# Patient Record
Sex: Female | Born: 1965 | Race: Black or African American | Hispanic: No | Smoking: Never smoker
Health system: Southern US, Community
[De-identification: ages and names within clinical notes are randomized; demographics above are authoritative.]

## PROBLEM LIST (undated history)

## (undated) DIAGNOSIS — E119 Type 2 diabetes mellitus without complications: Secondary | ICD-10-CM

## (undated) DIAGNOSIS — M549 Dorsalgia, unspecified: Secondary | ICD-10-CM

## (undated) DIAGNOSIS — R001 Bradycardia, unspecified: Secondary | ICD-10-CM

## (undated) DIAGNOSIS — I10 Essential (primary) hypertension: Secondary | ICD-10-CM

## (undated) DIAGNOSIS — G8929 Other chronic pain: Secondary | ICD-10-CM

## (undated) DIAGNOSIS — K219 Gastro-esophageal reflux disease without esophagitis: Secondary | ICD-10-CM

## (undated) DIAGNOSIS — E876 Hypokalemia: Secondary | ICD-10-CM

## (undated) HISTORY — PX: HERNIA REPAIR: SHX51

## (undated) HISTORY — DX: Gastro-esophageal reflux disease without esophagitis: K21.9

## (undated) HISTORY — PX: OTHER SURGICAL HISTORY: SHX169

## (undated) HISTORY — PX: APPENDECTOMY: SHX54

## (undated) HISTORY — PX: ESOPHAGOGASTRODUODENOSCOPY: SHX1529

## (undated) HISTORY — PX: BACK SURGERY: SHX140

---

## 2016-03-05 DIAGNOSIS — Z981 Arthrodesis status: Secondary | ICD-10-CM | POA: Insufficient documentation

## 2016-03-05 DIAGNOSIS — G8929 Other chronic pain: Secondary | ICD-10-CM | POA: Insufficient documentation

## 2016-03-05 DIAGNOSIS — E6609 Other obesity due to excess calories: Secondary | ICD-10-CM | POA: Insufficient documentation

## 2016-09-19 DIAGNOSIS — F333 Major depressive disorder, recurrent, severe with psychotic symptoms: Secondary | ICD-10-CM | POA: Insufficient documentation

## 2016-09-19 DIAGNOSIS — N83201 Unspecified ovarian cyst, right side: Secondary | ICD-10-CM | POA: Insufficient documentation

## 2016-09-19 DIAGNOSIS — F319 Bipolar disorder, unspecified: Secondary | ICD-10-CM | POA: Insufficient documentation

## 2016-09-19 DIAGNOSIS — F250 Schizoaffective disorder, bipolar type: Secondary | ICD-10-CM | POA: Insufficient documentation

## 2016-12-10 DIAGNOSIS — Z9889 Other specified postprocedural states: Secondary | ICD-10-CM | POA: Insufficient documentation

## 2016-12-10 DIAGNOSIS — R102 Pelvic and perineal pain: Secondary | ICD-10-CM | POA: Insufficient documentation

## 2016-12-10 DIAGNOSIS — N809 Endometriosis, unspecified: Secondary | ICD-10-CM | POA: Insufficient documentation

## 2016-12-10 DIAGNOSIS — N941 Unspecified dyspareunia: Secondary | ICD-10-CM | POA: Insufficient documentation

## 2017-01-14 DIAGNOSIS — R809 Proteinuria, unspecified: Secondary | ICD-10-CM | POA: Insufficient documentation

## 2017-04-24 DIAGNOSIS — Z780 Asymptomatic menopausal state: Secondary | ICD-10-CM | POA: Insufficient documentation

## 2017-07-11 DIAGNOSIS — E559 Vitamin D deficiency, unspecified: Secondary | ICD-10-CM | POA: Insufficient documentation

## 2017-12-09 DIAGNOSIS — E119 Type 2 diabetes mellitus without complications: Secondary | ICD-10-CM | POA: Diagnosis not present

## 2017-12-24 ENCOUNTER — Emergency Department (HOSPITAL_COMMUNITY): Payer: Medicare Other

## 2017-12-24 ENCOUNTER — Emergency Department (HOSPITAL_COMMUNITY)
Admission: EM | Admit: 2017-12-24 | Discharge: 2017-12-24 | Disposition: A | Discharge status: Home / Self Care | Payer: Medicare Other | Attending: Emergency Medicine | Admitting: Emergency Medicine

## 2017-12-24 ENCOUNTER — Encounter (HOSPITAL_COMMUNITY): Payer: Self-pay | Admitting: Emergency Medicine

## 2017-12-24 DIAGNOSIS — G8929 Other chronic pain: Secondary | ICD-10-CM

## 2017-12-24 DIAGNOSIS — I10 Essential (primary) hypertension: Secondary | ICD-10-CM | POA: Diagnosis not present

## 2017-12-24 DIAGNOSIS — E119 Type 2 diabetes mellitus without complications: Secondary | ICD-10-CM | POA: Insufficient documentation

## 2017-12-24 DIAGNOSIS — M549 Dorsalgia, unspecified: Secondary | ICD-10-CM | POA: Diagnosis present

## 2017-12-24 DIAGNOSIS — M545 Low back pain: Secondary | ICD-10-CM | POA: Insufficient documentation

## 2017-12-24 HISTORY — DX: Essential (primary) hypertension: I10

## 2017-12-24 HISTORY — DX: Dorsalgia, unspecified: M54.9

## 2017-12-24 HISTORY — DX: Other chronic pain: G89.29

## 2017-12-24 HISTORY — DX: Type 2 diabetes mellitus without complications: E11.9

## 2017-12-24 LAB — COMPREHENSIVE METABOLIC PANEL
ALT: 18 U/L (ref 14–54)
AST: 21 U/L (ref 15–41)
Albumin: 4.4 g/dL (ref 3.5–5.0)
Alkaline Phosphatase: 77 U/L (ref 38–126)
Anion gap: 9 (ref 5–15)
BUN: 5 mg/dL — ABNORMAL LOW (ref 6–20)
CO2: 28 mmol/L (ref 22–32)
Calcium: 9.6 mg/dL (ref 8.9–10.3)
Chloride: 101 mmol/L (ref 101–111)
Creatinine, Ser: 0.75 mg/dL (ref 0.44–1.00)
GFR calc Af Amer: >60 mL/min (ref >60)
GFR calc non Af Amer: >60 mL/min (ref >60)
Glucose, Bld: 87 mg/dL (ref 65–99)
Potassium: 3.4 mmol/L — ABNORMAL LOW (ref 3.5–5.1)
Sodium: 138 mmol/L (ref 135–145)
Total Bilirubin: 0.6 mg/dL (ref 0.3–1.2)
Total Protein: 8.1 g/dL (ref 6.5–8.1)

## 2017-12-24 LAB — URINALYSIS, ROUTINE W REFLEX MICROSCOPIC
Bacteria, UA: NONE SEEN
Bilirubin Urine: NEGATIVE
Glucose, UA: NEGATIVE mg/dL
Ketones, ur: NEGATIVE mg/dL
Leukocytes, UA: NEGATIVE
Nitrite: NEGATIVE
Protein, ur: NEGATIVE mg/dL
Specific Gravity, Urine: 1.005 (ref 1.005–1.030)
pH: 7 (ref 5.0–8.0)

## 2017-12-24 LAB — CBC WITH DIFFERENTIAL/PLATELET
Abs Immature Granulocytes: 0 10*3/uL (ref 0.0–0.1)
Basophils Absolute: 0.1 10*3/uL (ref 0.0–0.1)
Basophils Relative: 1 %
Eosinophils Absolute: 0.3 10*3/uL (ref 0.0–0.7)
Eosinophils Relative: 5 %
HCT: 43.3 % (ref 36.0–46.0)
Hemoglobin: 13.9 g/dL (ref 12.0–15.0)
Immature Granulocytes: 0 %
Lymphocytes Relative: 54 %
Lymphs Abs: 3.1 10*3/uL (ref 0.7–4.0)
MCH: 27.5 pg (ref 26.0–34.0)
MCHC: 32.1 g/dL (ref 30.0–36.0)
MCV: 85.7 fL (ref 78.0–100.0)
Monocytes Absolute: 0.3 10*3/uL (ref 0.1–1.0)
Monocytes Relative: 6 %
Neutro Abs: 1.9 10*3/uL (ref 1.7–7.7)
Neutrophils Relative %: 34 %
Platelets: 357 10*3/uL (ref 150–400)
RBC: 5.05 MIL/uL (ref 3.87–5.11)
RDW: 13.6 % (ref 11.5–15.5)
WBC: 5.7 10*3/uL (ref 4.0–10.5)

## 2017-12-24 LAB — I-STAT BETA HCG BLOOD, ED (MC, WL, AP ONLY): I-stat hCG, quantitative: <5 m[IU]/mL (ref <5)

## 2017-12-24 MED ORDER — HYDROMORPHONE HCL 2 MG/ML IJ SOLN
1.0000 mg | Freq: Once | INTRAMUSCULAR | Status: AC
  Administered 2017-12-24: 1 mg via INTRAVENOUS
  Filled 2017-12-24: qty 1

## 2017-12-24 MED ORDER — ACETAMINOPHEN 325 MG PO TABS
650.0000 mg | ORAL_TABLET | Freq: Once | ORAL | Status: AC
  Administered 2017-12-24: 650 mg via ORAL
  Filled 2017-12-24: qty 2

## 2017-12-24 MED ORDER — HYDROMORPHONE HCL 2 MG/ML IJ SOLN
2.0000 mg | Freq: Once | INTRAMUSCULAR | Status: AC
  Administered 2017-12-24: 2 mg via INTRAVENOUS
  Filled 2017-12-24: qty 1
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
## 2017-12-24 NOTE — ED Notes (Signed)
ED Provider at bedside. 

## 2017-12-24 NOTE — ED Triage Notes (Signed)
Patient from home with [facility] with complaint of chronic back pain. Patient has an implant that provides her with fentanyl that was refilled yesterday. Patient states pain started two days ago and has gotten progressively worse.   170/80 100% on room air HR 60 CBG 90

## 2017-12-24 NOTE — ED Notes (Signed)
Placed purewick on pt. 

## 2017-12-24 NOTE — Discharge Instructions (Addendum)
Please follow-up with Dr. Leandra Kern as planned on Friday.  Resume taking your medications at home.  Please return to the emergency department if you develop any new or worsening symptoms including complete numbness of your groin, loss of bowel or bladder control.

## 2018-01-03 ENCOUNTER — Inpatient Hospital Stay (HOSPITAL_COMMUNITY)
Admission: EM | Admit: 2018-01-03 | Discharge: 2018-01-06 | DRG: 093 | Disposition: A | Discharge status: Home / Self Care | Payer: Medicare Other | Attending: Family Medicine | Admitting: Family Medicine

## 2018-01-03 ENCOUNTER — Encounter (HOSPITAL_COMMUNITY): Payer: Self-pay

## 2018-01-03 DIAGNOSIS — Z79899 Other long term (current) drug therapy: Secondary | ICD-10-CM

## 2018-01-03 DIAGNOSIS — W11XXXS Fall on and from ladder, sequela: Secondary | ICD-10-CM | POA: Diagnosis present

## 2018-01-03 DIAGNOSIS — Z7982 Long term (current) use of aspirin: Secondary | ICD-10-CM

## 2018-01-03 DIAGNOSIS — M549 Dorsalgia, unspecified: Secondary | ICD-10-CM | POA: Diagnosis present

## 2018-01-03 DIAGNOSIS — R51 Headache: Secondary | ICD-10-CM | POA: Diagnosis present

## 2018-01-03 DIAGNOSIS — J45909 Unspecified asthma, uncomplicated: Secondary | ICD-10-CM | POA: Diagnosis present

## 2018-01-03 DIAGNOSIS — T85615A Breakdown (mechanical) of other nervous system device, implant or graft, initial encounter: Principal | ICD-10-CM | POA: Diagnosis present

## 2018-01-03 DIAGNOSIS — R202 Paresthesia of skin: Secondary | ICD-10-CM | POA: Diagnosis present

## 2018-01-03 DIAGNOSIS — R262 Difficulty in walking, not elsewhere classified: Secondary | ICD-10-CM | POA: Diagnosis present

## 2018-01-03 DIAGNOSIS — M545 Low back pain: Secondary | ICD-10-CM | POA: Diagnosis not present

## 2018-01-03 DIAGNOSIS — Z793 Long term (current) use of hormonal contraceptives: Secondary | ICD-10-CM

## 2018-01-03 DIAGNOSIS — Y838 Other surgical procedures as the cause of abnormal reaction of the patient, or of later complication, without mention of misadventure at the time of the procedure: Secondary | ICD-10-CM | POA: Diagnosis present

## 2018-01-03 DIAGNOSIS — Z79891 Long term (current) use of opiate analgesic: Secondary | ICD-10-CM

## 2018-01-03 DIAGNOSIS — G8929 Other chronic pain: Secondary | ICD-10-CM | POA: Diagnosis present

## 2018-01-03 DIAGNOSIS — Z7984 Long term (current) use of oral hypoglycemic drugs: Secondary | ICD-10-CM

## 2018-01-03 DIAGNOSIS — E119 Type 2 diabetes mellitus without complications: Secondary | ICD-10-CM

## 2018-01-03 DIAGNOSIS — I10 Essential (primary) hypertension: Secondary | ICD-10-CM | POA: Diagnosis present

## 2018-01-03 DIAGNOSIS — R40241 Glasgow coma scale score 13-15, unspecified time: Secondary | ICD-10-CM | POA: Diagnosis present

## 2018-01-03 DIAGNOSIS — R110 Nausea: Secondary | ICD-10-CM | POA: Diagnosis present

## 2018-01-03 LAB — CBC WITH DIFFERENTIAL/PLATELET
Abs Immature Granulocytes: 0 10*3/uL (ref 0.0–0.1)
Basophils Absolute: 0 10*3/uL (ref 0.0–0.1)
Basophils Relative: 1 %
Eosinophils Absolute: 0.1 10*3/uL (ref 0.0–0.7)
Eosinophils Relative: 2 %
HCT: 43.9 % (ref 36.0–46.0)
Hemoglobin: 14.3 g/dL (ref 12.0–15.0)
Immature Granulocytes: 0 %
Lymphocytes Relative: 44 %
Lymphs Abs: 3.2 10*3/uL (ref 0.7–4.0)
MCH: 27.6 pg (ref 26.0–34.0)
MCHC: 32.6 g/dL (ref 30.0–36.0)
MCV: 84.7 fL (ref 78.0–100.0)
Monocytes Absolute: 0.4 10*3/uL (ref 0.1–1.0)
Monocytes Relative: 5 %
Neutro Abs: 3.7 10*3/uL (ref 1.7–7.7)
Neutrophils Relative %: 50 %
Platelets: 307 10*3/uL (ref 150–400)
RBC: 5.18 MIL/uL — ABNORMAL HIGH (ref 3.87–5.11)
RDW: 13.6 % (ref 11.5–15.5)
WBC: 7.4 10*3/uL (ref 4.0–10.5)

## 2018-01-03 LAB — BASIC METABOLIC PANEL
Anion gap: 13 (ref 5–15)
BUN: 11 mg/dL (ref 6–20)
CO2: 24 mmol/L (ref 22–32)
Calcium: 9.7 mg/dL (ref 8.9–10.3)
Chloride: 104 mmol/L (ref 101–111)
Creatinine, Ser: 0.69 mg/dL (ref 0.44–1.00)
GFR calc Af Amer: >60 mL/min (ref >60)
GFR calc non Af Amer: >60 mL/min (ref >60)
Glucose, Bld: 82 mg/dL (ref 65–99)
Potassium: 4 mmol/L (ref 3.5–5.1)
Sodium: 141 mmol/L (ref 135–145)

## 2018-01-03 LAB — URINALYSIS, ROUTINE W REFLEX MICROSCOPIC
Bilirubin Urine: NEGATIVE
Glucose, UA: NEGATIVE mg/dL
Hgb urine dipstick: NEGATIVE
Ketones, ur: NEGATIVE mg/dL
Nitrite: NEGATIVE
Protein, ur: NEGATIVE mg/dL
Specific Gravity, Urine: 1.006 (ref 1.005–1.030)
pH: 7 (ref 5.0–8.0)

## 2018-01-03 MED ORDER — METOCLOPRAMIDE HCL 5 MG/ML IJ SOLN
10.0000 mg | Freq: Once | INTRAMUSCULAR | Status: AC
  Administered 2018-01-03: 10 mg via INTRAVENOUS
  Filled 2018-01-03: qty 2

## 2018-01-03 MED ORDER — HYDROMORPHONE HCL 2 MG/ML IJ SOLN
2.0000 mg | Freq: Once | INTRAMUSCULAR | Status: AC
  Administered 2018-01-03: 2 mg via INTRAVENOUS
  Filled 2018-01-03: qty 1

## 2018-01-03 MED ORDER — SODIUM CHLORIDE 0.9 % IV BOLUS
1000.0000 mL | Freq: Once | INTRAVENOUS | Status: AC
  Administered 2018-01-03: 1000 mL via INTRAVENOUS

## 2018-01-03 MED ORDER — HYDROMORPHONE HCL 2 MG/ML IJ SOLN
1.0000 mg | Freq: Once | INTRAMUSCULAR | Status: AC
Start: 2018-01-03 — End: 2018-01-03
  Administered 2018-01-03: 1 mg via INTRAVENOUS
  Filled 2018-01-03: qty 1

## 2018-01-03 MED ORDER — ONDANSETRON HCL 4 MG/2ML IJ SOLN
4.0000 mg | Freq: Once | INTRAMUSCULAR | Status: AC
  Administered 2018-01-03: 4 mg via INTRAVENOUS
  Filled 2018-01-03: qty 2

## 2018-01-03 MED ORDER — HYDROMORPHONE HCL 2 MG/ML IJ SOLN
1.0000 mg | Freq: Once | INTRAMUSCULAR | Status: AC
  Administered 2018-01-03: 1 mg via INTRAVENOUS
  Filled 2018-01-03: qty 1

## 2018-01-03 NOTE — ED Notes (Signed)
Pt c/o a headache  She reports that all the pain med she has been given has not helped her headache

## 2018-01-03 NOTE — ED Notes (Signed)
Pt called out and is requesting medication for nausea.
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
## 2018-01-03 NOTE — ED Notes (Signed)
Admitting doctor has been in to see the pt

## 2018-01-03 NOTE — ED Notes (Signed)
Pain no better more pain med given

## 2018-01-03 NOTE — ED Notes (Signed)
Pt resting daughter at the bedside reports that she is no better

## 2018-01-03 NOTE — ED Triage Notes (Signed)
To room via EMS.  Pt has chronic back pain, has implanted Fentanyl pump.  Onset 2 weeks pain is worsening. Pt taking Dilaudid for breakthrough pain, last dose 2:30am.

## 2018-01-03 NOTE — ED Provider Notes (Signed)
[facility] EMERGENCY DEPARTMENT Provider Note   CSN: [id number] Arrival date & time: 01/03/18  1344     History   Chief Complaint Chief Complaint  Patient presents with  . Back Pain    HPI Linda Mercado is a 52 y.o. female.  She is a history of chronic pain and is been dealing with this since 2008 when she fell off a ladder and broke her spine.  She is had multiple back surgeries since then and currently has an intrathecal fentanyl pump.  She follows with Dr. Wilburt Finlay from pain management here.  She was here about 10 days ago for intractable pain not controlled by her regular medications and ended up getting multiple doses of IV Dilaudid for breakthrough pain.  The patient here today is moaning in pain and her family member is present in the history that she has had worsening pain that goes from her head to her feet through her whole back and down her legs.  The patient herself speaks with a very quiet voice and will answer very brief questions.  She denies any fever and is only concerned about the worsening of her 10 out of 10 pain.  There is been no new numbness or weakness.  The history is provided by the patient and a relative. The history is limited by the condition of the patient.  Back Pain   This is a chronic problem. The problem occurs constantly. The problem has been gradually worsening. The pain is associated with no known injury. The pain is present in the thoracic spine, lumbar spine, sacro-iliac joint and gluteal region. The quality of the pain is described as stabbing. The pain radiates to the left thigh, left knee, left foot, right thigh, right knee and right foot. The pain is at a severity of 10/10. The pain is severe. The pain is the same all the time. Associated symptoms include headaches, abdominal pain, paresthesias and weakness. Pertinent negatives include no chest pain, no fever, no bowel incontinence, no perianal numbness, no bladder incontinence and no  dysuria. She has tried analgesics for the symptoms. The treatment provided no relief.    Past Medical History:  Diagnosis Date  . Chronic back pain   . Diabetes mellitus without complication ([place])   . Hypertension     There are no active problems to display for this patient.   History reviewed. No pertinent surgical history.   OB History   None      Home Medications    Prior to Admission medications   Medication Sig Start Date End Date Taking? Authorizing Provider  aspirin EC 81 MG tablet Take 81 mg by mouth daily.   Yes [provider]  atenolol (TENORMIN) 100 MG tablet Take 100 mg by mouth daily.   Yes [provider]  clonazePAM (KLONOPIN) 1 MG tablet Take 1 mg by mouth 3 (three) times daily.   Yes [provider]  etodolac (LODINE) 400 MG tablet Take 400 mg by mouth 2 (two) times daily.   Yes [provider]  hydrochlorothiazide (MICROZIDE) 12.5 MG capsule Take 12.5 mg by mouth daily.   Yes [provider]  hydrOXYzine (ATARAX/VISTARIL) 25 MG tablet Take 25 mg by mouth every 6 (six) hours as needed for itching.   Yes [provider]  losartan (COZAAR) 25 MG tablet Take 25 mg by mouth daily.   Yes [provider]  metFORMIN (GLUCOPHAGE) 500 MG tablet Take 500 mg by mouth 2 (two)  times daily with a meal.   Yes [provider]  Norgestimate-Ethinyl Estradiol Triphasic (TRI-ESTARYLLA) 0.18/0.215/0.25 MG-35 MCG tablet Take 1 tablet by mouth daily.   Yes [provider]  ondansetron (ZOFRAN) 4 MG tablet Take 4 mg by mouth every 6 (six) hours as needed for nausea or vomiting.   Yes [provider]  PRESCRIPTION MEDICATION Fentanyl pump- cont   Yes [provider]  promethazine (PHENERGAN) 25 MG tablet Take 25 mg by mouth every 6 (six) hours as needed for nausea or vomiting.   Yes [provider]  QUEtiapine (SEROQUEL XR) 400 MG 24 hr tablet Take 400 mg by mouth 2 (two) times  daily.   Yes [provider]  tiZANidine (ZANAFLEX) 4 MG tablet Take 4 mg by mouth every 8 (eight) hours as needed for muscle spasms.   Yes [provider]  venlafaxine XR (EFFEXOR-XR) 150 MG 24 hr capsule Take 150 mg by mouth daily with breakfast.   Yes [provider]  oxyCODONE (OXY IR/ROXICODONE) 5 MG immediate release tablet Take 5 mg by mouth 4 (four) times daily. 12/30/17   [provider]    Family History History reviewed. No pertinent family history.  Social History Social History   Tobacco Use  . Smoking status: Never Smoker  . Smokeless tobacco: Never Used  Substance Use Topics  . Alcohol use: Not Currently    Frequency: Never  . Drug use: Never     Allergies   Patient has no known allergies.   Review of Systems Review of Systems  Constitutional: Negative for fever.  HENT: Negative for sore throat.   Eyes: Negative for visual disturbance.  Respiratory: Negative for shortness of breath.   Cardiovascular: Negative for chest pain.  Gastrointestinal: Positive for abdominal pain. Negative for bowel incontinence.  Genitourinary: Negative for bladder incontinence and dysuria.  Musculoskeletal: Positive for arthralgias, back pain, myalgias and neck pain.  Skin: Negative for rash.  Neurological: Positive for weakness, headaches and paresthesias.     Physical Exam Updated Vital Signs BP (!) 165/89   Pulse (!) 58   Temp (!) 97.5 F (36.4 C) (Oral)   Resp 16   LMP 12/01/2017   SpO2 100%   Physical Exam  Constitutional: She appears well-developed and well-nourished.  HENT:  Head: Normocephalic and atraumatic.  Right Ear: External ear normal.  Left Ear: External ear normal.  Nose: Nose normal.  Mouth/Throat: Oropharynx is clear and moist.  Eyes: Conjunctivae are normal.  Neck: Neck supple.  Cardiovascular: Normal rate, regular rhythm, normal heart sounds and intact distal pulses.  Pulmonary/Chest: Effort normal and breath  sounds normal. She has no wheezes. She has no rales.  Abdominal: Soft. There is tenderness (rlq over pump). There is no guarding.  Musculoskeletal: She exhibits tenderness. She exhibits no edema or deformity.  Neurological: She is alert. GCS eye subscore is 4. GCS verbal subscore is 5. GCS motor subscore is 6.  Skin: Skin is warm and dry. Capillary refill takes less than 2 seconds.  Psychiatric: She has a normal mood and affect.     ED Treatments / Results  Labs (all labs ordered are listed, but only abnormal results are displayed) Labs Reviewed  CBC WITH DIFFERENTIAL/PLATELET - Abnormal; Notable for the following components:      Result Value   RBC 5.18 (*)    All other components within normal limits  URINALYSIS, ROUTINE W REFLEX MICROSCOPIC - Abnormal; Notable for the following components:   Leukocytes, UA MODERATE (*)  Bacteria, UA RARE (*)    All other components within normal limits  BASIC METABOLIC PANEL - Abnormal; Notable for the following components:   Potassium 3.4 (*)    Glucose, Bld 124 (*)    Calcium 8.8 (*)    All other components within normal limits  RAPID URINE DRUG SCREEN, HOSP PERFORMED - Abnormal; Notable for the following components:   Opiates POSITIVE (*)    Barbiturates   (*)    Value: Result not available. Reagent lot number recalled by manufacturer.   All other components within normal limits  GLUCOSE, CAPILLARY - Abnormal; Notable for the following components:   Glucose-Capillary 156 (*)    All other components within normal limits  GLUCOSE, CAPILLARY - Abnormal; Notable for the following components:   Glucose-Capillary 146 (*)    All other components within normal limits  BASIC METABOLIC PANEL  CBC  GLUCOSE, CAPILLARY  GLUCOSE, CAPILLARY  GLUCOSE, CAPILLARY  GLUCOSE, CAPILLARY  HIV ANTIBODY (ROUTINE TESTING)    EKG None  Radiology No results found.  Procedures Procedures (including critical care time)  Medications Ordered in  ED Medications  HYDROmorphone (DILAUDID) injection 2 mg (has no administration in time range)     Initial Impression / Assessment and Plan / ED Course  I have reviewed the triage vital signs and the nursing notes.  Pertinent labs & imaging results that were available during my care of the patient were reviewed by me and considered in my medical decision making (see chart for details).  Clinical Course as of Jan 05 1025  Sat Jan 03, 6169  1536 52 year old female with acute on chronic pain.  Per family member she is supposed to go see a Psychologist, sport and exercise at [facility] on Tuesday regarding changing the pump because its in the wrong position.  On review of prior notes she was here 10 days ago and the pump was interrogated and seemed to be functioning appropriately.  Family members asking [place] to talk to Dr. Angie Fava who instructed them to bring her here if she was having any problems.   [MB]  0539 Her current pain management is a fentanyl pump and oxycodone 5 mg every 4 as needed pain.   [MB]  1553 Attempted to reach Dr. Angie Fava -  pain management but his office was closed for the weekend and there is no on-call service.   [MB]  7673 After first dose of pain medicine not any real significant relief.  Family members concern is her time to take her home she is right back here with pain.  We will try a few  doses of pain medicine to see whether or not she is agreeable for that.   [MB]  4193 Reevaluated patient states she is feeling a little less nauseous and a little better pain control.  We will order a repeat dose of each now.   [MB]  2006 Back pain somewhat improved but complaining of headache.   [MB]    Clinical Course User Index [MB] Hayden Rasmussen, MD     Final Clinical Impressions(s) / ED Diagnoses   Final diagnoses:  Chronic low back pain, unspecified back pain laterality, with sciatica presence unspecified    ED Discharge Orders    None       Hayden Rasmussen, MD 01/05/18 1027

## 2018-01-04 ENCOUNTER — Other Ambulatory Visit: Payer: Self-pay

## 2018-01-04 ENCOUNTER — Encounter (HOSPITAL_COMMUNITY): Payer: Self-pay | Admitting: *Deleted

## 2018-01-04 DIAGNOSIS — R262 Difficulty in walking, not elsewhere classified: Secondary | ICD-10-CM | POA: Diagnosis present

## 2018-01-04 DIAGNOSIS — G894 Chronic pain syndrome: Secondary | ICD-10-CM

## 2018-01-04 DIAGNOSIS — I10 Essential (primary) hypertension: Secondary | ICD-10-CM | POA: Diagnosis not present

## 2018-01-04 DIAGNOSIS — Z79891 Long term (current) use of opiate analgesic: Secondary | ICD-10-CM | POA: Diagnosis not present

## 2018-01-04 DIAGNOSIS — Z7984 Long term (current) use of oral hypoglycemic drugs: Secondary | ICD-10-CM | POA: Diagnosis not present

## 2018-01-04 DIAGNOSIS — R202 Paresthesia of skin: Secondary | ICD-10-CM | POA: Diagnosis present

## 2018-01-04 DIAGNOSIS — M549 Dorsalgia, unspecified: Secondary | ICD-10-CM | POA: Diagnosis not present

## 2018-01-04 DIAGNOSIS — R110 Nausea: Secondary | ICD-10-CM | POA: Diagnosis present

## 2018-01-04 DIAGNOSIS — Z793 Long term (current) use of hormonal contraceptives: Secondary | ICD-10-CM | POA: Diagnosis not present

## 2018-01-04 DIAGNOSIS — M545 Low back pain: Secondary | ICD-10-CM | POA: Diagnosis present

## 2018-01-04 DIAGNOSIS — R51 Headache: Secondary | ICD-10-CM | POA: Diagnosis present

## 2018-01-04 DIAGNOSIS — E119 Type 2 diabetes mellitus without complications: Secondary | ICD-10-CM | POA: Diagnosis not present

## 2018-01-04 DIAGNOSIS — R40241 Glasgow coma scale score 13-15, unspecified time: Secondary | ICD-10-CM | POA: Diagnosis present

## 2018-01-04 DIAGNOSIS — T85615A Breakdown (mechanical) of other nervous system device, implant or graft, initial encounter: Secondary | ICD-10-CM | POA: Diagnosis present

## 2018-01-04 DIAGNOSIS — Z7982 Long term (current) use of aspirin: Secondary | ICD-10-CM | POA: Diagnosis not present

## 2018-01-04 DIAGNOSIS — W11XXXS Fall on and from ladder, sequela: Secondary | ICD-10-CM | POA: Diagnosis present

## 2018-01-04 DIAGNOSIS — Z79899 Other long term (current) drug therapy: Secondary | ICD-10-CM | POA: Diagnosis not present

## 2018-01-04 DIAGNOSIS — Y838 Other surgical procedures as the cause of abnormal reaction of the patient, or of later complication, without mention of misadventure at the time of the procedure: Secondary | ICD-10-CM | POA: Diagnosis present

## 2018-01-04 DIAGNOSIS — G8929 Other chronic pain: Secondary | ICD-10-CM | POA: Diagnosis present

## 2018-01-04 DIAGNOSIS — Z9889 Other specified postprocedural states: Secondary | ICD-10-CM | POA: Diagnosis not present

## 2018-01-04 DIAGNOSIS — J45909 Unspecified asthma, uncomplicated: Secondary | ICD-10-CM | POA: Diagnosis present

## 2018-01-04 LAB — GLUCOSE, CAPILLARY
Glucose-Capillary: 77 mg/dL (ref 65–99)
Glucose-Capillary: 91 mg/dL (ref 65–99)
Glucose-Capillary: 87 mg/dL (ref 65–99)
Glucose-Capillary: 156 mg/dL — ABNORMAL HIGH (ref 65–99)
Glucose-Capillary: 146 mg/dL — ABNORMAL HIGH (ref 65–99)

## 2018-01-04 LAB — CBC
HCT: 39.1 % (ref 36.0–46.0)
Hemoglobin: 12.3 g/dL (ref 12.0–15.0)
MCH: 27.3 pg (ref 26.0–34.0)
MCHC: 31.5 g/dL (ref 30.0–36.0)
MCV: 86.7 fL (ref 78.0–100.0)
Platelets: 256 10*3/uL (ref 150–400)
RBC: 4.51 MIL/uL (ref 3.87–5.11)
RDW: 13.7 % (ref 11.5–15.5)
WBC: 8.2 10*3/uL (ref 4.0–10.5)

## 2018-01-04 LAB — BASIC METABOLIC PANEL
Anion gap: 10 (ref 5–15)
BUN: 11 mg/dL (ref 6–20)
CO2: 25 mmol/L (ref 22–32)
Calcium: 8.8 mg/dL — ABNORMAL LOW (ref 8.9–10.3)
Chloride: 103 mmol/L (ref 101–111)
Creatinine, Ser: 0.75 mg/dL (ref 0.44–1.00)
GFR calc Af Amer: >60 mL/min (ref >60)
GFR calc non Af Amer: >60 mL/min (ref >60)
Glucose, Bld: 124 mg/dL — ABNORMAL HIGH (ref 65–99)
Potassium: 3.4 mmol/L — ABNORMAL LOW (ref 3.5–5.1)
Sodium: 138 mmol/L (ref 135–145)

## 2018-01-04 MED ORDER — HYDROMORPHONE HCL 2 MG/ML IJ SOLN
1.0000 mg | INTRAMUSCULAR | Status: DC | PRN
  Administered 2018-01-04: 1 mg via INTRAVENOUS
  Filled 2018-01-04: qty 1

## 2018-01-04 MED ORDER — ONDANSETRON HCL 4 MG/2ML IJ SOLN
4.0000 mg | Freq: Four times a day (QID) | INTRAMUSCULAR | Status: DC | PRN
  Administered 2018-01-04 – 2018-01-06 (×9): 4 mg via INTRAVENOUS
  Filled 2018-01-04 (×9): qty 2

## 2018-01-04 MED ORDER — LOSARTAN POTASSIUM 25 MG PO TABS
25.0000 mg | ORAL_TABLET | Freq: Every day | ORAL | Status: DC
  Administered 2018-01-04 – 2018-01-06 (×3): 25 mg via ORAL
  Filled 2018-01-04 (×3): qty 1

## 2018-01-04 MED ORDER — ATENOLOL 50 MG PO TABS
100.0000 mg | ORAL_TABLET | Freq: Every day | ORAL | Status: DC
  Administered 2018-01-04 – 2018-01-06 (×3): 100 mg via ORAL
  Filled 2018-01-04 (×3): qty 2

## 2018-01-04 MED ORDER — INSULIN ASPART 100 UNIT/ML ~~LOC~~ SOLN
0.0000 [IU] | Freq: Three times a day (TID) | SUBCUTANEOUS | Status: DC
  Administered 2018-01-04: 3 [IU] via SUBCUTANEOUS

## 2018-01-04 MED ORDER — KETOROLAC TROMETHAMINE 15 MG/ML IJ SOLN
15.0000 mg | Freq: Once | INTRAMUSCULAR | Status: AC
  Administered 2018-01-04: 15 mg via INTRAVENOUS
  Filled 2018-01-04: qty 1

## 2018-01-04 MED ORDER — OXYCODONE HCL 5 MG PO TABS
10.0000 mg | ORAL_TABLET | ORAL | Status: DC | PRN
  Administered 2018-01-04: 10 mg via ORAL
  Filled 2018-01-04 (×2): qty 2

## 2018-01-04 MED ORDER — HYDROXYZINE HCL 25 MG PO TABS
25.0000 mg | ORAL_TABLET | Freq: Four times a day (QID) | ORAL | Status: DC | PRN
Start: 2018-01-04 — End: 2018-01-06
  Administered 2018-01-04 – 2018-01-05 (×4): 25 mg via ORAL
  Filled 2018-01-04 (×4): qty 1

## 2018-01-04 MED ORDER — QUETIAPINE FUMARATE ER 400 MG PO TB24
400.0000 mg | ORAL_TABLET | Freq: Two times a day (BID) | ORAL | Status: DC
  Filled 2018-01-04 (×6): qty 1

## 2018-01-04 MED ORDER — ACETAMINOPHEN 325 MG PO TABS
650.0000 mg | ORAL_TABLET | Freq: Four times a day (QID) | ORAL | Status: DC | PRN
  Administered 2018-01-04: 650 mg via ORAL
  Filled 2018-01-04: qty 2

## 2018-01-04 MED ORDER — METFORMIN HCL 500 MG PO TABS
500.0000 mg | ORAL_TABLET | Freq: Two times a day (BID) | ORAL | Status: DC
  Filled 2018-01-04: qty 1

## 2018-01-04 MED ORDER — ASPIRIN EC 81 MG PO TBEC
81.0000 mg | DELAYED_RELEASE_TABLET | Freq: Every day | ORAL | Status: DC
  Administered 2018-01-06: 81 mg via ORAL
  Filled 2018-01-04 (×3): qty 1

## 2018-01-04 MED ORDER — HYDROMORPHONE HCL 1 MG/ML IJ SOLN
1.0000 mg | INTRAMUSCULAR | Status: DC | PRN
  Administered 2018-01-04 – 2018-01-05 (×4): 1 mg via INTRAVENOUS
  Filled 2018-01-04 (×4): qty 1

## 2018-01-04 MED ORDER — ENOXAPARIN SODIUM 40 MG/0.4ML ~~LOC~~ SOLN
40.0000 mg | SUBCUTANEOUS | Status: DC
  Filled 2018-01-04: qty 0.4

## 2018-01-04 MED ORDER — HYDROMORPHONE HCL 1 MG/ML IJ SOLN
1.0000 mg | INTRAMUSCULAR | Status: DC | PRN
  Administered 2018-01-04: 1 mg via INTRAVENOUS
  Filled 2018-01-04: qty 1

## 2018-01-04 MED ORDER — VENLAFAXINE HCL ER 150 MG PO CP24
150.0000 mg | ORAL_CAPSULE | Freq: Every day | ORAL | Status: DC
  Filled 2018-01-04 (×2): qty 1
  Filled 2018-01-04: qty 2
  Filled 2018-01-04: qty 1
  Filled 2018-01-04: qty 2

## 2018-01-04 MED ORDER — MAGNESIUM SULFATE 2 GM/50ML IV SOLN
2.0000 g | Freq: Once | INTRAVENOUS | Status: AC
  Administered 2018-01-04: 2 g via INTRAVENOUS
  Filled 2018-01-04: qty 50

## 2018-01-04 MED ORDER — INSULIN ASPART 100 UNIT/ML ~~LOC~~ SOLN: 0.0000 [IU] | Freq: Every day | SUBCUTANEOUS | Status: DC

## 2018-01-04 MED ORDER — HYDROCHLOROTHIAZIDE 12.5 MG PO CAPS
12.5000 mg | ORAL_CAPSULE | Freq: Every day | ORAL | Status: DC
  Administered 2018-01-04 – 2018-01-06 (×3): 12.5 mg via ORAL
  Filled 2018-01-04 (×4): qty 1

## 2018-01-04 MED ORDER — TIZANIDINE HCL 4 MG PO TABS
4.0000 mg | ORAL_TABLET | Freq: Three times a day (TID) | ORAL | Status: DC | PRN
  Administered 2018-01-04: 4 mg via ORAL
  Filled 2018-01-04: qty 1

## 2018-01-04 MED ORDER — METOCLOPRAMIDE HCL 5 MG/ML IJ SOLN
5.0000 mg | Freq: Once | INTRAMUSCULAR | Status: AC
  Administered 2018-01-04: 5 mg via INTRAVENOUS
  Filled 2018-01-04: qty 2

## 2018-01-04 MED ORDER — CLONAZEPAM 1 MG PO TABS
1.0000 mg | ORAL_TABLET | Freq: Three times a day (TID) | ORAL | Status: DC
  Administered 2018-01-04 – 2018-01-06 (×7): 1 mg via ORAL
  Filled 2018-01-04 (×7): qty 1

## 2018-01-04 MED ORDER — NORGESTIM-ETH ESTRAD TRIPHASIC 0.18/0.215/0.25 MG-35 MCG PO TABS: 1.0000 | ORAL_TABLET | Freq: Every day | ORAL | Status: DC

## 2018-01-04 NOTE — Progress Notes (Signed)
PROGRESS NOTE  Linda Mercado VQM:[id number] DOB: 12-Jul-1966 DOA: 01/03/2018 PCP: Alfredo Martinez, PA Pain Management: Dr. Angie Fava   Brief Narrative: 51 year old woman PMH chronic back pain, status post multiple surgeries, managed by pain pump, recently moved from [place].  Has establish care with anesthesiologist Dr. Angie Fava.  Was seen in the emergency department 6/5 for worsening pain, Dr. Angie Fava he weighed her at that time and recommended discharge home with outpatient follow-up.  Reportedly there is some abnormality with the implantation of her pump possibly loose screws and reportedly the patient has an appointment with a surgeon in [facility] for pump exchange for removal of screws.  In the interim however her pain became more severe in the hospital service was asked to admit the patient for pain control.  Assessment/Plan Intractable back pain with nausea, HA.  No evidence of complicating features. --Continue pain pump, continue oral and IV analgesics for pain as needed.   --Migraine cocktail --will contact Dr Angie Fava in AM  Essential hypertension --Stable.  Continue atenolol, hydrochlorothiazide, losartan  Diabetes mellitus type 2 --Hold metformin while hospitalized  Chronic pain --Continue oxycodone, tizanidine  DVT prophylaxis: SCDs Code Status: Full Family Communication: daughter at bedside Disposition Plan: home    Murray Hodgkins, MD  [facility] Direct contact: [phone] --[place] [place]  --[email or web address]; password [email or web address]  7PM-7AM contact night coverage as above 01/04/2018, 10:13 AM  LOS: 0 days   Consultants:    Procedures:    Antimicrobials:    Interval history/Subjective: Pt reports ongoing RLQ pain at pump site. Nausea for a few weeks. HA for a few weeks.  No problems with bowel or bladder.  No sensory disturbance.  Objective: Vitals:  Vitals:   01/04/18 0145 01/04/18 0520  BP: (!) 170/84 139/73  Pulse: 68 80  Resp:  18  Temp: 98.1  F (36.7 C) 98 F (36.7 C)  SpO2: 98% 99%    Exam:  Constitutional:  . Appears calm and comfortable. Keeps eyes closed during interview and lets daughter answer most questions Eyes:  . pupils and irises appear normal . Normal lids   ENMT:  . grossly normal hearing  . Lips appear normal Respiratory:  . CTA bilaterally, no w/r/r.  . Respiratory effort normal Cardiovascular:  . RRR, no m/r/g . No LE extremity edema   Abdomen:  . Soft, mild RLQ pain, nondistended Musculoskeletal:  . RUE, LUE, RLE, LLE   o strength and tone normal, no atrophy, no abnormal movements o No tenderness, masses o Lifts both legs off bed; effort appears submaximal Psychiatric:  . Mental status o Mood difficult to assess, affect odd  I have personally reviewed the following:   Labs:  Blood sugars stable.  Potassium 3.4, remainder BMP unremarkable  CBC unremarkable  Scheduled Meds: . aspirin EC  81 mg Oral Daily  . atenolol  100 mg Oral Daily  . clonazePAM  1 mg Oral TID  . enoxaparin (LOVENOX) injection  40 mg Subcutaneous Q24H  . hydrochlorothiazide  12.5 mg Oral Daily  . insulin aspart  0-15 Units Subcutaneous TID WC  . insulin aspart  0-5 Units Subcutaneous QHS  . losartan  25 mg Oral Daily  . metFORMIN  500 mg Oral BID WC  . Norgestimate-Ethinyl Estradiol Triphasic  1 tablet Oral Daily  . QUEtiapine  400 mg Oral BID  . venlafaxine XR  150 mg Oral Q breakfast   Continuous Infusions:  Principal Problem:   Intractable back pain Active Problems:   HTN (hypertension)  Type 2 diabetes mellitus without complication (H[facility])   LOS: 0 days

## 2018-01-04 NOTE — Plan of Care (Signed)
  Problem: Safety: Goal: Ability to remain free from injury will improve Outcome: Progressing   

## 2018-01-05 LAB — RAPID URINE DRUG SCREEN, HOSP PERFORMED
Amphetamines: NOT DETECTED
Benzodiazepines: NOT DETECTED
Cocaine: NOT DETECTED
Opiates: POSITIVE — AB
Tetrahydrocannabinol: NOT DETECTED

## 2018-01-05 LAB — GLUCOSE, CAPILLARY
Glucose-Capillary: 89 mg/dL (ref 65–99)
Glucose-Capillary: 120 mg/dL — ABNORMAL HIGH (ref 65–99)
Glucose-Capillary: 107 mg/dL — ABNORMAL HIGH (ref 65–99)
Glucose-Capillary: 113 mg/dL — ABNORMAL HIGH (ref 65–99)

## 2018-01-05 LAB — HIV ANTIBODY (ROUTINE TESTING W REFLEX): HIV Screen 4th Generation wRfx: NONREACTIVE

## 2018-01-05 MED ORDER — OXYCODONE HCL 5 MG PO TABS
10.0000 mg | ORAL_TABLET | ORAL | Status: DC | PRN
Start: 2018-01-05 — End: 2018-01-06
  Administered 2018-01-05 – 2018-01-06 (×2): 10 mg via ORAL
  Filled 2018-01-05 (×2): qty 2

## 2018-01-05 MED ORDER — HYDROMORPHONE HCL 1 MG/ML IJ SOLN
1.0000 mg | INTRAMUSCULAR | Status: DC | PRN
  Administered 2018-01-05 – 2018-01-06 (×4): 1 mg via INTRAVENOUS
  Filled 2018-01-05 (×5): qty 1

## 2018-01-05 NOTE — Progress Notes (Signed)
  PROGRESS NOTE  Linda Mercado [id number]:[id number] DOB: November 01, 1965 DOA: 01/03/2018 PCP: Alfredo Martinez, PA Pain Management: Dr. Angie Fava   Brief Narrative: 52 year old woman PMH chronic back pain, status post multiple surgeries, managed by pain pump, recently moved from [place].  Has establish care with anesthesiologist Dr. Angie Fava.  Was seen in the emergency department 6/5 for worsening pain, Dr. Angie Fava he weighed her at that time and recommended discharge home with outpatient follow-up.  Reportedly there is some abnormality with the implantation of her pump possibly loose screws and reportedly the patient has an appointment with a surgeon in [facility] for pump exchange for removal of screws.  In the interim however her pain became more severe in the hospital service was asked to admit the patient for pain control.  Assessment/Plan Intractable back pain with nausea, HA.  No evidence of complicating features. --pain better controlled with oral, IV analgesic --no concerning neuro features --discussed with Dr. Angie Fava, he recommends no further evaluation, can d/c when able and can f/u with him 6/18, he will interrogate pump again. Pump has been working fine as outpt on previous checks. Will continue pain pump, continue oral and IV analgesics for pain as needed.   --home in AM  Essential hypertension --remains stable. Continue atenolol, hydrochlorothiazide, losartan  Diabetes mellitus type 2. Hold metformin while hospitalized --CBG stable. Continue SSI.  Chronic pain --will continue oxycodone, tizanidine  DVT prophylaxis: SCDs Code Status: Full Family Communication: daughter at bedside 6/17 Disposition Plan: home    Murray Hodgkins, MD  [facility] Direct contact: [phone] --[address]ia [place]  --[email or web address]; password [email or web address]  7PM-7AM contact night coverage as above 01/05/2018, 10:20 AM  LOS: 1 day   Consultants:    Procedures:    Antimicrobials:    Interval  history/Subjective: HA better today. Pain controlled.   Objective: Vitals:  Vitals:   01/04/18 2041 01/05/18 0440  BP: 137/64 124/62  Pulse: (!) 58 (!) 51  Resp:  12  Temp: 97.8 F (36.6 C) 98.9 F (37.2 C)  SpO2: 100% 100%    Exam:  Constitutional:   . Appears calm and comfortable . Remains still as daughter spoon feeds her and later still as daughter rubs skin cream on her face Respiratory:  . CTA bilaterally, no w/r/r.  . Respiratory effort normal.  Cardiovascular:  . RRR, no m/r/g . No LE extremity edema   Abdomen:  . soft Musculoskeletal:  . RLE, LLE   o strength and tone normal, no atrophy, no abnormal movements o Does not appear to give full effort Psychiatric:  . Mental status Affect odd  I have personally reviewed the following:   Labs:  CBG stable  Scheduled Meds: . aspirin EC  81 mg Oral Daily  . atenolol  100 mg Oral Daily  . clonazePAM  1 mg Oral TID  . hydrochlorothiazide  12.5 mg Oral Daily  . insulin aspart  0-15 Units Subcutaneous TID WC  . insulin aspart  0-5 Units Subcutaneous QHS  . losartan  25 mg Oral Daily  . Norgestimate-Ethinyl Estradiol Triphasic  1 tablet Oral Daily  . QUEtiapine  400 mg Oral BID  . venlafaxine XR  150 mg Oral Q breakfast   Continuous Infusions:  Principal Problem:   Intractable back pain Active Problems:   HTN (hypertension)   Type 2 diabetes mellitus without complication ([place])   LOS: 1 day

## 2018-01-05 NOTE — Progress Notes (Signed)
PT Cancellation Note  Patient Details Name: Linda Mercado MRN: [id number] DOB: 1965-08-05   Cancelled Treatment:    Reason Eval/Treat Not Completed: Other (comment).  Pt has attempted to use BSC with nursing and cannot tolerate the activity.  Will try again tomorrow and may dc order if pt is just not able to do this, reorder if so when pt is able.   Ramond Dial 01/05/2018, 9:46 AM   Mee Hives, PT MS Acute Rehab Dept. Number: [place] and [place]

## 2018-01-05 NOTE — Progress Notes (Signed)
Nurse shared this patient is in terrible pain and preparing to be moved to [facility] for surgery due to loose screws/hardware in her body.   Provided calm presence and empathic listening.  Patient is in so much pain-looking for relief from the pain even it does mean surgery.  Prayed with patient and daughter for peace of mind and heart and relief from the pain and guidance for all the medical staff as they seek to help her with these issues.  Conard Novak, Chaplain   01/05/18 1500  Clinical Encounter Type  Visited With Patient and family together  Visit Type Initial;Spiritual support;Other (Comment) (patient in terrible pain)  Referral From Nurse  Consult/Referral To Chaplain  Spiritual Encounters  Spiritual Needs Prayer;Emotional  Stress Factors  Patient Stress Factors Other (Comment) (preparing for transfer to [facility] for surgery-hope to help)

## 2018-01-06 LAB — GLUCOSE, CAPILLARY: Glucose-Capillary: 96 mg/dL (ref 65–99)

## 2018-01-06 NOTE — Discharge Summary (Signed)
Physician Discharge Summary  Linda Mercado:[id number] DOB: 09-Sep-1965 DOA: 01/03/2018  PCP: Alfredo Martinez, PA  Admit date: 01/03/2018 Discharge date: 01/06/2018  Recommendations for Outpatient Follow-up:  1. Follow-up pain pump site 2. Follow-up chronic low back pain 3. Consider psychiatric evaluation for possible depression  Follow-up Information    Shanon Ace, MD Follow up.   Specialty:  Anesthesiology Why:  Call office, they are happy to see you today 6/18 or 6/19 to follow-up on pump. Contact information: [address] [place] [address] [phone]            Discharge Diagnoses:  1. Intractable back pain with nausea, HA 2. Essential hypertension 3. Diabetes mellitus type 2 4. Chronic pain  Discharge Condition: improved Disposition: home   Diet recommendation: heart healthy diabetic diet  Filed Weights   01/04/18 0539  Weight: 61.7 kg (136 lb)    History of present illness:  52 year old woman PMH chronic back pain, status post multiple surgeries, managed by pain pump, recently moved from [place].  Has established care with anesthesiologist Dr. Angie Fava.  Was seen in the emergency department 6/5 for worsening pain, Dr. Angie Fava he weighed her at that time and recommended discharge home with outpatient follow-up.  Reportedly there is some abnormality with the implantation of her pump possibly loose screws and reportedly the patient has an appointment with a surgeon in [facility] for pump exchange for removal of screws.  In the interim however her pain became more severe in the hospital service was asked to admit the patient for pain control.  Hospital Course:  Patient was observed, treated for acute on chronic pain with gradual clinical improvement.  No bowel or bladder dysfunction, no significant weakness or musculoskeletal issues noted.  No new neurologic symptoms noted.  No evidence of complicating features or acute issues.  Case  was discussed with her pain physician, see documentation below.  Headache improved with treatment, pain stabilized, plan for discharge with outpatient follow-up already arranged with her surgeon and can also follow-up with her pain management specialist in the next 24 hours.  Discussed with daughter at bedside 6/18.  Intractable back pain with nausea, HA.  There was no evidence of complicating features. --Pain controlled --discussed with Dr. Angie Fava 6/17, he recommended no further evaluation, can d/c when able and can f/u with him 6/18 or 6/19, he will interrogate pump again. Pump has been working fine as outpt on previous checks. Will continue pain pump, continue oral and IV analgesics for pain as needed.   --I discussed these recommendations with the patient and daughter before noon yesterday and advised him to call the office to arrange an appointment.  They have not yet called the office to arrange an appointment.  Essential hypertension --Stable. Continue atenolol, hydrochlorothiazide, losartan  Diabetes mellitus type 2. Hold metformin while hospitalized --Stable during hospitalization.  Resume metformin on discharge.  Chronic pain --Continue oxycodone, tizanidine  Consultants:  None  Procedures:  None  Today's assessment: S: HA improved. No change in pain at pump site. No bowel or bladder problems. Chronic feet tingling. O: Vitals:  Vitals:   01/05/18 2055 01/06/18 0619  BP: 132/65 106/63  Pulse: 61 (!) 51  Resp: 18 16  Temp: 98.1 F (36.7 C) 97.6 F (36.4 C)  SpO2: 100% 100%    Constitutional:  . Appears calm and comfortable Eyes:  . pupils and irises appear normal . Normal lids  ENMT:  . grossly normal hearing  Respiratory:  .  CTA bilaterally, no w/r/r.  . Respiratory effort normal Cardiovascular:  . RRR, no m/r/g . No LE extremity edema   Musculoskeletal:  RUE, LUE apper normal RLE, LLE tone and strength grossly normal, lifts both legs off the bed to  command, appears to give submaximal effort Skin:  . No rashes, lesions, ulcers Neurologic:  . Sensation lower extremities grossly intact Psychiatric:  . Mental status o Mood appears depressed.  Affect flat.  CBG stable  Discharge Instructions  Discharge Instructions    Diet - low sodium heart healthy   Complete by:  As directed    Diet Carb Modified   Complete by:  As directed    Discharge instructions   Complete by:  As directed    Call your physician or seek immediate medical attention for increased pain, numbness, weakness, bowel or bladder problems or worsening of condition.   Increase activity slowly   Complete by:  As directed      Allergies as of 01/06/2018   No Known Allergies     Medication List    TAKE these medications   aspirin EC 81 MG tablet Take 81 mg by mouth daily.   atenolol 100 MG tablet Commonly known as:  TENORMIN Take 100 mg by mouth daily.   clonazePAM 1 MG tablet Commonly known as:  KLONOPIN Take 1 mg by mouth 3 (three) times daily.   etodolac 400 MG tablet Commonly known as:  LODINE Take 400 mg by mouth 2 (two) times daily.   hydrochlorothiazide 12.5 MG capsule Commonly known as:  MICROZIDE Take 12.5 mg by mouth daily.   hydrOXYzine 25 MG tablet Commonly known as:  ATARAX/VISTARIL Take 25 mg by mouth every 6 (six) hours as needed for itching.   losartan 25 MG tablet Commonly known as:  COZAAR Take 25 mg by mouth daily.   metFORMIN 500 MG tablet Commonly known as:  GLUCOPHAGE Take 500 mg by mouth 2 (two) times daily with a meal.   ondansetron 4 MG tablet Commonly known as:  ZOFRAN Take 4 mg by mouth every 6 (six) hours as needed for nausea or vomiting.   oxyCODONE 5 MG immediate release tablet Commonly known as:  Oxy IR/ROXICODONE Take 5 mg by mouth 4 (four) times daily.   PRESCRIPTION MEDICATION Fentanyl pump- cont   promethazine 25 MG tablet Commonly known as:  PHENERGAN Take 25 mg by mouth every 6 (six) hours as  needed for nausea or vomiting.   QUEtiapine 400 MG 24 hr tablet Commonly known as:  SEROQUEL XR Take 400 mg by mouth 2 (two) times daily.   tiZANidine 4 MG tablet Commonly known as:  ZANAFLEX Take 4 mg by mouth every 8 (eight) hours as needed for muscle spasms.   TRI-ESTARYLLA 0.18/0.215/0.25 MG-35 MCG tablet Generic drug:  Norgestimate-Ethinyl Estradiol Triphasic Take 1 tablet by mouth daily.   venlafaxine XR 150 MG 24 hr capsule Commonly known as:  EFFEXOR-XR Take 150 mg by mouth daily with breakfast.      No Known Allergies  The results of significant diagnostics from this hospitalization (including imaging, microbiology, ancillary and laboratory) are listed below for reference.    Significant Diagnostic Studies:  Labs: Basic Metabolic Panel: Recent Labs  Lab 01/03/18 1554 01/04/18 0651  NA 141 138  K 4.0 3.4*  CL 104 103  CO2 24 25  GLUCOSE 82 124*  BUN 11 11  CREATININE 0.69 0.75  CALCIUM 9.7 8.8*   CBC: Recent Labs  Lab 01/03/18 1554 01/04/18  0651  WBC 7.4 8.2  NEUTROABS 3.7  --   HGB 14.3 12.3  HCT 43.9 39.1  MCV 84.7 86.7  PLT 307 256    CBG: Recent Labs  Lab 01/05/18 0825 01/05/18 1201 01/05/18 1714 01/05/18 2131 01/06/18 0755  GLUCAP 89 120* 107* 113* 96    Principal Problem:   Intractable back pain Active Problems:   HTN (hypertension)   Type 2 diabetes mellitus without complication ([place])   Time coordinating discharge: 25 minutes  Signed:  Murray Hodgkins, MD [facility] 01/06/2018, 8:40 AM

## 2018-03-18 ENCOUNTER — Inpatient Hospital Stay (HOSPITAL_COMMUNITY)
Admission: EM | Admit: 2018-03-18 | Discharge: 2018-03-21 | DRG: 552 | Disposition: A | Discharge status: Home / Self Care | Payer: Medicare Other | Attending: Family Medicine | Admitting: Family Medicine

## 2018-03-18 ENCOUNTER — Encounter (HOSPITAL_COMMUNITY): Payer: Self-pay

## 2018-03-18 ENCOUNTER — Emergency Department (HOSPITAL_COMMUNITY): Payer: Medicare Other

## 2018-03-18 DIAGNOSIS — R296 Repeated falls: Secondary | ICD-10-CM | POA: Diagnosis present

## 2018-03-18 DIAGNOSIS — W06XXXA Fall from bed, initial encounter: Secondary | ICD-10-CM | POA: Diagnosis present

## 2018-03-18 DIAGNOSIS — M549 Dorsalgia, unspecified: Principal | ICD-10-CM | POA: Diagnosis present

## 2018-03-18 DIAGNOSIS — M4056 Lordosis, unspecified, lumbar region: Secondary | ICD-10-CM | POA: Diagnosis present

## 2018-03-18 DIAGNOSIS — I10 Essential (primary) hypertension: Secondary | ICD-10-CM | POA: Diagnosis not present

## 2018-03-18 DIAGNOSIS — Z9689 Presence of other specified functional implants: Secondary | ICD-10-CM | POA: Diagnosis not present

## 2018-03-18 DIAGNOSIS — Z91040 Latex allergy status: Secondary | ICD-10-CM

## 2018-03-18 DIAGNOSIS — Z91048 Other nonmedicinal substance allergy status: Secondary | ICD-10-CM

## 2018-03-18 DIAGNOSIS — F419 Anxiety disorder, unspecified: Secondary | ICD-10-CM | POA: Diagnosis present

## 2018-03-18 DIAGNOSIS — W228XXA Striking against or struck by other objects, initial encounter: Secondary | ICD-10-CM | POA: Diagnosis present

## 2018-03-18 DIAGNOSIS — E119 Type 2 diabetes mellitus without complications: Secondary | ICD-10-CM | POA: Diagnosis not present

## 2018-03-18 DIAGNOSIS — R402140 Coma scale, eyes open, spontaneous, unspecified time: Secondary | ICD-10-CM | POA: Diagnosis not present

## 2018-03-18 DIAGNOSIS — Z9119 Patient's noncompliance with other medical treatment and regimen: Secondary | ICD-10-CM

## 2018-03-18 DIAGNOSIS — R102 Pelvic and perineal pain: Secondary | ICD-10-CM | POA: Diagnosis present

## 2018-03-18 DIAGNOSIS — Z888 Allergy status to other drugs, medicaments and biological substances status: Secondary | ICD-10-CM

## 2018-03-18 DIAGNOSIS — R402250 Coma scale, best verbal response, oriented, unspecified time: Secondary | ICD-10-CM | POA: Diagnosis not present

## 2018-03-18 DIAGNOSIS — Y92003 Bedroom of unspecified non-institutional (private) residence as the place of occurrence of the external cause: Secondary | ICD-10-CM

## 2018-03-18 DIAGNOSIS — Z5329 Procedure and treatment not carried out because of patient's decision for other reasons: Secondary | ICD-10-CM | POA: Diagnosis not present

## 2018-03-18 DIAGNOSIS — E876 Hypokalemia: Secondary | ICD-10-CM | POA: Diagnosis present

## 2018-03-18 DIAGNOSIS — S0990XA Unspecified injury of head, initial encounter: Secondary | ICD-10-CM | POA: Diagnosis not present

## 2018-03-18 DIAGNOSIS — M542 Cervicalgia: Secondary | ICD-10-CM | POA: Diagnosis present

## 2018-03-18 DIAGNOSIS — G894 Chronic pain syndrome: Secondary | ICD-10-CM | POA: Diagnosis not present

## 2018-03-18 DIAGNOSIS — Z79891 Long term (current) use of opiate analgesic: Secondary | ICD-10-CM

## 2018-03-18 DIAGNOSIS — Z7984 Long term (current) use of oral hypoglycemic drugs: Secondary | ICD-10-CM

## 2018-03-18 DIAGNOSIS — Z79899 Other long term (current) drug therapy: Secondary | ICD-10-CM

## 2018-03-18 DIAGNOSIS — E785 Hyperlipidemia, unspecified: Secondary | ICD-10-CM | POA: Diagnosis present

## 2018-03-18 DIAGNOSIS — R310 Gross hematuria: Secondary | ICD-10-CM | POA: Diagnosis present

## 2018-03-18 DIAGNOSIS — Z882 Allergy status to sulfonamides status: Secondary | ICD-10-CM

## 2018-03-18 DIAGNOSIS — R52 Pain, unspecified: Secondary | ICD-10-CM | POA: Diagnosis present

## 2018-03-18 DIAGNOSIS — Z7951 Long term (current) use of inhaled steroids: Secondary | ICD-10-CM

## 2018-03-18 DIAGNOSIS — F319 Bipolar disorder, unspecified: Secondary | ICD-10-CM | POA: Diagnosis present

## 2018-03-18 DIAGNOSIS — R402360 Coma scale, best motor response, obeys commands, unspecified time: Secondary | ICD-10-CM | POA: Diagnosis not present

## 2018-03-18 DIAGNOSIS — R51 Headache: Secondary | ICD-10-CM | POA: Diagnosis present

## 2018-03-18 DIAGNOSIS — R001 Bradycardia, unspecified: Secondary | ICD-10-CM | POA: Diagnosis present

## 2018-03-18 DIAGNOSIS — Z978 Presence of other specified devices: Secondary | ICD-10-CM

## 2018-03-18 HISTORY — DX: Bradycardia, unspecified: R00.1

## 2018-03-18 HISTORY — DX: Hypokalemia: E87.6

## 2018-03-18 LAB — PHOSPHORUS: Phosphorus: 3.6 mg/dL (ref 2.5–4.6)

## 2018-03-18 LAB — BASIC METABOLIC PANEL
Anion gap: 8 (ref 5–15)
BUN: 10 mg/dL (ref 6–20)
CO2: 25 mmol/L (ref 22–32)
Calcium: 8.5 mg/dL — ABNORMAL LOW (ref 8.9–10.3)
Chloride: 108 mmol/L (ref 98–111)
Creatinine, Ser: 0.76 mg/dL (ref 0.44–1.00)
GFR calc Af Amer: >60 mL/min (ref >60)
GFR calc non Af Amer: >60 mL/min (ref >60)
Glucose, Bld: 80 mg/dL (ref 70–99)
Potassium: 3.4 mmol/L — ABNORMAL LOW (ref 3.5–5.1)
Sodium: 141 mmol/L (ref 135–145)

## 2018-03-18 LAB — CBC
HCT: 40.4 % (ref 36.0–46.0)
Hemoglobin: 12.7 g/dL (ref 12.0–15.0)
MCH: 28 pg (ref 26.0–34.0)
MCHC: 31.4 g/dL (ref 30.0–36.0)
MCV: 89 fL (ref 78.0–100.0)
Platelets: 260 10*3/uL (ref 150–400)
RBC: 4.54 MIL/uL (ref 3.87–5.11)
RDW: 14.6 % (ref 11.5–15.5)
WBC: 6.7 10*3/uL (ref 4.0–10.5)

## 2018-03-18 LAB — MAGNESIUM: Magnesium: 1.8 mg/dL (ref 1.7–2.4)

## 2018-03-18 MED ORDER — METHOCARBAMOL 1000 MG/10ML IJ SOLN: 1000.0000 mg | Freq: Once | INTRAMUSCULAR | Status: DC

## 2018-03-18 MED ORDER — POTASSIUM CHLORIDE CRYS ER 20 MEQ PO TBCR
40.0000 meq | EXTENDED_RELEASE_TABLET | Freq: Once | ORAL | Status: AC
  Administered 2018-03-19: 40 meq via ORAL
  Filled 2018-03-18: qty 2

## 2018-03-18 MED ORDER — METHOCARBAMOL 500 MG PO TABS
1000.0000 mg | ORAL_TABLET | Freq: Once | ORAL | Status: AC
  Administered 2018-03-18: 1000 mg via ORAL
  Filled 2018-03-18 (×2): qty 2

## 2018-03-18 MED ORDER — HYDROMORPHONE HCL 1 MG/ML IJ SOLN
2.0000 mg | Freq: Once | INTRAMUSCULAR | Status: DC
  Filled 2018-03-18: qty 2

## 2018-03-18 MED ORDER — ACETAMINOPHEN 325 MG PO TABS
650.0000 mg | ORAL_TABLET | Freq: Once | ORAL | Status: AC
  Administered 2018-03-18: 650 mg via ORAL
  Filled 2018-03-18 (×2): qty 2

## 2018-03-18 MED ORDER — KETOROLAC TROMETHAMINE 30 MG/ML IJ SOLN
30.0000 mg | Freq: Once | INTRAMUSCULAR | Status: AC
  Administered 2018-03-18: 15 mg via INTRAVENOUS
  Filled 2018-03-18: qty 1

## 2018-03-18 MED ORDER — OXYCODONE-ACETAMINOPHEN 5-325 MG PO TABS
1.0000 | ORAL_TABLET | Freq: Once | ORAL | Status: AC
  Administered 2018-03-18: 1 via ORAL
  Filled 2018-03-18 (×2): qty 1

## 2018-03-18 MED ORDER — SODIUM CHLORIDE 0.9 % IV BOLUS
1000.0000 mL | Freq: Once | INTRAVENOUS | Status: AC
  Administered 2018-03-18: 1000 mL via INTRAVENOUS

## 2018-03-18 MED ORDER — HYDROMORPHONE HCL 1 MG/ML IJ SOLN
1.0000 mg | Freq: Once | INTRAMUSCULAR | Status: DC
  Administered 2018-03-18: 1 mg via INTRAVENOUS

## 2018-03-18 MED ORDER — HYDROMORPHONE HCL 1 MG/ML IJ SOLN
1.0000 mg | INTRAMUSCULAR | Status: DC | PRN
  Administered 2018-03-18 – 2018-03-19 (×3): 1 mg via INTRAVENOUS
  Filled 2018-03-18 (×3): qty 1

## 2018-03-18 NOTE — ED Notes (Signed)
Pts IV pump would not program. RN manually programed because of the prescribed rate. When charting intake pump charted 500cc. Pt received 1000cc of NS.

## 2018-03-18 NOTE — ED Triage Notes (Addendum)
Pt presents with report of falling out of bed.  Pt reports she was sitting on side of bed to go to the bathroom, stood up and fell due to her "legs giving out".  Pt has narcotic pump in place, pt requesting that we speak to her daughter for history.  Pt reports hitting her head on nightstand, unsure of LOC.

## 2018-03-18 NOTE — ED Notes (Signed)
Pt's family requested for IV and blood to be drawn from the IV, this NT acknowledged. Notified Julie(RN)

## 2018-03-18 NOTE — ED Notes (Signed)
Pt placed on O2 at 2 lpm via [place] for desaturation during sleep.  Pt reports that she has been having these "episodes" for nearly 3 weeks where she awakens during the night to use the restroom feeling lightheaded and gasping for air.  Primary RN and provider made aware.

## 2018-03-18 NOTE — ED Notes (Signed)
Patient transported to X-ray 

## 2018-03-18 NOTE — ED Notes (Signed)
Daughter reports pt has infusions of clonidine and fentanyl; last updated 3 weeks ago, pt able to give herself 120mcg boluses of fentanyl.

## 2018-03-18 NOTE — ED Notes (Signed)
PO meds offered, pt declined and pt's daughter reports "the last 2 times we've brought her here, we started out with the pills, they don't work and we have to have IV medicine, so that's what we want this time too."  PA-C notified.

## 2018-03-18 NOTE — ED Provider Notes (Signed)
[facility] EMERGENCY DEPARTMENT Provider Note   CSN: [id number] Arrival date & time: 03/18/18  1409     History   Chief Complaint Fall Back pain  HPI Linda Mercado is a 52 y.o. female with a hx of T2DM, HTN, and chronic back pain with trans-thecal pain pump in place who arrives to the ED via EMS s/p fall which occurred shortly prior to arrival with complaints of head, neck, and back pain.  Patient has chronic back pain managed by pain management with fentanyl & clonidine scheduled infusions, she additionally takes PO PRN oxycodone.  She states that she has some problems with her legs giving out, this happened 2 weeks ago resulting in a fall and again today resulting in another fall.  Today she was transitioning from sitting to standing and began to walk when the legs gave out, states that she fell to the side and hit her head, she is unsure if she had loss of consciousness.  She states she is having pain to her head, neck, and entire back.  Pain is a 10 out of 10 in severity.  She has not had her scheduled oxycodone this afternoon, did have her morning dose.  Reports baseline paresthesias and weakness to bilateral lower extremities, unchanged at present. Additionally pain radiates to bilatera lower extremities. She has had some mild nausea without vomiting.  Denies change in vision, vomiting, abdominal pain, shortness of breath, dizziness, or lightheadedness.  Denies incontinence, fever, or hx of cancer or IVDU. Ambulates with a cane at baseline, has not been able to ambulate last few days secondary to pain.  Her daughter is at bedside assisted in providing history.  They believe her narcotic pain pump is functioning appropriately given they are able to bolus.    HPI  Past Medical History:  Diagnosis Date  . Chronic back pain   . Diabetes mellitus without complication ([place])   . Hypertension     Patient Active Problem List   Diagnosis Date Noted  . Intractable back  pain 01/03/2018  . HTN (hypertension) 01/03/2018  . Type 2 diabetes mellitus without complication ([place]) 31/51/7616    History reviewed. No pertinent surgical history.   OB History   None      Home Medications    Prior to Admission medications   Medication Sig Start Date End Date Taking? Authorizing Provider  aspirin EC 81 MG tablet Take 81 mg by mouth daily.    [provider]  atenolol (TENORMIN) 100 MG tablet Take 100 mg by mouth daily.    [provider]  clonazePAM (KLONOPIN) 1 MG tablet Take 1 mg by mouth 3 (three) times daily.    [provider]  etodolac (LODINE) 400 MG tablet Take 400 mg by mouth 2 (two) times daily.    [provider]  hydrochlorothiazide (MICROZIDE) 12.5 MG capsule Take 12.5 mg by mouth daily.    [provider]  hydrOXYzine (ATARAX/VISTARIL) 25 MG tablet Take 25 mg by mouth every 6 (six) hours as needed for itching.    [provider]  losartan (COZAAR) 25 MG tablet Take 25 mg by mouth daily.    [provider]  metFORMIN (GLUCOPHAGE) 500 MG tablet Take 500 mg by mouth 2 (two) times daily with a meal.    [provider]  Norgestimate-Ethinyl Estradiol Triphasic (TRI-ESTARYLLA) 0.18/0.215/0.25 MG-35 MCG tablet Take 1 tablet by mouth daily.    [provider]  ondansetron (ZOFRAN) 4 MG tablet Take 4  mg by mouth every 6 (six) hours as needed for nausea or vomiting.    [provider]  oxyCODONE (OXY IR/ROXICODONE) 5 MG immediate release tablet Take 5 mg by mouth 4 (four) times daily. 12/30/17   [provider]  PRESCRIPTION MEDICATION Fentanyl pump- cont    [provider]  promethazine (PHENERGAN) 25 MG tablet Take 25 mg by mouth every 6 (six) hours as needed for nausea or vomiting.    [provider]  QUEtiapine (SEROQUEL XR) 400 MG 24 hr tablet Take 400 mg by mouth 2 (two) times daily.    [provider]  tiZANidine (ZANAFLEX) 4 MG  tablet Take 4 mg by mouth every 8 (eight) hours as needed for muscle spasms.    [provider]  venlafaxine XR (EFFEXOR-XR) 150 MG 24 hr capsule Take 150 mg by mouth daily with breakfast.    [provider]    Family History History reviewed. No pertinent family history.  Social History Social History   Tobacco Use  . Smoking status: Never Smoker  . Smokeless tobacco: Never Used  Substance Use Topics  . Alcohol use: Not Currently    Frequency: Never  . Drug use: Never     Allergies   Patient has no known allergies.   Review of Systems Review of Systems  All other systems reviewed and are negative.    Physical Exam Updated Vital Signs BP (!) 158/96 (BP Location: Right Arm)   Pulse (!) 58   Temp 98.2 F (36.8 C) (Oral)   Resp (!) 22   LMP 03/11/2018 (Exact Date)   SpO2 100%   Physical Exam  Constitutional: She appears well-developed and well-nourished.  Non-toxic appearance. She appears distressed (mild secondary to discomfort. ).  HENT:  Head: Normocephalic and atraumatic. Head is without raccoon's eyes and without Battle's sign.  Right Ear: No hemotympanum.  Left Ear: No hemotympanum.  Nose: No rhinorrhea.  Mouth/Throat: Uvula is midline and oropharynx is clear and moist.  Eyes: Pupils are equal, round, and reactive to light. Conjunctivae and EOM are normal. Right eye exhibits no discharge. Left eye exhibits no discharge.  Neck: Spinous process tenderness (diffuse, non focal) and muscular tenderness present.  NO palpable step off/crepitus  Cardiovascular: Normal rate and regular rhythm.  No murmur heard. Pulses:      Radial pulses are 2+ on the right side, and 2+ on the left side.       Dorsalis pedis pulses are 2+ on the right side, and 2+ on the left side.  Pulmonary/Chest: Effort normal and breath sounds normal. No respiratory distress. She has no wheezes. She has no rales.  Abdominal: Soft. She exhibits no distension. There is no  tenderness.  Musculoskeletal:  No obvious deformity, appreciable swelling, erythema, ecchymosis, or open wounds. Upper extremities patient has full range of motion at all joints.  She is diffusely tender over the posterior right shoulder without point/focal bony tenderness.  Upper extremities are otherwise nontender Back: Patient is diffusely tender to the lower thoracic and entire lumbar region including midline and bilateral paraspinal muscles.  No point/focal vertebral tenderness.  No palpable bony step-off. Lower extremities: Limited range of motion secondary to pain at the hips, she is able to move her knees and ankles somewhat.  No point/focal bony tenderness to the lower extremities.  Neurological:  Alert.  Clear speech.  CN III through XII grossly intact.  Symmetric intact grip strength.  Symmetric intact strength with ankle plantar/dorsiflexion and knee extension  bilaterally.  Patient sensation is intact upper and lower extremities, she does report her baseline paresthesias of the lower extremities.  Skin: Skin is warm and dry. No rash noted.  Psychiatric: She has a normal mood and affect. Her behavior is normal.  Nursing note and vitals reviewed.   ED Treatments / Results  Labs (all labs ordered are listed, but only abnormal results are displayed) Labs Reviewed  BASIC METABOLIC PANEL - Abnormal; Notable for the following components:      Result Value   Potassium 3.4 (*)    Calcium 8.5 (*)    All other components within normal limits  CBC  MAGNESIUM  PHOSPHORUS    EKG None  Radiology Dg Thoracic Spine 2 View  Result Date: 03/18/2018 CLINICAL DATA:  Multiple recent falls. Pain. For prior spine surgeries. EXAM: THORACIC SPINE 2 VIEWS; LUMBAR SPINE - COMPLETE 4+ VIEW COMPARISON:  Thoracic and lumbar spine radiograph December 24, 2017 FINDINGS: Thoracic spine: There is no evidence of thoracic spine fracture. Alignment is normal. No other significant bone abnormalities are identified.  Punctate metallic foreign body projecting at T8 canal, unchanged. Lumbar spine: Five non rib-bearing lumbar-type vertebral bodies are intact. No malalignment. Maintained lumbar lordosis. Status post L5-S1 PLIF with arthrodesis. Intact hardware without periprosthetic lucency. Moderate L4-5 facet arthropathy. No destructive bony lesions. Sacroiliac joints are symmetric. Included prevertebral and paraspinal soft tissue planes are non-suspicious. Battery stimulator plaque projects over RIGHT hip. Coil material projecting in lower abdomen most compatible with herniorrhaphy. IMPRESSION: Thoracic spine: No fracture deformity, malalignment or dance degenerative change for age. Lumbar spine: No fracture deformity or malalignment. L5-S1 PLIF with arthrodesis. Electronically Signed   By: Elon Alas M.D.   On: 03/18/2018 17:50   Dg Lumbar Spine Complete  Result Date: 03/18/2018 CLINICAL DATA:  Multiple recent falls. Pain. For prior spine surgeries. EXAM: THORACIC SPINE 2 VIEWS; LUMBAR SPINE - COMPLETE 4+ VIEW COMPARISON:  Thoracic and lumbar spine radiograph December 24, 2017 FINDINGS: Thoracic spine: There is no evidence of thoracic spine fracture. Alignment is normal. No other significant bone abnormalities are identified. Punctate metallic foreign body projecting at T8 canal, unchanged. Lumbar spine: Five non rib-bearing lumbar-type vertebral bodies are intact. No malalignment. Maintained lumbar lordosis. Status post L5-S1 PLIF with arthrodesis. Intact hardware without periprosthetic lucency. Moderate L4-5 facet arthropathy. No destructive bony lesions. Sacroiliac joints are symmetric. Included prevertebral and paraspinal soft tissue planes are non-suspicious. Battery stimulator plaque projects over RIGHT hip. Coil material projecting in lower abdomen most compatible with herniorrhaphy. IMPRESSION: Thoracic spine: No fracture deformity, malalignment or dance degenerative change for age. Lumbar spine: No fracture  deformity or malalignment. L5-S1 PLIF with arthrodesis. Electronically Signed   By: Elon Alas M.D.   On: 03/18/2018 17:50   Dg Shoulder Right  Result Date: 03/18/2018 CLINICAL DATA:  Multiple recent falls, fell today, fell onto LEFT side, superior RIGHT shoulder pain EXAM: RIGHT SHOULDER - 2+ VIEW COMPARISON:  None FINDINGS: Osseous mineralization normal. AC joint alignment normal. No acute fracture, dislocation or bone destruction. Visualized ribs unremarkable. IMPRESSION: Normal exam. Electronically Signed   By: Lavonia Dana M.D.   On: 03/18/2018 17:53   Ct Head Wo Contrast  Result Date: 03/18/2018 CLINICAL DATA:  52 year old female with a history of fall EXAM: CT HEAD WITHOUT CONTRAST CT CERVICAL SPINE WITHOUT CONTRAST TECHNIQUE: Multidetector CT imaging of the head and cervical spine was performed following the standard protocol without intravenous contrast. Multiplanar CT image reconstructions of the cervical spine were also generated. COMPARISON:  None. FINDINGS: CT HEAD FINDINGS Brain: No acute intracranial hemorrhage. No midline shift or mass effect. Gray-white differentiation maintained. Unremarkable appearance of the ventricular system. Focal hypodensity in the right basal ganglia, either a perivascular space or region of focal demyelination. Vascular: Unremarkable. Skull: No acute fracture.  No aggressive bone lesion identified. Sinuses/Orbits: Unremarkable appearance of the orbits. Mastoid air cells clear. No middle ear effusion. No significant sinus disease. Other: None CT CERVICAL SPINE FINDINGS Alignment: Apex right curvature, potentially positional. No subluxation. No anterolisthesis or retrolisthesis. Reversal the normal cervical lordosis. Skull base and vertebrae: No skull base fracture. Vertebral bodies demonstrate no acute fracture. No facet fracture. Facets maintain alignment. Soft tissues and spinal canal: No canal hematoma. Unremarkable appearance of the soft tissues of the  cervical region. Disc levels:  Disc space narrowing throughout the cervical spine. Posterior disc osteophyte complex at C3-C4 without significant canal narrowing. Posterior disc osteophyte complex at C5-C6 without significant canal narrowing. Posterior disc osteophyte complex at C6-C7, eccentric to the right with right-sided uncovertebral joint disease contributing to right foraminal narrowing. Upper chest: Unremarkable Other: None IMPRESSION: Head CT: No acute intracranial abnormality. Cervical CT: No acute fracture or malalignment of the cervical spine. Mild degenerative disc disease, with mild right foraminal narrowing at C6-C7 secondary to uncovertebral joint disease. Electronically Signed   By: Corrie Mckusick D.O.   On: 03/18/2018 16:28   Ct Cervical Spine Wo Contrast  Result Date: 03/18/2018 CLINICAL DATA:  52 year old female with a history of fall EXAM: CT HEAD WITHOUT CONTRAST CT CERVICAL SPINE WITHOUT CONTRAST TECHNIQUE: Multidetector CT imaging of the head and cervical spine was performed following the standard protocol without intravenous contrast. Multiplanar CT image reconstructions of the cervical spine were also generated. COMPARISON:  None. FINDINGS: CT HEAD FINDINGS Brain: No acute intracranial hemorrhage. No midline shift or mass effect. Gray-white differentiation maintained. Unremarkable appearance of the ventricular system. Focal hypodensity in the right basal ganglia, either a perivascular space or region of focal demyelination. Vascular: Unremarkable. Skull: No acute fracture.  No aggressive bone lesion identified. Sinuses/Orbits: Unremarkable appearance of the orbits. Mastoid air cells clear. No middle ear effusion. No significant sinus disease. Other: None CT CERVICAL SPINE FINDINGS Alignment: Apex right curvature, potentially positional. No subluxation. No anterolisthesis or retrolisthesis. Reversal the normal cervical lordosis. Skull base and vertebrae: No skull base fracture. Vertebral  bodies demonstrate no acute fracture. No facet fracture. Facets maintain alignment. Soft tissues and spinal canal: No canal hematoma. Unremarkable appearance of the soft tissues of the cervical region. Disc levels:  Disc space narrowing throughout the cervical spine. Posterior disc osteophyte complex at C3-C4 without significant canal narrowing. Posterior disc osteophyte complex at C5-C6 without significant canal narrowing. Posterior disc osteophyte complex at C6-C7, eccentric to the right with right-sided uncovertebral joint disease contributing to right foraminal narrowing. Upper chest: Unremarkable Other: None IMPRESSION: Head CT: No acute intracranial abnormality. Cervical CT: No acute fracture or malalignment of the cervical spine. Mild degenerative disc disease, with mild right foraminal narrowing at C6-C7 secondary to uncovertebral joint disease. Electronically Signed   By: Corrie Mckusick D.O.   On: 03/18/2018 16:28   Ct Thoracic Spine Wo Contrast  Result Date: 03/18/2018 CLINICAL DATA:  [place] from bed.  History of narcotic pump. EXAM: CT THORACIC AND LUMBAR SPINE WITHOUT CONTRAST TECHNIQUE: Multidetector CT imaging of the thoracic and lumbar spine was performed without contrast. Multiplanar CT image reconstructions were also generated. COMPARISON:  Thoracal lumbar spine radiographs March 18, 2018 FINDINGS: CT THORACIC SPINE FINDINGS  ALIGNMENT: Maintained thoracic kyphosis. No malalignment. VERTEBRAE: Vertebral bodies and posterior elements are intact. Intervertebral disc heights preserved, mild ventral endplate spurring. No destructive bony lesions. PARASPINAL AND OTHER SOFT TISSUES: Nonacute. Heterogeneous thyroid without dominant nodule. DISC LEVELS: Contiguous epidural catheter with tip at posterior T9 ventral canal. No disc bulge, canal stenosis nor neural foraminal narrowing. CT LUMBAR SPINE FINDINGS SEGMENTATION: For the purposes of this report the last well-formed intervertebral disc space is  reported as L5-S1. ALIGNMENT: Maintained lumbar lordosis. No malalignment. Mild broad dextroscoliosis on this nonweightbearing examination. VERTEBRAE: Vertebral bodies and posterior elements are intact. Status post L5-S1 PLIF with intact well-seated hardware. Incorporated posterior bone graft material. L5-S1 arthrosis. Nonsurgically altered disc heights preserved. No destructive bony lesions. PARASPINAL AND OTHER SOFT TISSUES: Contiguous epidural catheter via LEFT L2-3 interlaminar approach. Punctate RIGHT lower pole nephrolithiasis. At least small volume retained large bowel stool. DISC LEVELS: L1-2: No disc bulge, canal stenosis nor neural foraminal narrowing. L2-3: No disc bulge. Mild LEFT facet arthropathy without canal stenosis or neural foraminal narrowing. L3-4: Small broad-based disc bulge. Mild RIGHT and moderate LEFT facet arthropathy and ligamentum flavum redundancy without canal stenosis or neural foraminal narrowing. L4-5: Small broad-based disc bulge, small LEFT subarticular disc protrusion. Mild facet arthropathy without canal stenosis. Mild LEFT neural foraminal narrowing. L5-S1: PLIF. No canal stenosis. Severe osseous LEFT neural foraminal narrowing. IMPRESSION: CT THORACIC SPINE IMPRESSION 1. No fracture, malalignment or advanced degenerative change. 2. Epidural catheter tip at T9. CT LUMBAR SPINE IMPRESSION 1. No fracture or malalignment.  L5-S1 PLIF with arthrodesis. 2. Epidural catheter via LEFT L2-3 interlaminar approach. 3. No canal stenosis.  Severe LEFT L5-S1 neural foraminal narrowing. Electronically Signed   By: Elon Alas M.D.   On: 03/18/2018 20:10   Ct Lumbar Spine Wo Contrast  Result Date: 03/18/2018 CLINICAL DATA:  [place] from bed.  History of narcotic pump. EXAM: CT THORACIC AND LUMBAR SPINE WITHOUT CONTRAST TECHNIQUE: Multidetector CT imaging of the thoracic and lumbar spine was performed without contrast. Multiplanar CT image reconstructions were also generated.  COMPARISON:  Thoracal lumbar spine radiographs March 18, 2018 FINDINGS: CT THORACIC SPINE FINDINGS ALIGNMENT: Maintained thoracic kyphosis. No malalignment. VERTEBRAE: Vertebral bodies and posterior elements are intact. Intervertebral disc heights preserved, mild ventral endplate spurring. No destructive bony lesions. PARASPINAL AND OTHER SOFT TISSUES: Nonacute. Heterogeneous thyroid without dominant nodule. DISC LEVELS: Contiguous epidural catheter with tip at posterior T9 ventral canal. No disc bulge, canal stenosis nor neural foraminal narrowing. CT LUMBAR SPINE FINDINGS SEGMENTATION: For the purposes of this report the last well-formed intervertebral disc space is reported as L5-S1. ALIGNMENT: Maintained lumbar lordosis. No malalignment. Mild broad dextroscoliosis on this nonweightbearing examination. VERTEBRAE: Vertebral bodies and posterior elements are intact. Status post L5-S1 PLIF with intact well-seated hardware. Incorporated posterior bone graft material. L5-S1 arthrosis. Nonsurgically altered disc heights preserved. No destructive bony lesions. PARASPINAL AND OTHER SOFT TISSUES: Contiguous epidural catheter via LEFT L2-3 interlaminar approach. Punctate RIGHT lower pole nephrolithiasis. At least small volume retained large bowel stool. DISC LEVELS: L1-2: No disc bulge, canal stenosis nor neural foraminal narrowing. L2-3: No disc bulge. Mild LEFT facet arthropathy without canal stenosis or neural foraminal narrowing. L3-4: Small broad-based disc bulge. Mild RIGHT and moderate LEFT facet arthropathy and ligamentum flavum redundancy without canal stenosis or neural foraminal narrowing. L4-5: Small broad-based disc bulge, small LEFT subarticular disc protrusion. Mild facet arthropathy without canal stenosis. Mild LEFT neural foraminal narrowing. L5-S1: PLIF. No canal stenosis. Severe osseous LEFT neural foraminal narrowing. IMPRESSION: CT THORACIC  SPINE IMPRESSION 1. No fracture, malalignment or advanced  degenerative change. 2. Epidural catheter tip at T9. CT LUMBAR SPINE IMPRESSION 1. No fracture or malalignment.  L5-S1 PLIF with arthrodesis. 2. Epidural catheter via LEFT L2-3 interlaminar approach. 3. No canal stenosis.  Severe LEFT L5-S1 neural foraminal narrowing. Electronically Signed   By: Elon Alas M.D.   On: 03/18/2018 20:10    Procedures Procedures (including critical care time)  Medications Ordered in ED Medications  HYDROmorphone (DILAUDID) injection 1 mg (1 mg Intravenous Given 03/18/18 2125)  oxyCODONE-acetaminophen (PERCOCET/ROXICET) 5-325 MG per tablet 1 tablet (1 tablet Oral Given 03/18/18 2107)  acetaminophen (TYLENOL) tablet 650 mg (650 mg Oral Given 03/18/18 2106)  methocarbamol (ROBAXIN) tablet 1,000 mg (1,000 mg Oral Given 03/18/18 2106)  ketorolac (TORADOL) 30 MG/ML injection 30 mg (15 mg Intravenous Given 03/18/18 1912)  sodium chloride 0.9 % bolus 1,000 mL (1,000 mLs Intravenous New Bag/Given 03/18/18 2028)     Initial Impression / Assessment and Plan / ED Course  I have reviewed the triage vital signs and the nursing notes.  Pertinent labs & imaging results that were available during my care of the patient were reviewed by me and considered in my medical decision making (see chart for details).   Patient presents to the emergency department status post fall with acute on chronic back pain. Patient appears somewhat uncomfortable, nontoxic appearing.  She is diffusely tender in the cervical, thoracic, and lumbar regions without point/focal vertebral tenderness or palpable step-off.  She does have symmetric strength and sensation to the upper and lower extremities, reports baseline decreased sensation to the lower extremity due to paresthesias, able to give good strength at the ankle and the knee bilaterally. Given strength/sensation intact, no urinary incontinence/retention, doubt cauda equina syndrome. Patient afebrile, acute on chronic pain, doubt epidural abscess.  Will trial patient's at home dose of oxycodone with additional Robaxin, and Tylenol, hold off on NSAIDs until CT head resulted, re-eval to determine if IV narcotics are necessary.  Initial imaging included CT head and CT cervical spine as well as plain films of the thoracic/lumbar regions and R shoulder. Discussed with supervising physician Dr. Venora Maples- in agreement.   CT head negative for acute abnormalities.  Cervical CT as well as thoracic and lumbar plain films negative for acute fracture or dislocation.  Patient refused PO medications, stating that only IV medications are effective for her. Further discussed with Dr. Venora Maples who personally evaluated/examined patient- will give IV Dilaudid and encouraged patient to trial PO meds in addition to this. Will also give Toradol given negative CT of the head. Further evaluation with basic labs and CT thoracic/lumbar region.   CT scans without fracture or canal stenosis. Epidural catheter tips in place. Labs fairly unremarkable- mild hypokalemia and hypocalcemia at 3.4 and 8.5 repsectively.   22:20: RE-EVAL: Patient remains uncomfortable, states she has not had significant change in her pain while in the ED. She has received Toradol, Robaxin, Tylenol, Percocet, and Dilaudid x 3. Given continued pain will plan for admission for pain control.   23:02: CONSULT: Discussed case with hospitalist Dr. Roel Cluck, accepts admission.    Final Clinical Impressions(s) / ED Diagnoses   Final diagnoses:  Intractable back pain    ED Discharge Orders    None       Leafy Kindle 03/18/18 2314    Jola Schmidt, MD 03/18/18 2316

## 2018-03-18 NOTE — H&P (Signed)
Linda Mercado HWT:[id number] DOB: [birth year] DOA: 03/18/2018     PCP: Alfredo Martinez, PA  [facility]: Sharee Holster, MD - 06/06/2016  [address], [place], [place], [place] Phone: [phone] Fax: [phone]    Patient arrived to ER on 03/18/18 at 1409  Patient coming from: home Lives   With family    Chief Complaint: fall HPI: Linda Mercado is a 52 y.o. female with medical history significant of DM 2, HTN, chronic back pain sp intrathecal pump  Chronic pelvic pain  Presented with fall that occurred today patient sit up on the side of her bed to go to bathroom try to stand up and fell down because her legs gave out hit her head on the nightstand not sure if she passed out or not.  Patient has chronic intrathecal pump with fentanyl and clonidine scheduled infusions as well as will PRN oxycodone 2 weeks ago she had similar fall also because of her legs giving down she presented to emergency department complaining of head neck and back pain 10 out of 10 severity she is skipped her oxycodone dose in afternoon She has been a bit sedated lately Reports she supposed to use a walker but forgot.   Regarding pertinent Chronic problems: history of hypertension on atenolol  and losartan Regarding chronic pain -  While in ER:  Patient at first to receive her home dose of oxycodone and Robaxin CT head/neck thoracic and lumbar plain films negative for fractures or acute abnormalities Continue to have pain at which point she was given IV Dilaudid and Toradol. She continued to be uncomfortable pain seems to be not controlled by   IV Dilaudid.  The following Work up has been ordered so far:  Orders Placed This Encounter  Procedures  . CT Cervical Spine Wo Contrast  . CT Head Wo Contrast  . DG Thoracic Spine 2 View  . DG Lumbar Spine Complete  . DG Shoulder Right  . CT Thoracic Spine Wo Contrast  . CT Lumbar Spine Wo Contrast  .  CBC  . Basic metabolic panel  . Apply heat to affected area  . Consult to hospitalist    Following Medications were ordered in ER: Medications  HYDROmorphone (DILAUDID) injection 1 mg (1 mg Intravenous Given 03/18/18 2125)  potassium chloride SA (K-DUR,KLOR-CON) CR tablet 40 mEq (has no administration in time range)  oxyCODONE-acetaminophen (PERCOCET/ROXICET) 5-325 MG per tablet 1 tablet (1 tablet Oral Given 03/18/18 2107)  acetaminophen (TYLENOL) tablet 650 mg (650 mg Oral Given 03/18/18 2106)  methocarbamol (ROBAXIN) tablet 1,000 mg (1,000 mg Oral Given 03/18/18 2106)  ketorolac (TORADOL) 30 MG/ML injection 30 mg (15 mg Intravenous Given 03/18/18 1912)  sodium chloride 0.9 % bolus 1,000 mL (0 mLs Intravenous Stopped 03/18/18 2059)    Significant initial  Findings: Abnormal Labs Reviewed  BASIC METABOLIC PANEL - Abnormal; Notable for the following components:      Result Value   Potassium 3.4 (*)    Calcium 8.5 (*)    All other components within normal limits     Na 141 K 3.4  Cr   stable,   Lab Results  Component Value Date   CREATININE 0.76 03/18/2018   CREATININE 0.75 01/04/2018   CREATININE 0.69 01/03/2018      WBC  6.7  HG/HCT  stable,      Component Value Date/Time   HGB 12.7 03/18/2018 2019   HCT 40.4 03/18/2018 2019  UA  not ordered   CT HEAD/neck -  NON acute Right shoulder - no fracture Plain imaging lumbar spine non-acute Imaging thoracic spine non-acute CT lumbar spine /Thoracic spine nonacute CXR -  NON acute      ECG:  Not obtained   ED Triage Vitals  Enc Vitals Group     BP 03/18/18 1418 (!) 158/96     Pulse Rate 03/18/18 1418 (!) 58     Resp 03/18/18 1418 (!) 22     Temp 03/18/18 1418 98.2 F (36.8 C)     Temp Source 03/18/18 1418 Oral     SpO2 03/18/18 1418 100 %     Weight --      Height --      Head Circumference --      Peak Flow --      Pain Score 03/18/18 1421 10     Pain Loc --      Pain Edu? --      Excl. in [place]?  --   TMAX(24)@       Latest  Blood pressure (!) 123/52, pulse (!) 52, temperature 98.2 F (36.8 C), temperature source Oral, resp. rate 18, last menstrual period 03/11/2018, SpO2 97 %.     Hospitalist was called for admission for intractable back pain   Review of Systems:    Pertinent positives include: falls, bilateral leg weakness  Constitutional:  No weight loss, night sweats, Fevers, chills, fatigue, weight loss  HEENT:  No headaches, Difficulty swallowing,Tooth/dental problems,Sore throat,  No sneezing, itching, ear ache, nasal congestion, post nasal drip,  Cardio-vascular:  No chest pain, Orthopnea, PND, anasarca, dizziness, palpitations.no Bilateral lower extremity swelling  GI:  No heartburn, indigestion, abdominal pain, nausea, vomiting, diarrhea, change in bowel habits, loss of appetite, melena, blood in stool, hematemesis Resp:  no shortness of breath at rest. No dyspnea on exertion, No excess mucus, no productive cough, No non-productive cough, No coughing up of blood.No change in color of mucus.No wheezing. Skin:  no rash or lesions. No jaundice GU:  no dysuria, change in color of urine, no urgency or frequency. No straining to urinate.  No flank pain.  Musculoskeletal:  No joint pain or no joint swelling. No decreased range of motion. No back pain.  Psych:  No change in mood or affect. No depression or anxiety. No memory loss.  Neuro: no localizing neurological complaints, no tingling, no weakness, no double vision, no gait abnormality, no slurred speech, no confusion  All systems reviewed and apart from [place] all are negative  Past Medical History:   Past Medical History:  Diagnosis Date  . Chronic back pain   . Diabetes mellitus without complication ([place])   . Hypertension       History reviewed. No pertinent surgical history.  Social History:  Ambulatory  walker       reports that she has never smoked. She has never used smokeless tobacco. She  reports that she drank alcohol. She reports that she does not use drugs.     Family History:   Family History  Problem Relation Age of Onset  . Diabetes Father   . Hypertension Father   . Diabetes Other   . Hypertension Other   . Stroke Neg Hx   . Cancer Neg Hx   . CAD Neg Hx     Allergies: Allergies  Allergen Reactions  . Lisinopril Hives    Other reaction(s): anaphylaxis/angioedema  . Latex Hives  . Adhesive [Tape] Rash  .  Sulfamethoxazole-Trimethoprim Diarrhea    Other reaction(s): gi distress     Prior to Admission medications   Medication Sig Start Date End Date Taking? Authorizing Provider  albuterol (PROVENTIL HFA;VENTOLIN HFA) 108 (90 Base) MCG/ACT inhaler Inhale 2 puffs into the lungs every 6 (six) hours as needed for wheezing or shortness of breath.    Yes [provider]  atenolol (TENORMIN) 100 MG tablet Take 100 mg by mouth daily.   Yes [provider]  atorvastatin (LIPITOR) 20 MG tablet Take 20 mg by mouth at bedtime. 11/20/17  Yes [provider]  clonazePAM (KLONOPIN) 1 MG tablet Take 1 mg by mouth 3 (three) times daily.   Yes [provider]  diclofenac sodium (VOLTAREN) 1 % GEL Apply 4 g topically 4 (four) times daily as needed for pain. 04/24/17  Yes [provider]  divalproex (DEPAKOTE ER) 500 MG 24 hr tablet Take 1,000 mg by mouth daily.  02/09/18  Yes [provider]  docusate sodium (COLACE) 50 MG capsule Take 50 mg by mouth at bedtime.   Yes [provider]  etodolac (LODINE) 400 MG tablet Take 400 mg by mouth 2 (two) times daily.   Yes [provider]  fluticasone (FLOVENT HFA) 110 MCG/ACT inhaler Inhale 2 puffs into the lungs every 12 (twelve) hours.   Yes [provider]  hydrochlorothiazide (MICROZIDE) 12.5 MG capsule Take 12.5 mg by mouth daily.   Yes [provider]  hydrOXYzine (ATARAX/VISTARIL) 25 MG tablet Take 25 mg by mouth 2 (two) times daily.    Yes  [provider]  lidocaine (LIDODERM) 5 % Apply 1 patch topically daily as needed (for back pain).  04/24/17 04/24/18 Yes [provider]  losartan (COZAAR) 25 MG tablet Take 25 mg by mouth daily.   Yes [provider]  metFORMIN (GLUCOPHAGE) 500 MG tablet Take 500 mg by mouth 2 (two) times daily with a meal.   Yes [provider]  NARCAN 4 MG/0.1ML LIQD nasal spray kit Place 1 spray into the nose as directed. 01/19/18  Yes [provider]  Norgestimate-Ethinyl Estradiol Triphasic (TRI-ESTARYLLA) 0.18/0.215/0.25 MG-35 MCG tablet Take 1 tablet by mouth daily.   Yes [provider]  ondansetron (ZOFRAN) 4 MG tablet Take 4 mg by mouth every 6 (six) hours as needed for nausea or vomiting.   Yes [provider]  oxyCODONE (OXY IR/ROXICODONE) 5 MG immediate release tablet Take 5 mg by mouth 4 (four) times daily. 12/30/17  Yes [provider]  PRESCRIPTION MEDICATION Pain pump (Fentanyl 100 mcg/ml) and Clonidine (150 mcg/ml): Continuous and patient may bolus and extra 2 mcg four times a day   Yes [provider]  promethazine (PHENERGAN) 25 MG tablet Take 25 mg by mouth every 6 (six) hours as needed for nausea or vomiting.   Yes [provider]  QUEtiapine (SEROQUEL XR) 400 MG 24 hr tablet Take 400 mg by mouth 2 (two) times daily.   Yes [provider]  tiZANidine (ZANAFLEX) 4 MG tablet Take 4 mg by mouth every 8 (eight) hours as needed (for nerve pain).    Yes [provider]  triamcinolone cream (KENALOG) 0.1 % Apply 1 application topically daily as needed (for itching).    Yes [provider]  vortioxetine HBr (TRINTELLIX) 10 MG TABS tablet Take 10 mg by mouth at bedtime.   Yes [provider]   Physical Exam: Blood pressure (!) 123/52, pulse (!) 52, temperature 98.2 F (36.8 C), temperature  source Oral, resp. rate 18, last menstrual period 03/11/2018, SpO2 97 %. 1. General:  in No  Acute distress  Chronically ill -appearing 2. Psychological: Alert and Oriented 3. Head/ENT:   Moist Mucous Membranes                          Head Non traumatic, neck supple                           Poor Dentition 4. SKIN: normal  Skin turgor,  Skin clean Dry and intact no rash 5. Heart: Regular rate and rhythm no Murmur, no Rub or gallop 6. Lungs:   no wheezes or crackles   7. Abdomen: Soft,  non-tender, Non distended bowel sounds present 8. Lower extremities: no clubbing, cyanosis, or  edema 9. Neurologically Grossly intact, moving all 4 extremities equally   10. MSK: Normal range of motion   LABS:     Recent Labs  Lab 03/18/18 2019  WBC 6.7  HGB 12.7  HCT 40.4  MCV 89.0  PLT 034   Basic Metabolic Panel: Recent Labs  Lab 03/18/18 2137  NA 141  K 3.4*  CL 108  CO2 25  GLUCOSE 80  BUN 10  CREATININE 0.76  CALCIUM 8.5*      No results for input(s): AST, ALT, ALKPHOS, BILITOT, PROT, ALBUMIN in the last 168 hours. No results for input(s): LIPASE, AMYLASE in the last 168 hours. No results for input(s): AMMONIA in the last 168 hours.    HbA1C: No results for input(s): HGBA1C in the last 72 hours. CBG: No results for input(s): GLUCAP in the last 168 hours.    Urine analysis:    Component Value Date/Time   COLORURINE YELLOW 01/03/2018 1550   APPEARANCEUR CLEAR 01/03/2018 1550   LABSPEC 1.006 01/03/2018 1550   PHURINE 7.0 01/03/2018 1550   GLUCOSEU NEGATIVE 01/03/2018 1550   HGBUR NEGATIVE 01/03/2018 1550   BILIRUBINUR NEGATIVE 01/03/2018 1550   KETONESUR NEGATIVE 01/03/2018 1550   PROTEINUR NEGATIVE 01/03/2018 1550   NITRITE NEGATIVE 01/03/2018 1550   LEUKOCYTESUR MODERATE (A) 01/03/2018 1550       Cultures: No results found for: SDES, SPECREQUEST, CULT, REPTSTATUS   Radiological Exams on Admission: Dg Thoracic Spine 2 View  Result Date: 03/18/2018 CLINICAL DATA:  Multiple recent falls. Pain. For prior spine surgeries. EXAM: THORACIC SPINE 2  VIEWS; LUMBAR SPINE - COMPLETE 4+ VIEW COMPARISON:  Thoracic and lumbar spine radiograph December 24, 2017 FINDINGS: Thoracic spine: There is no evidence of thoracic spine fracture. Alignment is normal. No other significant bone abnormalities are identified. Punctate metallic foreign body projecting at T8 canal, unchanged. Lumbar spine: Five non rib-bearing lumbar-type vertebral bodies are intact. No malalignment. Maintained lumbar lordosis. Status post L5-S1 PLIF with arthrodesis. Intact hardware without periprosthetic lucency. Moderate L4-5 facet arthropathy. No destructive bony lesions. Sacroiliac joints are symmetric. Included prevertebral and paraspinal soft tissue planes are non-suspicious. Battery stimulator plaque projects over RIGHT hip. Coil material projecting in lower abdomen most compatible with herniorrhaphy. IMPRESSION: Thoracic spine: No fracture deformity, malalignment or dance degenerative change for age. Lumbar spine: No fracture deformity or malalignment. L5-S1 PLIF with arthrodesis. Electronically Signed   By: Elon Alas M.D.   On: 03/18/2018 17:50   Dg Lumbar Spine Complete  Result Date: 03/18/2018 CLINICAL DATA:  Multiple recent falls. Pain. For prior spine surgeries. EXAM: THORACIC SPINE 2 VIEWS; LUMBAR SPINE - COMPLETE 4+ VIEW  COMPARISON:  Thoracic and lumbar spine radiograph December 24, 2017 FINDINGS: Thoracic spine: There is no evidence of thoracic spine fracture. Alignment is normal. No other significant bone abnormalities are identified. Punctate metallic foreign body projecting at T8 canal, unchanged. Lumbar spine: Five non rib-bearing lumbar-type vertebral bodies are intact. No malalignment. Maintained lumbar lordosis. Status post L5-S1 PLIF with arthrodesis. Intact hardware without periprosthetic lucency. Moderate L4-5 facet arthropathy. No destructive bony lesions. Sacroiliac joints are symmetric. Included prevertebral and paraspinal soft tissue planes are non-suspicious. Battery  stimulator plaque projects over RIGHT hip. Coil material projecting in lower abdomen most compatible with herniorrhaphy. IMPRESSION: Thoracic spine: No fracture deformity, malalignment or dance degenerative change for age. Lumbar spine: No fracture deformity or malalignment. L5-S1 PLIF with arthrodesis. Electronically Signed   By: Elon Alas M.D.   On: 03/18/2018 17:50   Dg Shoulder Right  Result Date: 03/18/2018 CLINICAL DATA:  Multiple recent falls, fell today, fell onto LEFT side, superior RIGHT shoulder pain EXAM: RIGHT SHOULDER - 2+ VIEW COMPARISON:  None FINDINGS: Osseous mineralization normal. AC joint alignment normal. No acute fracture, dislocation or bone destruction. Visualized ribs unremarkable. IMPRESSION: Normal exam. Electronically Signed   By: Lavonia Dana M.D.   On: 03/18/2018 17:53   Ct Head Wo Contrast  Result Date: 03/18/2018 CLINICAL DATA:  52 year old female with a history of fall EXAM: CT HEAD WITHOUT CONTRAST CT CERVICAL SPINE WITHOUT CONTRAST TECHNIQUE: Multidetector CT imaging of the head and cervical spine was performed following the standard protocol without intravenous contrast. Multiplanar CT image reconstructions of the cervical spine were also generated. COMPARISON:  None. FINDINGS: CT HEAD FINDINGS Brain: No acute intracranial hemorrhage. No midline shift or mass effect. Gray-white differentiation maintained. Unremarkable appearance of the ventricular system. Focal hypodensity in the right basal ganglia, either a perivascular space or region of focal demyelination. Vascular: Unremarkable. Skull: No acute fracture.  No aggressive bone lesion identified. Sinuses/Orbits: Unremarkable appearance of the orbits. Mastoid air cells clear. No middle ear effusion. No significant sinus disease. Other: None CT CERVICAL SPINE FINDINGS Alignment: Apex right curvature, potentially positional. No subluxation. No anterolisthesis or retrolisthesis. Reversal the normal cervical lordosis.  Skull base and vertebrae: No skull base fracture. Vertebral bodies demonstrate no acute fracture. No facet fracture. Facets maintain alignment. Soft tissues and spinal canal: No canal hematoma. Unremarkable appearance of the soft tissues of the cervical region. Disc levels:  Disc space narrowing throughout the cervical spine. Posterior disc osteophyte complex at C3-C4 without significant canal narrowing. Posterior disc osteophyte complex at C5-C6 without significant canal narrowing. Posterior disc osteophyte complex at C6-C7, eccentric to the right with right-sided uncovertebral joint disease contributing to right foraminal narrowing. Upper chest: Unremarkable Other: None IMPRESSION: Head CT: No acute intracranial abnormality. Cervical CT: No acute fracture or malalignment of the cervical spine. Mild degenerative disc disease, with mild right foraminal narrowing at C6-C7 secondary to uncovertebral joint disease. Electronically Signed   By: Corrie Mckusick D.O.   On: 03/18/2018 16:28   Ct Cervical Spine Wo Contrast  Result Date: 03/18/2018 CLINICAL DATA:  52 year old female with a history of fall EXAM: CT HEAD WITHOUT CONTRAST CT CERVICAL SPINE WITHOUT CONTRAST TECHNIQUE: Multidetector CT imaging of the head and cervical spine was performed following the standard protocol without intravenous contrast. Multiplanar CT image reconstructions of the cervical spine were also generated. COMPARISON:  None. FINDINGS: CT HEAD FINDINGS Brain: No acute intracranial hemorrhage. No midline shift or mass effect. Gray-white differentiation maintained. Unremarkable appearance of the ventricular system. Focal hypodensity  in the right basal ganglia, either a perivascular space or region of focal demyelination. Vascular: Unremarkable. Skull: No acute fracture.  No aggressive bone lesion identified. Sinuses/Orbits: Unremarkable appearance of the orbits. Mastoid air cells clear. No middle ear effusion. No significant sinus disease.  Other: None CT CERVICAL SPINE FINDINGS Alignment: Apex right curvature, potentially positional. No subluxation. No anterolisthesis or retrolisthesis. Reversal the normal cervical lordosis. Skull base and vertebrae: No skull base fracture. Vertebral bodies demonstrate no acute fracture. No facet fracture. Facets maintain alignment. Soft tissues and spinal canal: No canal hematoma. Unremarkable appearance of the soft tissues of the cervical region. Disc levels:  Disc space narrowing throughout the cervical spine. Posterior disc osteophyte complex at C3-C4 without significant canal narrowing. Posterior disc osteophyte complex at C5-C6 without significant canal narrowing. Posterior disc osteophyte complex at C6-C7, eccentric to the right with right-sided uncovertebral joint disease contributing to right foraminal narrowing. Upper chest: Unremarkable Other: None IMPRESSION: Head CT: No acute intracranial abnormality. Cervical CT: No acute fracture or malalignment of the cervical spine. Mild degenerative disc disease, with mild right foraminal narrowing at C6-C7 secondary to uncovertebral joint disease. Electronically Signed   By: Corrie Mckusick D.O.   On: 03/18/2018 16:28   Ct Thoracic Spine Wo Contrast  Result Date: 03/18/2018 CLINICAL DATA:  [place] from bed.  History of narcotic pump. EXAM: CT THORACIC AND LUMBAR SPINE WITHOUT CONTRAST TECHNIQUE: Multidetector CT imaging of the thoracic and lumbar spine was performed without contrast. Multiplanar CT image reconstructions were also generated. COMPARISON:  Thoracal lumbar spine radiographs March 18, 2018 FINDINGS: CT THORACIC SPINE FINDINGS ALIGNMENT: Maintained thoracic kyphosis. No malalignment. VERTEBRAE: Vertebral bodies and posterior elements are intact. Intervertebral disc heights preserved, mild ventral endplate spurring. No destructive bony lesions. PARASPINAL AND OTHER SOFT TISSUES: Nonacute. Heterogeneous thyroid without dominant nodule. DISC LEVELS:  Contiguous epidural catheter with tip at posterior T9 ventral canal. No disc bulge, canal stenosis nor neural foraminal narrowing. CT LUMBAR SPINE FINDINGS SEGMENTATION: For the purposes of this report the last well-formed intervertebral disc space is reported as L5-S1. ALIGNMENT: Maintained lumbar lordosis. No malalignment. Mild broad dextroscoliosis on this nonweightbearing examination. VERTEBRAE: Vertebral bodies and posterior elements are intact. Status post L5-S1 PLIF with intact well-seated hardware. Incorporated posterior bone graft material. L5-S1 arthrosis. Nonsurgically altered disc heights preserved. No destructive bony lesions. PARASPINAL AND OTHER SOFT TISSUES: Contiguous epidural catheter via LEFT L2-3 interlaminar approach. Punctate RIGHT lower pole nephrolithiasis. At least small volume retained large bowel stool. DISC LEVELS: L1-2: No disc bulge, canal stenosis nor neural foraminal narrowing. L2-3: No disc bulge. Mild LEFT facet arthropathy without canal stenosis or neural foraminal narrowing. L3-4: Small broad-based disc bulge. Mild RIGHT and moderate LEFT facet arthropathy and ligamentum flavum redundancy without canal stenosis or neural foraminal narrowing. L4-5: Small broad-based disc bulge, small LEFT subarticular disc protrusion. Mild facet arthropathy without canal stenosis. Mild LEFT neural foraminal narrowing. L5-S1: PLIF. No canal stenosis. Severe osseous LEFT neural foraminal narrowing. IMPRESSION: CT THORACIC SPINE IMPRESSION 1. No fracture, malalignment or advanced degenerative change. 2. Epidural catheter tip at T9. CT LUMBAR SPINE IMPRESSION 1. No fracture or malalignment.  L5-S1 PLIF with arthrodesis. 2. Epidural catheter via LEFT L2-3 interlaminar approach. 3. No canal stenosis.  Severe LEFT L5-S1 neural foraminal narrowing. Electronically Signed   By: Elon Alas M.D.   On: 03/18/2018 20:10   Ct Lumbar Spine Wo Contrast  Result Date: 03/18/2018 CLINICAL DATA:  [place] from  bed.  History of narcotic pump. EXAM: CT THORACIC AND  LUMBAR SPINE WITHOUT CONTRAST TECHNIQUE: Multidetector CT imaging of the thoracic and lumbar spine was performed without contrast. Multiplanar CT image reconstructions were also generated. COMPARISON:  Thoracal lumbar spine radiographs March 18, 2018 FINDINGS: CT THORACIC SPINE FINDINGS ALIGNMENT: Maintained thoracic kyphosis. No malalignment. VERTEBRAE: Vertebral bodies and posterior elements are intact. Intervertebral disc heights preserved, mild ventral endplate spurring. No destructive bony lesions. PARASPINAL AND OTHER SOFT TISSUES: Nonacute. Heterogeneous thyroid without dominant nodule. DISC LEVELS: Contiguous epidural catheter with tip at posterior T9 ventral canal. No disc bulge, canal stenosis nor neural foraminal narrowing. CT LUMBAR SPINE FINDINGS SEGMENTATION: For the purposes of this report the last well-formed intervertebral disc space is reported as L5-S1. ALIGNMENT: Maintained lumbar lordosis. No malalignment. Mild broad dextroscoliosis on this nonweightbearing examination. VERTEBRAE: Vertebral bodies and posterior elements are intact. Status post L5-S1 PLIF with intact well-seated hardware. Incorporated posterior bone graft material. L5-S1 arthrosis. Nonsurgically altered disc heights preserved. No destructive bony lesions. PARASPINAL AND OTHER SOFT TISSUES: Contiguous epidural catheter via LEFT L2-3 interlaminar approach. Punctate RIGHT lower pole nephrolithiasis. At least small volume retained large bowel stool. DISC LEVELS: L1-2: No disc bulge, canal stenosis nor neural foraminal narrowing. L2-3: No disc bulge. Mild LEFT facet arthropathy without canal stenosis or neural foraminal narrowing. L3-4: Small broad-based disc bulge. Mild RIGHT and moderate LEFT facet arthropathy and ligamentum flavum redundancy without canal stenosis or neural foraminal narrowing. L4-5: Small broad-based disc bulge, small LEFT subarticular disc protrusion. Mild  facet arthropathy without canal stenosis. Mild LEFT neural foraminal narrowing. L5-S1: PLIF. No canal stenosis. Severe osseous LEFT neural foraminal narrowing. IMPRESSION: CT THORACIC SPINE IMPRESSION 1. No fracture, malalignment or advanced degenerative change. 2. Epidural catheter tip at T9. CT LUMBAR SPINE IMPRESSION 1. No fracture or malalignment.  L5-S1 PLIF with arthrodesis. 2. Epidural catheter via LEFT L2-3 interlaminar approach. 3. No canal stenosis.  Severe LEFT L5-S1 neural foraminal narrowing. Electronically Signed   By: Elon Alas M.D.   On: 03/18/2018 20:10    Chart has been reviewed    Assessment/Plan   52 y.o. female with medical history significant of DM 2, HTN, chronic back pain sp intrathecal pump  Chronic pelvic pain  Admitted for intractable back pain and recent falls  Present on Admission: . HTN (hypertension) - stable continue home medications . Intractable back pain -extensive work-up in emergency department showed no evidence of acute changes.  May benefit from neurosurgical referral.  She currently appears to be sedated unable to keep her eyes open in order to carry conversation.  Will avoid IV narcotics patient denies any nausea vomiting.  Try to avoid over sedating medications.  Continue Robaxin and Toradol as needed hold off on IV Dilaudid instead will give oxycodone as needed.  Will need to follow-up with chronic pain medicine and PT OT assessment . Chronic pain syndrome difficult to manage will need follow-up with primary pain management clinic.  Avoid IV narcotics given sedation and no indication . Hypokalemia replace electrolytes Dm 2 -  - Order Sensitive   SSI     -  check TSH and HgA1C  - Hold by mouth medications     Other plan as per orders.  DVT prophylaxis:  SCD     Code Status:  FULL CODE   as per patient   I had personally discussed CODE STATUS with patient   oals of care and CODE STATUS  Family Communication:   Family not  at  Bedside       Disposition Plan:  To home once workup is complete and patient is stable                      Would benefit from PT/OT eval prior to [place] called: none   Admission status:   obs   Level of care       medical floor           Toy Baker 03/19/2018, 12:47 AM    [facility]  Pager [phone]   after 2 AM please page floor coverage PA If 7AM-7PM, please contact the day team taking care of the patient  [email or web address]  Password [email or web address]

## 2018-03-19 ENCOUNTER — Encounter (HOSPITAL_COMMUNITY): Payer: Self-pay | Admitting: Emergency Medicine

## 2018-03-19 ENCOUNTER — Other Ambulatory Visit: Payer: Self-pay

## 2018-03-19 DIAGNOSIS — E119 Type 2 diabetes mellitus without complications: Secondary | ICD-10-CM

## 2018-03-19 DIAGNOSIS — M549 Dorsalgia, unspecified: Principal | ICD-10-CM

## 2018-03-19 DIAGNOSIS — G894 Chronic pain syndrome: Secondary | ICD-10-CM | POA: Diagnosis not present

## 2018-03-19 DIAGNOSIS — Z9689 Presence of other specified functional implants: Secondary | ICD-10-CM

## 2018-03-19 LAB — COMPREHENSIVE METABOLIC PANEL
ALT: 16 U/L (ref 0–44)
AST: 19 U/L (ref 15–41)
Albumin: 3.9 g/dL (ref 3.5–5.0)
Alkaline Phosphatase: 67 U/L (ref 38–126)
Anion gap: 8 (ref 5–15)
BUN: 11 mg/dL (ref 6–20)
CO2: 26 mmol/L (ref 22–32)
Calcium: 9 mg/dL (ref 8.9–10.3)
Chloride: 107 mmol/L (ref 98–111)
Creatinine, Ser: 0.89 mg/dL (ref 0.44–1.00)
GFR calc Af Amer: >60 mL/min (ref >60)
GFR calc non Af Amer: >60 mL/min (ref >60)
Glucose, Bld: 112 mg/dL — ABNORMAL HIGH (ref 70–99)
Potassium: 4.2 mmol/L (ref 3.5–5.1)
Sodium: 141 mmol/L (ref 135–145)
Total Bilirubin: 0.7 mg/dL (ref 0.3–1.2)
Total Protein: 7.2 g/dL (ref 6.5–8.1)

## 2018-03-19 LAB — CBC
HCT: 40.4 % (ref 36.0–46.0)
Hemoglobin: 12.4 g/dL (ref 12.0–15.0)
MCH: 27.6 pg (ref 26.0–34.0)
MCHC: 30.7 g/dL (ref 30.0–36.0)
MCV: 90 fL (ref 78.0–100.0)
Platelets: 277 10*3/uL (ref 150–400)
RBC: 4.49 MIL/uL (ref 3.87–5.11)
RDW: 14.7 % (ref 11.5–15.5)
WBC: 7.8 10*3/uL (ref 4.0–10.5)

## 2018-03-19 LAB — URINALYSIS, ROUTINE W REFLEX MICROSCOPIC
Glucose, UA: NEGATIVE mg/dL
Hgb urine dipstick: NEGATIVE
Ketones, ur: 20 mg/dL — AB
Leukocytes, UA: NEGATIVE
Nitrite: NEGATIVE
Protein, ur: NEGATIVE mg/dL
Specific Gravity, Urine: 1.029 (ref 1.005–1.030)
pH: 5 (ref 5.0–8.0)

## 2018-03-19 LAB — PHOSPHORUS: Phosphorus: 5.2 mg/dL — ABNORMAL HIGH (ref 2.5–4.6)

## 2018-03-19 LAB — MAGNESIUM: Magnesium: 2 mg/dL (ref 1.7–2.4)

## 2018-03-19 LAB — TSH: TSH: 1.718 u[IU]/mL (ref 0.350–4.500)

## 2018-03-19 MED ORDER — ACETAMINOPHEN 325 MG PO TABS
650.0000 mg | ORAL_TABLET | Freq: Four times a day (QID) | ORAL | Status: DC | PRN
  Administered 2018-03-19: 650 mg via ORAL
  Filled 2018-03-19: qty 2

## 2018-03-19 MED ORDER — HYDROMORPHONE HCL 1 MG/ML IJ SOLN
0.5000 mg | INTRAMUSCULAR | Status: DC | PRN
  Administered 2018-03-19 – 2018-03-20 (×6): 0.5 mg via INTRAVENOUS
  Filled 2018-03-19: qty 1
  Filled 2018-03-19 (×4): qty 0.5
  Filled 2018-03-19: qty 1

## 2018-03-19 MED ORDER — POLYETHYLENE GLYCOL 3350 17 G PO PACK
17.0000 g | PACK | Freq: Every day | ORAL | Status: DC
  Administered 2018-03-19 – 2018-03-21 (×3): 17 g via ORAL
  Filled 2018-03-19 (×3): qty 1

## 2018-03-19 MED ORDER — ACETAMINOPHEN 650 MG RE SUPP
650.0000 mg | RECTAL | Status: DC
  Filled 2018-03-19: qty 1

## 2018-03-19 MED ORDER — OXYCODONE HCL 5 MG PO TABS
10.0000 mg | ORAL_TABLET | ORAL | Status: DC | PRN
  Administered 2018-03-19 – 2018-03-21 (×9): 10 mg via ORAL
  Filled 2018-03-19 (×10): qty 2

## 2018-03-19 MED ORDER — DIVALPROEX SODIUM ER 500 MG PO TB24
1000.0000 mg | ORAL_TABLET | Freq: Every day | ORAL | Status: DC
  Filled 2018-03-19 (×2): qty 2

## 2018-03-19 MED ORDER — ONDANSETRON HCL 4 MG PO TABS
4.0000 mg | ORAL_TABLET | Freq: Four times a day (QID) | ORAL | Status: DC | PRN
  Administered 2018-03-21: 4 mg via ORAL
  Filled 2018-03-19: qty 1

## 2018-03-19 MED ORDER — OXYCODONE HCL 5 MG PO TABS: 10.0000 mg | ORAL_TABLET | ORAL | Status: DC

## 2018-03-19 MED ORDER — BUDESONIDE 0.25 MG/2ML IN SUSP
0.2500 mg | Freq: Two times a day (BID) | RESPIRATORY_TRACT | Status: DC
  Administered 2018-03-19 – 2018-03-21 (×5): 0.25 mg via RESPIRATORY_TRACT
  Filled 2018-03-19 (×5): qty 2

## 2018-03-19 MED ORDER — ACETAMINOPHEN 500 MG PO TABS
1000.0000 mg | ORAL_TABLET | ORAL | Status: DC
  Administered 2018-03-19 – 2018-03-21 (×7): 1000 mg via ORAL
  Filled 2018-03-19 (×7): qty 2

## 2018-03-19 MED ORDER — LOSARTAN POTASSIUM 50 MG PO TABS
25.0000 mg | ORAL_TABLET | Freq: Every day | ORAL | Status: DC
  Administered 2018-03-19 – 2018-03-21 (×3): 25 mg via ORAL
  Filled 2018-03-19 (×3): qty 1

## 2018-03-19 MED ORDER — OXYCODONE-ACETAMINOPHEN 5-325 MG PO TABS
1.0000 | ORAL_TABLET | ORAL | Status: DC | PRN
  Administered 2018-03-19: 1 via ORAL
  Filled 2018-03-19: qty 1

## 2018-03-19 MED ORDER — ATENOLOL 50 MG PO TABS
100.0000 mg | ORAL_TABLET | Freq: Every day | ORAL | Status: DC
  Administered 2018-03-19 – 2018-03-21 (×3): 100 mg via ORAL
  Filled 2018-03-19 (×3): qty 2

## 2018-03-19 MED ORDER — KETOROLAC TROMETHAMINE 30 MG/ML IJ SOLN
30.0000 mg | Freq: Three times a day (TID) | INTRAMUSCULAR | Status: DC | PRN
  Administered 2018-03-20 – 2018-03-21 (×3): 30 mg via INTRAVENOUS
  Filled 2018-03-19 (×3): qty 1

## 2018-03-19 MED ORDER — ALBUTEROL SULFATE (2.5 MG/3ML) 0.083% IN NEBU
2.5000 mg | INHALATION_SOLUTION | RESPIRATORY_TRACT | Status: DC | PRN
  Administered 2018-03-19 – 2018-03-20 (×2): 2.5 mg via RESPIRATORY_TRACT
  Filled 2018-03-19 (×2): qty 3

## 2018-03-19 MED ORDER — ACETAMINOPHEN 650 MG RE SUPP: 650.0000 mg | Freq: Four times a day (QID) | RECTAL | Status: DC | PRN

## 2018-03-19 MED ORDER — LIDOCAINE 5 % EX PTCH
1.0000 | MEDICATED_PATCH | Freq: Every day | CUTANEOUS | Status: DC | PRN
  Administered 2018-03-19 – 2018-03-21 (×3): 1 via TRANSDERMAL
  Filled 2018-03-19 (×3): qty 1

## 2018-03-19 MED ORDER — DIPHENHYDRAMINE HCL 25 MG PO CAPS
25.0000 mg | ORAL_CAPSULE | Freq: Three times a day (TID) | ORAL | Status: DC | PRN
  Administered 2018-03-19 – 2018-03-21 (×4): 25 mg via ORAL
  Filled 2018-03-19 (×4): qty 1

## 2018-03-19 MED ORDER — QUETIAPINE FUMARATE ER 400 MG PO TB24
400.0000 mg | ORAL_TABLET | Freq: Two times a day (BID) | ORAL | Status: DC
  Filled 2018-03-19 (×6): qty 1

## 2018-03-19 MED ORDER — DICLOFENAC SODIUM 1 % TD GEL
4.0000 g | Freq: Four times a day (QID) | TRANSDERMAL | Status: DC
  Administered 2018-03-19 – 2018-03-20 (×7): 4 g via TOPICAL
  Filled 2018-03-19 (×2): qty 100

## 2018-03-19 MED ORDER — ATORVASTATIN CALCIUM 20 MG PO TABS
20.0000 mg | ORAL_TABLET | Freq: Every day | ORAL | Status: DC
  Administered 2018-03-19 – 2018-03-20 (×2): 20 mg via ORAL
  Filled 2018-03-19 (×2): qty 1

## 2018-03-19 MED ORDER — METHOCARBAMOL 500 MG PO TABS
1000.0000 mg | ORAL_TABLET | Freq: Four times a day (QID) | ORAL | Status: DC | PRN
  Administered 2018-03-19 – 2018-03-21 (×8): 1000 mg via ORAL
  Filled 2018-03-19 (×9): qty 2

## 2018-03-19 MED ORDER — ONDANSETRON HCL 4 MG/2ML IJ SOLN
4.0000 mg | Freq: Four times a day (QID) | INTRAMUSCULAR | Status: DC | PRN
  Administered 2018-03-19 – 2018-03-21 (×6): 4 mg via INTRAVENOUS
  Filled 2018-03-19 (×6): qty 2

## 2018-03-19 MED ORDER — OXYCODONE HCL 5 MG PO TABS
10.0000 mg | ORAL_TABLET | ORAL | Status: DC | PRN
  Administered 2018-03-19: 10 mg via ORAL
  Filled 2018-03-19: qty 2

## 2018-03-19 NOTE — Progress Notes (Signed)
Patient refuses depakote and seroquel at this time.  States, "Don't want any of my psych meds right now.  Need to get my pain under control."

## 2018-03-19 NOTE — Progress Notes (Signed)
Spoke with pt placement in regards to inpatient orders placed for a Med-Surg bed requiring a 2 night or more inpatient stay.  Pt pinned to 5W and awaiting approval.

## 2018-03-19 NOTE — Evaluation (Addendum)
Physical Therapy Evaluation Patient Details Name: Linda Mercado MRN: [id number] DOB: 1966/07/22 Today's Date: 03/19/2018   History of Present Illness  Pt is a 52 y.o. F with significant PMH of diabetes, hypertension, as well as chronic back pain after a severe fall from a ladder several years ago s/p intrathecal pump in right lower abdomen with fentanyl and clonidine scheduled infusions. Presented on 8/28 after falling due to "legs giving up," hitting her head. CT of the head and neck and thoracic, cervical and lumbar spine were negative for fractures.    Clinical Impression  Patient presenting with acute on chronic back pain. Currently displaying significantly decreased functional mobility secondary to back pain, functional strength deficits, and diminished activity tolerance. Requiring significantly increased time and effort to progress from bed to chair with two person moderate assistance. Educated on pain science and importance of mobility. Based on current status and history of falling, patient presents as high fall risk. Introduced topic of short term rehab with patient and patient daughter. Patient may progress once pain is better controlled so will continue to progress mobility.     Follow Up Recommendations SNF    Equipment Recommendations  Other (comment)(TBD)    Recommendations for Other Services       Precautions / Restrictions Precautions Precautions: Fall Precaution Comments: back precautions for comfort, intrathecal pump Restrictions Weight Bearing Restrictions: No      Mobility  Bed Mobility Overal bed mobility: Needs Assistance Bed Mobility: Rolling;Sidelying to Sit Rolling: Min assist Sidelying to sit: Mod assist       General bed mobility comments: mod assist for progress legs to edge of bed. use of bed rail to push up  Transfers Overall transfer level: Needs assistance Equipment used: 2 person hand held assist Transfers: Sit to/from [employer] Sit to Stand: Min assist;+2 physical assistance Stand pivot transfers: Mod assist;+2 physical assistance       General transfer comment: Patient able to stand from elevated surface with min assist but required mod +2 assist for transferring from bed to chair with manual assistance for movement initiation of RLE.   Ambulation/Gait             General Gait Details: deferred secondary to pain  Stairs            Wheelchair Mobility    Modified Rankin (Stroke Patients Only)       Balance Overall balance assessment: Needs assistance Sitting-balance support: No upper extremity supported;Feet unsupported Sitting balance-Leahy Scale: Good     Standing balance support: Bilateral upper extremity supported;During functional activity Standing balance-Leahy Scale: Fair                               Pertinent Vitals/Pain Pain Assessment: Faces Faces Pain Scale: Hurts worst Pain Location: back Pain Descriptors / Indicators: Grimacing;Moaning Pain Intervention(s): Limited activity within patient's tolerance;Monitored during session;Premedicated before session    Home Living Family/patient expects to be discharged to:: Private residence Living Arrangements: Children Available Help at Discharge: Family;Available PRN/intermittently           Home Equipment: Walker - 2 wheels      Prior Function Level of Independence: Needs assistance         Comments: Pt states she "does what she can." Dtr assists with ADL's/IADL's as needed on "bad days."     Hand Dominance        Extremity/Trunk Assessment   Upper Extremity  Assessment Upper Extremity Assessment: Overall WFL for tasks assessed    Lower Extremity Assessment Lower Extremity Assessment: RLE deficits/detail;LLE deficits/detail RLE Deficits / Details: difficult to assess secondary to pain. no knee buckle noted in standing LLE Deficits / Details: difficult to assess secondary to pain. no  knee buckle noted in standing.       Communication   Communication: No difficulties  Cognition Arousal/Alertness: Suspect due to medications Behavior During Therapy: WFL for tasks assessed/performed Overall Cognitive Status: Within Functional Limits for tasks assessed                                 General Comments: drowsy likely secondary to pain medication. eyes closed throughout most of session      General Comments General comments (skin integrity, edema, etc.): pt daughter in room    Exercises     Assessment/Plan    PT Assessment Patient needs continued PT services  PT Problem List Decreased strength;Decreased activity tolerance;Decreased balance;Decreased mobility;Pain       PT Treatment Interventions DME instruction;Gait training;Functional mobility training;Therapeutic activities;Balance training;Therapeutic exercise;Patient/family education    PT Goals (Current goals can be found in the Care Plan section)  Acute Rehab PT Goals Patient Stated Goal: "figure out what helps with pain." PT Goal Formulation: With patient/family Time For Goal Achievement: 04/02/18 Potential to Achieve Goals: Good    Frequency Min 3X/week   Barriers to discharge        Co-evaluation               AM-PAC PT "6 Clicks" Daily Activity  Outcome Measure Difficulty turning over in bed (including adjusting bedclothes, sheets and blankets)?: A Lot Difficulty moving from lying on back to sitting on the side of the bed? : Unable Difficulty sitting down on and standing up from a chair with arms (e.g., wheelchair, bedside commode, etc,.)?: Unable Help needed moving to and from a bed to chair (including a wheelchair)?: A Lot Help needed walking in hospital room?: A Lot Help needed climbing 3-5 steps with a railing? : Total 6 Click Score: 9    End of Session Equipment Utilized During Treatment: Gait belt Activity Tolerance: Patient limited by pain Patient left: in  chair;with call bell/phone within reach;with family/visitor present Nurse Communication: Mobility status PT Visit Diagnosis: Other abnormalities of gait and mobility (R26.89);Pain;Difficulty in walking, not elsewhere classified (R26.2);History of falling (Z91.81) Pain - part of body: (back)    Time: 9935-7017 PT Time Calculation (min) (ACUTE ONLY): 48 min   Charges:   PT Evaluation $PT Eval Moderate Complexity: 1 Mod PT Treatments $Therapeutic Activity: 23-37 mins        Ellamae Sia, PT, DPT Acute Rehabilitation Services  Pager: [place] 03/19/2018, 3:17 PM

## 2018-03-19 NOTE — Progress Notes (Signed)
Pt came to the floor at approx. 18:50.  Linda Mercado from [facility] came and checked to make sure fentanyl pump is functioning as it should.  Pump is working correctly per her report. Pt is alert and oriented and states she hopes to go home with home health tomorrow.  Pt was oriented to room, knows how to use call bell.  Report given to [facility].

## 2018-03-19 NOTE — Progress Notes (Signed)
PROGRESS NOTE    Linda Mercado  TJQ:[id number] DOB: 1966/05/15 DOA: 03/18/2018 PCP: Alfredo Martinez, PA   Brief Narrative:  52 year old woman with a history of diabetes, hypertension, as well as chronic back pain after a severe fall from a ladder several years ago, status post intrathecal pump in the right lower abdomen with fentanyl and clonidine scheduled infusions, as well as needed oxycodone for better pain control.  This is being followed at [facility]  by Dr. Sydell Axon.  She presented on 03/18/2018 after falling due to "legs giving up", hitting her head.  Patient is not aware of having had loss of consciousness.  2 weeks ago the patient had similar episodes.  On presentation, she reported that the pain was 10 out of 10, and has been more sedated than usual, as she has been needing more pain meds. In addition, she had Robaxin and her home dose of oxycodone, with mild control.  She also received IV Dilaudid and Toradol. CT of the head and neck and thoracic, cervical and lumbar spine were negative for fractures or acute abnormalities.  Assessment & Plan:   Active Problems:   Intractable back pain   HTN (hypertension)   Type 2 diabetes mellitus without complication (HCC)   Chronic pain syndrome   Presence of intrathecal pump   Hypokalemia  Intractable back pain, in a patient with chronic back pain after a severe fall from a ladder several years ago, status post intrathecal pump in the right lower abdomen with fentanyl and clonidine scheduled infusions, as well as needed oxycodone for better pain control.  This is being followed at [facility]  by Dr.Rourke.  At the ED she received Robaxin and her home dose of oxycodone, with mild control along with IV Dilaudid and Toradol. CT of the head and neck and T and L  plain films were negative for fractures or acute abnormalities. Unfortunately, her intrathecal pump will need adjustment, which is done as outpatient at the pain clinic.  No inpatient consultations  are provided by pain clinic here at [facility].  Will try to reach Dr. Sydell Axon at [phone] to determine if there is any chance that the patient can be transferred to [facility] for continuation of care in view of inability to achieve pain control here  In the interim, will adjust some of her existing medications.  Oxycodone 10 mg every 4 hours, change Tylenol to 1000 mg 3 times daily. Of note, the patient is also on Voltaren, Lidoderm, and Robaxin, to continue for now. Cardiac monitoring in view of his significant amount of pain medication she is on.  Type II Diabetes Current blood sugar level is  112, not on meds  No results found for: HGBA1C Continue to monitor her sugars  Hypertension BP 127/68   Pulse 54    Continue home anti-hypertensive medications Cozaar and atenolol   Hyperlipidemia Continue home statins  Anxiety-depression Continue Seroquel Continue Depakote    DVT prophylaxis: SCD Code Status: Full code   Family Communication: Discussed with daughter at bedside  Disposition Plan: Undetermined at this time, may need transfer to [facility] for pain control issues  Consultants:   WIll call Dr. Gala Romney, at [facility], [place], [place], to discuss the possibility of transfer to biopsies for continuation of care, versus discharge, and follow-up as an outpatient at the pain clinic.  Procedures:  None  Antimicrobials:   None   Subjective: Patient continues to be much in pain, "my IT pump is not giving me enough  pain control "she feels very lethargic.  She denies any headaches, vision changes, dysphagia, no acute mental status are noted.  She denies any nausea or vomiting.  She denies any chest pain or palpitations.  She denies any shortness of breath or cough.  No fever chills or night sweats.  She is somewhat tender in the area of the IT pump, which is chronic.  Is any dysuria, gross hematuria.  She denies any lower extremity swelling, or calf pain.  Patient is very tearful at  this time, and does not wish to provide any further information.  Objective: Vitals:   03/19/18 0555 03/19/18 0609 03/19/18 0800 03/19/18 0850  BP: 137/75  127/68   Pulse: (!) 45  (!) 54   Resp: 16  12   Temp:   98.5 F (36.9 C)   TempSrc:      SpO2: 100%  100% 98%  Weight:  60.8 kg    Height:  5\' 2"  (1.575 m)      Intake/Output Summary (Last 24 hours) at 03/19/2018 1212 Last data filed at 03/19/2018 0900 Gross per 24 hour  Intake 500.94 ml  Output 200 ml  Net 300.94 ml   Filed Weights   03/19/18 0609  Weight: 60.8 kg    Examination:  General exam: Appears very uncomfortable and tearful  Respiratory system: Clear to auscultation. Respiratory effort normal. Cardiovascular system: S1 & S2 heard, RRR. No JVD, murmurs, rubs, gallops or clicks. No pedal edema. Gastrointestinal system: Abdomen is nondistended, soft, chronically tender at the RLQ at the pump site, no erythema noted . No organomegaly or masses felt. Normal bowel sounds heard. Central nervous system: Alert and oriented. No focal neurological deficits. Extremities: Symmetric 5 x 5 power. Skin: No rashes, lesions or ulcers Psychiatry: Judgement and insight appear normal. Mood and affect are depressed, tearful and anxious.   Data Reviewed: I have personally reviewed following labs and imaging studies  CBC: Recent Labs  Lab 03/18/18 2019 03/19/18 0527  WBC 6.7 7.8  HGB 12.7 12.4  HCT 40.4 40.4  MCV 89.0 90.0  PLT 260 893   Basic Metabolic Panel: Recent Labs  Lab 03/18/18 2137 03/19/18 0527  NA 141 141  K 3.4* 4.2  CL 108 107  CO2 25 26  GLUCOSE 80 112*  BUN 10 11  CREATININE 0.76 0.89  CALCIUM 8.5* 9.0  MG 1.8 2.0  PHOS 3.6 5.2*   GFR: Estimated Creatinine Clearance: 63.5 mL/min (by C-G formula based on SCr of 0.89 mg/dL). Liver Function Tests: Recent Labs  Lab 03/19/18 0527  AST 19  ALT 16  ALKPHOS 67  BILITOT 0.7  PROT 7.2  ALBUMIN 3.9   No results for input(s): LIPASE, AMYLASE in  the last 168 hours. No results for input(s): AMMONIA in the last 168 hours. Coagulation Profile: No results for input(s): INR, PROTIME in the last 168 hours. Cardiac Enzymes: No results for input(s): CKTOTAL, CKMB, CKMBINDEX, TROPONINI in the last 168 hours. BNP (last 3 results) No results for input(s): PROBNP in the last 8760 hours. HbA1C: No results for input(s): HGBA1C in the last 72 hours. CBG: No results for input(s): GLUCAP in the last 168 hours. Lipid Profile: No results for input(s): CHOL, HDL, LDLCALC, TRIG, CHOLHDL, LDLDIRECT in the last 72 hours. Thyroid Function Tests: Recent Labs    03/19/18 0527  TSH 1.718   Anemia Panel: No results for input(s): VITAMINB12, FOLATE, FERRITIN, TIBC, IRON, RETICCTPCT in the last 72 hours. Sepsis Labs: No results for input(s):  PROCALCITON, LATICACIDVEN in the last 168 hours.  No results found for this or any previous visit (from the past 240 hour(s)).       Radiology Studies: Dg Thoracic Spine 2 View  Result Date: 03/18/2018 CLINICAL DATA:  Multiple recent falls. Pain. For prior spine surgeries. EXAM: THORACIC SPINE 2 VIEWS; LUMBAR SPINE - COMPLETE 4+ VIEW COMPARISON:  Thoracic and lumbar spine radiograph December 24, 2017 FINDINGS: Thoracic spine: There is no evidence of thoracic spine fracture. Alignment is normal. No other significant bone abnormalities are identified. Punctate metallic foreign body projecting at T8 canal, unchanged. Lumbar spine: Five non rib-bearing lumbar-type vertebral bodies are intact. No malalignment. Maintained lumbar lordosis. Status post L5-S1 PLIF with arthrodesis. Intact hardware without periprosthetic lucency. Moderate L4-5 facet arthropathy. No destructive bony lesions. Sacroiliac joints are symmetric. Included prevertebral and paraspinal soft tissue planes are non-suspicious. Battery stimulator plaque projects over RIGHT hip. Coil material projecting in lower abdomen most compatible with herniorrhaphy.  IMPRESSION: Thoracic spine: No fracture deformity, malalignment or dance degenerative change for age. Lumbar spine: No fracture deformity or malalignment. L5-S1 PLIF with arthrodesis. Electronically Signed   By: Elon Alas M.D.   On: 03/18/2018 17:50   Dg Lumbar Spine Complete  Result Date: 03/18/2018 CLINICAL DATA:  Multiple recent falls. Pain. For prior spine surgeries. EXAM: THORACIC SPINE 2 VIEWS; LUMBAR SPINE - COMPLETE 4+ VIEW COMPARISON:  Thoracic and lumbar spine radiograph December 24, 2017 FINDINGS: Thoracic spine: There is no evidence of thoracic spine fracture. Alignment is normal. No other significant bone abnormalities are identified. Punctate metallic foreign body projecting at T8 canal, unchanged. Lumbar spine: Five non rib-bearing lumbar-type vertebral bodies are intact. No malalignment. Maintained lumbar lordosis. Status post L5-S1 PLIF with arthrodesis. Intact hardware without periprosthetic lucency. Moderate L4-5 facet arthropathy. No destructive bony lesions. Sacroiliac joints are symmetric. Included prevertebral and paraspinal soft tissue planes are non-suspicious. Battery stimulator plaque projects over RIGHT hip. Coil material projecting in lower abdomen most compatible with herniorrhaphy. IMPRESSION: Thoracic spine: No fracture deformity, malalignment or dance degenerative change for age. Lumbar spine: No fracture deformity or malalignment. L5-S1 PLIF with arthrodesis. Electronically Signed   By: Elon Alas M.D.   On: 03/18/2018 17:50   Dg Shoulder Right  Result Date: 03/18/2018 CLINICAL DATA:  Multiple recent falls, fell today, fell onto LEFT side, superior RIGHT shoulder pain EXAM: RIGHT SHOULDER - 2+ VIEW COMPARISON:  None FINDINGS: Osseous mineralization normal. AC joint alignment normal. No acute fracture, dislocation or bone destruction. Visualized ribs unremarkable. IMPRESSION: Normal exam. Electronically Signed   By: Lavonia Dana M.D.   On: 03/18/2018 17:53   Ct  Head Wo Contrast  Result Date: 03/18/2018 CLINICAL DATA:  52 year old female with a history of fall EXAM: CT HEAD WITHOUT CONTRAST CT CERVICAL SPINE WITHOUT CONTRAST TECHNIQUE: Multidetector CT imaging of the head and cervical spine was performed following the standard protocol without intravenous contrast. Multiplanar CT image reconstructions of the cervical spine were also generated. COMPARISON:  None. FINDINGS: CT HEAD FINDINGS Brain: No acute intracranial hemorrhage. No midline shift or mass effect. Gray-white differentiation maintained. Unremarkable appearance of the ventricular system. Focal hypodensity in the right basal ganglia, either a perivascular space or region of focal demyelination. Vascular: Unremarkable. Skull: No acute fracture.  No aggressive bone lesion identified. Sinuses/Orbits: Unremarkable appearance of the orbits. Mastoid air cells clear. No middle ear effusion. No significant sinus disease. Other: None CT CERVICAL SPINE FINDINGS Alignment: Apex right curvature, potentially positional. No subluxation. No anterolisthesis or retrolisthesis. Reversal  the normal cervical lordosis. Skull base and vertebrae: No skull base fracture. Vertebral bodies demonstrate no acute fracture. No facet fracture. Facets maintain alignment. Soft tissues and spinal canal: No canal hematoma. Unremarkable appearance of the soft tissues of the cervical region. Disc levels:  Disc space narrowing throughout the cervical spine. Posterior disc osteophyte complex at C3-C4 without significant canal narrowing. Posterior disc osteophyte complex at C5-C6 without significant canal narrowing. Posterior disc osteophyte complex at C6-C7, eccentric to the right with right-sided uncovertebral joint disease contributing to right foraminal narrowing. Upper chest: Unremarkable Other: None IMPRESSION: Head CT: No acute intracranial abnormality. Cervical CT: No acute fracture or malalignment of the cervical spine. Mild degenerative  disc disease, with mild right foraminal narrowing at C6-C7 secondary to uncovertebral joint disease. Electronically Signed   By: Corrie Mckusick D.O.   On: 03/18/2018 16:28   Ct Cervical Spine Wo Contrast  Result Date: 03/18/2018 CLINICAL DATA:  52 year old female with a history of fall EXAM: CT HEAD WITHOUT CONTRAST CT CERVICAL SPINE WITHOUT CONTRAST TECHNIQUE: Multidetector CT imaging of the head and cervical spine was performed following the standard protocol without intravenous contrast. Multiplanar CT image reconstructions of the cervical spine were also generated. COMPARISON:  None. FINDINGS: CT HEAD FINDINGS Brain: No acute intracranial hemorrhage. No midline shift or mass effect. Gray-white differentiation maintained. Unremarkable appearance of the ventricular system. Focal hypodensity in the right basal ganglia, either a perivascular space or region of focal demyelination. Vascular: Unremarkable. Skull: No acute fracture.  No aggressive bone lesion identified. Sinuses/Orbits: Unremarkable appearance of the orbits. Mastoid air cells clear. No middle ear effusion. No significant sinus disease. Other: None CT CERVICAL SPINE FINDINGS Alignment: Apex right curvature, potentially positional. No subluxation. No anterolisthesis or retrolisthesis. Reversal the normal cervical lordosis. Skull base and vertebrae: No skull base fracture. Vertebral bodies demonstrate no acute fracture. No facet fracture. Facets maintain alignment. Soft tissues and spinal canal: No canal hematoma. Unremarkable appearance of the soft tissues of the cervical region. Disc levels:  Disc space narrowing throughout the cervical spine. Posterior disc osteophyte complex at C3-C4 without significant canal narrowing. Posterior disc osteophyte complex at C5-C6 without significant canal narrowing. Posterior disc osteophyte complex at C6-C7, eccentric to the right with right-sided uncovertebral joint disease contributing to right foraminal  narrowing. Upper chest: Unremarkable Other: None IMPRESSION: Head CT: No acute intracranial abnormality. Cervical CT: No acute fracture or malalignment of the cervical spine. Mild degenerative disc disease, with mild right foraminal narrowing at C6-C7 secondary to uncovertebral joint disease. Electronically Signed   By: Corrie Mckusick D.O.   On: 03/18/2018 16:28   Ct Thoracic Spine Wo Contrast  Result Date: 03/18/2018 CLINICAL DATA:  [place] from bed.  History of narcotic pump. EXAM: CT THORACIC AND LUMBAR SPINE WITHOUT CONTRAST TECHNIQUE: Multidetector CT imaging of the thoracic and lumbar spine was performed without contrast. Multiplanar CT image reconstructions were also generated. COMPARISON:  Thoracal lumbar spine radiographs March 18, 2018 FINDINGS: CT THORACIC SPINE FINDINGS ALIGNMENT: Maintained thoracic kyphosis. No malalignment. VERTEBRAE: Vertebral bodies and posterior elements are intact. Intervertebral disc heights preserved, mild ventral endplate spurring. No destructive bony lesions. PARASPINAL AND OTHER SOFT TISSUES: Nonacute. Heterogeneous thyroid without dominant nodule. DISC LEVELS: Contiguous epidural catheter with tip at posterior T9 ventral canal. No disc bulge, canal stenosis nor neural foraminal narrowing. CT LUMBAR SPINE FINDINGS SEGMENTATION: For the purposes of this report the last well-formed intervertebral disc space is reported as L5-S1. ALIGNMENT: Maintained lumbar lordosis. No malalignment. Mild broad dextroscoliosis on this  nonweightbearing examination. VERTEBRAE: Vertebral bodies and posterior elements are intact. Status post L5-S1 PLIF with intact well-seated hardware. Incorporated posterior bone graft material. L5-S1 arthrosis. Nonsurgically altered disc heights preserved. No destructive bony lesions. PARASPINAL AND OTHER SOFT TISSUES: Contiguous epidural catheter via LEFT L2-3 interlaminar approach. Punctate RIGHT lower pole nephrolithiasis. At least small volume retained large  bowel stool. DISC LEVELS: L1-2: No disc bulge, canal stenosis nor neural foraminal narrowing. L2-3: No disc bulge. Mild LEFT facet arthropathy without canal stenosis or neural foraminal narrowing. L3-4: Small broad-based disc bulge. Mild RIGHT and moderate LEFT facet arthropathy and ligamentum flavum redundancy without canal stenosis or neural foraminal narrowing. L4-5: Small broad-based disc bulge, small LEFT subarticular disc protrusion. Mild facet arthropathy without canal stenosis. Mild LEFT neural foraminal narrowing. L5-S1: PLIF. No canal stenosis. Severe osseous LEFT neural foraminal narrowing. IMPRESSION: CT THORACIC SPINE IMPRESSION 1. No fracture, malalignment or advanced degenerative change. 2. Epidural catheter tip at T9. CT LUMBAR SPINE IMPRESSION 1. No fracture or malalignment.  L5-S1 PLIF with arthrodesis. 2. Epidural catheter via LEFT L2-3 interlaminar approach. 3. No canal stenosis.  Severe LEFT L5-S1 neural foraminal narrowing. Electronically Signed   By: Elon Alas M.D.   On: 03/18/2018 20:10   Ct Lumbar Spine Wo Contrast  Result Date: 03/18/2018 CLINICAL DATA:  [place] from bed.  History of narcotic pump. EXAM: CT THORACIC AND LUMBAR SPINE WITHOUT CONTRAST TECHNIQUE: Multidetector CT imaging of the thoracic and lumbar spine was performed without contrast. Multiplanar CT image reconstructions were also generated. COMPARISON:  Thoracal lumbar spine radiographs March 18, 2018 FINDINGS: CT THORACIC SPINE FINDINGS ALIGNMENT: Maintained thoracic kyphosis. No malalignment. VERTEBRAE: Vertebral bodies and posterior elements are intact. Intervertebral disc heights preserved, mild ventral endplate spurring. No destructive bony lesions. PARASPINAL AND OTHER SOFT TISSUES: Nonacute. Heterogeneous thyroid without dominant nodule. DISC LEVELS: Contiguous epidural catheter with tip at posterior T9 ventral canal. No disc bulge, canal stenosis nor neural foraminal narrowing. CT LUMBAR SPINE FINDINGS  SEGMENTATION: For the purposes of this report the last well-formed intervertebral disc space is reported as L5-S1. ALIGNMENT: Maintained lumbar lordosis. No malalignment. Mild broad dextroscoliosis on this nonweightbearing examination. VERTEBRAE: Vertebral bodies and posterior elements are intact. Status post L5-S1 PLIF with intact well-seated hardware. Incorporated posterior bone graft material. L5-S1 arthrosis. Nonsurgically altered disc heights preserved. No destructive bony lesions. PARASPINAL AND OTHER SOFT TISSUES: Contiguous epidural catheter via LEFT L2-3 interlaminar approach. Punctate RIGHT lower pole nephrolithiasis. At least small volume retained large bowel stool. DISC LEVELS: L1-2: No disc bulge, canal stenosis nor neural foraminal narrowing. L2-3: No disc bulge. Mild LEFT facet arthropathy without canal stenosis or neural foraminal narrowing. L3-4: Small broad-based disc bulge. Mild RIGHT and moderate LEFT facet arthropathy and ligamentum flavum redundancy without canal stenosis or neural foraminal narrowing. L4-5: Small broad-based disc bulge, small LEFT subarticular disc protrusion. Mild facet arthropathy without canal stenosis. Mild LEFT neural foraminal narrowing. L5-S1: PLIF. No canal stenosis. Severe osseous LEFT neural foraminal narrowing. IMPRESSION: CT THORACIC SPINE IMPRESSION 1. No fracture, malalignment or advanced degenerative change. 2. Epidural catheter tip at T9. CT LUMBAR SPINE IMPRESSION 1. No fracture or malalignment.  L5-S1 PLIF with arthrodesis. 2. Epidural catheter via LEFT L2-3 interlaminar approach. 3. No canal stenosis.  Severe LEFT L5-S1 neural foraminal narrowing. Electronically Signed   By: Elon Alas M.D.   On: 03/18/2018 20:10        Scheduled Meds: . acetaminophen  1,000 mg Oral [id number]   Or  . acetaminophen  650 mg Rectal [id number]  .  atenolol  100 mg Oral Daily  . atorvastatin  20 mg Oral QHS  . budesonide  0.25 mg Inhalation BID  . diclofenac  sodium  4 g Topical QID  . divalproex  1,000 mg Oral Daily  . losartan  25 mg Oral Daily  . QUEtiapine  400 mg Oral BID   Continuous Infusions:   LOS: 0 days         Sharene Butters, MD [facility] Pager [phone]  If 7PM-7AM, please contact night-coverage [email or web address] Password [email or web address] 03/19/2018, 12:12 PM

## 2018-03-19 NOTE — Progress Notes (Signed)
650mg  of Tylenol given at 0445. Barcode not readable for scanning.

## 2018-03-19 NOTE — Progress Notes (Signed)
Occupational Therapy Evaluation Patient Details Name: Linda Mercado MRN: [id number] DOB: 1966-07-04 Today's Date: 03/19/2018    History of Present Illness Pt is a 52 y.o. F with significant PMH of diabetes, hypertension, as well as chronic back pain after a severe fall from a ladder several years ago s/p intrathecal pump in right lower abdomen with fentanyl and clonidine scheduled infusions. Presented on 8/28 after falling due to "legs giving up," hitting her head. CT of the head and neck and thoracic, cervical and lumbar spine were negative for fractures.     Clinical Impression   PTA, pt lived at home with her family and ambulated using a 3 prong cane and required assistance with tub trasnfers and LB ADL. Pt states she falls @ 20 times/month and that she has had recurrent falls due to BLE leg weakness for @ 5 years.  Pt significantly limited this session due to pian, although she was premedicated. Long discussion with pt regarding the need to most likely use her w/c as her primary mode of mobility due to recurrent falls. In order to safely DC home, pt will need to be able to demonstrate the ability to function at a wc level. If functional mobility and ability to complete ADL do not improve once pain is controlled, she will need rehab at SNF to return to PLOF. Pt/daughter verbalized understanding. Will follow acutely to facilitate DC to next venue of care.      Follow Up Recommendations  Supervision/Assistance - 24 hour;SNF;Home health OT(pending progress)    Equipment Recommendations  Tub/shower bench    Recommendations for Other Services  Social work consult for community resources - information on ramps     Precautions / Restrictions Precautions Precautions: Fall Precaution Comments: back precautions for comfort, intrathecal pump Restrictions Weight Bearing Restrictions: No      Mobility Bed Mobility Overal bed mobility: Needs Assistance             General bed mobility  comments: pt declining bed mobility due to moving earlier with PT  Transfers                 General transfer comment: Pt declined due to pain    Balance                                           ADL either performed or assessed with clinical judgement   ADL Overall ADL's : Needs assistance/impaired     Grooming: Minimal assistance;Bed level   Upper Body Bathing: Minimal assistance;Bed level   Lower Body Bathing: Maximal assistance;Bed level   Upper Body Dressing : Moderate assistance;Bed level   Lower Body Dressing: Maximal assistance;Bed level               Functional mobility during ADLs: (unable due to pain) General ADL Comments: May benefit from AE; may benefit from tub bench Long discussion regarding need to mobilize at wc level due to frequent falls. Pt states home is not wc accessible. Pt rents. Made recommendation that they consider looking at wc accessible homes.      Vision         Perception     Praxis      Pertinent Vitals/Pain Pain Assessment: 0-10 Pain Score: 10-Worst pain ever Pain Location: back Pain Descriptors / Indicators: Grimacing;Moaning Pain Intervention(s): Limited activity within patient's tolerance;Monitored during session;Premedicated before session  Hand Dominance Right   Extremity/Trunk Assessment Upper Extremity Assessment Upper Extremity Assessment: Overall WFL for tasks assessed(however pain in back with arms lifted greater than 60 FF)   Lower Extremity Assessment Lower Extremity Assessment: Defer to PT evaluation(increased pain with any movement) RLE Deficits / Details: difficult to assess secondary to pain. no knee buckle noted in standing LLE Deficits / Details: difficult to assess secondary to pain. no knee buckle noted in standing.   Cervical / Trunk Assessment Cervical / Trunk Assessment: Other exceptions Cervical / Trunk Exceptions: multiple back fusions/surgerys   Communication  Communication Communication: No difficulties   Cognition Arousal/Alertness: Suspect due to medications Behavior During Therapy: WFL for tasks assessed/performed Overall Cognitive Status: Within Functional Limits for tasks assessed                                     General Comments  discussion regarding PLOF and need to return to PLOF in order to DC home     Exercises     Shoulder Instructions      Home Living Family/patient expects to be discharged to:: Private residence Living Arrangements: Children Available Help at Discharge: Family;Available PRN/intermittently Type of Home: House Home Access: Stairs to enter [employer] of Steps: 5 Entrance Stairs-Rails: Right;Left;Can reach both Home Layout: One level     Bathroom Shower/Tub: Corporate investment banker: Standard Bathroom Accessibility: Yes How Accessible: Accessible via walker Home Equipment: Cane - single point;Cane - quad;Wheelchair - manual;Bedside commode          Prior Functioning/Environment Level of Independence: Needs assistance  Gait / Transfers Assistance Needed: pt ambulates using 3 prong cane; has wc that she uses for community mobility; has @ 20 falls/month ADL's / Homemaking Assistance Needed: children assist with LB ADL  and transfers into tub   Comments: Pt states she "does what she can." Dtr assists with ADL's/IADL's as needed on "bad days."        OT Problem List: Decreased strength;Decreased range of motion;Decreased activity tolerance;Impaired balance (sitting and/or standing);Decreased knowledge of use of DME or AE;Pain      OT Treatment/Interventions: Self-care/ADL training;Therapeutic exercise;DME and/or AE instruction;Therapeutic activities;Patient/family education;Balance training    OT Goals(Current goals can be found in the care plan section) Acute Rehab OT Goals Patient Stated Goal: to reduce pain OT Goal Formulation: With patient Time  For Goal Achievement: 04/02/18 Potential to Achieve Goals: Good  OT Frequency: Min 3X/week   Barriers to D/C:            Co-evaluation              AM-PAC PT "6 Clicks" Daily Activity     Outcome Measure Help from another person eating meals?: None Help from another person taking care of personal grooming?: A Little Help from another person toileting, which includes using toliet, bedpan, or urinal?: A Lot Help from another person bathing (including washing, rinsing, drying)?: A Lot Help from another person to put on and taking off regular upper body clothing?: A Lot Help from another person to put on and taking off regular lower body clothing?: A Lot 6 Click Score: 15   End of Session Nurse Communication: Mobility status  Activity Tolerance: Patient limited by pain Patient left: in bed;with call bell/phone within reach;with family/visitor present  OT Visit Diagnosis: Other abnormalities of gait and mobility (R26.89);Muscle weakness (generalized) (M62.81);Repeated falls (R29.6);Pain Pain - part of  body: Leg;Hip;Knee(back)                Time: 3225-6720 OT Time Calculation (min): 35 min Charges:  OT General Charges $OT Visit: 1 Visit OT Evaluation $OT Eval Moderate Complexity: 1 Mod OT Treatments $Self Care/Home Management : 8-22 mins  Maurie Boettcher, OT/L  OT Clinical Specialist [phone]   [facility] 03/19/2018, 4:22 PM

## 2018-03-20 ENCOUNTER — Encounter (HOSPITAL_COMMUNITY): Payer: Self-pay | Admitting: Internal Medicine

## 2018-03-20 DIAGNOSIS — R402250 Coma scale, best verbal response, oriented, unspecified time: Secondary | ICD-10-CM | POA: Diagnosis not present

## 2018-03-20 DIAGNOSIS — E785 Hyperlipidemia, unspecified: Secondary | ICD-10-CM | POA: Diagnosis present

## 2018-03-20 DIAGNOSIS — Z9689 Presence of other specified functional implants: Secondary | ICD-10-CM | POA: Diagnosis not present

## 2018-03-20 DIAGNOSIS — W06XXXA Fall from bed, initial encounter: Secondary | ICD-10-CM | POA: Diagnosis present

## 2018-03-20 DIAGNOSIS — I10 Essential (primary) hypertension: Secondary | ICD-10-CM | POA: Diagnosis present

## 2018-03-20 DIAGNOSIS — W228XXA Striking against or struck by other objects, initial encounter: Secondary | ICD-10-CM | POA: Diagnosis present

## 2018-03-20 DIAGNOSIS — G894 Chronic pain syndrome: Secondary | ICD-10-CM | POA: Diagnosis present

## 2018-03-20 DIAGNOSIS — F419 Anxiety disorder, unspecified: Secondary | ICD-10-CM | POA: Diagnosis present

## 2018-03-20 DIAGNOSIS — Z5329 Procedure and treatment not carried out because of patient's decision for other reasons: Secondary | ICD-10-CM | POA: Diagnosis not present

## 2018-03-20 DIAGNOSIS — M542 Cervicalgia: Secondary | ICD-10-CM | POA: Diagnosis present

## 2018-03-20 DIAGNOSIS — R001 Bradycardia, unspecified: Secondary | ICD-10-CM | POA: Diagnosis present

## 2018-03-20 DIAGNOSIS — R102 Pelvic and perineal pain: Secondary | ICD-10-CM | POA: Diagnosis present

## 2018-03-20 DIAGNOSIS — E119 Type 2 diabetes mellitus without complications: Secondary | ICD-10-CM | POA: Diagnosis present

## 2018-03-20 DIAGNOSIS — Z91048 Other nonmedicinal substance allergy status: Secondary | ICD-10-CM | POA: Diagnosis not present

## 2018-03-20 DIAGNOSIS — R52 Pain, unspecified: Secondary | ICD-10-CM | POA: Diagnosis present

## 2018-03-20 DIAGNOSIS — Z91040 Latex allergy status: Secondary | ICD-10-CM | POA: Diagnosis not present

## 2018-03-20 DIAGNOSIS — Z888 Allergy status to other drugs, medicaments and biological substances status: Secondary | ICD-10-CM | POA: Diagnosis not present

## 2018-03-20 DIAGNOSIS — R296 Repeated falls: Secondary | ICD-10-CM | POA: Diagnosis present

## 2018-03-20 DIAGNOSIS — Z9119 Patient's noncompliance with other medical treatment and regimen: Secondary | ICD-10-CM | POA: Diagnosis not present

## 2018-03-20 DIAGNOSIS — Z978 Presence of other specified devices: Secondary | ICD-10-CM | POA: Diagnosis not present

## 2018-03-20 DIAGNOSIS — E876 Hypokalemia: Secondary | ICD-10-CM | POA: Diagnosis present

## 2018-03-20 DIAGNOSIS — Y92003 Bedroom of unspecified non-institutional (private) residence as the place of occurrence of the external cause: Secondary | ICD-10-CM | POA: Diagnosis not present

## 2018-03-20 DIAGNOSIS — R402360 Coma scale, best motor response, obeys commands, unspecified time: Secondary | ICD-10-CM | POA: Diagnosis not present

## 2018-03-20 DIAGNOSIS — R51 Headache: Secondary | ICD-10-CM | POA: Diagnosis present

## 2018-03-20 DIAGNOSIS — F319 Bipolar disorder, unspecified: Secondary | ICD-10-CM | POA: Diagnosis present

## 2018-03-20 DIAGNOSIS — R402140 Coma scale, eyes open, spontaneous, unspecified time: Secondary | ICD-10-CM | POA: Diagnosis not present

## 2018-03-20 DIAGNOSIS — M549 Dorsalgia, unspecified: Secondary | ICD-10-CM | POA: Diagnosis present

## 2018-03-20 DIAGNOSIS — M4056 Lordosis, unspecified, lumbar region: Secondary | ICD-10-CM | POA: Diagnosis present

## 2018-03-20 DIAGNOSIS — R310 Gross hematuria: Secondary | ICD-10-CM | POA: Diagnosis present

## 2018-03-20 LAB — GLUCOSE, CAPILLARY
Glucose-Capillary: 203 mg/dL — ABNORMAL HIGH (ref 70–99)
Glucose-Capillary: 86 mg/dL (ref 70–99)

## 2018-03-20 LAB — HEMOGLOBIN A1C
Hgb A1c MFr Bld: 5.7 % — ABNORMAL HIGH (ref 4.8–5.6)
Mean Plasma Glucose: 116.89 mg/dL

## 2018-03-20 MED ORDER — TRAMADOL HCL 50 MG PO TABS
50.0000 mg | ORAL_TABLET | Freq: Four times a day (QID) | ORAL | Status: DC | PRN
  Administered 2018-03-20 – 2018-03-21 (×2): 50 mg via ORAL
  Filled 2018-03-20 (×2): qty 1

## 2018-03-20 MED ORDER — CLONAZEPAM 0.5 MG PO TABS
0.5000 mg | ORAL_TABLET | Freq: Three times a day (TID) | ORAL | Status: DC | PRN
  Administered 2018-03-20 – 2018-03-21 (×3): 0.5 mg via ORAL
  Filled 2018-03-20 (×3): qty 1

## 2018-03-20 MED ORDER — INSULIN ASPART 100 UNIT/ML ~~LOC~~ SOLN
0.0000 [IU] | Freq: Three times a day (TID) | SUBCUTANEOUS | Status: DC
  Administered 2018-03-20: 3 [IU] via SUBCUTANEOUS

## 2018-03-20 NOTE — Progress Notes (Signed)
   03/20/18 1200  Clinical Encounter Type  Visited With Patient  Visit Type Initial;Spiritual support  Referral From Nurse  Spiritual Encounters  Spiritual Needs Prayer;Emotional  Stress Factors  Patient Stress Factors Health changes;Exhausted;Family relationships;Major life changes  PT affirmed her need to get better both emotionally and physically. PT was thankful for Chaplain leading in prayer.

## 2018-03-20 NOTE — Progress Notes (Signed)
Patient has been asking for Dilaudid 0.5 mg IV and pt encouraged  to take more PO meds for better pain control

## 2018-03-20 NOTE — Progress Notes (Addendum)
Occupational Therapy Treatment Patient Details Name: Linda Mercado MRN: [id number] DOB: [birth year] Today's Date: 03/20/2018    History of present illness Pt is a 52 y.o. F with significant PMH of diabetes, hypertension, as well as chronic back pain after a severe fall from a ladder several years ago s/p intrathecal pump in right lower abdomen with fentanyl and clonidine scheduled infusions. Presented on 8/28 after falling due to "legs giving up," hitting her head. CT of the head and neck and thoracic, cervical and lumbar spine were negative for fractures.     OT comments  On entry to room, daughter feeding pt while pt in bed with HOB @ 45 degrees. Seen as co-treat with PT. Session took 42 min to move from bed to [facility] to recliner @ RW level. No physical A required to lift or lower pt. Steady A provided. Pt able to complete pericare after voiding with set up. Pt moaning throughout session and stated "I don't want to go to one of those places (referring to SNF) but only my dog will be with me at home". Daughter indicating people can be there to help. Feel pt will benefit from rehab at snf to facilitate return to PLOF.  If pt declines SNF, she will benefit from [facility], [place] [facility], tub bench and RW and will need S with all mobility at RW level/use of wc in community.  Will continue to follow acutely to facilitate safe DC to next venue of care.  HR beginning of session 55; end of session 62; BP after session 167/77  Follow Up Recommendations  Supervision/Assistance - 24 hour;SNF;Home health OT ; [place] Recommendations  Tub/shower bench;Other (comment)(RW)    Recommendations for Other Services      Precautions / Restrictions Precautions Precautions: Fall Precaution Comments: back precautions for comfort, intrathecal pump       Mobility Bed Mobility Overal bed mobility: Needs Assistance Bed Mobility: Sidelying to Sit Rolling: Supervision Sidelying to sit: Min assist       General  bed mobility comments: increased time  Transfers Overall transfer level: Needs assistance Equipment used: Rolling walker (2 wheeled) Transfers: Sit to/from Stand Sit to Stand: Min assist(steady A; no lifting required)              Balance Overall balance assessment: History of Falls                                         ADL either performed or assessed with clinical judgement   ADL Overall ADL's : Needs assistance/impaired                         Toilet Transfer: Minimal assistance;BSC;Ambulation;RW   Toileting- Clothing Manipulation and Hygiene: Sit to/from stand;Sitting/lateral lean;Set up;Supervision/safety Toileting - Clothing Manipulation Details (indicate cue type and reason): May benefit from AE for hygiene     Functional mobility during ADLs: Minimal assistance;Rolling walker;Cueing for safety General ADL Comments: May benefit form AE for LB ADL     Vision       Perception     Praxis      Cognition Arousal/Alertness: Lethargic;Suspect due to medications Behavior During Therapy: Anxious Overall Cognitive Status: Within Functional Limits for tasks assessed  Exercises     Shoulder Instructions       General Comments daughter present throughout session. Daughter feeding pt in bed while semi reclined. Educated daughter on importance of pt feeding herself and not trying to feed her mom when she appears lethargic from medicaiton dur ot risk of aspiration. Daughter verbalized understanding.     Pertinent Vitals/ Pain       Pain Assessment: 0-10 Pain Score: 10-Worst pain ever Pain Location: back Pain Descriptors / Indicators: Contraction;Crying;Discomfort;Grimacing;Guarding;Moaning;Sharp;Spasm Pain Intervention(s): Limited activity within patient's tolerance;Premedicated before session  Home Living                                          Prior  Functioning/Environment              Frequency  Min 3X/week        Progress Toward Goals  OT Goals(current goals can now be found in the care plan section)  Progress towards OT goals: Progressing toward goals  Acute Rehab OT Goals Patient Stated Goal: to reduce pain OT Goal Formulation: With patient Time For Goal Achievement: 04/02/18 Potential to Achieve Goals: Good ADL Goals Pt Will Perform Lower Body Dressing: with min assist;with adaptive equipment;sit to/from stand Pt Will Transfer to Toilet: with min guard assist;bedside commode;squat pivot transfer Pt Will Perform Toileting - Clothing Manipulation and hygiene: with modified independence;with adaptive equipment Additional ADL Goal #1: S wtih bed mobility for ADL  Plan Discharge plan remains appropriate    Co-evaluation    PT/OT/SLP Co-Evaluation/Treatment: Yes Reason for Co-Treatment: Necessary to address cognition/behavior during functional activity;To address functional/ADL transfers   OT goals addressed during session: ADL's and self-care      AM-PAC PT "6 Clicks" Daily Activity     Outcome Measure   Help from another person eating meals?: None Help from another person taking care of personal grooming?: A Little Help from another person toileting, which includes using toliet, bedpan, or urinal?: A Little Help from another person bathing (including washing, rinsing, drying)?: A Lot Help from another person to put on and taking off regular upper body clothing?: A Little Help from another person to put on and taking off regular lower body clothing?: A Lot 6 Click Score: 17    End of Session Equipment Utilized During Treatment: Gait belt;Rolling walker  OT Visit Diagnosis: Other abnormalities of gait and mobility (R26.89);Muscle weakness (generalized) (M62.81);Repeated falls (R29.6);Pain Pain - part of body: Leg;Hip;Knee(back)   Activity Tolerance Patient limited by pain   Patient Left in chair;with  call bell/phone within reach;with family/visitor present   Nurse Communication Mobility status;Other (comment)(need to use BSC/mobilize for toileting)        Time: 6294-7654 OT Time Calculation (min): 42 min  Charges: OT General Charges $OT Visit: 1 Visit OT Treatments $Self Care/Home Management : 8-22 mins  Maurie Boettcher, OT/L  OT Clinical Specialist [phone]    [facility] 03/20/2018, 12:14 PM

## 2018-03-20 NOTE — Care Management Note (Signed)
Case Management Note  Patient Details  Name: Linda Mercado MRN: [id number] Date of Birth: 12-23-1965  Subjective/Objective:            HTN, chronic back pain w flare        Action/Plan:  Spoke to patient and daughter extensively at bedside. Patient lives at home with 3 children, two are in high school during the day, oldest is out of the house during the day. The oldest prepares her mother for the day as best she can before she needs to leave then also assists her as needed at night. She states her baseline is significantly more independent than what she is now. She drives, and can ambulate in the home and cook. She has pain flares that lead to her falling at home frequently.  Spoke w CSW this am to discuss SNF and due to Fentanyl pump she cannot be placed.  Patient c/o severe pain at this time requesting additional pain medicine. Request made known to nurse. For home care they would like to have rollator and tub bench to maximize safety. Discussed HH. They will decide if they feel it will be helpful or too exhausting and lead to more pain and the potential of falls. They are interested in [facility] through Medicaid benefit and information about [facility]. They were provided with application.    Expected Discharge Date:  03/20/18               Expected Discharge Plan:     In-House Referral:  Clinical Social Work  Discharge planning Services  CM Consult  Post Acute Care Choice:    Choice offered to:     DME Arranged:    DME Agency:     HH Arranged:    HH Agency:     Status of Service:  In process, will continue to follow  If discussed at Long Length of Stay Meetings, dates discussed:    Additional Comments:  Carles Collet, RN 03/20/2018, 3:20 PM

## 2018-03-20 NOTE — Progress Notes (Signed)
Physical Therapy Treatment Patient Details Name: Linda Mercado MRN: [id number] DOB: 1966-02-11 Today's Date: 03/20/2018    History of Present Illness Pt is a 52 y.o. F with significant PMH of diabetes, hypertension, as well as chronic back pain after a severe fall from a ladder several years ago s/p intrathecal pump in right lower abdomen with fentanyl and clonidine scheduled infusions. Presented on 8/28 after falling due to "legs giving up," hitting her head. CT of the head and neck and thoracic, cervical and lumbar spine were negative for fractures.      PT Comments    PT attempting to see patient this AM, with patient requesting PT to return following pain meds. PT/OT co-treat to assess functional mobility and ability to return home. Patient's daughter present for session. Max motivational cueing for participation and completion of mobility tasks. Patient very vocal throughout session regarding high pain levels, but able to complete session and progress mobility. Will continue to recommend SNF at discharge to progress mobility and pain control as patient reports limited assistance in the home. Patient likely to decline SNF and will require HHPT, [place] aide and RW.    Follow Up Recommendations  Supervision/Assistance - 24 hour;SNF;Home health PT     Equipment Recommendations  Rolling walker with 5" wheels    Recommendations for Other Services       Precautions / Restrictions Precautions Precautions: Fall Precaution Comments: back precautions for comfort, intrathecal pump Restrictions Weight Bearing Restrictions: No    Mobility  Bed Mobility Overal bed mobility: Needs Assistance Bed Mobility: Sidelying to Sit Rolling: Supervision Sidelying to sit: Min assist       General bed mobility comments: increased time and effort - max motivational cueing  Transfers Overall transfer level: Needs assistance Equipment used: Rolling walker (2 wheeled) Transfers: Sit to/from Stand Sit to  Stand: Min assist         General transfer comment: no physical assist provided to power up from bedside or BSC - steadying provided; max cueing for hand/foot placement for safety  Ambulation/Gait Ambulation/Gait assistance: Min guard Gait Distance (Feet): 18 Feet Assistive device: Rolling walker (2 wheeled) Gait Pattern/deviations: Step-to pattern;Decreased stride length;Trunk flexed Gait velocity: decreased   General Gait Details: limited due to pain; max cueing for improved step length and motivation to complete task   Stairs             Wheelchair Mobility    Modified Rankin (Stroke Patients Only)       Balance Overall balance assessment: History of Falls                                          Cognition Arousal/Alertness: Lethargic;Suspect due to medications Behavior During Therapy: Anxious Overall Cognitive Status: Within Functional Limits for tasks assessed                                        Exercises      General Comments General comments (skin integrity, edema, etc.): daughter present throughout session; upon entering daughter attmepting to feed patient. education with pateint and daughter on need for increased mobility levels to progress to home      Pertinent Vitals/Pain Pain Assessment: 0-10 Pain Score: 10-Worst pain ever Pain Location: back Pain Descriptors / Indicators: Aching;Discomfort;Grimacing;Guarding;Moaning;Spasm Pain Intervention(s): Limited  activity within patient's tolerance;Monitored during session;Repositioned    Home Living                      Prior Function            PT Goals (current goals can now be found in the care plan section) Acute Rehab PT Goals Patient Stated Goal: to reduce pain PT Goal Formulation: With patient/family Time For Goal Achievement: 04/02/18 Potential to Achieve Goals: Good Progress towards PT goals: Progressing toward goals    Frequency     Min 3X/week      PT Plan      Co-evaluation PT/OT/SLP Co-Evaluation/Treatment: Yes Reason for Co-Treatment: Necessary to address cognition/behavior during functional activity;To address functional/ADL transfers PT goals addressed during session: Mobility/safety with mobility;Balance;Proper use of DME OT goals addressed during session: ADL's and self-care      AM-PAC PT "6 Clicks" Daily Activity  Outcome Measure  Difficulty turning over in bed (including adjusting bedclothes, sheets and blankets)?: A Lot Difficulty moving from lying on back to sitting on the side of the bed? : A Lot Difficulty sitting down on and standing up from a chair with arms (e.g., wheelchair, bedside commode, etc,.)?: Unable Help needed moving to and from a bed to chair (including a wheelchair)?: A Little Help needed walking in hospital room?: A Little Help needed climbing 3-5 steps with a railing? : A Lot 6 Click Score: 13    End of Session Equipment Utilized During Treatment: Gait belt Activity Tolerance: Patient limited by pain Patient left: in chair;with call bell/phone within reach;with family/visitor present Nurse Communication: Mobility status PT Visit Diagnosis: Other abnormalities of gait and mobility (R26.89);Pain;Difficulty in walking, not elsewhere classified (R26.2);History of falling (Z91.81) Pain - part of body: (back)     Time: 4765-4650 PT Time Calculation (min) (ACUTE ONLY): 42 min  Charges:  $Gait Training: 8-22 mins $Therapeutic Activity: 8-22 mins                     Lanney Gins, PT, DPT 03/20/18 12:40 PM Pager: [phone]

## 2018-03-20 NOTE — Progress Notes (Signed)
MD made aware about patient being unhappy with her pain management. Also, MD was informed that patient is refusing Seroquel and Depakote. Will continue to monitor.

## 2018-03-20 NOTE — Progress Notes (Signed)
[facility] PROGRESS NOTE  Linda Mercado SWH:[id number] DOB: Oct 29, 1965 DOA: 03/18/2018 PCP: Alfredo Martinez, PA  Assessment/Plan: 1. Intractable back pain after falls: little improvement during night. Awake all night.  Intrathecal pump evaluated per [employer] rep and is functioning properly. Pain management physician in [facility] never returned call/message x3. Reports IV med "stops after 2 hours". PT/OT eval recommending snf -continue intrathecal pump -PRN oxycodone and dilaudid and toradol -? If falls due to orthostasis---unable to obtain  orthostatic vital signs ordered -continue voltaren/lidoderm/robaxin  2. DM. Serum glucose 112 yesterday -holding metformin -obtain HgA1c.  -SSI for optimal control  3.  Bipolar disease. Appears stable at baseline. More compliant with therapies recommended - continue depakote and seroquel.  Code Status: full Family Communication: daughter at bedside (indicate person spoken with, relationship, and if by phone, the number) Disposition Plan: discharge hopefully tomorrow   Consultants:  none  Procedures:  none  Antibiotics:  none  HPI/Subjective: 51 year old woman with a history of diabetes, hypertension, as well as chronic back pain after a severe fall from a ladder several years ago, status post intrathecal pump in the right lower abdomen with fentanyl and clonidine scheduled infusions, as well as prn oxycodone presented on 03/18/2018 after falling due to "legs giving up", hitting her head.  No reported loss of consciousness.  On presentation, she reported that the pain was 10 out of 10, and has been more sedated than usual, as she has been needing more pain meds. In addition, she had Robaxin and her home dose of oxycodone, with mild control.  She also received IV Dilaudid and Toradol. CT of the head and neck and thoracic, cervical and lumbar spine were negative for fractures or acute abnormalities.              Objective: Vitals:   03/20/18 0855 03/20/18 1034  BP:  (!) 146/75  Pulse:    Resp:    Temp:    SpO2: 100% 100%    Intake/Output Summary (Last 24 hours) at 03/20/2018 1159 Last data filed at 03/20/2018 1000 Gross per 24 hour  Intake 320 ml  Output 75 ml  Net 245 ml   Filed Weights   03/19/18 0609  Weight: 60.8 kg    Exam:   General:  Awake alert in no acute distress  Cardiovascular: rrr no mgr no LE edema  Respiratory: somewhat shallow, bs distant but clear  Abdomen: soft non tender sluggish BS no guarding rebounding  Musculoskeletal: joints without swelling/erythema decreased rom to LE due to pain.   Data Reviewed: Basic Metabolic Panel: Recent Labs  Lab 03/18/18 2137 03/19/18 0527  NA 141 141  K 3.4* 4.2  CL 108 107  CO2 25 26  GLUCOSE 80 112*  BUN 10 11  CREATININE 0.76 0.89  CALCIUM 8.5* 9.0  MG 1.8 2.0  PHOS 3.6 5.2*   Liver Function Tests: Recent Labs  Lab 03/19/18 0527  AST 19  ALT 16  ALKPHOS 67  BILITOT 0.7  PROT 7.2  ALBUMIN 3.9   No results for input(s): LIPASE, AMYLASE in the last 168 hours. No results for input(s): AMMONIA in the last 168 hours. CBC: Recent Labs  Lab 03/18/18 2019 03/19/18 0527  WBC 6.7 7.8  HGB 12.7 12.4  HCT 40.4 40.4  MCV 89.0 90.0  PLT 260 277   Cardiac Enzymes: No results for input(s): CKTOTAL, CKMB, CKMBINDEX, TROPONINI in the last 168 hours. BNP (last 3 results) No results for input(s): BNP in the last  8760 hours.  ProBNP (last 3 results) No results for input(s): PROBNP in the last 8760 hours.  CBG: No results for input(s): GLUCAP in the last 168 hours.  No results found for this or any previous visit (from the past 240 hour(s)).   Studies: Dg Thoracic Spine 2 View  Result Date: 03/18/2018 CLINICAL DATA:  Multiple recent falls. Pain. For prior spine surgeries. EXAM: THORACIC SPINE 2 VIEWS; LUMBAR SPINE - COMPLETE 4+ VIEW COMPARISON:  Thoracic and lumbar spine radiograph December 24, 2017  FINDINGS: Thoracic spine: There is no evidence of thoracic spine fracture. Alignment is normal. No other significant bone abnormalities are identified. Punctate metallic foreign body projecting at T8 canal, unchanged. Lumbar spine: Five non rib-bearing lumbar-type vertebral bodies are intact. No malalignment. Maintained lumbar lordosis. Status post L5-S1 PLIF with arthrodesis. Intact hardware without periprosthetic lucency. Moderate L4-5 facet arthropathy. No destructive bony lesions. Sacroiliac joints are symmetric. Included prevertebral and paraspinal soft tissue planes are non-suspicious. Battery stimulator plaque projects over RIGHT hip. Coil material projecting in lower abdomen most compatible with herniorrhaphy. IMPRESSION: Thoracic spine: No fracture deformity, malalignment or dance degenerative change for age. Lumbar spine: No fracture deformity or malalignment. L5-S1 PLIF with arthrodesis. Electronically Signed   By: Elon Alas M.D.   On: 03/18/2018 17:50   Dg Lumbar Spine Complete  Result Date: 03/18/2018 CLINICAL DATA:  Multiple recent falls. Pain. For prior spine surgeries. EXAM: THORACIC SPINE 2 VIEWS; LUMBAR SPINE - COMPLETE 4+ VIEW COMPARISON:  Thoracic and lumbar spine radiograph December 24, 2017 FINDINGS: Thoracic spine: There is no evidence of thoracic spine fracture. Alignment is normal. No other significant bone abnormalities are identified. Punctate metallic foreign body projecting at T8 canal, unchanged. Lumbar spine: Five non rib-bearing lumbar-type vertebral bodies are intact. No malalignment. Maintained lumbar lordosis. Status post L5-S1 PLIF with arthrodesis. Intact hardware without periprosthetic lucency. Moderate L4-5 facet arthropathy. No destructive bony lesions. Sacroiliac joints are symmetric. Included prevertebral and paraspinal soft tissue planes are non-suspicious. Battery stimulator plaque projects over RIGHT hip. Coil material projecting in lower abdomen most compatible  with herniorrhaphy. IMPRESSION: Thoracic spine: No fracture deformity, malalignment or dance degenerative change for age. Lumbar spine: No fracture deformity or malalignment. L5-S1 PLIF with arthrodesis. Electronically Signed   By: Elon Alas M.D.   On: 03/18/2018 17:50   Dg Shoulder Right  Result Date: 03/18/2018 CLINICAL DATA:  Multiple recent falls, fell today, fell onto LEFT side, superior RIGHT shoulder pain EXAM: RIGHT SHOULDER - 2+ VIEW COMPARISON:  None FINDINGS: Osseous mineralization normal. AC joint alignment normal. No acute fracture, dislocation or bone destruction. Visualized ribs unremarkable. IMPRESSION: Normal exam. Electronically Signed   By: Lavonia Dana M.D.   On: 03/18/2018 17:53   Ct Head Wo Contrast  Result Date: 03/18/2018 CLINICAL DATA:  52 year old female with a history of fall EXAM: CT HEAD WITHOUT CONTRAST CT CERVICAL SPINE WITHOUT CONTRAST TECHNIQUE: Multidetector CT imaging of the head and cervical spine was performed following the standard protocol without intravenous contrast. Multiplanar CT image reconstructions of the cervical spine were also generated. COMPARISON:  None. FINDINGS: CT HEAD FINDINGS Brain: No acute intracranial hemorrhage. No midline shift or mass effect. Gray-white differentiation maintained. Unremarkable appearance of the ventricular system. Focal hypodensity in the right basal ganglia, either a perivascular space or region of focal demyelination. Vascular: Unremarkable. Skull: No acute fracture.  No aggressive bone lesion identified. Sinuses/Orbits: Unremarkable appearance of the orbits. Mastoid air cells clear. No middle ear effusion. No significant sinus disease. Other: None  CT CERVICAL SPINE FINDINGS Alignment: Apex right curvature, potentially positional. No subluxation. No anterolisthesis or retrolisthesis. Reversal the normal cervical lordosis. Skull base and vertebrae: No skull base fracture. Vertebral bodies demonstrate no acute fracture. No  facet fracture. Facets maintain alignment. Soft tissues and spinal canal: No canal hematoma. Unremarkable appearance of the soft tissues of the cervical region. Disc levels:  Disc space narrowing throughout the cervical spine. Posterior disc osteophyte complex at C3-C4 without significant canal narrowing. Posterior disc osteophyte complex at C5-C6 without significant canal narrowing. Posterior disc osteophyte complex at C6-C7, eccentric to the right with right-sided uncovertebral joint disease contributing to right foraminal narrowing. Upper chest: Unremarkable Other: None IMPRESSION: Head CT: No acute intracranial abnormality. Cervical CT: No acute fracture or malalignment of the cervical spine. Mild degenerative disc disease, with mild right foraminal narrowing at C6-C7 secondary to uncovertebral joint disease. Electronically Signed   By: Corrie Mckusick D.O.   On: 03/18/2018 16:28   Ct Cervical Spine Wo Contrast  Result Date: 03/18/2018 CLINICAL DATA:  52 year old female with a history of fall EXAM: CT HEAD WITHOUT CONTRAST CT CERVICAL SPINE WITHOUT CONTRAST TECHNIQUE: Multidetector CT imaging of the head and cervical spine was performed following the standard protocol without intravenous contrast. Multiplanar CT image reconstructions of the cervical spine were also generated. COMPARISON:  None. FINDINGS: CT HEAD FINDINGS Brain: No acute intracranial hemorrhage. No midline shift or mass effect. Gray-white differentiation maintained. Unremarkable appearance of the ventricular system. Focal hypodensity in the right basal ganglia, either a perivascular space or region of focal demyelination. Vascular: Unremarkable. Skull: No acute fracture.  No aggressive bone lesion identified. Sinuses/Orbits: Unremarkable appearance of the orbits. Mastoid air cells clear. No middle ear effusion. No significant sinus disease. Other: None CT CERVICAL SPINE FINDINGS Alignment: Apex right curvature, potentially positional. No  subluxation. No anterolisthesis or retrolisthesis. Reversal the normal cervical lordosis. Skull base and vertebrae: No skull base fracture. Vertebral bodies demonstrate no acute fracture. No facet fracture. Facets maintain alignment. Soft tissues and spinal canal: No canal hematoma. Unremarkable appearance of the soft tissues of the cervical region. Disc levels:  Disc space narrowing throughout the cervical spine. Posterior disc osteophyte complex at C3-C4 without significant canal narrowing. Posterior disc osteophyte complex at C5-C6 without significant canal narrowing. Posterior disc osteophyte complex at C6-C7, eccentric to the right with right-sided uncovertebral joint disease contributing to right foraminal narrowing. Upper chest: Unremarkable Other: None IMPRESSION: Head CT: No acute intracranial abnormality. Cervical CT: No acute fracture or malalignment of the cervical spine. Mild degenerative disc disease, with mild right foraminal narrowing at C6-C7 secondary to uncovertebral joint disease. Electronically Signed   By: Corrie Mckusick D.O.   On: 03/18/2018 16:28   Ct Thoracic Spine Wo Contrast  Result Date: 03/18/2018 CLINICAL DATA:  [place] from bed.  History of narcotic pump. EXAM: CT THORACIC AND LUMBAR SPINE WITHOUT CONTRAST TECHNIQUE: Multidetector CT imaging of the thoracic and lumbar spine was performed without contrast. Multiplanar CT image reconstructions were also generated. COMPARISON:  Thoracal lumbar spine radiographs March 18, 2018 FINDINGS: CT THORACIC SPINE FINDINGS ALIGNMENT: Maintained thoracic kyphosis. No malalignment. VERTEBRAE: Vertebral bodies and posterior elements are intact. Intervertebral disc heights preserved, mild ventral endplate spurring. No destructive bony lesions. PARASPINAL AND OTHER SOFT TISSUES: Nonacute. Heterogeneous thyroid without dominant nodule. DISC LEVELS: Contiguous epidural catheter with tip at posterior T9 ventral canal. No disc bulge, canal stenosis nor  neural foraminal narrowing. CT LUMBAR SPINE FINDINGS SEGMENTATION: For the purposes of this report the last well-formed  intervertebral disc space is reported as L5-S1. ALIGNMENT: Maintained lumbar lordosis. No malalignment. Mild broad dextroscoliosis on this nonweightbearing examination. VERTEBRAE: Vertebral bodies and posterior elements are intact. Status post L5-S1 PLIF with intact well-seated hardware. Incorporated posterior bone graft material. L5-S1 arthrosis. Nonsurgically altered disc heights preserved. No destructive bony lesions. PARASPINAL AND OTHER SOFT TISSUES: Contiguous epidural catheter via LEFT L2-3 interlaminar approach. Punctate RIGHT lower pole nephrolithiasis. At least small volume retained large bowel stool. DISC LEVELS: L1-2: No disc bulge, canal stenosis nor neural foraminal narrowing. L2-3: No disc bulge. Mild LEFT facet arthropathy without canal stenosis or neural foraminal narrowing. L3-4: Small broad-based disc bulge. Mild RIGHT and moderate LEFT facet arthropathy and ligamentum flavum redundancy without canal stenosis or neural foraminal narrowing. L4-5: Small broad-based disc bulge, small LEFT subarticular disc protrusion. Mild facet arthropathy without canal stenosis. Mild LEFT neural foraminal narrowing. L5-S1: PLIF. No canal stenosis. Severe osseous LEFT neural foraminal narrowing. IMPRESSION: CT THORACIC SPINE IMPRESSION 1. No fracture, malalignment or advanced degenerative change. 2. Epidural catheter tip at T9. CT LUMBAR SPINE IMPRESSION 1. No fracture or malalignment.  L5-S1 PLIF with arthrodesis. 2. Epidural catheter via LEFT L2-3 interlaminar approach. 3. No canal stenosis.  Severe LEFT L5-S1 neural foraminal narrowing. Electronically Signed   By: Elon Alas M.D.   On: 03/18/2018 20:10   Ct Lumbar Spine Wo Contrast  Result Date: 03/18/2018 CLINICAL DATA:  [place] from bed.  History of narcotic pump. EXAM: CT THORACIC AND LUMBAR SPINE WITHOUT CONTRAST TECHNIQUE:  Multidetector CT imaging of the thoracic and lumbar spine was performed without contrast. Multiplanar CT image reconstructions were also generated. COMPARISON:  Thoracal lumbar spine radiographs March 18, 2018 FINDINGS: CT THORACIC SPINE FINDINGS ALIGNMENT: Maintained thoracic kyphosis. No malalignment. VERTEBRAE: Vertebral bodies and posterior elements are intact. Intervertebral disc heights preserved, mild ventral endplate spurring. No destructive bony lesions. PARASPINAL AND OTHER SOFT TISSUES: Nonacute. Heterogeneous thyroid without dominant nodule. DISC LEVELS: Contiguous epidural catheter with tip at posterior T9 ventral canal. No disc bulge, canal stenosis nor neural foraminal narrowing. CT LUMBAR SPINE FINDINGS SEGMENTATION: For the purposes of this report the last well-formed intervertebral disc space is reported as L5-S1. ALIGNMENT: Maintained lumbar lordosis. No malalignment. Mild broad dextroscoliosis on this nonweightbearing examination. VERTEBRAE: Vertebral bodies and posterior elements are intact. Status post L5-S1 PLIF with intact well-seated hardware. Incorporated posterior bone graft material. L5-S1 arthrosis. Nonsurgically altered disc heights preserved. No destructive bony lesions. PARASPINAL AND OTHER SOFT TISSUES: Contiguous epidural catheter via LEFT L2-3 interlaminar approach. Punctate RIGHT lower pole nephrolithiasis. At least small volume retained large bowel stool. DISC LEVELS: L1-2: No disc bulge, canal stenosis nor neural foraminal narrowing. L2-3: No disc bulge. Mild LEFT facet arthropathy without canal stenosis or neural foraminal narrowing. L3-4: Small broad-based disc bulge. Mild RIGHT and moderate LEFT facet arthropathy and ligamentum flavum redundancy without canal stenosis or neural foraminal narrowing. L4-5: Small broad-based disc bulge, small LEFT subarticular disc protrusion. Mild facet arthropathy without canal stenosis. Mild LEFT neural foraminal narrowing. L5-S1: PLIF. No  canal stenosis. Severe osseous LEFT neural foraminal narrowing. IMPRESSION: CT THORACIC SPINE IMPRESSION 1. No fracture, malalignment or advanced degenerative change. 2. Epidural catheter tip at T9. CT LUMBAR SPINE IMPRESSION 1. No fracture or malalignment.  L5-S1 PLIF with arthrodesis. 2. Epidural catheter via LEFT L2-3 interlaminar approach. 3. No canal stenosis.  Severe LEFT L5-S1 neural foraminal narrowing. Electronically Signed   By: Elon Alas M.D.   On: 03/18/2018 20:10    Scheduled Meds: . acetaminophen  1,000  mg Oral [id number]   Or  . acetaminophen  650 mg Rectal [id number]  . atenolol  100 mg Oral Daily  . atorvastatin  20 mg Oral QHS  . budesonide  0.25 mg Inhalation BID  . diclofenac sodium  4 g Topical QID  . divalproex  1,000 mg Oral Daily  . losartan  25 mg Oral Daily  . polyethylene glycol  17 g Oral Daily  . QUEtiapine  400 mg Oral BID   Continuous Infusions:  Active Problems:   Intractable back pain   Type 2 diabetes mellitus without complication (HCC)   Presence of intrathecal pump   Hypokalemia   Bradycardia   HTN (hypertension)   Chronic pain syndrome   Intractable pain    Time spent: 60 minutes    [facility] If 7PM-7AM, please contact night-coverage at [email or web address], password [email or web address] 03/20/2018, 11:59 AM  LOS: 0 days

## 2018-03-21 LAB — GLUCOSE, CAPILLARY
Glucose-Capillary: 117 mg/dL — ABNORMAL HIGH (ref 70–99)
Glucose-Capillary: 110 mg/dL — ABNORMAL HIGH (ref 70–99)

## 2018-03-21 MED ORDER — POLYETHYLENE GLYCOL 3350 17 G PO PACK: 17.0000 g | PACK | Freq: Every day | ORAL | 3 refills | Status: DC

## 2018-03-21 MED ORDER — METFORMIN HCL 500 MG PO TABS: 500.0000 mg | ORAL_TABLET | Freq: Every day | ORAL | 1 refills | Status: DC

## 2018-03-21 MED ORDER — TRAZODONE HCL 100 MG PO TABS: 100.0000 mg | ORAL_TABLET | Freq: Every day | ORAL | 3 refills | Status: DC

## 2018-03-21 MED ORDER — ACETAMINOPHEN 500 MG PO TABS: 1000.0000 mg | ORAL_TABLET | ORAL | 0 refills | Status: DC

## 2018-03-21 MED ORDER — OXYCODONE HCL 5 MG PO TABS
10.0000 mg | ORAL_TABLET | Freq: Once | ORAL | Status: AC
  Administered 2018-03-21: 10 mg via ORAL

## 2018-03-21 MED ORDER — SENNOSIDES-DOCUSATE SODIUM 8.6-50 MG PO TABS: 2.0000 | ORAL_TABLET | Freq: Every day | ORAL | 1 refills | Status: DC

## 2018-03-21 MED ORDER — ONDANSETRON HCL 4 MG PO TABS: 4.0000 mg | ORAL_TABLET | Freq: Four times a day (QID) | ORAL | 0 refills | Status: DC | PRN

## 2018-03-21 NOTE — Care Management Note (Signed)
Case Management Note  Patient Details  Name: Linda Mercado MRN: [id number] Date of Birth: April 16, 1966  Subjective/Objective:                    Action/Plan:  Spoke w patient and daughter. They decline HH services at this time. Rollator and tub bench will be delivered to room prior to DC. No other CM needs.   Expected Discharge Date:  03/21/18               Expected Discharge Plan:  Home/Self Care  In-House Referral:  Clinical Social Work  Discharge planning Services  CM Consult  Post Acute Care Choice:  Durable Medical Equipment Choice offered to:  Patient, Adult Children  DME Arranged:  Tub bench, Walker rolling with seat DME Agency:  [place]:  Patient Refused [place] Agency:     Status of Service:  Completed, signed off  If discussed at [employer] of Stay Meetings, dates discussed:    Additional Comments:  Carles Collet, RN 03/21/2018, 12:33 PM

## 2018-03-21 NOTE — Discharge Summary (Signed)
Linda Mercado, is a 52 y.o. female  DOB [birth year]  MRN [id number].  Admission date:  03/18/2018  Admitting Physician  Toy Baker, MD  Discharge Date:  03/21/2018   Primary MD  Lowder, Clovia Cuff, PA  Recommendations for primary care physician for things to follow:   1)Chronic back pain--- mobility related difficulties due to pain----continue your pain medications as prescribed by your pain specialist from [facility],   2) you will receive home health physical therapy to help you with improve mobility 3) please follow-up with your pain specialist on 03/23/2018 to discuss further adjustments to your pain regimen 4) your blood sugars are borderline low, decrease your metformin to 500 mg once a day instead of twice a day to avoid low blood sugars  Please note  You were cared for by a hospitalist Physician during your hospital stay. Once you are discharged, your primary care physician will handle any further medical issues. Please note that NO REFILLS for any discharge medications will be authorized once you are discharged, as it is imperative that you return to your primary care physician (or establish a relationship with a primary care physician if you do not have one) for your aftercare needs so that they can reassess your need for medications and monitor your lab values   Admission Diagnosis  Intractable back pain [M54.9]   Discharge Diagnosis  Intractable back pain [M54.9]   Active Problems:   Intractable back pain   HTN (hypertension)   Type 2 diabetes mellitus without complication (HCC)   Chronic pain syndrome   Presence of intrathecal pump   Hypokalemia   Intractable pain   Bradycardia      Past Medical History:  Diagnosis Date  . Bradycardia   . Chronic back pain   . Diabetes mellitus without complication ([place])   . Hypertension   . Hypokalemia     History reviewed. No pertinent  surgical history.     HPI  from the history and physical done on the day of admission:    Chief Complaint: fall HPI: Linda Mercado is a 52 y.o. female with medical history significant of DM 2, HTN, chronic back pain sp intrathecal pump  Chronic pelvic pain  Presented with fall that occurred today patient sit up on the side of her bed to go to bathroom try to stand up and fell down because her legs gave out hit her head on the nightstand not sure if she passed out or not.  Patient has chronic intrathecal pump with fentanyl and clonidine scheduled infusions as well as will PRN oxycodone 2 weeks ago she had similar fall also because of her legs giving down she presented to emergency department complaining of head neck and back pain 10 out of 10 severity she is skipped her oxycodone dose in afternoon She has been a bit sedated lately Reports she supposed to use a walker but forgot.   Regarding pertinent Chronic problems: history of hypertension on atenolol  and losartan Regarding chronic pain -  While in  ER:  Patient at first to receive her home dose of oxycodone and Robaxin CT head/neck thoracic and lumbar plain films negative for fractures or acute abnormalities Continue to have pain at which point she was given IV Dilaudid and Toradol. She continued to be uncomfortable pain seems to be not controlled by   IV Dilaudid.     Hospital Course:     1)Intractable back pain after falls: --Patient has chronic pain, and intrathecal pump is usually changed from Dilaudid to fentanyl and vice versa every other month,.  Intrathecal pump evaluated per [employer] rep and is functioning properly.  Follow-up with pain management physician in [facility] -continue intrathecal pump, patient already has PRN oxycodone at home  2) DM2--A1c 5.7, patient is at risk for hypoglycemia, decrease metformin to 500 mg daily from 500 mg twice daily.   3)Bipolar Disease-  Appears stable at baseline,  NOT   compliant with therapies recommended, patient repeatedly refused depakote and seroquel.  Code Status: full Family Communication: daughter at bedside  Disposition Plan:  Home with home health services, declined skilled nursing facility because most skilled nursing facility and is uncomfortable taking patient with intrathecal pain pump  Discharge Condition: stable  Follow UP--pain specialist   Consults obtained -physical therapy  Diet and Activity recommendation:  As advised  Discharge Instructions    Discharge Instructions    Call MD for:  difficulty breathing, headache or visual disturbances   Complete by:  As directed    Call MD for:  persistant dizziness or light-headedness   Complete by:  As directed    Call MD for:  persistant nausea and vomiting   Complete by:  As directed    Call MD for:  severe uncontrolled pain   Complete by:  As directed    Call MD for:  temperature >100.4   Complete by:  As directed    Diet Carb Modified   Complete by:  As directed    Discharge instructions   Complete by:  As directed    1)Chronic back pain--- mobility related difficulties due to pain----continue your pain medications as prescribed by your pain specialist from [facility],   2) you will receive home health physical therapy to help you with improve mobility 3) please follow-up with your pain specialist on 03/23/2018 to discuss further adjustments to your pain regimen 4) your blood sugars are borderline low, decrease your metformin to 500 mg once a day instead of twice a day to avoid low blood sugars  Please note  You were cared for by a hospitalist Physician during your hospital stay. Once you are discharged, your primary care physician will handle any further medical issues. Please note that NO REFILLS for any discharge medications will be authorized once you are discharged, as it is imperative that you return to your primary care physician (or establish a relationship with a primary  care physician if you do not have one) for your aftercare needs so that they can reassess your need for medications and monitor your lab values   Increase activity slowly   Complete by:  As directed    Limitations as advised by your physical therapist       Discharge Medications     Allergies as of 03/21/2018      Reactions   Lisinopril Hives   Other reaction(s): anaphylaxis/angioedema   Latex Hives   Adhesive [tape] Rash   Sulfamethoxazole-trimethoprim Diarrhea   Other reaction(s): gi distress      Medication List  TAKE these medications   acetaminophen 500 MG tablet Commonly known as:  TYLENOL Take 2 tablets (1,000 mg total) by mouth 3 (three) times daily at 8am, 2pm and bedtime.   albuterol 108 (90 Base) MCG/ACT inhaler Commonly known as:  PROVENTIL HFA;VENTOLIN HFA Inhale 2 puffs into the lungs every 6 (six) hours as needed for wheezing or shortness of breath.   atenolol 100 MG tablet Commonly known as:  TENORMIN Take 100 mg by mouth daily.   atorvastatin 20 MG tablet Commonly known as:  LIPITOR Take 20 mg by mouth at bedtime.   clonazePAM 1 MG tablet Commonly known as:  KLONOPIN Take 1 mg by mouth 3 (three) times daily.   diclofenac sodium 1 % Gel Commonly known as:  VOLTAREN Apply 4 g topically 4 (four) times daily as needed for pain.   divalproex 500 MG 24 hr tablet Commonly known as:  DEPAKOTE ER Take 1,000 mg by mouth daily.   docusate sodium 50 MG capsule Commonly known as:  COLACE Take 50 mg by mouth at bedtime.   etodolac 400 MG tablet Commonly known as:  LODINE Take 400 mg by mouth 2 (two) times daily.   fluticasone 110 MCG/ACT inhaler Commonly known as:  FLOVENT HFA Inhale 2 puffs into the lungs every 12 (twelve) hours.   hydrochlorothiazide 12.5 MG capsule Commonly known as:  MICROZIDE Take 12.5 mg by mouth daily.   hydrOXYzine 25 MG tablet Commonly known as:  ATARAX/VISTARIL Take 25 mg by mouth 2 (two) times daily.   lidocaine 5  % Commonly known as:  LIDODERM Apply 1 patch topically daily as needed (for back pain).   losartan 25 MG tablet Commonly known as:  COZAAR Take 25 mg by mouth daily.   metFORMIN 500 MG tablet Commonly known as:  GLUCOPHAGE Take 1 tablet (500 mg total) by mouth daily with breakfast. What changed:  when to take this   NARCAN 4 MG/0.1ML Liqd nasal spray kit Generic drug:  naloxone Place 1 spray into the nose as directed.   ondansetron 4 MG tablet Commonly known as:  ZOFRAN Take 4 mg by mouth every 6 (six) hours as needed for nausea or vomiting. What changed:  Another medication with the same name was added. Make sure you understand how and when to take each.   ondansetron 4 MG tablet Commonly known as:  ZOFRAN Take 1 tablet (4 mg total) by mouth every 6 (six) hours as needed for nausea. What changed:  You were already taking a medication with the same name, and this prescription was added. Make sure you understand how and when to take each.   oxyCODONE 5 MG immediate release tablet Commonly known as:  Oxy IR/ROXICODONE Take 5 mg by mouth 4 (four) times daily.   polyethylene glycol packet Commonly known as:  MIRALAX / GLYCOLAX Take 17 g by mouth daily. For Bowels Start taking on:  03/22/2018   PRESCRIPTION MEDICATION Pain pump (Fentanyl 100 mcg/ml) and Clonidine (150 mcg/ml): Continuous and patient may bolus and extra 2 mcg four times a day   promethazine 25 MG tablet Commonly known as:  PHENERGAN Take 25 mg by mouth every 6 (six) hours as needed for nausea or vomiting.   QUEtiapine 400 MG 24 hr tablet Commonly known as:  SEROQUEL XR Take 400 mg by mouth 2 (two) times daily.   senna-docusate 8.6-50 MG tablet Commonly known as:  Senokot-S Take 2 tablets by mouth at bedtime.   tiZANidine 4 MG tablet Commonly known  as:  ZANAFLEX Take 4 mg by mouth every 8 (eight) hours as needed (for nerve pain).   traZODone 100 MG tablet Commonly known as:  DESYREL Take 1 tablet (100  mg total) by mouth at bedtime.   TRI-ESTARYLLA 0.18/0.215/0.25 MG-35 MCG tablet Generic drug:  Norgestimate-Ethinyl Estradiol Triphasic Take 1 tablet by mouth daily.   triamcinolone cream 0.1 % Commonly known as:  KENALOG Apply 1 application topically daily as needed (for itching).   TRINTELLIX 10 MG Tabs tablet Generic drug:  vortioxetine HBr Take 10 mg by mouth at bedtime.            Durable Medical Equipment  (From admission, onward)         Start     Ordered   03/21/18 1226  For home use only DME 4 wheeled rolling walker with seat  Once    Question:  Patient needs a walker to treat with the following condition  Answer:  Weakness   03/21/18 1225   03/21/18 1226  For home use only DME Tub bench  Once     03/21/18 1225          Major procedures and Radiology Reports - PLEASE review detailed and final reports for all details, in brief -      Dg Thoracic Spine 2 View  Result Date: 03/18/2018 CLINICAL DATA:  Multiple recent falls. Pain. For prior spine surgeries. EXAM: THORACIC SPINE 2 VIEWS; LUMBAR SPINE - COMPLETE 4+ VIEW COMPARISON:  Thoracic and lumbar spine radiograph December 24, 2017 FINDINGS: Thoracic spine: There is no evidence of thoracic spine fracture. Alignment is normal. No other significant bone abnormalities are identified. Punctate metallic foreign body projecting at T8 canal, unchanged. Lumbar spine: Five non rib-bearing lumbar-type vertebral bodies are intact. No malalignment. Maintained lumbar lordosis. Status post L5-S1 PLIF with arthrodesis. Intact hardware without periprosthetic lucency. Moderate L4-5 facet arthropathy. No destructive bony lesions. Sacroiliac joints are symmetric. Included prevertebral and paraspinal soft tissue planes are non-suspicious. Battery stimulator plaque projects over RIGHT hip. Coil material projecting in lower abdomen most compatible with herniorrhaphy. IMPRESSION: Thoracic spine: No fracture deformity, malalignment or dance  degenerative change for age. Lumbar spine: No fracture deformity or malalignment. L5-S1 PLIF with arthrodesis. Electronically Signed   By: Elon Alas M.D.   On: 03/18/2018 17:50   Dg Lumbar Spine Complete  Result Date: 03/18/2018 CLINICAL DATA:  Multiple recent falls. Pain. For prior spine surgeries. EXAM: THORACIC SPINE 2 VIEWS; LUMBAR SPINE - COMPLETE 4+ VIEW COMPARISON:  Thoracic and lumbar spine radiograph December 24, 2017 FINDINGS: Thoracic spine: There is no evidence of thoracic spine fracture. Alignment is normal. No other significant bone abnormalities are identified. Punctate metallic foreign body projecting at T8 canal, unchanged. Lumbar spine: Five non rib-bearing lumbar-type vertebral bodies are intact. No malalignment. Maintained lumbar lordosis. Status post L5-S1 PLIF with arthrodesis. Intact hardware without periprosthetic lucency. Moderate L4-5 facet arthropathy. No destructive bony lesions. Sacroiliac joints are symmetric. Included prevertebral and paraspinal soft tissue planes are non-suspicious. Battery stimulator plaque projects over RIGHT hip. Coil material projecting in lower abdomen most compatible with herniorrhaphy. IMPRESSION: Thoracic spine: No fracture deformity, malalignment or dance degenerative change for age. Lumbar spine: No fracture deformity or malalignment. L5-S1 PLIF with arthrodesis. Electronically Signed   By: Elon Alas M.D.   On: 03/18/2018 17:50   Dg Shoulder Right  Result Date: 03/18/2018 CLINICAL DATA:  Multiple recent falls, fell today, fell onto LEFT side, superior RIGHT shoulder pain EXAM: RIGHT SHOULDER - 2+  VIEW COMPARISON:  None FINDINGS: Osseous mineralization normal. AC joint alignment normal. No acute fracture, dislocation or bone destruction. Visualized ribs unremarkable. IMPRESSION: Normal exam. Electronically Signed   By: Lavonia Dana M.D.   On: 03/18/2018 17:53   Ct Head Wo Contrast  Result Date: 03/18/2018 CLINICAL DATA:  52 year old  female with a history of fall EXAM: CT HEAD WITHOUT CONTRAST CT CERVICAL SPINE WITHOUT CONTRAST TECHNIQUE: Multidetector CT imaging of the head and cervical spine was performed following the standard protocol without intravenous contrast. Multiplanar CT image reconstructions of the cervical spine were also generated. COMPARISON:  None. FINDINGS: CT HEAD FINDINGS Brain: No acute intracranial hemorrhage. No midline shift or mass effect. Gray-white differentiation maintained. Unremarkable appearance of the ventricular system. Focal hypodensity in the right basal ganglia, either a perivascular space or region of focal demyelination. Vascular: Unremarkable. Skull: No acute fracture.  No aggressive bone lesion identified. Sinuses/Orbits: Unremarkable appearance of the orbits. Mastoid air cells clear. No middle ear effusion. No significant sinus disease. Other: None CT CERVICAL SPINE FINDINGS Alignment: Apex right curvature, potentially positional. No subluxation. No anterolisthesis or retrolisthesis. Reversal the normal cervical lordosis. Skull base and vertebrae: No skull base fracture. Vertebral bodies demonstrate no acute fracture. No facet fracture. Facets maintain alignment. Soft tissues and spinal canal: No canal hematoma. Unremarkable appearance of the soft tissues of the cervical region. Disc levels:  Disc space narrowing throughout the cervical spine. Posterior disc osteophyte complex at C3-C4 without significant canal narrowing. Posterior disc osteophyte complex at C5-C6 without significant canal narrowing. Posterior disc osteophyte complex at C6-C7, eccentric to the right with right-sided uncovertebral joint disease contributing to right foraminal narrowing. Upper chest: Unremarkable Other: None IMPRESSION: Head CT: No acute intracranial abnormality. Cervical CT: No acute fracture or malalignment of the cervical spine. Mild degenerative disc disease, with mild right foraminal narrowing at C6-C7 secondary to  uncovertebral joint disease. Electronically Signed   By: Corrie Mckusick D.O.   On: 03/18/2018 16:28   Ct Cervical Spine Wo Contrast  Result Date: 03/18/2018 CLINICAL DATA:  52 year old female with a history of fall EXAM: CT HEAD WITHOUT CONTRAST CT CERVICAL SPINE WITHOUT CONTRAST TECHNIQUE: Multidetector CT imaging of the head and cervical spine was performed following the standard protocol without intravenous contrast. Multiplanar CT image reconstructions of the cervical spine were also generated. COMPARISON:  None. FINDINGS: CT HEAD FINDINGS Brain: No acute intracranial hemorrhage. No midline shift or mass effect. Gray-white differentiation maintained. Unremarkable appearance of the ventricular system. Focal hypodensity in the right basal ganglia, either a perivascular space or region of focal demyelination. Vascular: Unremarkable. Skull: No acute fracture.  No aggressive bone lesion identified. Sinuses/Orbits: Unremarkable appearance of the orbits. Mastoid air cells clear. No middle ear effusion. No significant sinus disease. Other: None CT CERVICAL SPINE FINDINGS Alignment: Apex right curvature, potentially positional. No subluxation. No anterolisthesis or retrolisthesis. Reversal the normal cervical lordosis. Skull base and vertebrae: No skull base fracture. Vertebral bodies demonstrate no acute fracture. No facet fracture. Facets maintain alignment. Soft tissues and spinal canal: No canal hematoma. Unremarkable appearance of the soft tissues of the cervical region. Disc levels:  Disc space narrowing throughout the cervical spine. Posterior disc osteophyte complex at C3-C4 without significant canal narrowing. Posterior disc osteophyte complex at C5-C6 without significant canal narrowing. Posterior disc osteophyte complex at C6-C7, eccentric to the right with right-sided uncovertebral joint disease contributing to right foraminal narrowing. Upper chest: Unremarkable Other: None IMPRESSION: Head CT: No acute  intracranial abnormality. Cervical CT: No acute  fracture or malalignment of the cervical spine. Mild degenerative disc disease, with mild right foraminal narrowing at C6-C7 secondary to uncovertebral joint disease. Electronically Signed   By: Corrie Mckusick D.O.   On: 03/18/2018 16:28   Ct Thoracic Spine Wo Contrast  Result Date: 03/18/2018 CLINICAL DATA:  [place] from bed.  History of narcotic pump. EXAM: CT THORACIC AND LUMBAR SPINE WITHOUT CONTRAST TECHNIQUE: Multidetector CT imaging of the thoracic and lumbar spine was performed without contrast. Multiplanar CT image reconstructions were also generated. COMPARISON:  Thoracal lumbar spine radiographs March 18, 2018 FINDINGS: CT THORACIC SPINE FINDINGS ALIGNMENT: Maintained thoracic kyphosis. No malalignment. VERTEBRAE: Vertebral bodies and posterior elements are intact. Intervertebral disc heights preserved, mild ventral endplate spurring. No destructive bony lesions. PARASPINAL AND OTHER SOFT TISSUES: Nonacute. Heterogeneous thyroid without dominant nodule. DISC LEVELS: Contiguous epidural catheter with tip at posterior T9 ventral canal. No disc bulge, canal stenosis nor neural foraminal narrowing. CT LUMBAR SPINE FINDINGS SEGMENTATION: For the purposes of this report the last well-formed intervertebral disc space is reported as L5-S1. ALIGNMENT: Maintained lumbar lordosis. No malalignment. Mild broad dextroscoliosis on this nonweightbearing examination. VERTEBRAE: Vertebral bodies and posterior elements are intact. Status post L5-S1 PLIF with intact well-seated hardware. Incorporated posterior bone graft material. L5-S1 arthrosis. Nonsurgically altered disc heights preserved. No destructive bony lesions. PARASPINAL AND OTHER SOFT TISSUES: Contiguous epidural catheter via LEFT L2-3 interlaminar approach. Punctate RIGHT lower pole nephrolithiasis. At least small volume retained large bowel stool. DISC LEVELS: L1-2: No disc bulge, canal stenosis nor neural  foraminal narrowing. L2-3: No disc bulge. Mild LEFT facet arthropathy without canal stenosis or neural foraminal narrowing. L3-4: Small broad-based disc bulge. Mild RIGHT and moderate LEFT facet arthropathy and ligamentum flavum redundancy without canal stenosis or neural foraminal narrowing. L4-5: Small broad-based disc bulge, small LEFT subarticular disc protrusion. Mild facet arthropathy without canal stenosis. Mild LEFT neural foraminal narrowing. L5-S1: PLIF. No canal stenosis. Severe osseous LEFT neural foraminal narrowing. IMPRESSION: CT THORACIC SPINE IMPRESSION 1. No fracture, malalignment or advanced degenerative change. 2. Epidural catheter tip at T9. CT LUMBAR SPINE IMPRESSION 1. No fracture or malalignment.  L5-S1 PLIF with arthrodesis. 2. Epidural catheter via LEFT L2-3 interlaminar approach. 3. No canal stenosis.  Severe LEFT L5-S1 neural foraminal narrowing. Electronically Signed   By: Elon Alas M.D.   On: 03/18/2018 20:10   Ct Lumbar Spine Wo Contrast  Result Date: 03/18/2018 CLINICAL DATA:  [place] from bed.  History of narcotic pump. EXAM: CT THORACIC AND LUMBAR SPINE WITHOUT CONTRAST TECHNIQUE: Multidetector CT imaging of the thoracic and lumbar spine was performed without contrast. Multiplanar CT image reconstructions were also generated. COMPARISON:  Thoracal lumbar spine radiographs March 18, 2018 FINDINGS: CT THORACIC SPINE FINDINGS ALIGNMENT: Maintained thoracic kyphosis. No malalignment. VERTEBRAE: Vertebral bodies and posterior elements are intact. Intervertebral disc heights preserved, mild ventral endplate spurring. No destructive bony lesions. PARASPINAL AND OTHER SOFT TISSUES: Nonacute. Heterogeneous thyroid without dominant nodule. DISC LEVELS: Contiguous epidural catheter with tip at posterior T9 ventral canal. No disc bulge, canal stenosis nor neural foraminal narrowing. CT LUMBAR SPINE FINDINGS SEGMENTATION: For the purposes of this report the last well-formed  intervertebral disc space is reported as L5-S1. ALIGNMENT: Maintained lumbar lordosis. No malalignment. Mild broad dextroscoliosis on this nonweightbearing examination. VERTEBRAE: Vertebral bodies and posterior elements are intact. Status post L5-S1 PLIF with intact well-seated hardware. Incorporated posterior bone graft material. L5-S1 arthrosis. Nonsurgically altered disc heights preserved. No destructive bony lesions. PARASPINAL AND OTHER SOFT TISSUES: Contiguous epidural catheter via  LEFT L2-3 interlaminar approach. Punctate RIGHT lower pole nephrolithiasis. At least small volume retained large bowel stool. DISC LEVELS: L1-2: No disc bulge, canal stenosis nor neural foraminal narrowing. L2-3: No disc bulge. Mild LEFT facet arthropathy without canal stenosis or neural foraminal narrowing. L3-4: Small broad-based disc bulge. Mild RIGHT and moderate LEFT facet arthropathy and ligamentum flavum redundancy without canal stenosis or neural foraminal narrowing. L4-5: Small broad-based disc bulge, small LEFT subarticular disc protrusion. Mild facet arthropathy without canal stenosis. Mild LEFT neural foraminal narrowing. L5-S1: PLIF. No canal stenosis. Severe osseous LEFT neural foraminal narrowing. IMPRESSION: CT THORACIC SPINE IMPRESSION 1. No fracture, malalignment or advanced degenerative change. 2. Epidural catheter tip at T9. CT LUMBAR SPINE IMPRESSION 1. No fracture or malalignment.  L5-S1 PLIF with arthrodesis. 2. Epidural catheter via LEFT L2-3 interlaminar approach. 3. No canal stenosis.  Severe LEFT L5-S1 neural foraminal narrowing. Electronically Signed   By: Elon Alas M.D.   On: 03/18/2018 20:10    Micro Results     No results found for this or any previous visit (from the past 240 hour(s)).     Today   Subjective    Beverlie Kurihara today has no new concerns, chronic pain issues persist, no fevers no bowel or bladder concerns          Patient has been seen and examined prior to  discharge   Objective   Blood pressure (!) 122/95, pulse (!) 55, temperature 98.2 F (36.8 C), temperature source Oral, resp. rate 16, height 5' 2" (1.575 m), weight 60.8 kg, last menstrual period 03/11/2018, SpO2 100 %.   Intake/Output Summary (Last 24 hours) at 03/21/2018 1446 Last data filed at 03/20/2018 1829 Gross per 24 hour  Intake 120 ml  Output 550 ml  Net -430 ml    Exam Gen:- Awake Alert, resting in bed daughter at bedside HEENT:- [place].AT, No sclera icterus Neck-Supple Neck,No JVD,.  Lungs-  CTAB , good air movement CV- S1, S2 normal Abd-  +ve B.Sounds, Abd Soft, No tenderness,    Extremity/Skin:- No  edema,   good pulses Psych-affect is appropriate, oriented x3 Neuro-no new focal deficits, no tremors MSK-lumbar area discomfort, pain with lower extremity range of movement, overlying skin without erythema, no obvious deformity  Data Review   CBC w Diff:  Lab Results  Component Value Date   WBC 7.8 03/19/2018   HGB 12.4 03/19/2018   HCT 40.4 03/19/2018   PLT 277 03/19/2018   LYMPHOPCT 44 01/03/2018   MONOPCT 5 01/03/2018   EOSPCT 2 01/03/2018   BASOPCT 1 01/03/2018    CMP:  Lab Results  Component Value Date   NA 141 03/19/2018   K 4.2 03/19/2018   CL 107 03/19/2018   CO2 26 03/19/2018   BUN 11 03/19/2018   CREATININE 0.89 03/19/2018   PROT 7.2 03/19/2018   ALBUMIN 3.9 03/19/2018   BILITOT 0.7 03/19/2018   ALKPHOS 67 03/19/2018   AST 19 03/19/2018   ALT 16 03/19/2018  .   Total Discharge time is about 33 minutes  Roxan Hockey M.D on 03/21/2018 at 2:46 PM  Pager---[phone]  Go to [email or web address] - password [email or web address] for contact info  [facility] - Office  [phone]

## 2018-03-21 NOTE — Progress Notes (Signed)
Occupational Therapy Treatment Patient Details Name: Linda Mercado MRN: [id number] DOB: 09-Jun-1966 Today's Date: 03/21/2018    History of present illness Pt is a 52 y.o. F with significant PMH of diabetes, hypertension, as well as chronic back pain after a severe fall from a ladder several years ago s/p intrathecal pump in right lower abdomen with fentanyl and clonidine scheduled infusions. Presented on 8/28 after falling due to "legs giving up," hitting her head. CT of the head and neck and thoracic, cervical and lumbar spine were negative for fractures.     OT comments  Pt continues to be limited by pain, but making progress towards OT goals this session. Session focused on tub bench education, AE education, and caregiver education for safety with transfers. Gait belt provided with education. Pt not able to go to SNF, and Pt currently declining HHOT - although the best (and safest) to maximize independence in ADL and functional transfers would be HHOT. OT will continue to follow acutely (althogh set to dc today). Pt and daughter with no questions at the end of session.   Follow Up Recommendations  Home health OT;SNF    Equipment Recommendations  Tub/shower bench;Other (comment)(RW)    Recommendations for Other Services      Precautions / Restrictions Precautions Precautions: Fall Precaution Comments: back precautions for comfort, intrathecal pump Restrictions Weight Bearing Restrictions: No       Mobility Bed Mobility Overal bed mobility: Needs Assistance Bed Mobility: Rolling;Sidelying to Sit;Sit to Sidelying Rolling: Supervision Sidelying to sit: Min assist(trunk elevation)     Sit to sidelying: Min assist(BLE back into bed) General bed mobility comments: increased time and effort - max motivational cueing  Transfers Overall transfer level: Needs assistance Equipment used: Rolling walker (2 wheeled) Transfers: Sit to/from Stand Sit to Stand: Min guard          General transfer comment: continues to require multimodal cues for safety and sequencing    Balance Overall balance assessment: History of Falls                                         ADL either performed or assessed with clinical judgement   ADL Overall ADL's : Needs assistance/impaired                         Toilet Transfer: Minimal assistance;BSC;Ambulation;RW   Toileting- Clothing Manipulation and Hygiene: Sit to/from stand;Sitting/lateral lean;Set up;Supervision/safety   Tub/ Shower Transfer: Tub transfer;Minimal assistance;With caregiver independent assisting;Tub bench;Rolling walker Tub/Shower Transfer Details (indicate cue type and reason): verbally educated and then simulated tub transfer in room, daughter acting as facilitator on return to bed Functional mobility during ADLs: Minimal assistance;Min guard;Rolling walker General ADL Comments: provided AE demo and practice for long handle shoe horn, sock donner, long handle sponge, reacher/grabber - Pt interested in long handle sponge and reacher     Vision       Perception     Praxis      Cognition Arousal/Alertness: Awake/alert Behavior During Therapy: Flat affect Overall Cognitive Status: Within Functional Limits for tasks assessed                                          Exercises     Shoulder Instructions  General Comments daughter present throughout session    Pertinent Vitals/ Pain       Pain Assessment: 0-10 Pain Score: 10-Worst pain ever Pain Location: back Pain Descriptors / Indicators: Aching;Discomfort;Grimacing;Guarding;Moaning;Spasm Pain Intervention(s): Monitored during session;Limited activity within patient's tolerance  Home Living Family/patient expects to be discharged to:: Private residence Living Arrangements: Children Available Help at Discharge: Family;Available PRN/intermittently                                     Prior Functioning/Environment              Frequency  Min 3X/week        Progress Toward Goals  OT Goals(current goals can now be found in the care plan section)  Progress towards OT goals: Progressing toward goals  Acute Rehab OT Goals Patient Stated Goal: to reduce pain OT Goal Formulation: With patient Time For Goal Achievement: 04/02/18 Potential to Achieve Goals: Good  Plan Discharge plan needs to be updated    Co-evaluation                 AM-PAC PT "6 Clicks" Daily Activity     Outcome Measure   Help from another person eating meals?: None Help from another person taking care of personal grooming?: A Little Help from another person toileting, which includes using toliet, bedpan, or urinal?: A Little Help from another person bathing (including washing, rinsing, drying)?: A Lot Help from another person to put on and taking off regular upper body clothing?: A Little Help from another person to put on and taking off regular lower body clothing?: A Lot 6 Click Score: 17    End of Session Equipment Utilized During Treatment: Gait belt;Rolling walker  OT Visit Diagnosis: Other abnormalities of gait and mobility (R26.89);Muscle weakness (generalized) (M62.81);Repeated falls (R29.6);Pain Pain - part of body: Leg;Hip;Knee(back)   Activity Tolerance Patient limited by pain   Patient Left in bed;with call bell/phone within reach;with bed alarm set   Nurse Communication Mobility status        Time: 4801-6553 OT Time Calculation (min): 28 min  Charges: OT General Charges $OT Visit: 1 Visit OT Treatments $Self Care/Home Management : 23-37 mins  Hulda Humphrey OTR/L Acute Rehabilitation Services Pager: [phone] Office: [place] 03/21/2018, 2:11 PM

## 2018-03-21 NOTE — Progress Notes (Signed)
Spoke w patient's daughter who states they have changed their mind and want HH. They would like to use [facility]. Referral made to [facility] for PT OT as ordered. No other CM needs.

## 2018-03-21 NOTE — Plan of Care (Signed)
  Problem: Pain Managment: Goal: General experience of comfort will improve Outcome: Not Progressing Note:  Complaining of increased pain, 10/10. Extra dose of oxycodone administered per MD order, see MAR.

## 2018-03-21 NOTE — Progress Notes (Signed)
CSW received recommendation for SNF placement. No accepting facilities at this time able to maintain fentanyl pump. Patient discharging home.   CSW signing off.  Percell Locus Dent Plantz LCSW [phone]

## 2018-03-21 NOTE — Discharge Instructions (Signed)
1)Chronic back pain--- mobility related difficulties due to pain----continue your pain medications as prescribed by your pain specialist from [facility],   2) you will receive home health physical therapy to help you with improve mobility 3) please follow-up with your pain specialist on 03/23/2018 to discuss further adjustments to your pain regimen 4) your blood sugars are borderline low, decrease your metformin to 500 mg once a day instead of twice a day to avoid low blood sugars  Please note  You were cared for by a hospitalist Physician during your hospital stay. Once you are discharged, your primary care physician will handle any further medical issues. Please note that NO REFILLS for any discharge medications will be authorized once you are discharged, as it is imperative that you return to your primary care physician (or establish a relationship with a primary care physician if you do not have one) for your aftercare needs so that they can reassess your need for medications and monitor your lab values

## 2018-04-01 DIAGNOSIS — Z9689 Presence of other specified functional implants: Secondary | ICD-10-CM | POA: Diagnosis not present

## 2018-04-01 DIAGNOSIS — G894 Chronic pain syndrome: Secondary | ICD-10-CM | POA: Diagnosis not present

## 2018-04-07 DIAGNOSIS — R5383 Other fatigue: Secondary | ICD-10-CM | POA: Diagnosis not present

## 2018-04-07 DIAGNOSIS — E1165 Type 2 diabetes mellitus with hyperglycemia: Secondary | ICD-10-CM | POA: Diagnosis not present

## 2018-04-07 DIAGNOSIS — M545 Low back pain: Secondary | ICD-10-CM | POA: Diagnosis not present

## 2018-04-07 DIAGNOSIS — Z09 Encounter for follow-up examination after completed treatment for conditions other than malignant neoplasm: Secondary | ICD-10-CM

## 2018-04-07 DIAGNOSIS — K210 Gastro-esophageal reflux disease with esophagitis: Secondary | ICD-10-CM | POA: Diagnosis not present

## 2018-04-21 ENCOUNTER — Ambulatory Visit: Payer: Medicare Other

## 2018-05-18 ENCOUNTER — Other Ambulatory Visit: Payer: Self-pay

## 2018-05-18 MED ORDER — ATENOLOL 100 MG PO TABS: 100.0000 mg | ORAL_TABLET | Freq: Every day | ORAL | 0 refills | Status: DC

## 2018-05-18 MED ORDER — METFORMIN HCL 500 MG PO TABS: 500.0000 mg | ORAL_TABLET | Freq: Two times a day (BID) | ORAL | 0 refills | Status: DC

## 2018-05-19 ENCOUNTER — Other Ambulatory Visit: Payer: Self-pay | Admitting: Internal Medicine

## 2018-05-19 MED ORDER — ATENOLOL 100 MG PO TABS: 100.0000 mg | ORAL_TABLET | Freq: Every day | ORAL | 0 refills | Status: DC

## 2018-05-23 ENCOUNTER — Encounter: Payer: Self-pay | Admitting: Internal Medicine

## 2018-05-27 DIAGNOSIS — M5440 Lumbago with sciatica, unspecified side: Secondary | ICD-10-CM

## 2018-05-27 DIAGNOSIS — G8929 Other chronic pain: Secondary | ICD-10-CM | POA: Insufficient documentation

## 2018-06-04 ENCOUNTER — Ambulatory Visit: Payer: Self-pay | Admitting: Internal Medicine

## 2018-06-23 ENCOUNTER — Encounter: Payer: Self-pay | Admitting: Internal Medicine

## 2018-06-23 ENCOUNTER — Ambulatory Visit (INDEPENDENT_AMBULATORY_CARE_PROVIDER_SITE_OTHER): Payer: Medicare Other | Admitting: Internal Medicine

## 2018-06-23 VITALS — BP 140/82 | HR 63 | Temp 98.0°F | Ht 63.0 in | Wt 148.4 lb

## 2018-06-23 DIAGNOSIS — J40 Bronchitis, not specified as acute or chronic: Secondary | ICD-10-CM | POA: Diagnosis not present

## 2018-06-23 MED ORDER — BENZONATATE 100 MG PO CAPS: ORAL_CAPSULE | ORAL | 0 refills | Status: DC

## 2018-06-23 MED ORDER — AZITHROMYCIN 250 MG PO TABS: ORAL_TABLET | ORAL | 0 refills | Status: AC

## 2018-06-23 NOTE — Progress Notes (Signed)
Subjective:     Patient ID: Linda Mercado , female    DOB: [birth year] , 52 y.o.   MRN: [id number]   Chief Complaint  Patient presents with  . URI    patient states she has been running a fever, has had some congestion and her ribs hurt    HPI Onset of body aches with stuffy nose, cough and  PND since last week. Has been using using OTC meds, and helped very little. Has had fever up to 101.3 Her cough is productive but cant spit it out. Nose congestion is yellow. She is prone getting secondary infections when she gets sick.    Past Medical History:  Diagnosis Date  . Bradycardia   . Chronic back pain   . Diabetes mellitus without complication ([place])   . Hypertension   . Hypokalemia      Family History  Problem Relation Age of Onset  . Diabetes Father   . Hypertension Father   . Diabetes Other   . Hypertension Other   . Stroke Neg Hx   . Cancer Neg Hx   . CAD Neg Hx      Current Outpatient Medications:  .  acetaminophen (TYLENOL) 500 MG tablet, Take 2 tablets (1,000 mg total) by mouth 3 (three) times daily at 8am, 2pm and bedtime., Disp: 30 tablet, Rfl: 0 .  albuterol (PROVENTIL HFA;VENTOLIN HFA) 108 (90 Base) MCG/ACT inhaler, Inhale 2 puffs into the lungs every 6 (six) hours as needed for wheezing or shortness of breath. , Disp: , Rfl:  .  atenolol (TENORMIN) 100 MG tablet, TAKE 1 TABLET(100 MG) BY MOUTH DAILY, Disp: 90 tablet, Rfl: 0 .  atorvastatin (LIPITOR) 20 MG tablet, Take 20 mg by mouth at bedtime., Disp: , Rfl:  .  clonazePAM (KLONOPIN) 1 MG tablet, Take 1 mg by mouth 3 (three) times daily., Disp: , Rfl:  .  diclofenac sodium (VOLTAREN) 1 % GEL, Apply 4 g topically 4 (four) times daily as needed for pain., Disp: , Rfl:  .  divalproex (DEPAKOTE ER) 500 MG 24 hr tablet, Take 1,000 mg by mouth daily. , Disp: , Rfl: 1 .  docusate sodium (COLACE) 50 MG capsule, Take 50 mg by mouth at bedtime., Disp: , Rfl:  .  etodolac (LODINE) 400 MG tablet, Take 400 mg by mouth 2  (two) times daily., Disp: , Rfl:  .  fluticasone (FLOVENT HFA) 110 MCG/ACT inhaler, Inhale 2 puffs into the lungs every 12 (twelve) hours., Disp: , Rfl:  .  hydrochlorothiazide (MICROZIDE) 12.5 MG capsule, Take 12.5 mg by mouth daily., Disp: , Rfl:  .  hydrOXYzine (ATARAX/VISTARIL) 25 MG tablet, Take 25 mg by mouth 2 (two) times daily. , Disp: , Rfl:  .  losartan (COZAAR) 25 MG tablet, Take 25 mg by mouth daily., Disp: , Rfl:  .  metFORMIN (GLUCOPHAGE) 500 MG tablet, Take 1 tablet (500 mg total) by mouth 2 (two) times daily with a meal., Disp: 180 tablet, Rfl: 0 .  NARCAN 4 MG/0.1ML LIQD nasal spray kit, Place 1 spray into the nose as directed., Disp: , Rfl: 0 .  Norgestimate-Ethinyl Estradiol Triphasic (TRI-ESTARYLLA) 0.18/0.215/0.25 MG-35 MCG tablet, Take 1 tablet by mouth daily., Disp: , Rfl:  .  ondansetron (ZOFRAN) 4 MG tablet, Take 4 mg by mouth every 6 (six) hours as needed for nausea or vomiting., Disp: , Rfl:  .  oxyCODONE (OXY IR/ROXICODONE) 5 MG immediate release tablet, Take 5 mg by mouth 4 (four) times daily., Disp: ,  Rfl: 0 .  polyethylene glycol (MIRALAX / GLYCOLAX) packet, Take 17 g by mouth daily. For Bowels, Disp: 30 each, Rfl: 3 .  PRESCRIPTION MEDICATION, Pain pump (Fentanyl 100 mcg/ml) and Clonidine (150 mcg/ml): Continuous and patient may bolus and extra 2 mcg four times a day, Disp: , Rfl:  .  promethazine (PHENERGAN) 25 MG tablet, Take 25 mg by mouth every 6 (six) hours as needed for nausea or vomiting., Disp: , Rfl:  .  QUEtiapine (SEROQUEL XR) 400 MG 24 hr tablet, Take 400 mg by mouth 2 (two) times daily., Disp: , Rfl:  .  senna-docusate (SENOKOT-S) 8.6-50 MG tablet, Take 2 tablets by mouth at bedtime., Disp: 60 tablet, Rfl: 1 .  tiZANidine (ZANAFLEX) 4 MG tablet, Take 4 mg by mouth every 8 (eight) hours as needed (for nerve pain). , Disp: , Rfl:  .  traZODone (DESYREL) 100 MG tablet, Take 1 tablet (100 mg total) by mouth at bedtime., Disp: 30 tablet, Rfl: 3 .   triamcinolone cream (KENALOG) 0.1 %, Apply 1 application topically daily as needed (for itching). , Disp: , Rfl:  .  vortioxetine HBr (TRINTELLIX) 10 MG TABS tablet, Take 10 mg by mouth at bedtime., Disp: , Rfl:    Allergies  Allergen Reactions  . Lisinopril Hives    Other reaction(s): anaphylaxis/angioedema  . Latex Hives  . Adhesive [Tape] Rash  . Sulfamethoxazole-Trimethoprim Diarrhea    Other reaction(s): gi distress     Review of Systems  Constitutional: Positive for appetite change, chills, diaphoresis, fatigue and fever. Negative for activity change.  HENT: Positive for congestion, ear pain, postnasal drip, rhinorrhea and sinus pressure. Negative for ear discharge, sore throat and trouble swallowing.   Eyes: Negative for discharge.  Respiratory: Positive for cough, shortness of breath and wheezing. Negative for chest tightness.   Cardiovascular: Negative for chest pain, palpitations and leg swelling.  Gastrointestinal: Negative for nausea and vomiting.  Musculoskeletal: Positive for myalgias.  Skin: Negative for rash.  Hematological: Negative for adenopathy.     Today's Vitals   06/23/18 1612  BP: 140/82  Pulse: 63  Temp: 98 F (36.7 C)  TempSrc: Oral  SpO2: 96%  Weight: 148 lb 6.4 oz (67.3 kg)  Height: 5' 3"  (1.6 m)  PainSc: 10-Worst pain ever  PainLoc: Back   Body mass index is 26.29 kg/m.   Objective:  Physical Exam  Constitutional: She is oriented to person, place, and time. She appears well-developed. No distress.  Looks like she does not feel well  HENT:  Head: Normocephalic.  Right Ear: External ear normal.  Left Ear: External ear normal.  Nose: Nose normal.  Mouth/Throat: Oropharynx is clear and moist. No oropharyngeal exudate.  Both TMs gray and shiny. All her sinuses are tender  Eyes: Conjunctivae are normal. Right eye exhibits no discharge. Left eye exhibits no discharge. No scleral icterus.  Neck: Neck supple.  Cardiovascular: Normal rate and  regular rhythm.  No murmur heard. Pulmonary/Chest: Effort normal and breath sounds normal. No respiratory distress. She has no wheezes.  Has a wheezy sounding cough  Lymphadenopathy:    She has no cervical adenopathy.  Neurological: She is alert and oriented to person, place, and time.  Skin: Skin is warm and dry. No rash noted. She is not diaphoretic.  Psychiatric: She has a normal mood and affect. Her behavior is normal. Judgment and thought content normal.  Nursing note and vitals reviewed.   Assessment And Plan:     Bronchitis- I placed her  on Zpack and tessalon perless as noted. I advised her to use her inhaler q 4-5 h for 5 days then prn.  Fu if she gets worse in 48h.   Abelino Tippin RODRIGUEZ-SOUTHWORTH, PA-C

## 2018-06-23 NOTE — Patient Instructions (Addendum)
Try using the NETI POT  Rinse kit and YouTube it to see how to use it.      Acute Bronchitis, Adult Acute bronchitis is when air tubes (bronchi) in the lungs suddenly get swollen. The condition can make it hard to breathe. It can also cause these symptoms:  A cough.  Coughing up clear, yellow, or green mucus.  Wheezing.  Chest congestion.  Shortness of breath.  A fever.  Body aches.  Chills.  A sore throat.  Follow these instructions at home: Medicines  Take over-the-counter and prescription medicines only as told by your doctor.  If you were prescribed an antibiotic medicine, take it as told by your doctor. Do not stop taking the antibiotic even if you start to feel better. General instructions  Rest.  Drink enough fluids to keep your pee (urine) clear or pale yellow.  Avoid smoking and secondhand smoke. If you smoke and you need help quitting, ask your doctor. Quitting will help your lungs heal faster.  Use an inhaler, cool mist vaporizer, or humidifier as told by your doctor.  Keep all follow-up visits as told by your doctor. This is important. How is this prevented? To lower your risk of getting this condition again:  Wash your hands often with soap and water. If you cannot use soap and water, use hand sanitizer.  Avoid contact with people who have cold symptoms.  Try not to touch your hands to your mouth, nose, or eyes.  Make sure to get the flu shot every year.  Contact a doctor if:  Your symptoms do not get better in 2 weeks. Get help right away if:  You cough up blood.  You have chest pain.  You have very bad shortness of breath.  You become dehydrated.  You faint (pass out) or keep feeling like you are going to pass out.  You keep throwing up (vomiting).  You have a very bad headache.  Your fever or chills gets worse. This information is not intended to replace advice given to you by your health care provider. Make sure you discuss  any questions you have with your health care provider. Document Released: 12/25/2007 Document Revised: 02/14/2016 Document Reviewed: 12/27/2015 Elsevier Interactive Patient Education  [employer].

## 2018-06-24 ENCOUNTER — Encounter: Payer: Self-pay | Admitting: Internal Medicine

## 2018-07-01 ENCOUNTER — Other Ambulatory Visit: Payer: Self-pay | Admitting: Internal Medicine

## 2018-07-01 NOTE — Progress Notes (Signed)
I called pt and reviewed the question she had about Hernia mesh found on imaging from pain MD. Pt was told to make apt to be examined.

## 2018-07-02 ENCOUNTER — Ambulatory Visit: Payer: Medicare Other | Admitting: Internal Medicine

## 2018-07-16 ENCOUNTER — Ambulatory Visit (HOSPITAL_COMMUNITY)
Admission: EM | Admit: 2018-07-16 | Discharge: 2018-07-16 | Disposition: A | Discharge status: Home / Self Care | Payer: Medicare Other | Attending: Family Medicine | Admitting: Family Medicine

## 2018-07-16 ENCOUNTER — Encounter (HOSPITAL_COMMUNITY): Payer: Self-pay | Admitting: Emergency Medicine

## 2018-07-16 DIAGNOSIS — N760 Acute vaginitis: Secondary | ICD-10-CM | POA: Insufficient documentation

## 2018-07-16 MED ORDER — ONDANSETRON HCL 4 MG PO TABS: 4.0000 mg | ORAL_TABLET | Freq: Four times a day (QID) | ORAL | 0 refills | Status: DC

## 2018-07-16 MED ORDER — FLUCONAZOLE 150 MG PO TABS: 150.0000 mg | ORAL_TABLET | Freq: Every day | ORAL | 0 refills | Status: DC

## 2018-07-16 MED ORDER — METRONIDAZOLE 500 MG PO TABS: 500.0000 mg | ORAL_TABLET | Freq: Two times a day (BID) | ORAL | 0 refills | Status: DC

## 2018-07-16 NOTE — ED Provider Notes (Signed)
[place]    CSN: [id number] Arrival date & time: 07/16/18  2585     History   Chief Complaint Chief Complaint  Patient presents with  . Vaginal Itching    HPI Linda Mercado is a 52 y.o. female.   HPI  Patient is here for vaginal itching and discomfort.  Scant discharge.  No odor.  She did take antibiotics 2 weeks ago.  Uncertain whether this is a yeast infection or BV.  She has had both.  She has not had any unprotected sexual relations or concern for STD.  No fever chills.  No abdominal pain.  No urinary symptoms. She also requests a refill of her Zofran medication for nausea.  Past Medical History:  Diagnosis Date  . Bradycardia   . Chronic back pain   . Diabetes mellitus without complication ([place])   . Hypertension   . Hypokalemia     Patient Active Problem List   Diagnosis Date Noted  . Intractable pain 03/20/2018  . Bradycardia   . Chronic pain syndrome 03/18/2018  . 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 Hypokalemia 03/18/2018  . Intractable back pain 01/03/2018  . HTN (hypertension) 01/03/2018  . Type 2 diabetes mellitus without complication ([place]) 27/78/2423  . Vitamin D deficiency 07/11/2017  . Postmenopausal 04/24/2017  . Microalbuminuria 01/14/2017  . Dyspareunia in female 12/10/2016  . Endometriosis determined by laparoscopy 12/10/2016  . History of exploratory laparotomy 12/10/2016  . Pelvic pain 12/10/2016  . Bipolar 1 disorder ([place]) 09/19/2016  . Cyst of right ovary 09/19/2016  . Severe episode of recurrent major depressive disorder, with psychotic features ([place]) 09/19/2016  . Schizoaffective disorder, bipolar type ([place]) 09/19/2016  . Chronic pain 03/05/2016  . Non morbid obesity due to excess calories 03/05/2016  . S/P lumbar fusion 03/05/2016    History reviewed. No pertinent surgical history.  OB History   No obstetric history on file.      Home Medications    Prior to Admission medications   Medication Sig Start  Date End Date Taking? Authorizing Provider  acetaminophen (TYLENOL) 500 MG tablet Take 2 tablets (1,000 mg total) by mouth 3 (three) times daily at 8am, 2pm and bedtime. 03/21/18   Roxan Hockey, MD  albuterol (PROVENTIL HFA;VENTOLIN HFA) 108 (90 Base) MCG/ACT inhaler Inhale 2 puffs into the lungs every 6 (six) hours as needed for wheezing or shortness of breath.     [provider]  atenolol (TENORMIN) 100 MG tablet TAKE 1 TABLET(100 MG) BY MOUTH DAILY 05/19/18   Rodriguez-Southworth, [name], PA-C  atorvastatin (LIPITOR) 20 MG tablet Take 20 mg by mouth at bedtime. 11/20/17   [provider]  benzonatate (TESSALON PERLES) 100 MG capsule 1-2 tid prn cough 06/23/18   Rodriguez-Southworth, [name], PA-C  clonazePAM (KLONOPIN) 1 MG tablet Take 1 mg by mouth 3 (three) times daily.    [provider]  diclofenac sodium (VOLTAREN) 1 % GEL Apply 4 g topically 4 (four) times daily as needed for pain. 04/24/17   [provider]  divalproex (DEPAKOTE ER) 500 MG 24 hr tablet Take 1,000 mg by mouth daily.  02/09/18   [provider]  docusate sodium (COLACE) 50 MG capsule Take 50 mg by mouth at bedtime.    [provider]  etodolac (LODINE) 400 MG tablet Take 400 mg by mouth 2 (two) times daily.    [provider]  fluconazole (DIFLUCAN) 150 MG tablet Take 1 tablet (150 mg total) by mouth daily. Repeat  in 1 week if needed 07/16/18   Raylene Everts, MD  fluticasone [facility] HFA) 110 MCG/ACT inhaler Inhale 2 puffs into the lungs every 12 (twelve) hours.    [provider]  hydrochlorothiazide (MICROZIDE) 12.5 MG capsule Take 12.5 mg by mouth daily.    [provider]  hydrOXYzine (ATARAX/VISTARIL) 25 MG tablet Take 25 mg by mouth 2 (two) times daily.     [provider]  losartan (COZAAR) 25 MG tablet Take 25 mg by mouth daily.    [provider]  metFORMIN (GLUCOPHAGE) 500 MG tablet Take 1 tablet (500 mg total) by  mouth 2 (two) times daily with a meal. 05/18/18   Rodriguez-Southworth, [name], PA-C  metroNIDAZOLE (FLAGYL) 500 MG tablet Take 1 tablet (500 mg total) by mouth 2 (two) times daily. 07/16/18   Raylene Everts, MD  NARCAN 4 MG/0.1ML LIQD nasal spray kit Place 1 spray into the nose as directed. 01/19/18   [provider]  Norgestimate-Ethinyl Estradiol Triphasic (TRI-ESTARYLLA) 0.18/0.215/0.25 MG-35 MCG tablet Take 1 tablet by mouth daily.    [provider]  ondansetron (ZOFRAN) 4 MG tablet Take 4 mg by mouth every 6 (six) hours as needed for nausea or vomiting.    [provider]  ondansetron (ZOFRAN) 4 MG tablet Take 1 tablet (4 mg total) by mouth every 6 (six) hours. 07/16/18   Raylene Everts, MD  oxyCODONE (OXY IR/ROXICODONE) 5 MG immediate release tablet Take 5 mg by mouth 4 (four) times daily. 12/30/17   [provider]  polyethylene glycol (MIRALAX / GLYCOLAX) packet Take 17 g by mouth daily. For Bowels 03/22/18   Roxan Hockey, MD  PRESCRIPTION MEDICATION Pain pump (Fentanyl 100 mcg/ml) and Clonidine (150 mcg/ml): Continuous and patient may bolus and extra 2 mcg four times a day    [provider]  promethazine (PHENERGAN) 25 MG tablet Take 25 mg by mouth every 6 (six) hours as needed for nausea or vomiting.    [provider]  QUEtiapine (SEROQUEL XR) 400 MG 24 hr tablet Take 400 mg by mouth 2 (two) times daily.    [provider]  senna-docusate (SENOKOT-S) 8.6-50 MG tablet Take 2 tablets by mouth at bedtime. 03/21/18 03/21/19  Roxan Hockey, MD  tiZANidine (ZANAFLEX) 4 MG tablet Take 4 mg by mouth every 8 (eight) hours as needed (for nerve pain).     [provider]  traZODone (DESYREL) 100 MG tablet Take 1 tablet (100 mg total) by mouth at bedtime. 03/21/18   Roxan Hockey, MD  triamcinolone cream (KENALOG) 0.1 % Apply 1 application topically daily as needed (for itching).     [provider]    vortioxetine HBr (TRINTELLIX) 10 MG TABS tablet Take 10 mg by mouth at bedtime.    [provider]    Family History Family History  Problem Relation Age of Onset  . Diabetes Father   . Hypertension Father   . Diabetes Other   . Hypertension Other   . Stroke Neg Hx   . Cancer Neg Hx   . CAD Neg Hx     Social History Social History   Tobacco Use  . Smoking status: Never Smoker  . Smokeless tobacco: Never Used  Substance Use Topics  . Alcohol use: Yes    Alcohol/week: 1.0 standard drinks    Types: 1 Glasses of wine per week    Frequency: Never  . Drug use: Never     Allergies   Lisinopril; Latex;  Adhesive [tape]; and Sulfamethoxazole-trimethoprim   Review of Systems Review of Systems  Constitutional: Negative for chills and fever.  HENT: Negative for ear pain and sore throat.   Eyes: Negative for pain and visual disturbance.  Respiratory: Negative for cough and shortness of breath.   Cardiovascular: Negative for chest pain and palpitations.  Gastrointestinal: Negative for abdominal pain and vomiting.  Genitourinary: Positive for vaginal discharge. Negative for dysuria and hematuria.  Musculoskeletal: Negative for arthralgias and back pain.  Skin: Negative for color change and rash.  Neurological: Negative for seizures and syncope.  All other systems reviewed and are negative.    Physical Exam Triage Vital Signs ED Triage Vitals [07/16/18 1032]  Enc Vitals Group     BP 130/84     Pulse Rate 61     Resp 16     Temp (!) 97.3 F (36.3 C)     Temp Source Oral     SpO2 100 %     Weight      Height      Head Circumference      Peak Flow      Pain Score 7     Pain Loc      Pain Edu?      Excl. in [place]?    No data found.  Updated Vital Signs BP 130/84 (BP Location: Left Arm)   Pulse 61   Temp (!) 97.3 F (36.3 C) (Oral)   Resp 16   LMP 07/11/2018   SpO2 100%      Physical Exam Constitutional:      General: She is not in acute  distress.    Appearance: She is well-developed.     Comments: Appears moderately sedated  HENT:     Head: Normocephalic and atraumatic.  Eyes:     Conjunctiva/sclera: Conjunctivae normal.     Pupils: Pupils are equal, round, and reactive to light.  Neck:     Musculoskeletal: Normal range of motion.  Cardiovascular:     Rate and Rhythm: Normal rate.  Pulmonary:     Effort: Pulmonary effort is normal. No respiratory distress.  Abdominal:     General: There is no distension.     Palpations: Abdomen is soft.  Genitourinary:    Comments: GU exam deferred Musculoskeletal: Normal range of motion.  Skin:    General: Skin is warm and dry.  Neurological:     General: No focal deficit present.     Mental Status: Mental status is at baseline.      UC Treatments / Results  Labs (all labs ordered are listed, but only abnormal results are displayed) Labs Reviewed - No data to display  EKG None  Radiology No results found.  Procedures Procedures (including critical care time)  Medications Ordered in UC Medications - No data to display  Initial Impression / Assessment and Plan / UC Course  I have reviewed the triage vital signs and the nursing notes.  Pertinent labs & imaging results that were available during my care of the patient were reviewed by me and considered in my medical decision making (see chart for details).     Zofran is refilled.  Patient is treated for her vaginitis.  Cultures not obtained due to lack of suspicion for STDs.  She will return if she fails to see improvement Final Clinical Impressions(s) / UC Diagnoses   Final diagnoses:  Vaginitis and vulvovaginitis     Discharge Instructions     Continue to avoid using  harsh soaps feminine wash Take metronidazole 3 times a day for 7 days Take Diflucan today, and then again in 7 days I have refilled your Zofran Follow-up with your primary care doctor   ED Prescriptions    Medication Sig Dispense  Auth. Provider   metroNIDAZOLE (FLAGYL) 500 MG tablet Take 1 tablet (500 mg total) by mouth 2 (two) times daily. 14 tablet Raylene Everts, MD   fluconazole (DIFLUCAN) 150 MG tablet Take 1 tablet (150 mg total) by mouth daily. Repeat in 1 week if needed 2 tablet Raylene Everts, MD   ondansetron (ZOFRAN) 4 MG tablet Take 1 tablet (4 mg total) by mouth every 6 (six) hours. 30 tablet Raylene Everts, MD     Controlled Substance Prescriptions [place] Controlled Substance Registry consulted? Not Applicable   Raylene Everts, MD 07/16/18 1122

## 2018-07-16 NOTE — ED Triage Notes (Signed)
Pt presents to [facility] for assessment of vaginal itching after using a new soap in her private areas.  Pt denies odor, c/o white discharge.  Hx of BV.  Pt is sexually active, LMP 07/11/18

## 2018-07-16 NOTE — ED Triage Notes (Signed)
Pt also c/o waking up in the morning intermittently x 2 weeks to nausea, goes away during the day time and returns at night.

## 2018-07-16 NOTE — Discharge Instructions (Addendum)
Continue to avoid using harsh soaps feminine wash Take metronidazole 3 times a day for 7 days Take Diflucan today, and then again in 7 days I have refilled your Zofran Follow-up with your primary care doctor

## 2018-07-17 ENCOUNTER — Other Ambulatory Visit: Payer: Self-pay

## 2018-07-17 ENCOUNTER — Encounter: Payer: Self-pay | Admitting: Internal Medicine

## 2018-07-20 ENCOUNTER — Other Ambulatory Visit: Payer: Self-pay | Admitting: Nurse Practitioner

## 2018-07-20 MED ORDER — HYDROCHLOROTHIAZIDE 12.5 MG PO CAPS: 12.5000 mg | ORAL_CAPSULE | Freq: Every day | ORAL | 0 refills | Status: DC

## 2018-07-23 ENCOUNTER — Other Ambulatory Visit: Payer: Self-pay | Admitting: Internal Medicine

## 2018-07-23 DIAGNOSIS — Z79899 Other long term (current) drug therapy: Secondary | ICD-10-CM

## 2018-07-23 NOTE — Progress Notes (Signed)
I tried ordering labs Linda Mercado APMHNP-BC requested for me to order, but epic will not accept her diagnosis, neither the thyroid panel. She was sent a note about this and to send pt to an outpatient lab directly

## 2018-07-28 ENCOUNTER — Ambulatory Visit: Payer: Medicare Other | Admitting: Internal Medicine

## 2018-08-03 ENCOUNTER — Other Ambulatory Visit: Payer: Self-pay | Admitting: Internal Medicine

## 2018-08-04 ENCOUNTER — Other Ambulatory Visit: Payer: Self-pay

## 2018-08-04 ENCOUNTER — Ambulatory Visit: Payer: Medicare Other | Admitting: Internal Medicine

## 2018-08-04 MED ORDER — OMEPRAZOLE 20 MG PO CPDR: 20.0000 mg | DELAYED_RELEASE_CAPSULE | Freq: Every day | ORAL | Status: DC

## 2018-08-13 ENCOUNTER — Emergency Department (HOSPITAL_COMMUNITY): Payer: Medicare Other

## 2018-08-13 ENCOUNTER — Other Ambulatory Visit: Payer: Self-pay

## 2018-08-13 ENCOUNTER — Encounter (HOSPITAL_COMMUNITY): Payer: Self-pay

## 2018-08-13 ENCOUNTER — Emergency Department (HOSPITAL_COMMUNITY)
Admission: EM | Admit: 2018-08-13 | Discharge: 2018-08-13 | Disposition: A | Discharge status: Home / Self Care | Payer: Medicare Other | Attending: Emergency Medicine | Admitting: Emergency Medicine

## 2018-08-13 DIAGNOSIS — I10 Essential (primary) hypertension: Secondary | ICD-10-CM | POA: Insufficient documentation

## 2018-08-13 DIAGNOSIS — R109 Unspecified abdominal pain: Secondary | ICD-10-CM | POA: Diagnosis not present

## 2018-08-13 DIAGNOSIS — Z79899 Other long term (current) drug therapy: Secondary | ICD-10-CM | POA: Diagnosis not present

## 2018-08-13 DIAGNOSIS — S0990XA Unspecified injury of head, initial encounter: Secondary | ICD-10-CM | POA: Insufficient documentation

## 2018-08-13 DIAGNOSIS — Z91040 Latex allergy status: Secondary | ICD-10-CM | POA: Insufficient documentation

## 2018-08-13 DIAGNOSIS — Y939 Activity, unspecified: Secondary | ICD-10-CM | POA: Insufficient documentation

## 2018-08-13 DIAGNOSIS — Y929 Unspecified place or not applicable: Secondary | ICD-10-CM | POA: Insufficient documentation

## 2018-08-13 DIAGNOSIS — Y999 Unspecified external cause status: Secondary | ICD-10-CM | POA: Insufficient documentation

## 2018-08-13 DIAGNOSIS — E119 Type 2 diabetes mellitus without complications: Secondary | ICD-10-CM | POA: Insufficient documentation

## 2018-08-13 DIAGNOSIS — W19XXXA Unspecified fall, initial encounter: Secondary | ICD-10-CM | POA: Insufficient documentation

## 2018-08-13 LAB — COMPREHENSIVE METABOLIC PANEL
ALT: 24 U/L (ref 0–44)
AST: 51 U/L — ABNORMAL HIGH (ref 15–41)
Albumin: 4.3 g/dL (ref 3.5–5.0)
Alkaline Phosphatase: 79 U/L (ref 38–126)
Anion gap: 13 (ref 5–15)
BUN: 8 mg/dL (ref 6–20)
CO2: 24 mmol/L (ref 22–32)
Calcium: 9.9 mg/dL (ref 8.9–10.3)
Chloride: 101 mmol/L (ref 98–111)
Creatinine, Ser: 0.72 mg/dL (ref 0.44–1.00)
GFR calc Af Amer: >60 mL/min (ref >60)
GFR calc non Af Amer: >60 mL/min (ref >60)
Glucose, Bld: 109 mg/dL — ABNORMAL HIGH (ref 70–99)
Potassium: 4.9 mmol/L (ref 3.5–5.1)
Sodium: 138 mmol/L (ref 135–145)
Total Bilirubin: 1.1 mg/dL (ref 0.3–1.2)
Total Protein: 8.2 g/dL — ABNORMAL HIGH (ref 6.5–8.1)

## 2018-08-13 LAB — VALPROIC ACID LEVEL: Valproic Acid Lvl: <10 ug/mL — ABNORMAL LOW (ref 50.0–100.0)

## 2018-08-13 LAB — CBC WITH DIFFERENTIAL/PLATELET
Abs Immature Granulocytes: 0 10*3/uL (ref 0.00–0.07)
Basophils Absolute: 0 10*3/uL (ref 0.0–0.1)
Basophils Relative: 1 %
Eosinophils Absolute: 0.2 10*3/uL (ref 0.0–0.5)
Eosinophils Relative: 3 %
HCT: 43.7 % (ref 36.0–46.0)
Hemoglobin: 13.9 g/dL (ref 12.0–15.0)
Immature Granulocytes: 0 %
Lymphocytes Relative: 48 %
Lymphs Abs: 2.7 10*3/uL (ref 0.7–4.0)
MCH: 27.1 pg (ref 26.0–34.0)
MCHC: 31.8 g/dL (ref 30.0–36.0)
MCV: 85.4 fL (ref 80.0–100.0)
Monocytes Absolute: 0.4 10*3/uL (ref 0.1–1.0)
Monocytes Relative: 6 %
Neutro Abs: 2.4 10*3/uL (ref 1.7–7.7)
Neutrophils Relative %: 42 %
Platelets: 374 10*3/uL (ref 150–400)
RBC: 5.12 MIL/uL — ABNORMAL HIGH (ref 3.87–5.11)
RDW: 14.6 % (ref 11.5–15.5)
WBC: 5.6 10*3/uL (ref 4.0–10.5)
nRBC: 0 % (ref 0.0–0.2)

## 2018-08-13 LAB — I-STAT BETA HCG BLOOD, ED (MC, WL, AP ONLY): I-stat hCG, quantitative: <5 m[IU]/mL (ref <5)

## 2018-08-13 LAB — TROPONIN I: Troponin I: <0.03 ng/mL (ref <0.03)

## 2018-08-13 MED ORDER — SODIUM CHLORIDE 0.9 % IV BOLUS
1000.0000 mL | Freq: Once | INTRAVENOUS | Status: AC
  Administered 2018-08-13: 1000 mL via INTRAVENOUS

## 2018-08-13 MED ORDER — HYDROMORPHONE HCL 1 MG/ML IJ SOLN
1.0000 mg | Freq: Once | INTRAMUSCULAR | Status: AC
  Administered 2018-08-13: 1 mg via INTRAVENOUS
  Filled 2018-08-13: qty 1

## 2018-08-13 MED ORDER — ONDANSETRON HCL 4 MG/2ML IJ SOLN
4.0000 mg | Freq: Once | INTRAMUSCULAR | Status: AC
  Administered 2018-08-13: 4 mg via INTRAVENOUS
  Filled 2018-08-13: qty 2

## 2018-08-13 MED ORDER — HYDROMORPHONE HCL 4 MG PO TABS: 4.0000 mg | ORAL_TABLET | Freq: Four times a day (QID) | ORAL | 0 refills | Status: DC | PRN

## 2018-08-13 NOTE — Discharge Instructions (Addendum)
Stop taking the oxycodone when you are on the dilaudid and follow up with your md next week

## 2018-08-13 NOTE — ED Provider Notes (Signed)
[facility] EMERGENCY DEPARTMENT Provider Note   CSN: [id number] Arrival date & time: 08/13/18  1531     History   Chief Complaint Chief Complaint  Patient presents with  . Fall    HPI Linda Mercado is a 53 y.o. female.  Patient states that she fell hit her head and complains of back pain..  Patient has chronic back pain.  No loss of consciousness  The history is provided by the patient. No language interpreter was used.  Fall  This is a new problem. The current episode started 1 to 2 hours ago. The problem occurs rarely. The problem has been resolved. Pertinent negatives include no chest pain, no abdominal pain and no headaches. Exacerbated by: Movement. Nothing relieves the symptoms. She has tried nothing for the symptoms. The treatment provided no relief.    Past Medical History:  Diagnosis Date  . Bradycardia   . Chronic back pain   . Diabetes mellitus without complication ([place])   . Hypertension   . Hypokalemia     Patient Active Problem List   Diagnosis Date Noted  . Intractable pain 03/20/2018  . Bradycardia   . Chronic pain syndrome 03/18/2018  . 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 Hypokalemia 03/18/2018  . Intractable back pain 01/03/2018  . HTN (hypertension) 01/03/2018  . Type 2 diabetes mellitus without complication ([place]) 12/45/8099  . Vitamin D deficiency 07/11/2017  . Postmenopausal 04/24/2017  . Microalbuminuria 01/14/2017  . Dyspareunia in female 12/10/2016  . Endometriosis determined by laparoscopy 12/10/2016  . History of exploratory laparotomy 12/10/2016  . Pelvic pain 12/10/2016  . Bipolar 1 disorder ([place]) 09/19/2016  . Cyst of right ovary 09/19/2016  . Severe episode of recurrent major depressive disorder, with psychotic features ([place]) 09/19/2016  . Schizoaffective disorder, bipolar type ([place]) 09/19/2016  . Chronic pain 03/05/2016  . Non morbid obesity due to excess calories 03/05/2016  . S/P lumbar fusion  03/05/2016    Past Surgical History:  Procedure Laterality Date  . BACK SURGERY       OB History   No obstetric history on file.      Home Medications    Prior to Admission medications   Medication Sig Start Date End Date Taking? Authorizing Provider  acetaminophen (TYLENOL) 500 MG tablet Take 2 tablets (1,000 mg total) by mouth 3 (three) times daily at 8am, 2pm and bedtime. 03/21/18   Roxan Hockey, MD  albuterol (PROVENTIL HFA;VENTOLIN HFA) 108 (90 Base) MCG/ACT inhaler Inhale 2 puffs into the lungs every 6 (six) hours as needed for wheezing or shortness of breath.     [provider]  atenolol (TENORMIN) 100 MG tablet TAKE 1 TABLET(100 MG) BY MOUTH DAILY 05/19/18   Rodriguez-Southworth, [name], PA-C  atorvastatin (LIPITOR) 20 MG tablet Take 20 mg by mouth at bedtime. 11/20/17   [provider]  benzonatate (TESSALON PERLES) 100 MG capsule 1-2 tid prn cough 06/23/18   Rodriguez-Southworth, [name], PA-C  clonazePAM (KLONOPIN) 1 MG tablet Take 1 mg by mouth 3 (three) times daily.    [provider]  diclofenac sodium (VOLTAREN) 1 % GEL Apply 4 g topically 4 (four) times daily as needed for pain. 04/24/17   [provider]  divalproex (DEPAKOTE ER) 500 MG 24 hr tablet Take 1,000 mg by mouth daily.  02/09/18   [provider]  docusate sodium (COLACE) 50 MG capsule Take 50 mg by mouth at bedtime.    [provider]  etodolac (LODINE) 400  MG tablet Take 400 mg by mouth 2 (two) times daily.    [provider]  fluconazole (DIFLUCAN) 150 MG tablet Take 1 tablet (150 mg total) by mouth daily. Repeat in 1 week if needed 07/16/18   Raylene Everts, MD  fluticasone [facility] HFA) 110 MCG/ACT inhaler Inhale 2 puffs into the lungs every 12 (twelve) hours.    [provider]  hydrochlorothiazide (MICROZIDE) 12.5 MG capsule Take 1 capsule (12.5 mg total) by mouth daily. 07/20/18   [name], FNP  HYDROmorphone (DILAUDID) 4  MG tablet Take 1 tablet (4 mg total) by mouth every 6 (six) hours as needed for severe pain. 08/13/18   Milton Ferguson, MD  hydrOXYzine (ATARAX/VISTARIL) 25 MG tablet Take 25 mg by mouth 2 (two) times daily.     [provider]  losartan (COZAAR) 25 MG tablet Take 25 mg by mouth daily.    [provider]  metFORMIN (GLUCOPHAGE) 500 MG tablet Take 1 tablet (500 mg total) by mouth 2 (two) times daily with a meal. 05/18/18   Rodriguez-Southworth, [name], PA-C  metroNIDAZOLE (FLAGYL) 500 MG tablet Take 1 tablet (500 mg total) by mouth 2 (two) times daily. 07/16/18   Raylene Everts, MD  NARCAN 4 MG/0.1ML LIQD nasal spray kit Place 1 spray into the nose as directed. 01/19/18   [provider]  Norgestimate-Ethinyl Estradiol Triphasic (TRI-ESTARYLLA) 0.18/0.215/0.25 MG-35 MCG tablet Take 1 tablet by mouth daily.    [provider]  omeprazole (PRILOSEC) 20 MG capsule Take 1 capsule (20 mg total) by mouth daily. 08/04/18   Rodriguez-Southworth, [name], PA-C  ondansetron (ZOFRAN) 4 MG tablet Take 4 mg by mouth every 6 (six) hours as needed for nausea or vomiting.    [provider]  ondansetron (ZOFRAN) 4 MG tablet Take 1 tablet (4 mg total) by mouth every 6 (six) hours. 07/16/18   Raylene Everts, MD  oxyCODONE (OXY IR/ROXICODONE) 5 MG immediate release tablet Take 5 mg by mouth 4 (four) times daily. 12/30/17   [provider]  polyethylene glycol (MIRALAX / GLYCOLAX) packet Take 17 g by mouth daily. For Bowels 03/22/18   Roxan Hockey, MD  PRESCRIPTION MEDICATION Pain pump (Fentanyl 100 mcg/ml) and Clonidine (150 mcg/ml): Continuous and patient may bolus and extra 2 mcg four times a day    [provider]  promethazine (PHENERGAN) 25 MG tablet Take 25 mg by mouth every 6 (six) hours as needed for nausea or vomiting.    [provider]  QUEtiapine (SEROQUEL XR) 400 MG 24 hr tablet Take 400 mg by mouth 2 (two) times daily.    [provider]  senna-docusate (SENOKOT-S) 8.6-50 MG tablet Take 2 tablets by mouth at bedtime. 03/21/18 03/21/19  Roxan Hockey, MD  tiZANidine (ZANAFLEX) 4 MG tablet Take 4 mg by mouth every 8 (eight) hours as needed (for nerve pain).     [provider]  traZODone (DESYREL) 100 MG tablet Take 1 tablet (100 mg total) by mouth at bedtime. 03/21/18   Roxan Hockey, MD  triamcinolone cream (KENALOG) 0.1 % Apply 1 application topically daily as needed (for itching).     [provider]  vortioxetine HBr (TRINTELLIX) 10 MG TABS tablet Take 10 mg by mouth at bedtime.    [provider]    Family History Family History  Problem Relation Age of Onset  . Diabetes Father   . Hypertension Father   . Diabetes Other   . Hypertension Other   .  Stroke Neg Hx   . Cancer Neg Hx   . CAD Neg Hx     Social History Social History   Tobacco Use  . Smoking status: Never Smoker  . Smokeless tobacco: Never Used  Substance Use Topics  . Alcohol use: Yes    Alcohol/week: 1.0 standard drinks    Types: 1 Glasses of wine per week    Frequency: Never  . Drug use: Never     Allergies   Lisinopril; Latex; Adhesive [tape]; and Sulfamethoxazole-trimethoprim   Review of Systems Review of Systems  Constitutional: Negative for appetite change and fatigue.  HENT: Negative for congestion, ear discharge and sinus pressure.   Eyes: Negative for discharge.  Respiratory: Negative for cough.   Cardiovascular: Negative for chest pain.  Gastrointestinal: Negative for abdominal pain and diarrhea.  Genitourinary: Negative for frequency and hematuria.  Musculoskeletal: Negative for back pain.  Skin: Negative for rash.  Neurological: Negative for seizures and headaches.       Neck pain  Psychiatric/Behavioral: Negative for hallucinations.     Physical Exam Updated Vital Signs BP (!) 172/98   Pulse (!) 59   Temp 97.8 F (36.6 C) (Oral)   Resp 17   Ht _0  (1.575 m)   Wt  71.2 kg   SpO2 100%   BMI 28.72 kg/m   Physical Exam Vitals signs and nursing note reviewed.  Constitutional:      Appearance: She is well-developed.  HENT:     Head: Normocephalic.     Comments: Tender back of head    Nose: Nose normal.  Eyes:     General: No scleral icterus.    Conjunctiva/sclera: Conjunctivae normal.  Neck:     Musculoskeletal: Neck supple.     Thyroid: No thyromegaly.  Cardiovascular:     Rate and Rhythm: Normal rate and regular rhythm.     Heart sounds: No murmur. No friction rub. No gallop.   Pulmonary:     Breath sounds: No stridor. No wheezing or rales.  Chest:     Chest wall: No tenderness.  Abdominal:     General: There is no distension.     Tenderness: There is no abdominal tenderness. There is no rebound.  Musculoskeletal: Normal range of motion.     Comments: Lumbar spine tenderness  Lymphadenopathy:     Cervical: No cervical adenopathy.  Skin:    Findings: No erythema or rash.  Neurological:     Mental Status: She is oriented to person, place, and time.     Motor: No abnormal muscle tone.     Coordination: Coordination normal.  Psychiatric:        Behavior: Behavior normal.      ED Treatments / Results  Labs (all labs ordered are listed, but only abnormal results are displayed) Labs Reviewed  CBC WITH DIFFERENTIAL/PLATELET - Abnormal; Notable for the following components:      Result Value   RBC 5.12 (*)    All other components within normal limits  COMPREHENSIVE METABOLIC PANEL - Abnormal; Notable for the following components:   Glucose, Bld 109 (*)    Total Protein 8.2 (*)    AST 51 (*)    All other components within normal limits  TROPONIN I  VALPROIC ACID LEVEL  I-STAT BETA HCG BLOOD, ED ([facility], [facility], [facility] ONLY)    EKG None  Radiology Ct Abdomen Pelvis Wo Contrast  Result Date: 08/13/2018 CLINICAL DATA:  53 year old female with abdominal pain and distension. EXAM: CT  ABDOMEN AND PELVIS WITHOUT CONTRAST TECHNIQUE:  Multidetector CT imaging of the abdomen and pelvis was performed following the standard protocol without IV contrast. COMPARISON:  03/18/2018 lumbar spine CT FINDINGS: Please note that parenchymal abnormalities may be missed without intravenous contrast. Lower chest: No acute abnormality. Mild LEFT basilar scarring noted. Hepatobiliary: The liver and gallbladder are unremarkable. No biliary dilatation. Pancreas: Unremarkable Spleen: No significant abnormality. Adrenals/Urinary Tract: The kidneys, adrenal glands and bladder are unremarkable. Stomach/Bowel: There is no evidence of bowel obstruction, definite bowel wall thickening or inflammatory changes. Vascular/Lymphatic: No significant vascular findings are present. No enlarged abdominal or pelvic lymph nodes. Reproductive: Uterus and bilateral adnexa are unremarkable. Other: Anterior abdominal wall surgical changes/mass noted. No ascites or pneumoperitoneum identified. Musculoskeletal: An intrathecal pump is again identified. L5-S1 posterior fusion changes again noted. No acute bony abnormalities are identified. IMPRESSION: 1. No evidence of acute abnormality. 2. No findings to suggest a cause for this patient's abdominal pain/distension. Electronically Signed   By: Margarette Canada M.D.   On: 08/13/2018 18:44   Ct Head Wo Contrast  Result Date: 08/13/2018 CLINICAL DATA:  Fall hitting head on refrigerator. EXAM: CT HEAD WITHOUT CONTRAST CT CERVICAL SPINE WITHOUT CONTRAST TECHNIQUE: Multidetector CT imaging of the head and cervical spine was performed following the standard protocol without intravenous contrast. Multiplanar CT image reconstructions of the cervical spine were also generated. COMPARISON:  03/18/2018 FINDINGS: CT HEAD FINDINGS Brain: Ventricles, cisterns and other CSF spaces are normal. There is no mass, mass effect, shift of midline structures or acute hemorrhage. No evidence of acute infarction. Vascular: No hyperdense vessel or unexpected  calcification. Skull: Normal. Negative for fracture or focal lesion. Sinuses/Orbits: No acute finding. Other: None. CT CERVICAL SPINE FINDINGS Alignment: Normal. Skull base and vertebrae: Vertebral body heights are normal. There is mild to moderate spondylosis throughout the cervical spine. Moderate uncovertebral joint spurring is present mild bilateral neural foraminal narrowing at the C4-5 level. Mild right-sided neural foraminal narrowing at the C6-7 level. No acute fracture or subluxation. Soft tissues and spinal canal: No prevertebral fluid or swelling. No visible canal hematoma. Disc levels: Minimal disc space narrowing at the C3-4, C4-5 and C6-7 levels. Upper chest: Negative. Other: None. IMPRESSION: No acute brain injury. No acute cervical spine injury. 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 Electronically Signed   By: Marin Olp M.D.   On: 08/13/2018 18:41   Ct Cervical Spine Wo Contrast  Result Date: 08/13/2018 CLINICAL DATA:  Fall hitting head on refrigerator. EXAM: CT HEAD WITHOUT CONTRAST CT CERVICAL SPINE WITHOUT CONTRAST TECHNIQUE: Multidetector CT imaging of the head and cervical spine was performed following the standard protocol without intravenous contrast. Multiplanar CT image reconstructions of the cervical spine were also generated. COMPARISON:  03/18/2018 FINDINGS: CT HEAD FINDINGS Brain: Ventricles, cisterns and other CSF spaces are normal. There is no mass, mass effect, shift of midline structures or acute hemorrhage. No evidence of acute infarction. Vascular: No hyperdense vessel or unexpected calcification. Skull: Normal. Negative for fracture or focal lesion. Sinuses/Orbits: No acute finding. Other: None. CT CERVICAL SPINE FINDINGS Alignment: Normal. Skull base and vertebrae: Vertebral body heights are normal. There is mild to moderate spondylosis throughout the cervical spine.  Moderate uncovertebral joint spurring is present mild bilateral neural foraminal narrowing at the C4-5 level. Mild right-sided neural foraminal narrowing at the C6-7 level. No acute fracture or subluxation. Soft tissues and spinal canal: No prevertebral fluid or  swelling. No visible canal hematoma. Disc levels: Minimal disc space narrowing at the C3-4, C4-5 and C6-7 levels. Upper chest: Negative. Other: None. IMPRESSION: No acute brain injury. No acute cervical spine injury. 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 Electronically Signed   By: Marin Olp M.D.   On: 08/13/2018 18:41    Procedures Procedures (including critical care time)  Medications Ordered in ED Medications  sodium chloride 0.9 % bolus 1,000 mL (1,000 mLs Intravenous New Bag/Given 08/13/18 1852)  HYDROmorphone (DILAUDID) injection 1 mg (1 mg Intravenous Given 08/13/18 1853)     Initial Impression / Assessment and Plan / ED Course  I have reviewed the triage vital signs and the nursing notes.  Pertinent labs & imaging results that were available during my care of the patient were reviewed by me and considered in my medical decision making (see chart for details).     X-rays and lab work unremarkable.  Diagnosis fall with contusion to head and lumbar spine sprain.  Patient is taking immediate release oxycodone 4 times a day.  She states that the Dilaudid she was given here worked better so she is given a 5-day prescription Dilaudid told to stop the oxycodone.  She will follow-up with her PCP Final Clinical Impressions(s) / ED Diagnoses   Final diagnoses:  Fall, initial encounter    ED Discharge Orders         Ordered    HYDROmorphone (DILAUDID) 4 MG tablet  Every 6 hours PRN     08/13/18 1913           Milton Ferguson, MD 08/13/18 1916

## 2018-08-13 NOTE — ED Triage Notes (Signed)
Pt fell unwitnessed, possible LOC (pt does not remember fall).  Does remember getting dizzy and thinks she fell and hit head on fridge. Head pain, no obvious trauma to head. Found at home by dtr. Hx back fractures with fentynyl pmp and other pain Rxs.

## 2018-08-19 DIAGNOSIS — Z5181 Encounter for therapeutic drug level monitoring: Secondary | ICD-10-CM | POA: Diagnosis not present

## 2018-08-25 ENCOUNTER — Ambulatory Visit: Payer: Medicare Other | Admitting: Internal Medicine

## 2018-09-04 ENCOUNTER — Other Ambulatory Visit: Payer: Self-pay

## 2018-09-08 ENCOUNTER — Other Ambulatory Visit: Payer: Self-pay

## 2018-09-08 DIAGNOSIS — K219 Gastro-esophageal reflux disease without esophagitis: Secondary | ICD-10-CM

## 2018-09-08 MED ORDER — DEXLANSOPRAZOLE 60 MG PO CPDR: 60.0000 mg | DELAYED_RELEASE_CAPSULE | Freq: Every day | ORAL | 1 refills | Status: DC

## 2018-09-09 ENCOUNTER — Other Ambulatory Visit: Payer: Self-pay | Admitting: Internal Medicine

## 2018-09-16 ENCOUNTER — Other Ambulatory Visit: Payer: Self-pay | Admitting: Internal Medicine

## 2018-09-17 ENCOUNTER — Encounter: Payer: Self-pay | Admitting: Internal Medicine

## 2018-09-17 ENCOUNTER — Ambulatory Visit (INDEPENDENT_AMBULATORY_CARE_PROVIDER_SITE_OTHER): Payer: Medicare Other | Admitting: Internal Medicine

## 2018-09-17 ENCOUNTER — Other Ambulatory Visit: Payer: Self-pay

## 2018-09-17 VITALS — BP 122/74 | HR 69 | Temp 97.8°F | Ht 64.2 in | Wt 155.0 lb

## 2018-09-17 DIAGNOSIS — E1121 Type 2 diabetes mellitus with diabetic nephropathy: Secondary | ICD-10-CM | POA: Diagnosis not present

## 2018-09-17 DIAGNOSIS — E119 Type 2 diabetes mellitus without complications: Secondary | ICD-10-CM

## 2018-09-17 DIAGNOSIS — G8929 Other chronic pain: Secondary | ICD-10-CM | POA: Diagnosis not present

## 2018-09-17 DIAGNOSIS — Z0000 Encounter for general adult medical examination without abnormal findings: Secondary | ICD-10-CM

## 2018-09-17 DIAGNOSIS — M5440 Lumbago with sciatica, unspecified side: Secondary | ICD-10-CM | POA: Diagnosis not present

## 2018-09-17 DIAGNOSIS — L989 Disorder of the skin and subcutaneous tissue, unspecified: Secondary | ICD-10-CM

## 2018-09-17 DIAGNOSIS — I10 Essential (primary) hypertension: Secondary | ICD-10-CM | POA: Diagnosis not present

## 2018-09-17 DIAGNOSIS — K219 Gastro-esophageal reflux disease without esophagitis: Secondary | ICD-10-CM

## 2018-09-17 DIAGNOSIS — Z3041 Encounter for surveillance of contraceptive pills: Secondary | ICD-10-CM | POA: Diagnosis not present

## 2018-09-17 DIAGNOSIS — M5030 Other cervical disc degeneration, unspecified cervical region: Secondary | ICD-10-CM

## 2018-09-17 DIAGNOSIS — Z30019 Encounter for initial prescription of contraceptives, unspecified: Secondary | ICD-10-CM

## 2018-09-17 DIAGNOSIS — R296 Repeated falls: Secondary | ICD-10-CM | POA: Insufficient documentation

## 2018-09-17 LAB — POCT URINALYSIS DIPSTICK
Bilirubin, UA: NEGATIVE
Glucose, UA: NEGATIVE
Ketones, UA: NEGATIVE
Nitrite, UA: NEGATIVE
Protein, UA: POSITIVE — AB
Spec Grav, UA: >=1.03 — AB (ref 1.010–1.025)
Urobilinogen, UA: 0.2 E.U./dL
pH, UA: 6 (ref 5.0–8.0)

## 2018-09-17 MED ORDER — ALBUTEROL SULFATE HFA 108 (90 BASE) MCG/ACT IN AERS: 2.0000 | INHALATION_SPRAY | Freq: Four times a day (QID) | RESPIRATORY_TRACT | 2 refills | Status: DC | PRN

## 2018-09-17 MED ORDER — ATENOLOL 100 MG PO TABS: ORAL_TABLET | ORAL | 2 refills | Status: DC

## 2018-09-17 MED ORDER — METFORMIN HCL 500 MG PO TABS: 500.0000 mg | ORAL_TABLET | Freq: Two times a day (BID) | ORAL | 0 refills | Status: DC

## 2018-09-17 MED ORDER — OMEPRAZOLE 20 MG PO CPDR: 20.0000 mg | DELAYED_RELEASE_CAPSULE | Freq: Every day | ORAL | Status: DC

## 2018-09-17 MED ORDER — POLYETHYLENE GLYCOL 3350 17 G PO PACK: 17.0000 g | PACK | Freq: Every day | ORAL | 3 refills | Status: DC

## 2018-09-17 MED ORDER — ATORVASTATIN CALCIUM 20 MG PO TABS: 20.0000 mg | ORAL_TABLET | Freq: Every day | ORAL | 0 refills | Status: DC

## 2018-09-17 MED ORDER — PROMETHAZINE HCL 25 MG PO TABS: 25.0000 mg | ORAL_TABLET | Freq: Four times a day (QID) | ORAL | 0 refills | Status: DC | PRN

## 2018-09-17 MED ORDER — NORGESTIM-ETH ESTRAD TRIPHASIC 0.18/0.215/0.25 MG-35 MCG PO TABS: 1.0000 | ORAL_TABLET | Freq: Every day | ORAL | 2 refills | Status: DC

## 2018-09-17 MED ORDER — HYDROCHLOROTHIAZIDE 12.5 MG PO CAPS: 12.5000 mg | ORAL_CAPSULE | Freq: Every day | ORAL | 0 refills | Status: DC

## 2018-09-17 MED ORDER — LOSARTAN POTASSIUM 25 MG PO TABS: 25.0000 mg | ORAL_TABLET | Freq: Every day | ORAL | 0 refills | Status: DC

## 2018-09-17 NOTE — Patient Instructions (Signed)
  Linda Mercado , Thank you for taking time to come for your Medicare Wellness Visit. I appreciate your ongoing commitment to your health goals. Please review the following plan we discussed and let me know if I can assist you in the future.   These are the goals we discussed: Goals   None     This is a list of the screening recommended for you and due dates:  Health Maintenance  Topic Date Due  . Pneumococcal vaccine  01/18/1968  . Complete foot exam   01/18/1976  . Eye exam for diabetics  01/18/1976  . Tetanus Vaccine  01/17/1985  . Pap Smear  01/18/1987  . Mammogram  01/18/2016  . Colon Cancer Screening  01/18/2016  . Hemoglobin A1C  09/19/2018  . Flu Shot  Completed  . HIV Screening  Completed

## 2018-09-17 NOTE — Progress Notes (Signed)
Subjective:     Patient ID: Linda Mercado , female    DOB: Dec 14, 1965 , 53 y.o.   MRN: [id number]   Chief Complaint  Patient presents with  . Medicare Wellness    HPI She is here for Welcome to Medicare visit. She was offered assistance from her pain MD to get help, but she declined.  She needs refills on her chronic medications.   Past Medical History:  Diagnosis Date  . Bradycardia   . Chronic back pain   . Diabetes mellitus without complication ([place])   . Hypertension   . Hypokalemia      Family History  Problem Relation Age of Onset  . Diabetes Father   . Hypertension Father   . Diabetes Other   . Hypertension Other   . Stroke Neg Hx   . Cancer Neg Hx   . CAD Neg Hx      Current Outpatient Medications:  .  acetaminophen (TYLENOL) 500 MG tablet, Take 2 tablets (1,000 mg total) by mouth 3 (three) times daily at 8am, 2pm and bedtime., Disp: 30 tablet, Rfl: 0 .  albuterol (PROVENTIL HFA;VENTOLIN HFA) 108 (90 Base) MCG/ACT inhaler, Inhale 2 puffs into the lungs every 6 (six) hours as needed for wheezing or shortness of breath. , Disp: , Rfl:  .  atenolol (TENORMIN) 100 MG tablet, TAKE 1 TABLET(100 MG) BY MOUTH DAILY, Disp: 90 tablet, Rfl: 0 .  atorvastatin (LIPITOR) 20 MG tablet, Take 20 mg by mouth at bedtime., Disp: , Rfl:  .  clonazePAM (KLONOPIN) 1 MG tablet, Take 1 mg by mouth 3 (three) times daily., Disp: , Rfl:  .  dexlansoprazole (DEXILANT) 60 MG capsule, Take 1 capsule (60 mg total) by mouth daily., Disp: 90 capsule, Rfl: 1 .  diclofenac sodium (VOLTAREN) 1 % GEL, Apply 4 g topically 4 (four) times daily as needed for pain., Disp: , Rfl:  .  divalproex (DEPAKOTE ER) 500 MG 24 hr tablet, Take 1,000 mg by mouth daily. , Disp: , Rfl: 1 .  docusate sodium (COLACE) 50 MG capsule, Take 50 mg by mouth at bedtime., Disp: , Rfl:  .  etodolac (LODINE) 400 MG tablet, Take 400 mg by mouth 2 (two) times daily., Disp: , Rfl:  .  fluticasone (FLOVENT HFA) 110 MCG/ACT inhaler,  Inhale 2 puffs into the lungs every 12 (twelve) hours., Disp: , Rfl:  .  FREESTYLE LITE test strip, CHECK BLOOD SUGAR 3 TO 4 TIMES DAILY, Disp: 150 each, Rfl: 11 .  hydrochlorothiazide (MICROZIDE) 12.5 MG capsule, Take 1 capsule (12.5 mg total) by mouth daily., Disp: 90 capsule, Rfl: 0 .  hydrOXYzine (ATARAX/VISTARIL) 25 MG tablet, Take 25 mg by mouth 2 (two) times daily. , Disp: , Rfl:  .  losartan (COZAAR) 25 MG tablet, Take 25 mg by mouth daily., Disp: , Rfl:  .  metFORMIN (GLUCOPHAGE) 500 MG tablet, Take 1 tablet (500 mg total) by mouth 2 (two) times daily with a meal., Disp: 180 tablet, Rfl: 0 .  NARCAN 4 MG/0.1ML LIQD nasal spray kit, Place 1 spray into the nose as directed., Disp: , Rfl: 0 .  Norgestimate-Ethinyl Estradiol Triphasic (TRI-ESTARYLLA) 0.18/0.215/0.25 MG-35 MCG tablet, Take 1 tablet by mouth daily., Disp: , Rfl:  .  omeprazole (PRILOSEC) 20 MG capsule, Take 1 capsule (20 mg total) by mouth daily., Disp: 90 capsule, Rfl: o .  ondansetron (ZOFRAN) 4 MG tablet, Take 1 tablet (4 mg total) by mouth every 6 (six) hours., Disp: 30 tablet, Rfl:  0 .  oxyCODONE (OXY IR/ROXICODONE) 5 MG immediate release tablet, Take 5 mg by mouth 4 (four) times daily., Disp: , Rfl: 0 .  polyethylene glycol (MIRALAX / GLYCOLAX) packet, Take 17 g by mouth daily. For Bowels, Disp: 30 each, Rfl: 3 .  PRESCRIPTION MEDICATION, Pain pump (Fentanyl 100 mcg/ml) and Clonidine (150 mcg/ml): Continuous and patient may bolus and extra 2 mcg four times a day, Disp: , Rfl:  .  promethazine (PHENERGAN) 25 MG tablet, Take 25 mg by mouth every 6 (six) hours as needed for nausea or vomiting., Disp: , Rfl:  .  QUEtiapine (SEROQUEL XR) 400 MG 24 hr tablet, Take 400 mg by mouth 2 (two) times daily., Disp: , Rfl:  .  senna-docusate (SENOKOT-S) 8.6-50 MG tablet, Take 2 tablets by mouth at bedtime., Disp: 60 tablet, Rfl: 1 .  tiZANidine (ZANAFLEX) 4 MG tablet, Take 4 mg by mouth every 8 (eight) hours as needed (for nerve pain). ,  Disp: , Rfl:  .  traZODone (DESYREL) 100 MG tablet, Take 1 tablet (100 mg total) by mouth at bedtime., Disp: 30 tablet, Rfl: 3 .  triamcinolone cream (KENALOG) 0.1 %, Apply 1 application topically daily as needed (for itching). , Disp: , Rfl:  .  vortioxetine HBr (TRINTELLIX) 10 MG TABS tablet, Take 10 mg by mouth at bedtime., Disp: , Rfl:  .  benzonatate (TESSALON PERLES) 100 MG capsule, 1-2 tid prn cough (Patient not taking: Reported on 09/17/2018), Disp: 30 capsule, Rfl: 0 .  fluconazole (DIFLUCAN) 150 MG tablet, Take 1 tablet (150 mg total) by mouth daily. Repeat in 1 week if needed (Patient not taking: Reported on 09/17/2018), Disp: 2 tablet, Rfl: 0 .  hydrochlorothiazide (HYDRODIURIL) 12.5 MG tablet, Take 1 tablet by mouth once daily. (Patient not taking: Reported on 09/17/2018), Disp: 90 tablet, Rfl: 0 .  HYDROmorphone (DILAUDID) 4 MG tablet, Take 1 tablet (4 mg total) by mouth every 6 (six) hours as needed for severe pain. (Patient not taking: Reported on 09/17/2018), Disp: 20 tablet, Rfl: 0 .  metroNIDAZOLE (FLAGYL) 500 MG tablet, Take 1 tablet (500 mg total) by mouth 2 (two) times daily. (Patient not taking: Reported on 09/17/2018), Disp: 14 tablet, Rfl: 0 .  ondansetron (ZOFRAN) 4 MG tablet, Take 4 mg by mouth every 6 (six) hours as needed for nausea or vomiting., Disp: , Rfl:  .  VRAYLAR 6 MG CAPS, Take 1 capsule by mouth daily., Disp: , Rfl:    Allergies  Allergen Reactions  . Lisinopril Hives    Other reaction(s): anaphylaxis/angioedema  . Latex Hives  . Adhesive [Tape] Rash  . Sulfamethoxazole-Trimethoprim Diarrhea    Other reaction(s): gi distress     Review of Systems  Constitutional: Positive for fatigue. Negative for activity change, appetite change and diaphoresis.  HENT: Positive for postnasal drip and sneezing. Negative for congestion.   Eyes: Negative for pain, discharge and visual disturbance.  Respiratory: Negative for cough and shortness of breath.   Cardiovascular:  Negative for chest pain, palpitations and leg swelling.  Gastrointestinal: Positive for nausea. Negative for abdominal pain, constipation and diarrhea.  Endocrine: Negative for cold intolerance, heat intolerance, polydipsia and polyphagia.  Genitourinary: Negative for dysuria, flank pain, frequency and urgency.  Musculoskeletal: Positive for back pain, gait problem, neck pain and neck stiffness.       Has frequent falls from her chronic pain and when her L leg gives away  Skin: Negative for rash and wound.       Has a  raised skin lesion on her R thigh which has been there for years and gets snagged when she puts pants on. Would like to have it removed.   Neurological: Positive for syncope, weakness and headaches.       Fainted 4 weeks ago from a fall and went to ER. Was told she had a concussion. Had neg CT scan.  Gets intermittent HA's from chronic neck pain, this gets flairs off and on.   Psychiatric/Behavioral:       Anxiety and depression are stable on her meds     Today's Vitals   09/17/18 1055  BP: 122/74  Pulse: 69  Temp: 97.8 F (36.6 C)  TempSrc: Oral  SpO2: 98%  Weight: 155 lb (70.3 kg)  Height: 5' 4.2" (1.631 m)   Body mass index is 26.44 kg/m.   Objective:  Physical Exam Vitals signs and nursing note reviewed.  Constitutional:      General: She is not in acute distress.    Appearance: She is not toxic-appearing.     Comments: Seems in pain and uses a walker to ambulate. She does not have her dog with her today.   HENT:     Head: Normocephalic.     Right Ear: External ear normal.     Left Ear: External ear normal.     Nose: Nose normal.  Eyes:     General: No scleral icterus.       Right eye: No discharge.        Left eye: No discharge.     Conjunctiva/sclera: Conjunctivae normal.  Neck:     Musculoskeletal: Neck supple.  Cardiovascular:     Rate and Rhythm: Normal rate and regular rhythm.     Pulses: Normal pulses.     Heart sounds: No murmur.    Pulmonary:     Effort: Pulmonary effort is normal.     Breath sounds: Normal breath sounds.  Musculoskeletal: Normal range of motion.  Lymphadenopathy:     Cervical: No cervical adenopathy.  Skin:    General: Skin is warm and dry.     Comments: Has a black raised 1/2 x 1/2 skin lesion on R thigh which looks keratotic.   Neurological:     Mental Status: She is alert and oriented to person, place, and time.  Psychiatric:        Mood and Affect: Mood normal.        Behavior: Behavior normal.        Thought Content: Thought content normal.        Judgment: Judgment normal.         Assessment And Plan:       Linda Tillman RODRIGUEZ-SOUTHWORTH, PA-C    Subjective:    Linda Mercado is a 53 y.o. female who presents for a Welcome to Medicare exam.  Cardiac Risk Factors include: diabetes mellitus;hypertension      Objective:    Today's Vitals   09/17/18 1055  BP: 122/74  Pulse: 69  Temp: 97.8 F (36.6 C)  TempSrc: Oral  SpO2: 98%  Weight: 155 lb (70.3 kg)  Height: 5' 4.2" (1.631 m)  Body mass index is 26.44 kg/m.  Medications Outpatient Encounter Medications as of 09/17/2018  Medication Sig  . acetaminophen (TYLENOL) 500 MG tablet Take 2 tablets (1,000 mg total) by mouth 3 (three) times daily at 8am, 2pm and bedtime.  [name] albuterol (PROVENTIL HFA;VENTOLIN HFA) 108 (90 Base) MCG/ACT inhaler Inhale 2 puffs into the lungs every 6 (six)  hours as needed for wheezing or shortness of breath.  [name] atenolol (TENORMIN) 100 MG tablet TAKE 1 TABLET(100 MG) BY MOUTH DAILY  . atorvastatin (LIPITOR) 20 MG tablet Take 1 tablet (20 mg total) by mouth at bedtime.  . clonazePAM (KLONOPIN) 1 MG tablet Take 1 mg by mouth 3 (three) times daily.  [name] dexlansoprazole (DEXILANT) 60 MG capsule Take 1 capsule (60 mg total) by mouth daily.  . diclofenac sodium (VOLTAREN) 1 % GEL Apply 4 g topically 4 (four) times daily as needed for pain.  . divalproex (DEPAKOTE ER) 500 MG 24 hr tablet Take 1,000 mg by  mouth daily.   [name] docusate sodium (COLACE) 50 MG capsule Take 50 mg by mouth at bedtime.  [name] etodolac (LODINE) 400 MG tablet Take 400 mg by mouth 2 (two) times daily.  . fluticasone (FLOVENT HFA) 110 MCG/ACT inhaler Inhale 2 puffs into the lungs every 12 (twelve) hours.  [name] FREESTYLE LITE test strip CHECK BLOOD SUGAR 3 TO 4 TIMES DAILY  . hydrochlorothiazide (MICROZIDE) 12.5 MG capsule Take 1 capsule (12.5 mg total) by mouth daily.  . hydrOXYzine (ATARAX/VISTARIL) 25 MG tablet Take 25 mg by mouth 2 (two) times daily.   [name] losartan (COZAAR) 25 MG tablet Take 1 tablet (25 mg total) by mouth daily.  . metFORMIN (GLUCOPHAGE) 500 MG tablet Take 1 tablet (500 mg total) by mouth 2 (two) times daily with a meal.  . NARCAN 4 MG/0.1ML LIQD nasal spray kit Place 1 spray into the nose as directed.  . Norgestimate-Ethinyl Estradiol Triphasic (TRI-ESTARYLLA) 0.18/0.215/0.25 MG-35 MCG tablet Take 1 tablet by mouth daily.  [name] omeprazole (PRILOSEC) 20 MG capsule Take 1 capsule (20 mg total) by mouth daily.  . ondansetron (ZOFRAN) 4 MG tablet Take 1 tablet (4 mg total) by mouth every 6 (six) hours.  [name] oxyCODONE (OXY IR/ROXICODONE) 5 MG immediate release tablet Take 5 mg by mouth 4 (four) times daily.  . polyethylene glycol (MIRALAX / GLYCOLAX) packet Take 17 g by mouth daily. For Bowels  . promethazine (PHENERGAN) 25 MG tablet Take 1 tablet (25 mg total) by mouth every 6 (six) hours as needed for nausea or vomiting.  [name] QUEtiapine (SEROQUEL XR) 400 MG 24 hr tablet Take 400 mg by mouth 2 (two) times daily.  [name] tiZANidine (ZANAFLEX) 4 MG tablet Take 4 mg by mouth every 8 (eight) hours as needed (for nerve pain).   . traZODone (DESYREL) 100 MG tablet Take 1 tablet (100 mg total) by mouth at bedtime.  . triamcinolone cream (KENALOG) 0.1 % Apply 1 application topically daily as needed (for itching).   . vortioxetine HBr (TRINTELLIX) 10 MG TABS tablet Take 10 mg by mouth at bedtime.  . [DISCONTINUED] albuterol (PROVENTIL  HFA;VENTOLIN HFA) 108 (90 Base) MCG/ACT inhaler Inhale 2 puffs into the lungs every 6 (six) hours as needed for wheezing or shortness of breath.   . [DISCONTINUED] atenolol (TENORMIN) 100 MG tablet TAKE 1 TABLET(100 MG) BY MOUTH DAILY  . [DISCONTINUED] atorvastatin (LIPITOR) 20 MG tablet Take 20 mg by mouth at bedtime.  . [DISCONTINUED] hydrochlorothiazide (MICROZIDE) 12.5 MG capsule Take 1 capsule (12.5 mg total) by mouth daily.  . [DISCONTINUED] losartan (COZAAR) 25 MG tablet Take 25 mg by mouth daily.  . [DISCONTINUED] metFORMIN (GLUCOPHAGE) 500 MG tablet Take 1 tablet (500 mg total) by mouth 2 (two) times daily with a meal.  . [DISCONTINUED] Norgestimate-Ethinyl Estradiol Triphasic (TRI-ESTARYLLA) 0.18/0.215/0.25 MG-35 MCG tablet Take 1 tablet by mouth daily.  . [DISCONTINUED] omeprazole (  PRILOSEC) 20 MG capsule Take 1 capsule (20 mg total) by mouth daily.  . [DISCONTINUED] polyethylene glycol (MIRALAX / GLYCOLAX) packet Take 17 g by mouth daily. For Bowels  . [DISCONTINUED] PRESCRIPTION MEDICATION Pain pump (Fentanyl 100 mcg/ml) and Clonidine (150 mcg/ml): Continuous and patient may bolus and extra 2 mcg four times a day  . [DISCONTINUED] promethazine (PHENERGAN) 25 MG tablet Take 25 mg by mouth every 6 (six) hours as needed for nausea or vomiting.  . [DISCONTINUED] senna-docusate (SENOKOT-S) 8.6-50 MG tablet Take 2 tablets by mouth at bedtime.  . fluconazole (DIFLUCAN) 150 MG tablet Take 1 tablet (150 mg total) by mouth daily. Repeat in 1 week if needed (Patient not taking: Reported on 09/17/2018)  . HYDROmorphone (DILAUDID) 4 MG tablet Take 1 tablet (4 mg total) by mouth every 6 (six) hours as needed for severe pain. (Patient not taking: Reported on 09/17/2018)  . VRAYLAR 6 MG CAPS Take 1 capsule by mouth daily.  . [DISCONTINUED] benzonatate (TESSALON PERLES) 100 MG capsule 1-2 tid prn cough (Patient not taking: Reported on 09/17/2018)  . [DISCONTINUED] hydrochlorothiazide (HYDRODIURIL) 12.5 MG  tablet Take 1 tablet by mouth once daily. (Patient not taking: Reported on 09/17/2018)  . [DISCONTINUED] metroNIDAZOLE (FLAGYL) 500 MG tablet Take 1 tablet (500 mg total) by mouth 2 (two) times daily. (Patient not taking: Reported on 09/17/2018)  . [DISCONTINUED] ondansetron (ZOFRAN) 4 MG tablet Take 4 mg by mouth every 6 (six) hours as needed for nausea or vomiting.   No facility-administered encounter medications on file as of 09/17/2018.      History: Past Medical History:  Diagnosis Date  . Bradycardia   . Chronic back pain   . Diabetes mellitus without complication ([place])   . Hypertension   . Hypokalemia    Past Surgical History:  Procedure Laterality Date  . BACK SURGERY      Family History  Problem Relation Age of Onset  . Diabetes Father   . Hypertension Father   . Diabetes Other   . Hypertension Other   . Stroke Neg Hx   . Cancer Neg Hx   . CAD Neg Hx    Social History   Occupational History  . Not on file  Tobacco Use  . Smoking status: Never Smoker  . Smokeless tobacco: Never Used  Substance and Sexual Activity  . Alcohol use: Not Currently    Frequency: Never  . Drug use: Never  . Sexual activity: Yes    Tobacco Counseling Counseling given: Not Answered   Immunizations and Health Maintenance Immunization History  Administered Date(s) Administered  . Influenza Inj Mdck Quad With Preservative 05/11/2018  . Influenza,inj,Quad PF,6+ Mos 09/19/2016, 04/24/2017   Health Maintenance Due  Topic Date Due  . PNEUMOCOCCAL POLYSACCHARIDE VACCINE AGE 76-64 HIGH RISK  01/18/1968  . FOOT EXAM  01/18/1976  . OPHTHALMOLOGY EXAM  01/18/1976  . TETANUS/TDAP  01/17/1985  . PAP SMEAR-Modifier  01/18/1987  . MAMMOGRAM  01/18/2016  . COLONOSCOPY  01/18/2016    Activities of Daily Living In your present state of health, do you have any difficulty performing the following activities: 09/17/2018 09/17/2018  Hearing? N N  Vision? Y N  Comment sometimes when she gets  dizzy -  Difficulty concentrating or making decisions? Tempie Donning  Walking or climbing stairs? Y Y  Comment due to chronic back pain -  Dressing or bathing? Y Y  Comment at least 3 days a week when she is in more pain, her daughter's help her -  Doing errands, shopping? Y Y  Comment when in increased pain yes and her daughter has to drive her -  Conservation officer, nature and eating ? N Y  Using the Toilet? N N  In the past six months, have you accidently leaked urine? N N  Do you have problems with loss of bowel control? N N  Managing your Medications? N Y  Comment with the help of pill pack -  Managing your Finances? N Y  Housekeeping or managing your Housekeeping? Y N    Advanced Directives: Does Patient Have a Medical Advance Directive?: No Would patient like information on creating a medical advance directive?: (Pt was given a form to complete)    Assessment:    1. Frequent falls- due to back lumbar radiculopathy.  - Referral to Chronic Care Management Services  2. Chronic midline low back pain with sciatica, sciatica laterality unspecified- chronic, will continue seeing pain management.   3. Essential hypertension- stable May continue current meds - CMP14 + Anion Gap - POCT Urinalysis Dipstick (81002)  4. Controlled type 2 diabetes mellitus with diabetic nephropathy, without long-term current use of insulin ([place])- chronic. May continue current med.  - Hemoglobin A1c - POCT Urinalysis Dipstick (81002)  5. Skin lesion- new. Will come back for removal in a few week.   6. Medicare annual wellness visit, initial- routine. Fu 1y  7. Gastroesophageal reflux disease without esophagitis- chronic, stable on current med.   9. DDD (degenerative disc disease), cervical- chronic. Will continue seeing specialist.   10- encounter for birth control- I refilled her oral contraceptives. Needs pap in June this year.   Vision/Hearing screen  Hearing Screening   Method: Audiometry   125Hz  250Hz  500Hz   1000Hz  2000Hz  3000Hz  4000Hz  6000Hz  8000Hz   Right ear: Pass Pass Pass Pass Pass Pass Pass Pass Pass  Left ear: Pass Pass Pass p Pass Pass Pass Pass Pass    Visual Acuity Screening   Right eye Left eye Both eyes  Without correction:     With correction: 20/20 20/20 20/15     Dietary issues and exercise activities discussed:  Current Exercise Habits: The patient does not participate in regular exercise at present, Exercise limited by: orthopedic condition(s)  Goals   None    Depression Screen PHQ 2/9 Scores 09/17/2018 06/23/2018  PHQ - 2 Score 6 4  PHQ- 9 Score 19 22     Fall Risk Fall Risk  09/17/2018  Falls in the past year? 1  Number falls in past yr: 1  Injury with Fall? 1  Risk for fall due to : History of fall(s)    Cognitive Function: normal     6CIT Screen 09/17/2018 09/17/2018  What Year? 0 points 0 points  What month? 0 points 0 points  What time? 0 points 0 points  Count back from 20 0 points 0 points  Months in reverse 0 points 2 points  Repeat phrase 10 points 2 points  Total Score 10 4    Patient Care Team: Mercado, Sandrea Matte as PCP - General (Internal Medicine)     Plan:   CCM team referral was made.   I have personally reviewed and noted the following in the patient's chart:   . Medical and social history . Use of alcohol, tobacco or illicit drugs  . Current medications and supplements . Functional ability and status . Nutritional status . Physical activity . Advanced directives . List of other physicians . Hospitalizations, surgeries, and ER visits in previous 65  months . Vitals . Screenings to include cognitive, depression, and falls . Referrals and appointments  In addition, I have reviewed and discussed with patient certain preventive protocols, quality metrics, and best practice recommendations. A written personalized care plan for preventive services as well as general preventive health recommendations were provided to  patient.     Christiana Gurevich RODRIGUEZ-SOUTHWORTH, PA-C 09/17/2018

## 2018-09-18 LAB — CMP14 + ANION GAP
ALT: 9 IU/L (ref 0–32)
AST: 9 IU/L (ref 0–40)
Albumin/Globulin Ratio: 1.5 (ref 1.2–2.2)
Albumin: 4.6 g/dL (ref 3.8–4.9)
Alkaline Phosphatase: 110 IU/L (ref 39–117)
Anion Gap: 17 mmol/L (ref 10.0–18.0)
BUN/Creatinine Ratio: 15 (ref 9–23)
BUN: 13 mg/dL (ref 6–24)
Bilirubin Total: <0.2 mg/dL (ref 0.0–1.2)
CO2: 22 mmol/L (ref 20–29)
Calcium: 10.5 mg/dL — ABNORMAL HIGH (ref 8.7–10.2)
Chloride: 102 mmol/L (ref 96–106)
Creatinine, Ser: 0.85 mg/dL (ref 0.57–1.00)
GFR calc Af Amer: 91 mL/min/{1.73_m2} (ref >59)
GFR calc non Af Amer: 79 mL/min/{1.73_m2} (ref >59)
Globulin, Total: 3.1 g/dL (ref 1.5–4.5)
Glucose: 135 mg/dL — ABNORMAL HIGH (ref 65–99)
Potassium: 3.9 mmol/L (ref 3.5–5.2)
Sodium: 141 mmol/L (ref 134–144)
Total Protein: 7.7 g/dL (ref 6.0–8.5)

## 2018-09-18 LAB — HEMOGLOBIN A1C
Est. average glucose Bld gHb Est-mCnc: 140 mg/dL
Hgb A1c MFr Bld: 6.5 % — ABNORMAL HIGH (ref 4.8–5.6)

## 2018-09-22 ENCOUNTER — Ambulatory Visit: Payer: Self-pay

## 2018-09-22 DIAGNOSIS — E119 Type 2 diabetes mellitus without complications: Secondary | ICD-10-CM

## 2018-09-22 DIAGNOSIS — I10 Essential (primary) hypertension: Secondary | ICD-10-CM

## 2018-09-22 DIAGNOSIS — R296 Repeated falls: Secondary | ICD-10-CM

## 2018-09-22 NOTE — Chronic Care Management (AMB) (Signed)
   Chronic Care Management   Outreach Note  09/22/2018 Name: MEKAELA AZIZI MRN: [id number] DOB: 05-12-1966  Referred by: Shelby Mattocks, PA-C Reason for referral : Care Coordination ([place])  An unsuccessful telephone outreach to [employer]. Bibian was attempted today. Ms. Basurto was referred to the case management team by Shelby Mattocks, PA-C for assistance with Diabetes Mellitus, frequent falls and Hypertension.    Follow Up Plan: The CM team will reach out to the patient again over the next 7 days.    Barb Merino, RN,CCM Care Management Coordinator [place] [facility]  Direct Phone: [phone]

## 2018-09-23 ENCOUNTER — Ambulatory Visit: Payer: Self-pay

## 2018-09-23 DIAGNOSIS — E119 Type 2 diabetes mellitus without complications: Secondary | ICD-10-CM

## 2018-09-23 DIAGNOSIS — R296 Repeated falls: Secondary | ICD-10-CM

## 2018-09-23 DIAGNOSIS — I10 Essential (primary) hypertension: Secondary | ICD-10-CM

## 2018-09-23 NOTE — Chronic Care Management (AMB) (Signed)
  Care Management   Note  09/23/2018 Name: FOYE HAGGART MRN: [id number] DOB: 1966-02-19    Chronic Care Management   Outreach Note  09/23/2018 Name: YASMYN BELLISARIO MRN: [id number] DOB: 1966-04-12  Referred by: Shelby Mattocks, PA-C Reason for referral : Care Coordination (#[facility] [place])   Second unsuccessful telephone outreach to [employer]. [place] was attempted today. Ms. Dorough was referred to the case management team by Shelby Mattocks, PA-C for assistance with Diabetes Mellitus, frequent falls and Hypertension.   Follow Up Plan: The CM team will reach out to the patient again over the next 7-10 days.    Barb Merino, RN,CCM Care Management Coordinator [place] [facility]  Direct Phone: [phone]

## 2018-09-26 ENCOUNTER — Encounter: Payer: Self-pay | Admitting: Internal Medicine

## 2018-09-28 ENCOUNTER — Other Ambulatory Visit: Payer: Self-pay

## 2018-09-28 DIAGNOSIS — E785 Hyperlipidemia, unspecified: Secondary | ICD-10-CM

## 2018-09-28 DIAGNOSIS — I10 Essential (primary) hypertension: Secondary | ICD-10-CM

## 2018-09-28 MED ORDER — ATORVASTATIN CALCIUM 20 MG PO TABS: 20.0000 mg | ORAL_TABLET | Freq: Every day | ORAL | 0 refills | Status: DC

## 2018-09-30 ENCOUNTER — Other Ambulatory Visit: Payer: Self-pay | Admitting: Internal Medicine

## 2018-09-30 MED ORDER — CALCIUM CITRATE MALATE-VIT D 250-100 MG-UNIT PO TABS: ORAL_TABLET | ORAL | 2 refills | Status: DC

## 2018-10-01 ENCOUNTER — Ambulatory Visit: Payer: Medicare Other | Admitting: Internal Medicine

## 2018-10-04 ENCOUNTER — Other Ambulatory Visit: Payer: Self-pay | Admitting: Internal Medicine

## 2018-10-05 ENCOUNTER — Ambulatory Visit (INDEPENDENT_AMBULATORY_CARE_PROVIDER_SITE_OTHER): Payer: Medicare Other

## 2018-10-05 DIAGNOSIS — I10 Essential (primary) hypertension: Secondary | ICD-10-CM

## 2018-10-05 DIAGNOSIS — E119 Type 2 diabetes mellitus without complications: Secondary | ICD-10-CM | POA: Diagnosis not present

## 2018-10-05 DIAGNOSIS — R296 Repeated falls: Secondary | ICD-10-CM

## 2018-10-05 NOTE — Chronic Care Management (AMB) (Signed)
Chronic Care Management   Initial Visit Note  10/05/2018 Name: Linda Mercado MRN: [id number] DOB: March 15, 1966  Referred by: Shelby Mattocks, PA-C Reason for referral : Chronic Care Management (INITIAL CCM TELEPHONE OUTREACH)   Linda Mercado is a 53 y.o. year old female who is a primary care patient of [place], [name], [place]. The CCM team was consulted for assistance with chronic disease management and care coordination needs.   Review of patient status, including review of consultants reports, relevant laboratory and other test results, and collaboration with appropriate care team members and the patient's provider was performed as part of comprehensive patient evaluation and provision of chronic care management services.    Objective:  Lab Results  Component Value Date   HGBA1C 6.5 (H) 09/17/2018   HGBA1C 5.7 (H) 03/20/2018   Lab Results  Component Value Date   CREATININE 0.85 09/17/2018   BP Readings from Last 3 Encounters:  09/17/18 122/74  08/13/18 129/63  07/16/18 130/84   I spoke with Linda Mercado today for her initial CCM introduction and enrollment for services.   Goals Addressed      Patient Stated   . "I've had a couple of recent falls" (pt-stated)       Current Barriers:  [name] Knowledge Deficits related to origin of Pain and loss of muscle control . Lacks caregiver support.   . Frequent falls secondary to pain and loss of muscle control of lower extremities  Nurse Case Manager Clinical Goal(s):  [name] Over the next 30 days, patient will not experience ED visits and or IP admissions related to falls or injury from falls.  . Over the next 30 days, patient will have a new patient appointment with a Neurologist, and or a movement disorder specialist.   Interventions:   Telephone outreach to patient for initial CCM introduction and enrollment  . Evaluation of current treatment plan related to fall prevention . Evaluation of past treatments and or  pertinent testing to evaluate root cause of impaired physical mobility . Reviewed and discussed recent lab results with patient  . Advised patient of recent Calcium Rx recommended and sent in to pharmacy . Assessed for DME needs (pt currently uses a 4 legged cane, a rollator and has a manual W/C) . Assessed for adequate caregiver support (pt's daughter lives with her but has limited availability due to she is working a full time job) . Collaboration with Daneen Schick, SW re: resource needs for caregiver assistance ASAP . Collaborated with Audery Amel, PA-C regarding patient's reported symptoms of uncontrolled pain, inability to participate in self care and impaired physical mobility resulting in multiple falls; requested an urgent referral for a Neurologist, specifically a movement disorder specialist  . Scheduled telephone follow up with patient   Patient Self Care Activities:  . Currently UNABLE TO independently participate in self care  Initial goal documentation      Patient stated   . "My bones hurt" "The pain pump is not helping"       Current Barriers:  [name] Knowledge Deficits related to unknown etiology for Pain . Lacks caregiver support  Nurse Case Manager Clinical Goal(s):  [name] Over the next 30 days, patient will verbalize understanding of plan for evaluation of and treatment for pain  Interventions:   Telephone outreach to patient for initial CCM introduction and enrollment  . Evaluation of current treatment plan related to pain management and patient's adherence to plan as established by provider . Evaluation of past treatments and or  pertinent testing to evaluate root cause of pain . Reviewed and discussed recent lab results with patient  . Advised patient of recent Calcium Rx recommended and sent in to pharmacy . Collaborated with Audery Amel, PA-C regarding patient's reported symptoms of uncontrolled pain, inability to participate in self care and impaired  physical mobility resulting in multiple falls . Scheduled telephone follow up with patient   Patient Self Care Activities:   Verbalizes understanding of information/education provided . Currently UNABLE TO independently perform self care   Initial goal documentation     Linda Mercado was given information about Chronic Care Management services today including:  1. CCM service includes personalized support from designated clinical staff supervised by her physician, including individualized plan of care and coordination with other care providers 2. 24/7 contact phone numbers for assistance for urgent and routine care needs. 3. Service will only be billed when office clinical staff spend 20 minutes or more in a month to coordinate care. 4. Only one practitioner may furnish and bill the service in a calendar month. 5. The patient may stop CCM services at any time (effective at the end of the month) by phone call to the office staff. 6. The patient will be responsible for cost sharing (co-pay) of up to 20% of the service fee (after annual deductible is met).  Patient agreed to services and verbal consent obtained.   Follow up with provider re: patient's reported symptoms and CCM recommendations   RN CM will follow up with patient by telephone over the next 5-7 days  Barb Merino, [facility] Care Management Coordinator [place] [facility]  Direct Phone: [phone]

## 2018-10-05 NOTE — Telephone Encounter (Signed)
Clonazepam refill 

## 2018-10-06 ENCOUNTER — Ambulatory Visit: Payer: Self-pay

## 2018-10-06 ENCOUNTER — Encounter: Payer: Self-pay | Admitting: Internal Medicine

## 2018-10-06 ENCOUNTER — Ambulatory Visit (INDEPENDENT_AMBULATORY_CARE_PROVIDER_SITE_OTHER): Payer: Medicare Other | Admitting: Internal Medicine

## 2018-10-06 ENCOUNTER — Other Ambulatory Visit (HOSPITAL_COMMUNITY)
Admission: RE | Admit: 2018-10-06 | Discharge: 2018-10-06 | Disposition: A | Discharge status: Home / Self Care | Payer: Medicare Other | Source: Ambulatory Visit | Attending: Internal Medicine | Admitting: Internal Medicine

## 2018-10-06 ENCOUNTER — Other Ambulatory Visit: Payer: Self-pay

## 2018-10-06 VITALS — BP 132/70 | HR 66 | Temp 97.5°F | Ht 59.4 in | Wt 159.0 lb

## 2018-10-06 DIAGNOSIS — Z1321 Encounter for screening for nutritional disorder: Secondary | ICD-10-CM | POA: Diagnosis not present

## 2018-10-06 DIAGNOSIS — M791 Myalgia, unspecified site: Secondary | ICD-10-CM | POA: Diagnosis not present

## 2018-10-06 DIAGNOSIS — L989 Disorder of the skin and subcutaneous tissue, unspecified: Secondary | ICD-10-CM | POA: Diagnosis not present

## 2018-10-06 DIAGNOSIS — E538 Deficiency of other specified B group vitamins: Secondary | ICD-10-CM

## 2018-10-06 DIAGNOSIS — I10 Essential (primary) hypertension: Secondary | ICD-10-CM

## 2018-10-06 DIAGNOSIS — R202 Paresthesia of skin: Secondary | ICD-10-CM | POA: Diagnosis not present

## 2018-10-06 DIAGNOSIS — M5416 Radiculopathy, lumbar region: Secondary | ICD-10-CM | POA: Diagnosis not present

## 2018-10-06 DIAGNOSIS — E559 Vitamin D deficiency, unspecified: Secondary | ICD-10-CM | POA: Diagnosis not present

## 2018-10-06 DIAGNOSIS — D485 Neoplasm of uncertain behavior of skin: Secondary | ICD-10-CM

## 2018-10-06 DIAGNOSIS — G8929 Other chronic pain: Secondary | ICD-10-CM | POA: Diagnosis not present

## 2018-10-06 DIAGNOSIS — M5412 Radiculopathy, cervical region: Secondary | ICD-10-CM | POA: Diagnosis not present

## 2018-10-06 DIAGNOSIS — R296 Repeated falls: Secondary | ICD-10-CM

## 2018-10-06 DIAGNOSIS — E119 Type 2 diabetes mellitus without complications: Secondary | ICD-10-CM

## 2018-10-06 MED ORDER — CALCIUM GLUCONATE 500 MG PO TABS: 1.0000 | ORAL_TABLET | Freq: Three times a day (TID) | ORAL | 2 refills | Status: DC

## 2018-10-06 NOTE — Telephone Encounter (Signed)
I do not prescribe this medication, herpsychiatrist does.

## 2018-10-06 NOTE — Patient Instructions (Signed)
Excision of Skin Lesions, Care After  This sheet gives you information about how to care for yourself after your procedure. Your health care provider may also give you more specific instructions. If you have problems or questions, contact your health care provider.  What can I expect after the procedure?  After your procedure, it is common to have pain or discomfort at the excision site.  Follow these instructions at home:  Excision care       · Follow instructions from your health care provider about how to take care of your excision site. Make sure you:  ? Wash your hands with soap and water before and after you change your bandage (dressing). If soap and water are not available, use hand sanitizer.  ? Change your dressing as told by your health care provider.  ? Leave stitches (sutures), skin glue, or adhesive strips in place. These skin closures may need to stay in place for 2 weeks or longer. If adhesive strip edges start to loosen and curl up, you may trim the loose edges. Do not remove adhesive strips completely unless your health care provider tells you to do that.  · Check the excision area every day for signs of infection. Watch for:  ? Redness, swelling, or pain.  ? Fluid or blood.  ? Warmth.  ? Pus or a bad smell.  · Keep the site clean, dry, and protected for at least 48 hours.  · For bleeding, apply gentle but firm pressure to the area using a folded towel for 20 minutes.  · Avoid high-impact exercise and activities until the sutures are removed or the area heals.  General instructions  · Take over-the-counter and prescription medicines only as told by your health care provider.  · Follow instructions from your health care provider about how to minimize scarring. Scarring should lessen over time.  · Avoid sun exposure until the area has healed. Use sunscreen to protect the area from the sun after it has healed.  · Keep all follow-up visits as told by your health care provider. This is  important.  Contact a health care provider if:  · You have redness, swelling, or pain around your excision site.  · You have fluid or blood coming from your excision site.  · Your excision site feels warm to the touch.  · You have pus or a bad smell coming from your excision site.  · You have a fever.  · You have pain that does not improve in 2-3 days after your procedure.  · You notice skin irregularities or changes in how you feel (sensation).  Summary  · This sheet of instructions provides you with information about caring for yourself after your procedure. Contact your health care provider if you have any problems or questions.  · Take over-the-counter and prescription medicines only as told by your health care provider.  · Change your dressing as told by your health care provider.  · Contact a health care provider if you have redness, swelling, pain, or other signs of infection around your excision site.  · Keep all follow-up visits as told by your health care provider. This is important.  This information is not intended to replace advice given to you by your health care provider. Make sure you discuss any questions you have with your health care provider.  Document Released: 11/22/2014 Document Revised: 01/14/2018 Document Reviewed: 01/14/2018  Elsevier Interactive Patient Education © 2019 [employer] Inc[employer]   

## 2018-10-06 NOTE — Chronic Care Management (AMB) (Signed)
Chronic Care Management    Clinical Social Work General Note  10/06/2018 Name: Linda Mercado MRN: [id number] DOB: 11/04/1965  Linda Mercado is a 53 y.o. year old female who is a primary care patient of [place], [name], [place]. The CCM was consulted to assist the patient with [employer].   Linda Mercado was given information about Chronic Care Management services today including:  1. CCM service includes personalized support from designated clinical staff supervised by her physician, including individualized plan of care and coordination with other care providers 2. 24/7 contact phone numbers for assistance for urgent and routine care needs. 3. Service will only be billed when office clinical staff spend 20 minutes or more in a month to coordinate care. 4. Only one practitioner may furnish and bill the service in a calendar month. 5. The patient may stop CCM services at any time (effective at the end of the month) by phone call to the office staff. 6. The patient will be responsible for cost sharing (co-pay) of up to 20% of the service fee (after annual deductible is met).  Patient agreed to services and verbal consent obtained.   Review of patient status, including review of consultants reports, relevant laboratory and other test results, and collaboration with appropriate care team members and the patient's provider was performed as part of comprehensive patient evaluation and provision of chronic care management services.    SDOH (Social Determinants of Health) screening performed today. See Care Plan Entry related to challenges with:  Transportation Food Insecurity , and the need for wheelchair ramp installation  Goals Addressed            This Visit's Progress     Patient Stated   . "I don't want to worry about food" (pt-stated)       Current Barriers:  . Financial constraints . Limited access to food . Lacks knowledge of community resource: Statistician  . Lacks knowledge of local food pantry sites  Clinical Social Work Clinical Goal(s):  [name] Over the next 30 days, patient will work with SW to address needs related to food insecurites  Interventions: . Patient interviewed and appropriate assessments performed . Provided patient with information about food and nutrition services . Discussed plans with patient for ongoing care management follow up and provided patient with direct contact information for care management team  . SW investigated household limit incomes for a household size of 3 . SW educated the patient on local food pantry locations for food assistance  Patient Self Care Activities:  . The patient is able to outreach family members when monetary assistance is needed . The patient is responsible for budgeting her family income regarding bills and food needs . The patient is unable to visit local food pantry sites but reports her god-daughter is able to assist  Initial goal documentation     . "I need more help" (pt-stated)       Current Barriers:  . Financial constraints . Limited social support . Lacks knowledge of: Medicaid benefits . Chronic pain effecting the patients ability to perform household chores  Clinical Social Work Clinical Goal(s):  [name] Over the next 30 days, client will work with SW to obtain a caregiver through [facility] PCS program  Interventions: . Patient interviewed and appropriate assessments performed . Provided patient with information about Medicaid benefits related to [facility] services . Discussed plans with patient for ongoing care management follow up and provided patient with direct contact information  for care management team  . Initiated PCS application to be completed by the patients primary care provider and submitted to [facility]  Patient Self Care Activities:  . Currently UNABLE TO independently perform ADL's and iADL's  Initial goal documentation     . "I want help  installing a ramp" (pt-stated)       Current Barriers:  . Financial constraints . Limited social support  . Physical pain  Clinical Social Work Clinical Goal(s):  [name] Over the next 45days, patient will work with SW to identify resources to assist with ramp installation  Interventions: . Patient interviewed and appropriate assessments performed . Discussed plans with patient for ongoing care management follow up and provided patient with direct contact information for care management team  . SW discussed plan to look into resource who may be able to assist with ramp installation  Patient Self Care Activities:  . The patient reports she currently has a metal ramp but is unable to install on her own  Initial goal documentation         Follow Up Plan: SW will follow up with patient by phone over the next week to continue assisting with resource needs       Nadara Eaton, CDP [facility] [place] [facility] Social Worker [phone]  Total time spent performing care coordination and/or care management activities with the patient by phone or face to face = 25 minutes.

## 2018-10-06 NOTE — Progress Notes (Signed)
Subjective:     Patient ID: Linda Mercado , female    DOB: 1966/02/25 , 53 y.o.   MRN: [id number]   Chief Complaint  Patient presents with  . Consult    skin lession-bone pain-insomnia-falling    HPI 1- Pt is here for skin lesion removal of R thigh. Has had it for years, but is getting larger and gets caught when she puts her pants.  2-Pt is here sent to me by Levada Dy from our Care team for evaluation of possible ataxia and frequent falls. Pt also tells me today, that her Pain Dr from [place] wants her admitted to figure out why she has all the pain she has. Her last lumbar spine MRI was at the end of last year and he wanted her to see a specialist, but wanted her to be referred by me to a specialist. All the other times I saw her she did not mention this to me.  She has all over pain and getting worse x 5 months. Her  Pain pump med was not increased but gabapentin was added  Last year, but pain got worse, so he d/c. He told her he would discuss with her about trial of other pain meds into her pump added to it for next month.  She has been in bed most days due to increased pain. Has hard time doing her house chores and her daughters have to help her a lot. She attributes her falls mostly to her legs giving away, but sometimes gets dizzy.   Her pain is located on lower back area which radiated to tail bone and radiated to her toes. She also feels pain on her lateral  ribs specially when she lays down. She also has pain on upper cervical region and pain radiated to her arms.  The last time she saw a neurosurgeon was 2017 since her back surgery.  3- She needs her calcium rx changed I sent last week, her insurance will not cover this.  Past Medical History:  Diagnosis Date  . Bradycardia   . Chronic back pain   . Diabetes mellitus without complication ([place])   . Hypertension   . Hypokalemia      Family History  Problem Relation Age of Onset  . Diabetes Father   . Hypertension Father   .  Diabetes Other   . Hypertension Other   . Stroke Neg Hx   . Cancer Neg Hx   . CAD Neg Hx      Current Outpatient Medications:  .  acetaminophen (TYLENOL) 500 MG tablet, Take 2 tablets (1,000 mg total) by mouth 3 (three) times daily at 8am, 2pm and bedtime., Disp: 30 tablet, Rfl: 0 .  albuterol (PROVENTIL HFA;VENTOLIN HFA) 108 (90 Base) MCG/ACT inhaler, Inhale 2 puffs into the lungs every 6 (six) hours as needed for wheezing or shortness of breath., Disp: 1 Inhaler, Rfl: 2 .  atenolol (TENORMIN) 100 MG tablet, TAKE 1 TABLET(100 MG) BY MOUTH DAILY, Disp: 30 tablet, Rfl: 2 .  atorvastatin (LIPITOR) 20 MG tablet, Take 1 tablet (20 mg total) by mouth at bedtime., Disp: 30 tablet, Rfl: 0 .  calcium gluconate 500 MG tablet, Take 1 tablet (500 mg total) by mouth 3 (three) times daily., Disp: 90 tablet, Rfl: 2 .  clonazePAM (KLONOPIN) 1 MG tablet, Take 1 mg by mouth 3 (three) times daily., Disp: , Rfl:  .  dexlansoprazole (DEXILANT) 60 MG capsule, Take 1 capsule (60 mg total) by mouth daily., Disp:  90 capsule, Rfl: 1 .  diclofenac sodium (VOLTAREN) 1 % GEL, Apply 4 g topically 4 (four) times daily as needed for pain., Disp: , Rfl:  .  divalproex (DEPAKOTE ER) 500 MG 24 hr tablet, Take 1,000 mg by mouth daily. , Disp: , Rfl: 1 .  docusate sodium (COLACE) 50 MG capsule, Take 50 mg by mouth at bedtime., Disp: , Rfl:  .  etodolac (LODINE) 400 MG tablet, Take 400 mg by mouth 2 (two) times daily., Disp: , Rfl:  .  fluticasone (FLOVENT HFA) 110 MCG/ACT inhaler, Inhale 2 puffs into the lungs every 12 (twelve) hours., Disp: , Rfl:  .  FREESTYLE LITE test strip, CHECK BLOOD SUGAR 3 TO 4 TIMES DAILY, Disp: 150 each, Rfl: 11 .  hydrochlorothiazide (MICROZIDE) 12.5 MG capsule, Take 1 capsule (12.5 mg total) by mouth daily., Disp: 90 capsule, Rfl: 0 .  hydrOXYzine (ATARAX/VISTARIL) 25 MG tablet, Take 25 mg by mouth 2 (two) times daily. , Disp: , Rfl:  .  losartan (COZAAR) 25 MG tablet, Take 1 tablet (25 mg total)  by mouth daily., Disp: 30 tablet, Rfl: 0 .  metFORMIN (GLUCOPHAGE) 500 MG tablet, Take 1 tablet (500 mg total) by mouth 2 (two) times daily with a meal., Disp: 180 tablet, Rfl: 0 .  NARCAN 4 MG/0.1ML LIQD nasal spray kit, Place 1 spray into the nose as directed., Disp: , Rfl: 0 .  Norgestimate-Ethinyl Estradiol Triphasic (TRI-ESTARYLLA) 0.18/0.215/0.25 MG-35 MCG tablet, Take 1 tablet by mouth daily., Disp: 1 Package, Rfl: 2 .  omeprazole (PRILOSEC) 20 MG capsule, Take 1 capsule (20 mg total) by mouth daily., Disp: 90 capsule, Rfl: o .  ondansetron (ZOFRAN) 4 MG tablet, Take 1 tablet (4 mg total) by mouth every 6 (six) hours., Disp: 30 tablet, Rfl: 0 .  oxyCODONE (OXY IR/ROXICODONE) 5 MG immediate release tablet, Take 5 mg by mouth 4 (four) times daily., Disp: , Rfl: 0 .  polyethylene glycol (MIRALAX / GLYCOLAX) packet, Take 17 g by mouth daily. For Bowels, Disp: 30 each, Rfl: 3 .  promethazine (PHENERGAN) 25 MG tablet, Take 1 tablet (25 mg total) by mouth every 6 (six) hours as needed for nausea or vomiting., Disp: 30 tablet, Rfl: 0 .  QUEtiapine (SEROQUEL XR) 400 MG 24 hr tablet, Take 400 mg by mouth 2 (two) times daily., Disp: , Rfl:  .  tiZANidine (ZANAFLEX) 4 MG tablet, Take 4 mg by mouth every 8 (eight) hours as needed (for nerve pain). , Disp: , Rfl:  .  traZODone (DESYREL) 100 MG tablet, Take 1 tablet (100 mg total) by mouth at bedtime., Disp: 30 tablet, Rfl: 3 .  triamcinolone cream (KENALOG) 0.1 %, Apply 1 application topically daily as needed (for itching). , Disp: , Rfl:  .  vortioxetine HBr (TRINTELLIX) 10 MG TABS tablet, Take 10 mg by mouth at bedtime., Disp: , Rfl:  .  VRAYLAR 6 MG CAPS, Take 1 capsule by mouth daily., Disp: , Rfl:    Allergies  Allergen Reactions  . Lisinopril Hives    Other reaction(s): anaphylaxis/angioedema  . Latex Hives  . Adhesive [Tape] Rash  . Sulfamethoxazole-Trimethoprim Diarrhea    Other reaction(s): gi distress     Review of Systems   Constitutional: Negative for fever.  HENT: Negative for congestion.   Respiratory: Negative for cough and shortness of breath.   Musculoskeletal: Positive for back pain, gait problem, myalgias, neck pain and neck stiffness. Negative for arthralgias and joint swelling.  Skin: Negative for rash.       +  skin lesion on R leg.   Neurological: Positive for dizziness, weakness and numbness.       Gets intermittent dizziness     Today's Vitals   10/06/18 1141  BP: 132/70  Pulse: 66  Temp: (!) 97.5 F (36.4 C)  TempSrc: Oral  SpO2: 97%  Weight: 159 lb (72.1 kg)  Height: 4' 11.4" (1.509 m)   Body mass index is 31.68 kg/m.   Objective:  Physical Exam Constitutional:      General: She is in acute distress.     Appearance: Normal appearance.     Comments: Appears in pain and sits very still. She is well kept, hair with brades, wearing dangling earring and is well dressed.   HENT:     Head: Normocephalic.     Right Ear: External ear normal.     Left Ear: External ear normal.     Nose: Nose normal.  Eyes:     General: No scleral icterus.    Extraocular Movements: Extraocular movements intact.     Conjunctiva/sclera: Conjunctivae normal.     Pupils: Pupils are equal, round, and reactive to light.  Neck:     Musculoskeletal: Neck supple. Muscular tenderness present. No neck rigidity.     Comments: She has  Light touch pain and vertebral tenderness. Her traps are tense and tender L>R Cardiovascular:     Rate and Rhythm: Normal rate and regular rhythm.     Pulses: Normal pulses.     Heart sounds: No murmur.  Pulmonary:     Effort: Pulmonary effort is normal.     Breath sounds: Normal breath sounds.  Abdominal:     General: Bowel sounds are normal.     Palpations: Abdomen is soft.     Comments: Has her pump on RLQ which area is tender. All her scargs are well healed from prior insurance.   Musculoskeletal: Normal range of motion.        General: Tenderness present.      Comments: Has soft tissue tenderness on all fibromyalgia points.   Lymphadenopathy:     Cervical: No cervical adenopathy.  Skin:    General: Skin is warm and dry.     Findings: No bruising.     Comments: Has a raised circular black keratotic lesion of 1/2 x 1/2 cm  Neurological:     Mental Status: She is alert and oriented to person, place, and time.     Gait: Gait abnormal.     Comments: Walks slowly and uses a cane. I was unable to obtain achillis reflexes, the rest were normal. Strength of lower legs 3/5 and symmetric. Upper extremity strength 4/5  Psychiatric:        Mood and Affect: Mood normal.        Behavior: Behavior normal.        Thought Content: Thought content normal.        Judgment: Judgment normal.    PROCEDURE: Skin cleansed with alcohol, topical infiltration of lidocaine 1 % wit.... epi., area cleansed with betadine. Sterile technique used. Sterile # 15 blade used and I shaved the skin lesion off. Mild bleeding noted for which I used silver nitrate to cauterize. I applied bacitracin ointment and dressing. Pt tolerated the procedure well.  Assessment And Plan:   1. Skin lesion- chronic.  - Dermatology pathology sent out.   2. Cervical radiculopathy- chronic. Refer to L  3. Lumbar radiculopathy- chronic.  4. Paresthesia- chronic. B12 ordered. Sent to neurologist.  5. Other chronic pain- chronic. Will continue FU with her pain MD.  6. Vitamin D deficiency- past history of this. If low could be contributing to her pain. I sent a different form of calcium for her.  - Vitamin D (25 hydroxy)  7. Vitamin B 12 deficiency-unknown level.  - Vitamin B12  8. Myalgia- chronic. I questioned if she has hyperalgesia from her pain meds in her pump, which pt was not aware this could happen. But recalls her dose was brought down and did not help her pain. Sent to neurologist.  - CK, total     Eeshan Verbrugge RODRIGUEZ-SOUTHWORTH, PA-C

## 2018-10-06 NOTE — Patient Instructions (Signed)
Social Worker Visit Information  Goals we discussed today:  Goals Addressed            This Visit's Progress     Patient Stated   . "I don't want to worry about food" (pt-stated)       Current Barriers:  . Financial constraints . Limited access to food . 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 Lacks knowledge of local food pantry sites  Clinical Social Work Clinical Goal(s):  [name] Over the next 30 days, patient will work with SW to address needs related to food insecurites  Interventions: . Patient interviewed and appropriate assessments performed . Provided patient with information about food and nutrition services . Discussed plans with patient for ongoing care management follow up and provided patient with direct contact information for care management team  . SW investigated household limit incomes for a household size of 3 . SW educated the patient on local food pantry locations for food assistance  Patient Self Care Activities:  . The patient is able to outreach family members when monetary assistance is needed . The patient is responsible for budgeting her family income regarding bills and food needs . The patient is unable to visit local food pantry sites but reports her god-daughter is able to assist  Initial goal documentation     . "I need more help" (pt-stated)       Current Barriers:  . Financial constraints . Limited social support . Lacks knowledge of: Medicaid benefits . Chronic pain effecting the patients ability to perform household chores  Clinical Social Work Clinical Goal(s):  [name] Over the next 30 days, client will work with SW to obtain a caregiver through [facility] PCS program  Interventions: . Patient interviewed and appropriate assessments performed . Provided patient with information about Medicaid benefits related to [facility] services . Discussed plans with patient for ongoing care management follow up and provided patient  with direct contact information for care management team  . Initiated PCS application to be completed by the patients primary care provider and submitted to [facility]  Patient Self Care Activities:  . Currently UNABLE TO independently perform ADL's and iADL's  Initial goal documentation     . "I want help installing a ramp" (pt-stated)       Current Barriers:  . Financial constraints . Limited social support  . Physical pain  Clinical Social Work Clinical Goal(s):  [name] Over the next 45days, patient will work with SW to identify resources to assist with ramp installation  Interventions: . Patient interviewed and appropriate assessments performed . Discussed plans with patient for ongoing care management follow up and provided patient with direct contact information for care management team  . SW discussed plan to look into resource who may be able to assist with ramp installation  Patient Self Care Activities:  . The patient reports she currently has a metal ramp but is unable to install on her own  Initial goal documentation         Materials provided: Verbal education about Medicaid benefits provided by phone  Linda Mercado was given information about Chronic Care Management services today including:  1. CCM service includes personalized support from designated clinical staff supervised by her physician, including individualized plan of care and coordination with other care providers 2. 24/7 contact phone numbers for assistance for urgent and routine care needs. 3. Service will only be billed when office clinical staff spend 20 minutes or more  in a month to coordinate care. 4. Only one practitioner may furnish and bill the service in a calendar month. 5. The patient may stop CCM services at any time (effective at the end of the month) by phone call to the office staff. 6. The patient will be responsible for cost sharing (co-pay) of up to 20% of the service fee (after  annual deductible is met).  Patient agreed to services and verbal consent obtained.   The patient verbalized understanding of instructions provided today and declined a print copy of patient instruction materials.   Follow up plan: SW will follow up with patient by phone over the next week   Daneen Schick, BSW, CDP [facility] / [place] Management Social Worker [phone]

## 2018-10-06 NOTE — Patient Instructions (Addendum)
Visit Information  Goals Addressed      Patient Stated   . "I've had a couple of recent falls" (pt-stated)       Current Barriers:  [name] Knowledge Deficits related to origin of Pain and loss of muscle control . Lacks caregiver support.   . Frequent falls secondary to pain and loss of muscle control of lower extremities  Nurse Case Manager Clinical Goal(s):  [name] Over the next 30 days, patient will not experience ED visits and or IP admissions related to falls or injury from falls.  . Over the next 30 days, patient will have a new patient appointment with a Neurologist, and or a movement disorder specialist.   Interventions:   Telephone outreach to patient for initial CCM introduction and enrollment  . Evaluation of current treatment plan related to fall prevention . Evaluation of past treatments and or pertinent testing to evaluate root cause of impaired physical mobility . Reviewed and discussed recent lab results with patient  . Advised patient of recent Calcium Rx recommended and sent in to pharmacy . Assessed for DME needs (pt currently uses a 4 legged cane, a rollator and has a manual W/C) . Assessed for adequate caregiver support (pt's daughter lives with her but has limited availability due to she is working a full time job) . Collaboration with Daneen Schick, SW re: resource needs for caregiver assistance ASAP . Collaborated with Audery Amel, PA-C regarding patient's reported symptoms of uncontrolled pain, inability to participate in self care and impaired physical mobility resulting in multiple falls; requested an urgent referral for a Neurologist, specifically a movement disorder specialist  . Scheduled telephone follow up with patient   Patient Self Care Activities:  . Currently UNABLE TO independently participate in self care  Initial goal documentation     . "My bones hurt" "The pain pump is not helping" (pt-stated)       Current Barriers:  [name] Knowledge Deficits  related to unknown etiology for Pain . Lacks caregiver support  Nurse Case Manager Clinical Goal(s):  [name] Over the next 30 days, patient will verbalize understanding of plan for evaluation of and treatment for pain  Interventions:   Telephone outreach to patient for initial CCM introduction and enrollment  . Evaluation of current treatment plan related to pain management and patient's adherence to plan as established by provider . Evaluation of past treatments and or pertinent testing to evaluate root cause of pain . Reviewed and discussed recent lab results with patient  . Advised patient of recent Calcium Rx recommended and sent in to pharmacy . Collaborated with Audery Amel, PA-C regarding patient's reported symptoms of uncontrolled pain, inability to participate in self care and impaired physical mobility resulting in multiple falls . Scheduled telephone follow up with patient   Patient Self Care Activities:   Verbalizes understanding of information/education provided . Currently UNABLE TO independently perform self care   Initial goal documentation     Linda Mercado was given information about Chronic Care Management services today including:  1. CCM service includes personalized support from designated clinical staff supervised by her physician, including individualized plan of care and coordination with other care providers 2. 24/7 contact phone numbers for assistance for urgent and routine care needs. 3. Service will only be billed when office clinical staff spend 20 minutes or more in a month to coordinate care. 4. Only one practitioner may furnish and bill the service in a calendar month. 5. The patient may stop CCM services  at any time (effective at the end of the month) by phone call to the office staff. 6. The patient will be responsible for cost sharing (co-pay) of up to 20% of the service fee (after annual deductible is met).  Patient agreed to services and verbal  consent obtained.   The patient verbalized understanding of instructions provided today and declined a print copy of patient instruction materials.   Follow up with provider re: patient's reported symptoms and CCM recommendations  RN CM will follow up with patient by telephone over the next 5-7 days  Linda Mercado, [facility] Care Management Coordinator [place] [facility]  Direct Phone: [phone]

## 2018-10-07 ENCOUNTER — Other Ambulatory Visit: Payer: Self-pay | Admitting: Internal Medicine

## 2018-10-07 LAB — VITAMIN D 25 HYDROXY (VIT D DEFICIENCY, FRACTURES): Vit D, 25-Hydroxy: 26.7 ng/mL — ABNORMAL LOW (ref 30.0–100.0)

## 2018-10-07 LAB — CK: Total CK: 56 U/L (ref 24–173)

## 2018-10-07 LAB — VITAMIN B12: Vitamin B-12: 586 pg/mL (ref 232–1245)

## 2018-10-07 MED ORDER — B-12 500 MCG SL SUBL: SUBLINGUAL_TABLET | SUBLINGUAL | 1 refills | Status: DC

## 2018-10-07 MED ORDER — VITAMIN D (ERGOCALCIFEROL) 1.25 MG (50000 UNIT) PO CAPS: ORAL_CAPSULE | ORAL | 1 refills | Status: DC

## 2018-10-08 ENCOUNTER — Telehealth: Payer: Self-pay

## 2018-10-08 ENCOUNTER — Other Ambulatory Visit: Payer: Self-pay | Admitting: Internal Medicine

## 2018-10-08 ENCOUNTER — Ambulatory Visit: Payer: Self-pay

## 2018-10-08 DIAGNOSIS — R296 Repeated falls: Secondary | ICD-10-CM

## 2018-10-08 DIAGNOSIS — E119 Type 2 diabetes mellitus without complications: Secondary | ICD-10-CM

## 2018-10-08 DIAGNOSIS — M5416 Radiculopathy, lumbar region: Secondary | ICD-10-CM

## 2018-10-08 DIAGNOSIS — I10 Essential (primary) hypertension: Secondary | ICD-10-CM

## 2018-10-08 MED ORDER — CALCIUM GLUCONATE 500 MG PO TABS: ORAL_TABLET | ORAL | 0 refills | Status: DC

## 2018-10-08 NOTE — Addendum Note (Signed)
Addended by: Nicki Guadalajara on: 10/08/2018 08:32 AM   Modules accepted: Orders

## 2018-10-08 NOTE — Chronic Care Management (AMB) (Signed)
  Chronic Care Management   Follow Up Note   10/08/2018 Name: Linda Mercado MRN: [id number] DOB: [birth year]  Referred by: Shelby Mattocks, PA-C Reason for referral : Chronic Care Management (INTERNAL COLLABORATION)   Linda Mercado is a 53 y.o. year old female who is a primary care patient of [place], [name], [place]. The CCM team was consulted for assistance with chronic disease management and care coordination needs.    Collaboration from provider, Audery Amel, PA-C via in [employer].   Goals Addressed      Patient Stated   . "My bones hurt" "The pain pump is not helping" (pt-stated)       Current Barriers:  [name] Knowledge Deficits related to unknown etiology for Pain . Lacks caregiver support  Nurse Case Manager Clinical Goal(s):  [name] Over the next 30 days, patient will verbalize understanding of plan for evaluation of and treatment for pain  Interventions:  . Received response from provider Audery Amel, PA-C with the following update re: patient's OV; . Referral to Neurologist has been sent and approved . Labs checked including a Vitamin B12 and a Vitamin D level indicated abnormal with low values; Rx for Vitamin D and B12 were sent to patient's pharmacy and the patient has been informed . Patient may be experiencing Hyperalgesia secondary to Opiates . Scheduled telephone follow up with patient   Patient Self Care Activities:   Verbalizes understanding of information/education provided . Currently UNABLE TO independently perform self care   Initial goal documentation      The CM team will reach out to the patient again over the next 5-7 days.  Next PCP appointment scheduled for: 12/22/18 @11 :00 AM   Barb Merino, RN,CCM Care Management Coordinator [place] [facility]  Direct Phone: [phone]

## 2018-10-08 NOTE — Progress Notes (Unsigned)
Please tell her that her calcium is a little high and to only take it 5 times a week.

## 2018-10-08 NOTE — Telephone Encounter (Signed)
Left message for pt to call back   Please tell her that her calcium is a little high and to only take it 5 times a week.

## 2018-10-09 ENCOUNTER — Ambulatory Visit: Payer: Self-pay

## 2018-10-09 DIAGNOSIS — R296 Repeated falls: Secondary | ICD-10-CM

## 2018-10-09 DIAGNOSIS — M5416 Radiculopathy, lumbar region: Secondary | ICD-10-CM

## 2018-10-09 DIAGNOSIS — E119 Type 2 diabetes mellitus without complications: Secondary | ICD-10-CM

## 2018-10-09 NOTE — Patient Instructions (Signed)
Social Worker Visit Information  Goals we discussed today:  Goals Addressed            This Visit's Progress     Patient Stated   . "I don't want to worry about food" (pt-stated)   On track    Current Barriers:  . Financial constraints . Limited access to food . 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 Lacks knowledge of local food pantry sites  Clinical Social Work Clinical Goal(s):  [name] Over the next 30 days, patient will work with SW to address needs related to food insecurites  Interventions: . SW educated the patient on [place] and Nutrition benefits and income eligibility requirements . SW provided the patient with information on how to apply for food and nutrition services . SW mailed a food and nutrition services application to the patients home address at the request of the patient . SW mailed the patient "[employer] [place]" for information on loca food pantry sites  Patient Self Care Activities:  . The patient is able to outreach family members when monetary assistance is needed . The patient is responsible for budgeting her family income regarding bills and food needs . The patient is unable to visit local food pantry sites but reports her god-daughter is able to assist  Please see past updates related to this goal by clicking on the "Past Updates" button in the selected goal      . "I need more help" (pt-stated)   On track    Current Barriers:  . Financial constraints . Limited social support . Lacks knowledge of: Medicaid benefits . Chronic pain effecting the patients ability to perform household chores  Clinical Social Work Clinical Goal(s):  [name] Over the next 30 days, client will work with SW to obtain a caregiver through [facility] PCS program  Interventions: . SW faxed completed PCS application to [facility]  . SW educated the patient on the [facility] application process . SW encouraged the patient to answer the phone when  [facility] contacts her to arrange an appointment  Patient Self Care Activities:  . Currently UNABLE TO independently perform ADL's and iADL's  . The patient is able to verbalize understanding of [facility] application process  Please see past updates related to this goal by clicking on the "Past Updates" button in the selected goal      . "I want help installing a ramp" (pt-stated)   On track    Current Barriers:  . Financial constraints . Limited social support  . Physical pain  Clinical Social Work Clinical Goal(s):  [name] Over the next 45 days, patient will work with SW to identify resources to assist with ramp installation  Interventions: . SW contacted [place] with [employer] to inquire if their program could assist with ramp installation . SW educated the patient on this community resource and obtained consent to place referral . SW placed a referral to [employer] via [place] for assistance  Patient Self Care Activities:  . The patient reports she currently has a metal ramp but is unable to install on her own  Please see past updates related to this goal by clicking on the "Past Updates" button in the selected goal          Materials Provided: Yes: Food and Nutrition Services Application and "The [place]" provided via mail  Follow Up Plan: SW will follow up with  patient by phone over the next two weeks.   Daneen Schick, BSW, CDP [facility] / [facility] Care Management Social Worker [phone]

## 2018-10-09 NOTE — Progress Notes (Signed)
Erroneous Encounter

## 2018-10-09 NOTE — Chronic Care Management (AMB) (Signed)
Chronic Care Management    Clinical Social Work Follow Up Note  10/09/2018 Name: Linda Mercado MRN: [id number] DOB: [birth year]  Linda Mercado is a 53 y.o. year old female who is a primary care patient of [place], [name], [place]. The CCM team was consulted for assistance with [employer] and [employer].   Review of patient status, including review of consultants reports, other relevant assessments, and collaboration with appropriate care team members and the patient's provider was performed as part of comprehensive patient evaluation and provision of chronic care management services.     Goals Addressed            This Visit's Progress     Patient Stated   . "I don't want to worry about food" (pt-stated)   On track    Current Barriers:  . Financial constraints . Limited access to food . 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 Lacks knowledge of local food pantry sites  Clinical Social Work Clinical Goal(s):  [name] Over the next 30 days, patient will work with SW to address needs related to food insecurites  Interventions: . SW educated the patient on [place] and Nutrition benefits and income eligibility requirements . SW provided the patient with information on how to apply for food and nutrition services . SW mailed a food and nutrition services application to the patients home address at the request of the patient . SW mailed the patient "The [place]" for information on loca food pantry sites  Patient Self Care Activities:  . The patient is able to outreach family members when monetary assistance is needed . The patient is responsible for budgeting her family income regarding bills and food needs . The patient is unable to visit local food pantry sites but reports her god-daughter is able to assist  Please see past updates related to this goal by clicking on the "Past Updates" button in the selected goal      . "I  need more help" (pt-stated)   On track    Current Barriers:  . Financial constraints . Limited social support . Lacks knowledge of: Medicaid benefits . Chronic pain effecting the patients ability to perform household chores  Clinical Social Work Clinical Goal(s):  [name] Over the next 30 days, client will work with SW to obtain a caregiver through [facility] PCS program  Interventions: . SW faxed completed PCS application to [facility]  . SW educated the patient on the [facility] application process . SW encouraged the patient to answer the phone when [facility] contacts her to arrange an appointment  Patient Self Care Activities:  . Currently UNABLE TO independently perform ADL's and iADL's  . The patient is able to verbalize understanding of [facility] application process  Please see past updates related to this goal by clicking on the "Past Updates" button in the selected goal      . "I want help installing a ramp" (pt-stated)   On track    Current Barriers:  . Financial constraints . Limited social support  . Physical pain  Clinical Social Work Clinical Goal(s):  [name] Over the next 45 days, patient will work with SW to identify resources to assist with ramp installation  Interventions: . SW contacted [place] with [employer] to inquire if their program could assist with ramp installation . SW educated the patient on this community resource and obtained consent to place referral .  SW placed a referral to [employer] via [place] for assistance  Patient Self Care Activities:  . The patient reports she currently has a metal ramp but is unable to install on her own  Please see past updates related to this goal by clicking on the "Past Updates" button in the selected goal          Follow Up Plan: SW will follow up with patient by phone over the next two weeks   Daneen Schick, BSW, CDP [facility] / [place]  Worker [phone]  Total time spent performing care coordination and/or care management activities with the patient by phone or face to face = 35 minutes.

## 2018-10-12 ENCOUNTER — Telehealth: Payer: Medicare Other

## 2018-10-14 ENCOUNTER — Other Ambulatory Visit: Payer: Self-pay

## 2018-10-14 MED ORDER — METFORMIN HCL 500 MG PO TABS: 500.0000 mg | ORAL_TABLET | Freq: Two times a day (BID) | ORAL | 0 refills | Status: DC

## 2018-10-14 MED ORDER — ATENOLOL 100 MG PO TABS: ORAL_TABLET | ORAL | 2 refills | Status: DC

## 2018-10-16 ENCOUNTER — Other Ambulatory Visit: Payer: Self-pay

## 2018-10-16 ENCOUNTER — Telehealth: Payer: Medicare Other

## 2018-10-16 ENCOUNTER — Ambulatory Visit: Payer: Self-pay

## 2018-10-16 ENCOUNTER — Ambulatory Visit: Payer: Medicare Other

## 2018-10-16 DIAGNOSIS — M5412 Radiculopathy, cervical region: Secondary | ICD-10-CM

## 2018-10-16 DIAGNOSIS — E119 Type 2 diabetes mellitus without complications: Secondary | ICD-10-CM

## 2018-10-16 DIAGNOSIS — M5416 Radiculopathy, lumbar region: Secondary | ICD-10-CM

## 2018-10-16 DIAGNOSIS — R296 Repeated falls: Secondary | ICD-10-CM

## 2018-10-16 DIAGNOSIS — I10 Essential (primary) hypertension: Secondary | ICD-10-CM

## 2018-10-16 NOTE — Patient Instructions (Signed)
Social Worker Visit Information  Goals we discussed today:  Goals Addressed            This Visit's Progress     Patient Stated   . "I need more help" (pt-stated)   Not on track    Current Barriers:  . Financial constraints . Limited social support . Lacks knowledge of: Medicaid benefits . Chronic pain effecting the patients ability to perform household chores  Clinical Social Work Clinical Goal(s):  [name] Over the next 30 days, client will work with SW to obtain a caregiver through [facility] PCS program  Interventions: . Contacted the patient to inquire if she had been contacted by [facility] . [facility] to inquire status of patients application- SW informed the patient does not have PCS coverage under her Medicaid benefit  Patient Self Care Activities:  . Currently UNABLE TO independently perform ADL's and iADL's    Please see past updates related to this goal by clicking on the "Past Updates" button in the selected goal      . "I want help installing a ramp" (pt-stated)       Current Barriers:  . Financial constraints . Limited social support  . Physical pain  Clinical Social Work Clinical Goal(s):  [name] Over the next 45 days, patient will work with SW to identify resources to assist with ramp installation  Interventions: . SW contacted [employer] with [employer] to inquire status of patient referral  . Contacted the patient to inform her Mrs. Hopkins has placed an unsuccessful call to the patient and is awaiting a return call . Encouraged the patient to return call to [employer]  Patient Self Care Activities:  . The patient reports she currently has a metal ramp but is unable to install on her own  Please see past updates related to this goal by clicking on the "Past Updates" button in the selected goal          Materials Provided: Verbal education about community resources provided by  phone  Follow Up Plan: SW will follow up with patient by phone over the next 7 days.   Daneen Schick, BSW, CDP [facility] / [facility] Care Management Social Worker [phone]

## 2018-10-16 NOTE — Chronic Care Management (AMB) (Signed)
Chronic Care Management   Follow Up Note   10/16/2018 Name: Linda Mercado MRN: [id number] DOB: 06-Jul-1966  Referred by: Linda Mattocks, PA-C Reason for referral : Chronic Care Management (CCM Follow up call )   Linda Mercado is a 53 y.o. year old female who is a primary care patient of [place], Linda Mercado, [place]. The CCM team was consulted for assistance with chronic disease management and care coordination needs.    Review of patient status, including review of consultants reports, relevant laboratory and other test results, and collaboration with appropriate care team members and the patient's provider was performed as part of comprehensive patient evaluation and provision of chronic care management services.   I spoke to Linda Mercado by telephone today.   Goals Addressed      Patient Stated   . "I've had a couple of recent falls" (pt-stated)   Not on track    Current Barriers:  [name] Knowledge Deficits related to origin of Pain and loss of muscle control . Lacks caregiver support.   . Frequent falls secondary to pain and loss of muscle control of lower extremities  Nurse Case Manager Clinical Goal(s):  [name] Over the next 30 days, patient will not experience ED visits and or IP admissions related to falls or injury from falls.  . Over the next 60 days, patient will have a new patient appointment with a Neurologist, and or a movement disorder specialist. (target goal date received due to COVID-19 delays)  Interventions:   Telephone follow up with patient today  Assessed for falls (pt had 1 additional fall in her home since last call, fell with [place] of her walker while walking to bathroom alone, family was home and lifted her off the floor, pt denies injury) . Encouraged patient to always call for help from her family when needing to ambulate to restroom . Encouraged patient to use her walker at all times and to always keep home clutter free . Assessed for other DME  needs (pt declines needing a hospital bed and or power W/C due to states home does not flow well and she doesn't want to be in a hospital bed) . Assessed for DME needs (pt currently uses a 4 legged cane, a rollator and has a manual W/C) . Scheduled telephone follow up with patient   Patient Self Care Activities:  . Currently UNABLE TO independently participate in self care  Please see past updates related to this goal by clicking on the "Past Updates" button in the selected goal     . "My bones hurt" "The pain pump is not helping" (pt-stated)   Not on track    Current Barriers:  [name] Knowledge Deficits related to unknown etiology for Pain . Lacks caregiver support  Nurse Case Manager Clinical Goal(s):  [name] Over the next 60 days, patient will verbalize understanding of plan for evaluation of and treatment for pain. (Revised goal target date due to COVID-19 delays in care)  Interventions:  . Telephone follow up with patient . Assessed for new or worsening pain (pt reports no change, rates pain as severe from neck down to toes, states, "it hurts to move, to roll over and to try to lift my body) . Educated patient on potential Hyperalgesia secondary to overuse or extended use of Opiates (pt will have phone visit with Pain doctor, Linda Mercado next week, MD has mentioned switching Oxycodone to Dilaudid and is researching new intrathecal for pain pump) . Instructed patient Neuro referral  to [facility] Neurology was approved but new patient visit may be delayed due to COVID-19 . Reviewed recent labs with patient, instructed on Vitamin D, Vitamin B12 deficiency's and recommendations per Linda Mercado (pt has started Calcium and Vitamin D but was not aware of Vitamin B12, her daughter will pick up from the pharmacy) . Instructed patient on importance of adhering to COVID-19 safety restrictions (she and her family are diligently adhering) . In basket message sent to Linda Amel, PA-C regarding  request for additional labs once a return OV is safe from COVID-19 for TSH level, RA level, ANA and Sed Rate be checked  Scheduled telephone follow up with patient   Patient Self Care Activities:   Verbalizes understanding of information/education provided . Currently UNABLE TO independently perform self care   Please see past updates related to this goal by clicking on the "Past Updates" button in the selected goal       The CM team will reach out to the patient again over the next 7-14 days.    Linda Merino, RN,CCM Care Management Coordinator [place] [facility]  Direct Phone: [phone]

## 2018-10-16 NOTE — Patient Instructions (Signed)
Visit Information  Goals Addressed      Patient Stated   . "I've had a couple of recent falls" (pt-stated)   Not on track    Current Barriers:  [name] Knowledge Deficits related to origin of Pain and loss of muscle control . Lacks caregiver support.   . Frequent falls secondary to pain and loss of muscle control of lower extremities  Nurse Case Manager Clinical Goal(s):  [name] Over the next 30 days, patient will not experience ED visits and or IP admissions related to falls or injury from falls.  . Over the next 60 days, patient will have a new patient appointment with a Neurologist, and or a movement disorder specialist. (target goal date received due to COVID-19 delays)  Interventions:   Telephone follow up with patient today  Assessed for falls (pt had 1 additional fall in her home since last call, fell with [place] of her walker while walking to bathroom alone, family was home and lifted her off the floor, pt denies injury) . Encouraged patient to always call for help from her family when needing to ambulate to restroom . Encouraged patient to use her walker at all times and to always keep home clutter free . Assessed for other DME needs (pt declines needing a hospital bed and or power W/C due to states home does not flow well and she doesn't want to be in a hospital bed) . Assessed for DME needs (pt currently uses a 4 legged cane, a rollator and has a manual W/C) . Scheduled telephone follow up with patient   Patient Self Care Activities:  . Currently UNABLE TO independently participate in self care  Please see past updates related to this goal by clicking on the "Past Updates" button in the selected goal       . "My bones hurt" "The pain pump is not helping" (pt-stated)   Not on track    Current Barriers:  [name] Knowledge Deficits related to unknown etiology for Pain . Lacks caregiver support  Nurse Case Manager Clinical Goal(s):  [name] Over the next 60 days, patient will verbalize  understanding of plan for evaluation of and treatment for pain. (Revised goal target date due to COVID-19 delays in care)  Interventions:  . Telephone follow up with patient . Assessed for new or worsening pain (pt reports no change, rates pain as severe from neck down to toes, states, "it hurts to move, to roll over and to try to lift my body) . Educated patient on potential Hyperalgesia secondary to overuse or extended use of Opiates (pt will have phone visit with Pain doctor, Dr. Debarah Crape next week, MD has mentioned switching Oxycodone to Dilaudid and is researching new intrathecal for pain pump) . Instructed patient Neuro referral to [facility] Neurology was approved but new patient visit may be delayed due to COVID-19 . Reviewed recent labs with patient, instructed on Vitamin D, Vitamin B12 deficiency's and recommendations per Sandrea Matte (pt has started Calcium and Vitamin D but was not aware of Vitamin B12, her daughter will pick up from the pharmacy) . Instructed patient on importance of adhering to COVID-19 safety restrictions (she and her family are diligently adhering) . In basket message sent to Audery Amel, PA-C regarding request for additional labs once a return OV is safe from COVID-19 for TSH level, RA level, ANA and Sed Rate be checked  Scheduled telephone follow up with patient   Patient Self Care Activities:   Verbalizes understanding of information/education  provided . Currently UNABLE TO independently perform self care   Please see past updates related to this goal by clicking on the "Past Updates" button in the selected goal      The patient verbalized understanding of instructions provided today and declined a print copy of patient instruction materials.   The CM team will reach out to the patient again over the next 7-14 days.   Barb Merino, RN,CCM Care Management Coordinator [place] [facility]  Direct Phone:  [phone]

## 2018-10-16 NOTE — Chronic Care Management (AMB) (Signed)
  Chronic Care Management    Clinical Social Work Follow Up Note  10/16/2018 Name: Linda Mercado MRN: [id number] DOB: 1966/05/22  Linda Mercado is a 53 y.o. year old female who is a primary care patient of [place], [name], [place]. The CCM team was consulted for assistance with chronic disease management and care coordination.  Review of patient status, including review of consultants reports, other relevant assessments, and collaboration with appropriate care team members and the patient's provider was performed as part of comprehensive patient evaluation and provision of chronic care management services.    I placed a collaborative follow up call to the patient with CCM RNCM on today's date to follow up on goal progression.   Goals Addressed            This Visit's Progress     Patient Stated   . "I need more help" (pt-stated)   Not on track    Current Barriers:  . Financial constraints . Limited social support . Lacks knowledge of: Medicaid benefits . Chronic pain effecting the patients ability to perform household chores  Clinical Social Work Clinical Goal(s):  [name] Over the next 30 days, client will work with SW to obtain a caregiver through [facility] PCS program  Interventions: . Contacted the patient to inquire if she had been contacted by [facility] to inquire status of patients application- SW informed the patient does not have PCS coverage under her Medicaid benefit  Patient Self Care Activities:  . Currently UNABLE TO independently perform ADL's and iADL's    Please see past updates related to this goal by clicking on the "Past Updates" button in the selected goal      . "I want help installing a ramp" (pt-stated)       Current Barriers:  . Financial constraints . Limited social support  . Physical pain  Clinical Social Work Clinical Goal(s):  [name] Over the next 45 days, patient will work with SW to identify  resources to assist with ramp installation  Interventions: . SW contacted [employer] with [employer] to inquire status of patient referral  . Contacted the patient to inform her Mrs. Hopkins has placed an unsuccessful call to the patient and is awaiting a return call . Encouraged the patient to return call to [employer]  Patient Self Care Activities:  . The patient reports she currently has a metal ramp but is unable to install on her own  Please see past updates related to this goal by clicking on the "Past Updates" button in the selected goal          Follow Up Plan: SW will follow up with patient by phone over the next 7 days.   Daneen Schick, BSW, CDP [facility] Care Management Social Worker [phone]  Total time spent performing care coordination and/or care management activities with the patient by phone or face to face = 15 minutes.

## 2018-10-19 ENCOUNTER — Other Ambulatory Visit: Payer: Self-pay

## 2018-10-19 ENCOUNTER — Ambulatory Visit: Payer: Self-pay

## 2018-10-19 ENCOUNTER — Telehealth: Payer: Medicare Other

## 2018-10-19 DIAGNOSIS — E119 Type 2 diabetes mellitus without complications: Secondary | ICD-10-CM

## 2018-10-19 DIAGNOSIS — I10 Essential (primary) hypertension: Secondary | ICD-10-CM

## 2018-10-19 DIAGNOSIS — R296 Repeated falls: Secondary | ICD-10-CM

## 2018-10-19 DIAGNOSIS — M5416 Radiculopathy, lumbar region: Secondary | ICD-10-CM

## 2018-10-19 NOTE — Chronic Care Management (AMB) (Signed)
  Chronic Care Management    Clinical Social Work Follow Up Note  10/19/2018 Name: Linda Mercado MRN: [id number] DOB: 01-Mar-1966  Linda Mercado is a 53 y.o. year old female who is a primary care patient of [place], [name], [place]. The CCM team was consulted for assistance with chronic disease management and care coordination.  Review of patient status, including review of consultants reports, other relevant assessments, and collaboration with appropriate care team members and the patient's provider was performed as part of comprehensive patient evaluation and provision of chronic care management services.    I attempted to contact the patient on today's date. HIPAA compliant voice message requesting a return call.   Goals Addressed            This Visit's Progress     Patient Stated   . "I need more help" (pt-stated)       Current Barriers:  . Financial constraints . Limited social support . Lacks knowledge of: Medicaid benefits . Chronic pain effecting the patients ability to perform household chores  Clinical Social Work Clinical Goal(s):  [name] Over the next 30 days, client will work with SW to obtain a caregiver through [facility] PCS program  Interventions: . Attempted to contact the patient to discuss findings of patients Medicaid  Benefit coverage . SW left a voice message requesting a return call  Patient Self Care Activities:  . Currently UNABLE TO independently perform ADL's and iADL's    Please see past updates related to this goal by clicking on the "Past Updates" button in the selected goal      . "I want help installing a ramp" (pt-stated)   Not on track    Current Barriers:  . Financial constraints . Limited social support  . Physical pain  Clinical Social Work Clinical Goal(s):  [name] Over the next 45 days, patient will work with SW to identify resources to assist with ramp installation  Interventions:  SW received correspondance from [employer] the patient has not yet provided income information in order to move forward with referral process  SW attempted to contact the patient to discuss needed information  SW left a HIPAA complaint voice message requesting a return call  Patient Self Care Activities:  . The patient reports she currently has a metal ramp but is unable to install on her own  Please see past updates related to this goal by clicking on the "Past Updates" button in the selected goal          Follow Up Plan: SW will follow up with patient by phone over the next week.  Daneen Schick, BSW, CDP [facility] Care Management Social Worker [phone]  Total time spent performing care coordination and/or care management activities with the patient by phone or face to face = 5 minutes.

## 2018-10-19 NOTE — Chronic Care Management (AMB) (Signed)
  Chronic Care Management   Follow Up Note   10/19/2018 Name: Linda Mercado MRN: [id number] DOB: [birth year]  Referred by: Shelby Mattocks, PA-C Reason for referral : Chronic Care Management (Provider Collaboration )   Linda Mercado is a 53 y.o. year old female who is a primary care patient of [place], [name], [place]. The CCM team was consulted for assistance with chronic disease management and care coordination needs.    Review of patient status, including review of consultants reports, relevant laboratory and other test results, and collaboration with appropriate care team members and the patient's provider was performed as part of comprehensive patient evaluation and provision of chronic care management services.    I spoke with Lexine Baton from [facility] today regarding patient' new patient referral.   Goals Addressed      Patient Stated   . "I've had a couple of recent falls" (pt-stated)       Current Barriers:  [name] Knowledge Deficits related to origin of Pain and loss of muscle control . Lacks caregiver support.   . Frequent falls secondary to pain and loss of muscle control of lower extremities  Nurse Case Manager Clinical Goal(s):  [name] Over the next 30 days, patient will not experience ED visits and or IP admissions related to falls or injury from falls.  . Over the next 60 days, patient will have a new patient appointment with a Neurologist, and or a movement disorder specialist. (target goal date received due to COVID-19 delays)  Interventions:  . Collaboration with Lexine Baton from [facility] [phone]; confirmed Neuro referral was received; confirmed office has attempted patient once w/o success; confirmed office will make 2 additional attempts to reach patient to offer a video screening; confirmed visits are being pushed out to June due to COVID-19   Scheduled telephone follow up with patient   Patient Self Care  Activities:  . Currently UNABLE TO independently participate in self care  Please see past updates related to this goal by clicking on the "Past Updates" button in the selected goal       The CM team will reach out to the patient again over the next 3-5 days.   Barb Merino, RN,CCM Care Management Coordinator [place] [facility]  Direct Phone: [phone]

## 2018-10-22 ENCOUNTER — Other Ambulatory Visit: Payer: Self-pay | Admitting: Internal Medicine

## 2018-10-22 ENCOUNTER — Ambulatory Visit: Payer: Medicare Other

## 2018-10-22 ENCOUNTER — Other Ambulatory Visit: Payer: Self-pay

## 2018-10-22 ENCOUNTER — Telehealth: Payer: Medicare Other

## 2018-10-22 ENCOUNTER — Ambulatory Visit (INDEPENDENT_AMBULATORY_CARE_PROVIDER_SITE_OTHER): Payer: Medicare Other

## 2018-10-22 DIAGNOSIS — M5416 Radiculopathy, lumbar region: Secondary | ICD-10-CM

## 2018-10-22 DIAGNOSIS — I10 Essential (primary) hypertension: Secondary | ICD-10-CM

## 2018-10-22 DIAGNOSIS — R5382 Chronic fatigue, unspecified: Secondary | ICD-10-CM

## 2018-10-22 DIAGNOSIS — M791 Myalgia, unspecified site: Secondary | ICD-10-CM

## 2018-10-22 DIAGNOSIS — G8929 Other chronic pain: Secondary | ICD-10-CM

## 2018-10-22 DIAGNOSIS — E119 Type 2 diabetes mellitus without complications: Secondary | ICD-10-CM | POA: Diagnosis not present

## 2018-10-22 DIAGNOSIS — M5412 Radiculopathy, cervical region: Secondary | ICD-10-CM

## 2018-10-22 DIAGNOSIS — R296 Repeated falls: Secondary | ICD-10-CM

## 2018-10-22 NOTE — Chronic Care Management (AMB) (Signed)
Chronic Care Management   Follow Up Note   10/22/2018 Name: Linda Mercado MRN: [id number] DOB: 07-28-1965  Referred by: Shelby Mattocks, PA-C Reason for referral : Chronic Care Management (TELEPHONE CCM FOLLOW UP )   Linda Mercado is a 53 y.o. year old female who is a primary care patient of [place], [name], [place]. The CCM team was consulted for assistance with chronic disease management and care coordination needs.    Review of patient status, including review of consultants reports, relevant laboratory and other test results, and collaboration with appropriate care team members and the patient's provider was performed as part of comprehensive patient evaluation and provision of chronic care management services.    The CCM team spoke with Ms. Linda Mercado by telephone today.   Goals Addressed      Patient Stated   . "I've had a couple of recent falls" (pt-stated)       Current Barriers:  Linda Mercado Knowledge Deficits related to origin of Pain and loss of muscle control . Lacks caregiver support.   . Frequent falls secondary to pain and loss of muscle control of lower extremities  Nurse Case Manager Clinical Goal(s):  Linda Mercado Over the next 30 days, patient will not experience ED visits and or IP admissions related to falls or injury from falls.  . Over the next 60 days, patient will have a new patient appointment with a Neurologist, and or a movement disorder specialist. (target goal date received due to COVID-19 delays)  Interventions:   Telephone CCM follow up call completed with patient today  Assessed for ongoing falls (none reported)  Provided patient with her new patient Neurology appointment, provided date, time, address and phone number  Confirmed patient will have transportation for this appointment (states daughter may be able to provide transportation, states she would like resources for transportation if dtr is unavailable)  Collaboration with [place]  re: need for transportation resources  Encouraged patient to call the CCM team if needed and confirmed patient has contact #   Scheduled telephone follow up with patient   Patient Self Care Activities:  . Currently UNABLE TO independently participate in self care  Please see past updates related to this goal by clicking on the "Past Updates" button in the selected goal      . "My bones hurt" "The pain pump is not helping" (pt-stated)       Current Barriers:  Linda Mercado Knowledge Deficits related to unknown etiology for Pain . Lacks caregiver support  Nurse Case Manager Clinical Goal(s):  Linda Mercado Over the next 60 days, patient will verbalize understanding of plan for evaluation of and treatment for pain. (Revised goal target date due to COVID-19 delays in care)  Interventions:   Telephone CCM follow up completed with patient today   Assessed for adherence to completing follow up with Pain doctor   Discussed patient's pain medication changes from Oxycodone to Dilaudid effective 11/16/18 per patient's Pain MD  Dicussed plan to have intrathecal pain med changed on 4/15 in the office if deemed safe due to [place], pt will follow up with Pain MD again on 11/15/18 if deemed safe due to COVID  Discussed transportation arrangements for patient to make all scheduled OV (pts daughter may be able to provide transportation but this is not definite)  Reinforced the importance of patient keeping all scheduled MD appts  Collaboration with [place], requesting she provide patient with any available resources to assist with transportation Linda Mercado will call patient  back today)   Scheduled RNCM telephone follow up with patient for 7-14 days  Patient Self Care Activities:   Verbalizes understanding of information/education provided . Currently UNABLE TO independently perform self care   Please see past updates related to this goal by clicking on the "Past Updates" button in the selected goal           The CM team will reach out to the patient again over the next 7-14 days.   Barb Merino, RN,CCM Care Management Coordinator [place] [facility]  Direct Phone: [phone]

## 2018-10-22 NOTE — Patient Instructions (Addendum)
Visit Information  Goals Addressed      Patient Stated   . "I've had a couple of recent falls" (pt-stated)       Current Barriers:  [name] Knowledge Deficits related to origin of Pain and loss of muscle control . Lacks caregiver support.   . Frequent falls secondary to pain and loss of muscle control of lower extremities  Nurse Case Manager Clinical Goal(s):  [name] Over the next 30 days, patient will not experience ED visits and or IP admissions related to falls or injury from falls.  . Over the next 60 days, patient will have a new patient appointment with a Neurologist, and or a movement disorder specialist. (target goal date received due to COVID-19 delays)  Interventions:   Telephone CCM follow up call completed with patient today  Assessed for ongoing falls (none reported)  Provided patient with her new patient Neurology appointment, provided date, time, address and phone number  Confirmed patient will have transportation for this appointment (states daughter may be able to provide transportation, states she would like resources for transportation if dtr is unavailable)  Collaboration with [place] re: need for transportation resources  Encouraged patient to call the CCM team if needed and confirmed patient has contact #   Scheduled telephone follow up with patient   Patient Self Care Activities:  . Currently UNABLE TO independently participate in self care  Please see past updates related to this goal by clicking on the "Past Updates" button in the selected goal      . "My bones hurt" "The pain pump is not helping" (pt-stated)       Current Barriers:  [name] Knowledge Deficits related to unknown etiology for Pain . Lacks caregiver support  Nurse Case Manager Clinical Goal(s):  [name] Over the next 60 days, patient will verbalize understanding of plan for evaluation of and treatment for pain. (Revised goal target date due to COVID-19 delays in care)  Interventions:    Telephone CCM follow up completed with patient today   Assessed for adherence to completing follow up with Pain doctor   Discussed patient's pain medication changes from Oxycodone to Dilaudid effective 11/16/18 per patient's Pain MD  Dicussed plan to have intrathecal pain med changed on 4/15 in the office if deemed safe due to [place], pt will follow up with Pain MD again on 11/15/18 if deemed safe due to COVID  Discussed transportation arrangements for patient to make all scheduled OV (pts daughter may be able to provide transportation but this is not definite)  Reinforced the importance of patient keeping all scheduled MD appts  Collaboration with [place], requesting she provide patient with any available resources to assist with transportation [name] will call patient back today)   Scheduled RNCM telephone follow up with patient for 7-14 days  Patient Self Care Activities:   Verbalizes understanding of information/education provided . Currently UNABLE TO independently perform self care   Please see past updates related to this goal by clicking on the "Past Updates" button in the selected goal         The patient verbalized understanding of instructions provided today and declined a print copy of patient instruction materials.   The CM team will reach out to the patient again over the next 7-14 days.    Barb Merino, RN,CCM Care Management Coordinator [facility]  Direct Phone: [phone]

## 2018-10-22 NOTE — Chronic Care Management (AMB) (Signed)
Chronic Care Management    Clinical Social Work Follow Up Note  10/22/2018 Name: Linda Mercado MRN: [id number] DOB: 1965-07-30  MARVENE STROHM is a 53 y.o. year old female who is a primary care patient of [place], [name], [place]. The CCM team was consulted for assistance with chronic disease management and care coordination.   Review of patient status, including review of consultants reports, other relevant assessments, and collaboration with appropriate care team members and the patient's provider was performed as part of comprehensive patient evaluation and provision of chronic care management services.     I placed a collaborative call to the patient on today's date with CCM RN case manager Angel Little.  Goals Addressed            This Visit's Progress     Patient Stated   . "I don't want to worry about food" (pt-stated)       Current Barriers:  . Financial constraints . Limited access to food . 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 Lacks knowledge of local food pantry sites  Clinical Social Work Clinical Goal(s):  [name] Over the next 30 days, patient will work with SW to address needs related to food insecurites  Interventions: . Assessed for questions surrounding recent resource mailing - the patient reports she did not receive  . Mailed the patient a food and nutrition services application for food stamps as well as "[employer] [place]" to identify local food pantry's in [facility] . Educated the patient on each resource  Patient Self Care Activities:  . The patient is able to outreach family members when monetary assistance is needed . The patient is responsible for budgeting her family income regarding bills and food needs . The patient is unable to visit local food pantry sites but reports her god-daughter is able to assist  Please see past updates related to this goal by clicking on the "Past Updates" button in the  selected goal      . "I need more help" (pt-stated)       Current Barriers:  . Financial constraints . Limited social support . Lacks knowledge of: Medicaid benefits . Chronic pain effecting the patients ability to perform household chores  Clinical Social Work Clinical Goal(s):  [name] Over the next 30 days, client will work with SW to obtain a caregiver through [facility] PCS program  Interventions: . Informed the patient of reported lack of PCS benefit provided to SW by [facility] . Interviewed and assessed the patient for type of Medicaid coverage - the patient reports she has "full Medicaid" . Obtained patients permission to contact DSS on her behalf to inquire the specific type of Medicaid coverage the patient has . Placed a call to [place] and spoke with [facility]. SW informed the patient's type of Medicaid coverage only covers her Medicare premium costs . Discussed patients self-reported income being below the Medicaid eligibility threshold and inquired is her adult children with cognitive impairments living in the home would effect her eligibility . Requested callback from the patients caseworker - SW informed caseworker would outreach in the next 24-48 business hours . Received return call from Carmina Miller, [place] caseworker who reports the patient does in fact only have MQB Medicaid due to her caseworker needing financial records detailing income of her own versus that of her disabled adult children . Contacted the patient to discuss needed information. The patient reports she does in fact have  these documents but does not feel safe visiting DSS at this time. . Placed a collaborative call with the patient to [place] to obtain mailing address for the patient to submit needed documents . Encouraged the patient to submit necessary documents as soon as possible in hopes of gaining access to full Medicaid benefits such as PCS services and transportation   Patient Self Care Activities:  . Currently UNABLE TO independently perform ADL's and iADL's    Please see past updates related to this goal by clicking on the "Past Updates" button in the selected goal      . "I want help installing a ramp" (pt-stated)       Current Barriers:  . Financial constraints . Limited social support  . Physical pain  Clinical Social Work Clinical Goal(s):  [name] Over the next 45 days, patient will work with SW to identify resources to assist with ramp installation  Interventions:  Provided the patient with the contact number to [employer] with [employer]  Encouraged the patient to outreach Mrs. Hopkins as soon as possible to continue the eligibility screen regarding home modification services  Patient Self Care Activities:  . The patient reports she currently has a metal ramp but is unable to install on her own  Please see past updates related to this goal by clicking on the "Past Updates" button in the selected goal          Follow Up Plan: SW will follow up with patient by phone over the next 7 days.   Daneen Schick, BSW, CDP [facility] / [facility] Care Management Social Worker [phone]  Total time spent performing care coordination and/or care management activities with the patient by phone or face to face = 53 minutes.

## 2018-10-22 NOTE — Patient Instructions (Signed)
Social Worker Visit Information  Goals we discussed today:  Goals Addressed            This Visit's Progress     Patient Stated   . "I don't want to worry about food" (pt-stated)       Current Barriers:  . Financial constraints . Limited access to food . 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 Lacks knowledge of local food pantry sites  Clinical Social Work Clinical Goal(s):  [name] Over the next 30 days, patient will work with SW to address needs related to food insecurites  Interventions: . Assessed for questions surrounding recent resource mailing - the patient reports she did not receive  . Mailed the patient a food and nutrition services application for food stamps as well as "[employer] [place]" to identify local food pantry's in [facility] . Educated the patient on each resource  Patient Self Care Activities:  . The patient is able to outreach family members when monetary assistance is needed . The patient is responsible for budgeting her family income regarding bills and food needs . The patient is unable to visit local food pantry sites but reports her god-daughter is able to assist  Please see past updates related to this goal by clicking on the "Past Updates" button in the selected goal      . "I need more help" (pt-stated)       Current Barriers:  . Financial constraints . Limited social support . Lacks knowledge of: Medicaid benefits . Chronic pain effecting the patients ability to perform household chores  Clinical Social Work Clinical Goal(s):  [name] Over the next 30 days, client will work with SW to obtain a caregiver through [facility] PCS program  Interventions: . Informed the patient of reported lack of PCS benefit provided to SW by [facility] . Interviewed and assessed the patient for type of Medicaid coverage - the patient reports she has "full Medicaid" . Obtained patients permission to contact DSS on her behalf  to inquire the specific type of Medicaid coverage the patient has . Placed a call to [place] and spoke with [facility]. SW informed the patient's type of Medicaid coverage only covers her Medicare premium costs . Discussed patients self-reported income being below the Medicaid eligibility threshold and inquired is her adult children with cognitive impairments living in the home would effect her eligibility . Requested callback from the patients caseworker - SW informed caseworker would outreach in the next 24-48 business hours . Received return call from Carmina Miller, [place] caseworker who reports the patient does in fact only have MQB Medicaid due to her caseworker needing financial records detailing income of her own versus that of her disabled adult children . Contacted the patient to discuss needed information. The patient reports she does in fact have these documents but does not feel safe visiting DSS at this time. . Placed a collaborative call with the patient to [place] to obtain mailing address for the patient to submit needed documents . Encouraged the patient to submit necessary documents as soon as possible in hopes of gaining access to full Medicaid benefits such as PCS services and transportation  Patient Self Care Activities:  . Currently UNABLE TO independently perform ADL's and iADL's    Please see past updates related to this goal by clicking on the "Past Updates" button in the selected goal      . "I want help installing a  ramp" (pt-stated)       Current Barriers:  . Financial constraints . Limited social support  . Physical pain  Clinical Social Work Clinical Goal(s):  [name] Over the next 45 days, patient will work with SW to identify resources to assist with ramp installation  Interventions:  Provided the patient with the contact number to [employer] with [employer]  Encouraged the patient to outreach Mrs. Hopkins as soon as  possible to continue the eligibility screen regarding home modification services  Patient Self Care Activities:  . The patient reports she currently has a metal ramp but is unable to install on her own  Please see past updates related to this goal by clicking on the "Past Updates" button in the selected goal          Materials Provided: Verbal education about Medicaid, food resources provided by phone  Follow Up Plan: SW will follow up with patient by phone over the next 7 days.   Daneen Schick, BSW, CDP [facility] / [facility] Care Management Social Worker [phone]

## 2018-10-23 ENCOUNTER — Telehealth: Payer: Medicare Other

## 2018-10-27 ENCOUNTER — Other Ambulatory Visit: Payer: Self-pay

## 2018-10-27 ENCOUNTER — Ambulatory Visit: Payer: Medicare Other

## 2018-10-27 DIAGNOSIS — M5416 Radiculopathy, lumbar region: Secondary | ICD-10-CM

## 2018-10-27 DIAGNOSIS — E119 Type 2 diabetes mellitus without complications: Secondary | ICD-10-CM

## 2018-10-27 DIAGNOSIS — I10 Essential (primary) hypertension: Secondary | ICD-10-CM

## 2018-10-27 MED ORDER — METFORMIN HCL 500 MG PO TABS: 500.0000 mg | ORAL_TABLET | Freq: Two times a day (BID) | ORAL | 0 refills | Status: DC

## 2018-10-27 NOTE — Chronic Care Management (AMB) (Signed)
  Chronic Care Management    Clinical Social Work Follow Up Note  10/27/2018 Name: Linda Mercado MRN: [id number] DOB: [birth year]  Linda Mercado is a 53 y.o. year old female who is a primary care patient of [place], [name], [place]. The CCM team was consulted for assistance with [employer].   Review of patient status, including review of consultants reports, other relevant assessments, and collaboration with appropriate care team members and the patient's provider was performed as part of comprehensive patient evaluation and provision of chronic care management services.     Goals Addressed            This Visit's Progress     Patient Stated   . "I need more help" (pt-stated)       Current Barriers:  . Financial constraints . Limited social support . Lacks knowledge of: Medicaid benefits . Chronic pain effecting the patients ability to perform household chores  Clinical Social Work Clinical Goal(s):  [name] Over the next 30 days, client will work with SW to obtain a caregiver through [facility] PCS program  Interventions:  Performed appropriate interview to assess progress of patient submitting documents - the patient reports she has yet to submit  Contacted DSS to inquire how the patient might go about submitting documents electronically  Obtained online portal for the the patient to submit necessary documents  Contacted the patient to provide instructions for document submission  Encouraged the patient to submit documents today considering it is a short work week for most  Advised the patient to outreach her caseworker no later than Monday to confirm documents were received  Patient Self Care Activities:  . Currently UNABLE TO independently perform ADL's and iADL's    Please see past updates related to this goal by clicking on the "Past Updates" button in the selected goal      . "I want help installing a ramp" (pt-stated)   Not on track    Current  Barriers:  . Financial constraints . Limited social support  . Physical pain  Clinical Social Work Clinical Goal(s):  [name] Over the next 45 days, patient will work with SW to identify resources to assist with ramp installation  Interventions:  Contacted the patient to assess ability to complete intake call with [place] patient has yet to contact [place] the patient to outreach Mrs. Hopkins as soon as possible to continue the eligibility screen regarding home modification services   Patient Self Care Activities:  . The patient reports she currently has a metal ramp but is unable to install on her own  Please see past updates related to this goal by clicking on the "Past Updates" button in the selected goal          Follow Up Plan: SW will follow up with patient by phone over the next 2-3 weeks.   Daneen Schick, BSW, CDP [facility] / [facility] Care Management Social Worker [phone]  Total time spent performing care coordination and/or care management activities with the patient by phone or face to face = 15 minutes.

## 2018-10-27 NOTE — Patient Instructions (Signed)
Social Worker Visit Information  Goals we discussed today:  Goals Addressed            This Visit's Progress     Patient Stated   . "I need more help" (pt-stated)       Current Barriers:  . Financial constraints . Limited social support . Lacks knowledge of: Medicaid benefits . Chronic pain effecting the patients ability to perform household chores  Clinical Social Work Clinical Goal(s):  [name] Over the next 30 days, client will work with SW to obtain a caregiver through [facility] PCS program  Interventions:  Performed appropriate interview to assess progress of patient submitting documents - the patient reports she has yet to submit  Contacted DSS to inquire how the patient might go about submitting documents electronically  Obtained online portal for the the patient to submit necessary documents  Contacted the patient to provide instructions for document submission  Encouraged the patient to submit documents today considering it is a short work week for most  Advised the patient to outreach her caseworker no later than Monday to confirm documents were received  Patient Self Care Activities:  . Currently UNABLE TO independently perform ADL's and iADL's    Please see past updates related to this goal by clicking on the "Past Updates" button in the selected goal      . "I want help installing a ramp" (pt-stated)   Not on track    Current Barriers:  . Financial constraints . Limited social support  . Physical pain  Clinical Social Work Clinical Goal(s):  [name] Over the next 45 days, patient will work with SW to identify resources to assist with ramp installation  Interventions:  Contacted the patient to assess ability to complete intake call with [place] patient has yet to contact [place] the patient to outreach Mrs. Hopkins as soon as possible to continue the eligibility screen regarding home modification services    Patient Self Care Activities:  . The patient reports she currently has a metal ramp but is unable to install on her own  Please see past updates related to this goal by clicking on the "Past Updates" button in the selected goal          Materials Provided: Verbal education about DSS provided by phone  Follow Up Plan: SW will follow up with patient by phone over the next 2-3 weeks  Daneen Schick, [place], CDP [facility] / [place] Management Social Worker [phone]

## 2018-11-03 ENCOUNTER — Other Ambulatory Visit: Payer: Self-pay | Admitting: Internal Medicine

## 2018-11-04 ENCOUNTER — Other Ambulatory Visit: Payer: Self-pay

## 2018-11-04 MED ORDER — LOSARTAN POTASSIUM 25 MG PO TABS: 25.0000 mg | ORAL_TABLET | Freq: Every day | ORAL | 1 refills | Status: DC

## 2018-11-06 ENCOUNTER — Telehealth: Payer: Medicare Other

## 2018-11-10 ENCOUNTER — Telehealth: Payer: Medicare Other

## 2018-11-10 ENCOUNTER — Ambulatory Visit: Payer: Self-pay

## 2018-11-10 DIAGNOSIS — I10 Essential (primary) hypertension: Secondary | ICD-10-CM

## 2018-11-10 DIAGNOSIS — E119 Type 2 diabetes mellitus without complications: Secondary | ICD-10-CM

## 2018-11-10 DIAGNOSIS — M5416 Radiculopathy, lumbar region: Secondary | ICD-10-CM

## 2018-11-10 NOTE — Chronic Care Management (AMB) (Signed)
  Chronic Care Management   Social Work Note  11/10/2018 Name: [name] MRN: [id number] DOB: Jan 31, 1966  Linda Mercado is a 53 y.o. year old female who sees Glendale Chard MD for primary care. The CCM team was consulted for assistance with chronic care management and care coordination of resource needs.  I placed an unsuccessful call to the patient on today's date. I left a HIPAA compliant voice message requesting a return call.  Follow Up Plan: SW will follow up with patient by phone over the next 7-10 days.  Linda Mercado, BSW, CDP [facility] / [facility] Care Management Social Worker [phone]  Total time spent performing care coordination and/or care management activities with the patient by phone or face to face = 3 minutes.

## 2018-11-11 ENCOUNTER — Telehealth: Payer: Self-pay | Admitting: *Deleted

## 2018-11-11 NOTE — Telephone Encounter (Signed)
LVM requesting call back to update EMR prior to video visit next Tues.  

## 2018-11-16 ENCOUNTER — Ambulatory Visit: Payer: Self-pay

## 2018-11-16 ENCOUNTER — Telehealth: Payer: Self-pay

## 2018-11-16 DIAGNOSIS — M5416 Radiculopathy, lumbar region: Secondary | ICD-10-CM

## 2018-11-16 DIAGNOSIS — I10 Essential (primary) hypertension: Secondary | ICD-10-CM

## 2018-11-16 DIAGNOSIS — E119 Type 2 diabetes mellitus without complications: Secondary | ICD-10-CM

## 2018-11-16 NOTE — Chronic Care Management (AMB) (Signed)
  Chronic Care Management   Outreach Note  11/16/2018 Name: Linda Mercado MRN: [id number] DOB: [birth year]  Referred by: Glendale Chard, MD Reason for referral : Care Coordination   Second unsuccessful telephone outreach was attempted today. The patient was referred to the case management team by for assistance with chronic care management and care coordination of resource needs.  Follow Up Plan: A HIPPA compliant phone message was left for the patient providing contact information and requesting a return call.  The CM team will reach out to the patient again over the next 7-14 days.   Daneen Schick, BSW, CDP [facility] / [facility] Care Management Social Worker [phone]  Total time spent performing care coordination and/or care management activities with the patient by phone or face to face = 3 minutes.

## 2018-11-16 NOTE — Telephone Encounter (Signed)
LVM #2 requesting call back to update EMR. 

## 2018-11-16 NOTE — Telephone Encounter (Signed)
Pt asking for medication refill phenergan and birth control medication

## 2018-11-17 ENCOUNTER — Ambulatory Visit (INDEPENDENT_AMBULATORY_CARE_PROVIDER_SITE_OTHER): Payer: Medicare Other | Admitting: Diagnostic Neuroimaging

## 2018-11-17 ENCOUNTER — Encounter: Payer: Self-pay | Admitting: Diagnostic Neuroimaging

## 2018-11-17 ENCOUNTER — Encounter: Payer: Self-pay | Admitting: *Deleted

## 2018-11-17 ENCOUNTER — Other Ambulatory Visit: Payer: Self-pay

## 2018-11-17 DIAGNOSIS — G894 Chronic pain syndrome: Secondary | ICD-10-CM

## 2018-11-17 NOTE — Addendum Note (Signed)
Addended by: Florian Buff C on: 11/17/2018 11:59 AM   Modules accepted: Orders

## 2018-11-17 NOTE — Progress Notes (Signed)
    Virtual Visit via Video Note  I connected with Linda Mercado on 11/17/18 at  4:00 PM EDT by a video enabled telemedicine application and verified that I am speaking with the correct person using two identifiers.   I discussed the limitations of evaluation and management by telemedicine and the availability of in person appointments. The patient expressed understanding and agreed to proceed.  Patient is at their home. I am at the office.    History of Present Illness:  53 year old female here for evaluation of chronic pain syndrome. - accident in 2008; patient fell from a ladder and sustained multiple injuries requiring surgeries; then she had intrathecal pump in 2017 Thersa Salt) - since pump placement, more falls and balance difficulty -Patient mainly complaining of "bone pain" in all of her bones and joints from her fingers down to her toes.  She complains of pain all over her whole body. - more pain issues now over the last 1 to 2 years in spite of increased pain medication    Observations/Objective:  VIDEO EXAM  GENERAL EXAM/CONSTITUTIONAL:  Vitals: There were no vitals filed for this visit.  There is no height or weight on file to calculate BMI. Wt Readings from Last 3 Encounters:  10/06/18 159 lb (72.1 kg)  09/17/18 155 lb (70.3 kg)  08/13/18 157 lb (71.2 kg)     Patient is in no distress; well developed, nourished and groomed; neck is supple   NEUROLOGIC: MENTAL STATUS:  No flowsheet data found.  awake, alert, oriented to person, place and time  recent and remote memory intact  normal attention and concentration  language fluent, comprehension intact, naming intact  fund of knowledge appropriate  CRANIAL NERVE:   2nd, 3rd, 4th, 6th - visual fields full to confrontation, extraocular muscles intact, no nystagmus  5th - facial sensation symmetric  7th - facial strength symmetric  8th - hearing intact  11th - shoulder shrug symmetric  12th -  tongue protrusion midline  MOTOR:   NO TREMOR; NO DRIFT IN BUE  SENSORY:   normal and symmetric to light touch  COORDINATION:   fine finger movements normal    Assessment and Plan:  JOINT PAIN / BONE PAIN - likely post-traumatic --> transitioning to chronic pain syndrome - continue pain mgmt - follow up ANA - consider rheumatology evalution   Follow Up Instructions:  - Return for pending if symptoms worsen or fail to improve, return to PCP.    I discussed the assessment and treatment plan with the patient. The patient was provided an opportunity to ask questions and all were answered. The patient agreed with the plan and demonstrated an understanding of the instructions.   The patient was advised to call back or seek an in-person evaluation if the symptoms worsen or if the condition fails to improve as anticipated.  I provided 25 minutes of non-face-to-face time during this encounter including records review.   Penni Bombard, MD 7/86/7672, 0:94 PM Certified in Neurology, Neurophysiology and Neuroimaging  [facility] [address], [place], [place] [address] [phone]

## 2018-11-17 NOTE — Telephone Encounter (Signed)
Called patient and updated her EMR. I advised her Dr Leta Baptist is transitioning to [email or web address] for video visits, a much easier website to use. She agreed to change over; e mail sent to her with instructions. She Patient verbalized understanding, appreciation.

## 2018-11-19 ENCOUNTER — Other Ambulatory Visit: Payer: Self-pay

## 2018-11-19 ENCOUNTER — Telehealth: Payer: Self-pay

## 2018-11-19 MED ORDER — NORGESTIM-ETH ESTRAD TRIPHASIC 0.18/0.215/0.25 MG-35 MCG PO TABS: 1.0000 | ORAL_TABLET | Freq: Every day | ORAL | 1 refills | Status: DC

## 2018-11-19 MED ORDER — PROMETHAZINE HCL 25 MG PO TABS: 25.0000 mg | ORAL_TABLET | Freq: Four times a day (QID) | ORAL | 2 refills | Status: DC | PRN

## 2018-11-19 MED ORDER — ONDANSETRON HCL 4 MG PO TABS: 4.0000 mg | ORAL_TABLET | Freq: Four times a day (QID) | ORAL | 2 refills | Status: DC

## 2018-11-19 NOTE — Telephone Encounter (Signed)
Patient consented to virtual appointment and also birth control refill has been sent to pharmacy. YRL,RMA

## 2018-11-20 ENCOUNTER — Other Ambulatory Visit: Payer: Self-pay

## 2018-11-23 ENCOUNTER — Other Ambulatory Visit: Payer: Self-pay

## 2018-11-23 ENCOUNTER — Ambulatory Visit (INDEPENDENT_AMBULATORY_CARE_PROVIDER_SITE_OTHER): Payer: Medicare Other | Admitting: Nurse Practitioner

## 2018-11-23 ENCOUNTER — Encounter: Payer: Self-pay | Admitting: Nurse Practitioner

## 2018-11-23 DIAGNOSIS — R112 Nausea with vomiting, unspecified: Secondary | ICD-10-CM | POA: Insufficient documentation

## 2018-11-23 DIAGNOSIS — R110 Nausea: Secondary | ICD-10-CM

## 2018-11-23 DIAGNOSIS — E559 Vitamin D deficiency, unspecified: Secondary | ICD-10-CM | POA: Diagnosis not present

## 2018-11-23 MED ORDER — NORGESTIM-ETH ESTRAD TRIPHASIC 0.18/0.215/0.25 MG-35 MCG PO TABS: 1.0000 | ORAL_TABLET | Freq: Every day | ORAL | 1 refills | Status: DC

## 2018-11-23 MED ORDER — PROMETHAZINE HCL 25 MG PO TABS: 25.0000 mg | ORAL_TABLET | Freq: Four times a day (QID) | ORAL | 2 refills | Status: DC | PRN

## 2018-11-23 MED ORDER — VITAMIN D (ERGOCALCIFEROL) 1.25 MG (50000 UNIT) PO CAPS: ORAL_CAPSULE | ORAL | 1 refills | Status: DC

## 2018-11-23 MED ORDER — VITAMIN D (ERGOCALCIFEROL) 1.25 MG (50000 UNIT) PO CAPS: ORAL_CAPSULE | ORAL | 0 refills | Status: DC

## 2018-11-23 NOTE — Progress Notes (Signed)
Virtual Visit via Video ([email or web address])    This visit type was conducted due to national recommendations for restrictions regarding the COVID-19 Pandemic (e.g. social distancing) in an effort to limit this patient's exposure and mitigate transmission in our community.  Patients identity confirmed using two different identifiers.  This format is felt to be most appropriate for this patient at this time.  All issues noted in this document were discussed and addressed.  No physical exam was performed (except for noted visual exam findings with Video Visits).    Date:  12/14/2018   ID:  Linda Mercado, DOB August 17, 1965, MRN [id number]  Patient Location:  [place]  Provider location:   Office    Chief Complaint:  nausea  History of Present Illness:    Linda Mercado is a 53 y.o. female who presents via video conferencing for a telehealth visit today.    The patient does not have symptoms concerning for COVID-19 infection (fever, chills, cough, or new shortness of breath).   Birth control pills refill. Phenergan and vitamin d.    Chronic nausea - she reports she is always nauseated and has been for some time. She has been taking promethazine as needed.  She is on multiple medications prescribed by her pain provider and behavioral health which can cause nausea    Past Medical History:  Diagnosis Date  . Bradycardia   . Chronic back pain   . Diabetes mellitus without complication ([place])   . Hypertension   . Hypokalemia    Past Surgical History:  Procedure Laterality Date  . BACK SURGERY       Current Meds  Medication Sig  . acetaminophen (TYLENOL) 500 MG tablet Take 2 tablets (1,000 mg total) by mouth 3 (three) times daily at 8am, 2pm and bedtime.  [name] albuterol (PROVENTIL HFA;VENTOLIN HFA) 108 (90 Base) MCG/ACT inhaler Inhale 2 puffs into the lungs every 6 (six) hours as needed for wheezing or shortness of breath.  . AMITIZA 24 MCG capsule 24 mcg 2 (two) times daily.  [name]  atenolol (TENORMIN) 100 MG tablet TAKE 1 TABLET(100 MG) BY MOUTH DAILY  . atorvastatin (LIPITOR) 20 MG tablet Take 1 tablet (20 mg total) by mouth at bedtime.  . calcium gluconate 500 MG tablet Take one Monday through Friday only.  . clonazePAM (KLONOPIN) 1 MG tablet Take 1 mg by mouth 3 (three) times daily.  . Cyanocobalamin (B-12) 500 MCG SUBL One SL QD  . dexlansoprazole (DEXILANT) 60 MG capsule Take 1 capsule (60 mg total) by mouth daily.  . diclofenac sodium (VOLTAREN) 1 % GEL Apply 4 g topically 4 (four) times daily as needed for pain.  . divalproex (DEPAKOTE ER) 500 MG 24 hr tablet Take 1,000 mg by mouth daily.   [name] docusate sodium (COLACE) 50 MG capsule Take 50 mg by mouth at bedtime.  [name] etodolac (LODINE) 400 MG tablet Take 400 mg by mouth 2 (two) times daily.  . fluticasone (FLOVENT HFA) 110 MCG/ACT inhaler Inhale 2 puffs into the lungs every 12 (twelve) hours.  [name] FREESTYLE LITE test strip CHECK BLOOD SUGAR 3 TO 4 TIMES DAILY  . hydrochlorothiazide (HYDRODIURIL) 12.5 MG tablet Take 1 tablet by mouth once daily.  . hydrOXYzine (ATARAX/VISTARIL) 25 MG tablet Take 25 mg by mouth 2 (two) times daily.   [name] losartan (COZAAR) 25 MG tablet Take 1 tablet (25 mg total) by mouth daily.  [name] NARCAN 4 MG/0.1ML LIQD nasal spray kit Place 1 spray into the  nose as directed.  . Norgestimate-Ethinyl Estradiol Triphasic (TRI-ESTARYLLA) 0.18/0.215/0.25 MG-35 MCG tablet Take 1 tablet by mouth daily.  [name] omeprazole (PRILOSEC) 20 MG capsule Take 1 capsule (20 mg total) by mouth daily.  [name] oxyCODONE (OXY IR/ROXICODONE) 5 MG immediate release tablet Take 5 mg by mouth 4 (four) times daily.  . polyethylene glycol (MIRALAX / GLYCOLAX) packet Take 17 g by mouth daily. For Bowels  . promethazine (PHENERGAN) 25 MG tablet Take 1 tablet (25 mg total) by mouth every 6 (six) hours as needed for nausea or vomiting.  [name] QUEtiapine (SEROQUEL XR) 400 MG 24 hr tablet Take 400 mg by mouth 2 (two) times daily.  [name] triamcinolone cream  (KENALOG) 0.1 % Apply 1 application topically daily as needed (for itching).   . vortioxetine HBr (TRINTELLIX) 10 MG TABS tablet Take 10 mg by mouth at bedtime.  [name] VRAYLAR 6 MG CAPS Take 1 capsule by mouth daily.  . [DISCONTINUED] metFORMIN (GLUCOPHAGE) 500 MG tablet Take 1 tablet (500 mg total) by mouth 2 (two) times daily with a meal.  . [DISCONTINUED] Norgestimate-Ethinyl Estradiol Triphasic (TRI-ESTARYLLA) 0.18/0.215/0.25 MG-35 MCG tablet Take 1 tablet by mouth daily.  . [DISCONTINUED] Norgestimate-Ethinyl Estradiol Triphasic (TRI-ESTARYLLA) 0.18/0.215/0.25 MG-35 MCG tablet Take 1 tablet by mouth daily.  . [DISCONTINUED] ondansetron (ZOFRAN) 4 MG tablet Take 1 tablet (4 mg total) by mouth every 6 (six) hours.  . [DISCONTINUED] promethazine (PHENERGAN) 25 MG tablet Take 1 tablet (25 mg total) by mouth every 6 (six) hours as needed for nausea or vomiting.  . [DISCONTINUED] tiZANidine (ZANAFLEX) 4 MG tablet Take 4 mg by mouth every 8 (eight) hours as needed (for nerve pain).   . [DISCONTINUED] traZODone (DESYREL) 100 MG tablet Take 1 tablet (100 mg total) by mouth at bedtime.  . [DISCONTINUED] Vitamin D, Ergocalciferol, (DRISDOL) 1.25 MG (50000 UT) CAPS capsule One twice a week with a fatty meal     Allergies:   Lisinopril; Latex; Adhesive [tape]; and Sulfamethoxazole-trimethoprim   Social History   Tobacco Use  . Smoking status: Never Smoker  . Smokeless tobacco: Never Used  Substance Use Topics  . Alcohol use: Not Currently    Frequency: Never  . Drug use: Never     Family Hx: The patient's family history includes Diabetes in her father and another family member; Hypertension in her father and another family member. There is no history of Stroke, Cancer, or CAD.  ROS:   Please see the history of present illness.    Review of Systems  Respiratory: Negative.  Negative for cough.   Cardiovascular: Negative.   Neurological: Negative for dizziness and tingling.   Psychiatric/Behavioral: Negative.        History of schizophrenia    All other systems reviewed and are negative.   Labs/Other Tests and Data Reviewed:    Recent Labs: 03/19/2018: Magnesium 2.0; TSH 1.718 08/13/2018: Hemoglobin 13.9; Platelets 374 09/17/2018: ALT 9; BUN 13; Creatinine, Ser 0.85; Potassium 3.9; Sodium 141   Recent Lipid Panel No results found for: CHOL, TRIG, HDL, CHOLHDL, LDLCALC, LDLDIRECT  Wt Readings from Last 3 Encounters:  10/06/18 159 lb (72.1 kg)  09/17/18 155 lb (70.3 kg)  08/13/18 157 lb (71.2 kg)     Exam:    Vital Signs:  There were no vitals taken for this visit.    Physical Exam  Constitutional: She is oriented to person, place, and time and well-developed, well-nourished, and in no distress.  Pulmonary/Chest: Effort normal.  Neurological: She is alert and oriented  to person, place, and time.  Skin: Skin is warm and dry.  Psychiatric: Mood, memory and judgment normal.  Flat affect    ASSESSMENT & PLAN:     1. Nausea  Chronic and ongoing  Refill sent  Advised to not take with other sedating medications - promethazine (PHENERGAN) 25 MG tablet; Take 1 tablet (25 mg total) by mouth every 6 (six) hours as needed for nausea or vomiting.  Dispense: 30 tablet; Refill: 2  2. Vitamin D deficiency  Will check vitamin D level and supplement as needed.     Also encouraged to spend 15 minutes in the sun daily.  - Vitamin D, Ergocalciferol, (DRISDOL) 1.25 MG (50000 UT) CAPS capsule; One twice a week with a fatty meal  Dispense: 24 capsule; Refill: 0   COVID-19 Education: The signs and symptoms of COVID-19 were discussed with the patient and how to seek care for testing (follow up with PCP or arrange E-visit).  The importance of social distancing was discussed today.  Patient Risk:   After full review of this patients clinical status, I feel that they are at least moderate risk at this time.  Time:   Today, I have spent 20 minutes/ seconds  with the patient with telehealth technology discussing above diagnoses.     Medication Adjustments/Labs and Tests Ordered: Current medicines are reviewed at length with the patient today.  Concerns regarding medicines are outlined above.   Tests Ordered: No orders of the defined types were placed in this encounter.   Medication Changes: Meds ordered this encounter  Medications  . DISCONTD: Norgestimate-Ethinyl Estradiol Triphasic (TRI-ESTARYLLA) 0.18/0.215/0.25 MG-35 MCG tablet    Sig: Take 1 tablet by mouth daily.    Dispense:  3 Package    Refill:  1  . Vitamin D, Ergocalciferol, (DRISDOL) 1.25 MG (50000 UT) CAPS capsule    Sig: One twice a week with a fatty meal    Dispense:  24 capsule    Refill:  0  . promethazine (PHENERGAN) 25 MG tablet    Sig: Take 1 tablet (25 mg total) by mouth every 6 (six) hours as needed for nausea or vomiting.    Dispense:  30 tablet    Refill:  2    Pill pack please  . Norgestimate-Ethinyl Estradiol Triphasic (TRI-ESTARYLLA) 0.18/0.215/0.25 MG-35 MCG tablet    Sig: Take 1 tablet by mouth daily.    Dispense:  3 Package    Refill:  1    Disposition:  Follow up in 3 month(s)  Signed, [name], FNP

## 2018-11-26 ENCOUNTER — Telehealth: Payer: Self-pay

## 2018-11-26 ENCOUNTER — Ambulatory Visit: Payer: Self-pay

## 2018-11-26 DIAGNOSIS — M5416 Radiculopathy, lumbar region: Secondary | ICD-10-CM

## 2018-11-26 DIAGNOSIS — I10 Essential (primary) hypertension: Secondary | ICD-10-CM

## 2018-11-26 DIAGNOSIS — E559 Vitamin D deficiency, unspecified: Secondary | ICD-10-CM

## 2018-11-26 DIAGNOSIS — E119 Type 2 diabetes mellitus without complications: Secondary | ICD-10-CM

## 2018-11-26 NOTE — Chronic Care Management (AMB) (Signed)
Chronic Care Management    Clinical Social Work Follow Up Note  11/26/2018 Name: Linda Mercado MRN: [id number] DOB: 1965/10/28  Linda Mercado is a 53 y.o. year old female who is a primary care patient of Glendale Chard MD. The CCM team was consulted for assistance with [employer].   Review of patient status, including review of consultants reports, other relevant assessments, and collaboration with appropriate care team members and the patient's provider was performed as part of comprehensive patient evaluation and provision of chronic care management services.    Third unsuccessful outreach to the patient on today's date. SW left a HIPAA compliant voice message requesting a return call. SW has completed all SW relatable goals due to the inability to maintain contact and assess the patients progress. The below goals have not been met. SW will re-establish below goals if/when the patient returns CCM SW call.   Goals Addressed            This Visit's Progress     Patient Stated   . COMPLETED: "I don't want to worry about food" (pt-stated)       Current Barriers:  . Financial constraints . Limited access to food . 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 Lacks knowledge of local food pantry sites  Clinical Social Work Clinical Goal(s):  [name] Over the next 30 days, patient will work with SW to address needs related to food insecurites  Interventions: . Assessed for questions surrounding recent resource mailing - the patient reports she did not receive  . Mailed the patient a food and nutrition services application for food stamps as well as "[employer] [place]" to identify local food pantry's in [facility] . Educated the patient on each resource  Patient Self Care Activities:  . The patient is able to outreach family members when monetary assistance is needed . The patient is responsible for budgeting her family income regarding bills and  food needs . The patient is unable to visit local food pantry sites but reports her god-daughter is able to assist  Please see past updates related to this goal by clicking on the "Past Updates" button in the selected goal      . COMPLETED: "I need more help" (pt-stated)       Current Barriers:  . Financial constraints . Limited social support . Lacks knowledge of: Medicaid benefits . Chronic pain effecting the patients ability to perform household chores  Clinical Social Work Clinical Goal(s):  [name] Over the next 30 days, client will work with SW to obtain a caregiver through [facility] program  Interventions:  Performed appropriate interview to assess progress of patient submitting documents - the patient reports she has yet to submit  Contacted DSS to inquire how the patient might go about submitting documents electronically  Obtained online portal for the the patient to submit necessary documents  Contacted the patient to provide instructions for document submission  Encouraged the patient to submit documents today considering it is a short work week for most  Advised the patient to outreach her caseworker no later than Monday to confirm documents were received  Patient Self Care Activities:  . Currently UNABLE TO independently perform ADL's and iADL's    Please see past updates related to this goal by clicking on the "Past Updates" button in the selected goal      . COMPLETED: "I want help installing a ramp" (pt-stated)  Current Barriers:  . Financial constraints . Limited social support  . Physical pain  Clinical Social Work Clinical Goal(s):  [name] Over the next 45 days, patient will work with SW to identify resources to assist with ramp installation  Interventions:  Contacted the patient to assess ability to complete intake call with [place] patient has yet to contact [place] the patient to outreach  Mrs. Hopkins as soon as possible to continue the eligibility screen regarding home modification services   Patient Self Care Activities:  . The patient reports she currently has a metal ramp but is unable to install on her own  Please see past updates related to this goal by clicking on the "Past Updates" button in the selected goal          Follow Up Plan: No further SW follow up planned at this time.   Daneen Schick, BSW, CDP [facility] / [facility] Care Management Social Worker [phone]  Total time spent performing care coordination and/or care management activities with the patient by phone or face to face = 10 minutes.

## 2018-11-26 NOTE — Chronic Care Management (AMB) (Signed)
  Chronic Care Management    Clinical Social Work Follow Up Note  11/26/2018 Name: Linda Mercado MRN: [id number] DOB: [birth year]  Linda Mercado is a 53 y.o. year old female who is a primary care patient of Glendale Chard MD The CCM team was consulted for assistance with [employer].   Review of patient status, including review of consultants reports, other relevant assessments, and collaboration with appropriate care team members and the patient's provider was performed as part of comprehensive patient evaluation and provision of chronic care management services.    I received a return call from the patient. HIPAA identifiers confirmed.   Goals Addressed            This Visit's Progress     Patient Stated   . "I am really depressed" (pt-stated)       Current Barriers:  . Limited social support . ADL IADL limitations . Social Isolation  . Chronic Pain  Clinical Social Work Clinical Goal(s):  [name] Over the next 35 days, client will follow up with therapist as directed by SW  Interventions: . Patient interviewed and appropriate assessments performed . Discussed plans with patient for ongoing care management follow up and provided patient with direct contact information for care management team . Advised patient to contact her therapist and physician if recent medication adjustment is ineffective  . Collaboration with CCM RN Case Manager regarding patient identified depression . Encouraged the patient to continue speaking with family and spending time outside to assist with mood . Developed plan for future outreach   Patient Self Care Activities:  . Self administers medications as prescribed . Attends all scheduled provider appointments . Calls provider office for new concerns or questions  Initial goal documentation     . "I need more help" (pt-stated)       Current Barriers:  . Financial constraints . Limited social support . Lacks knowledge of: Medicaid benefits  . Chronic pain effecting the patients ability to perform household chores  Clinical Social Work Clinical Goal(s):  [name] Over the next 30 days, client will work with SW to obtain a caregiver through [facility] PCS program o Not met- re-established 11/26/18  Interventions:  Telephonic follow up by CCM SW  Determined the patient has submitted needed document to DSS and is awaiting response  Advised the patient to contact CCM SW once a response is received  Patient Self Care Activities:  . Currently UNABLE TO independently perform ADL's and iADL's    Please see past updates related to this goal by clicking on the "Past Updates" button in the selected goal          Follow Up Plan: SW will follow up with patient by phone over the next 2 weeks   Daneen Schick, BSW, CDP [facility] / [facility] Care Management Social Worker [phone]  Total time spent performing care coordination and/or care management activities with the patient by phone or face to face = 27 minutes.

## 2018-11-26 NOTE — Patient Instructions (Signed)
Social Worker Visit Information  Goals we discussed today:  Goals Addressed            This Visit's Progress     Patient Stated   . "I am really depressed" (pt-stated)       Current Barriers:  . Limited social support . ADL IADL limitations . Social Isolation  . Chronic Pain  Clinical Social Work Clinical Goal(s):  [name] Over the next 35 days, client will follow up with therapist as directed by SW  Interventions: . Patient interviewed and appropriate assessments performed . Discussed plans with patient for ongoing care management follow up and provided patient with direct contact information for care management team . Advised patient to contact her therapist and physician if recent medication adjustment is ineffective  . Collaboration with CCM RN Case Manager regarding patient identified depression . Encouraged the patient to continue speaking with family and spending time outside to assist with mood . Developed plan for future outreach   Patient Self Care Activities:  . Self administers medications as prescribed . Attends all scheduled provider appointments . Calls provider office for new concerns or questions  Initial goal documentation     . "I need more help" (pt-stated)       Current Barriers:  . Financial constraints . Limited social support . Lacks knowledge of: Medicaid benefits . Chronic pain effecting the patients ability to perform household chores  Clinical Social Work Clinical Goal(s):  [name] Over the next 30 days, client will work with SW to obtain a caregiver through [facility] P[facility] program o Not met- re-established 11/26/18  Interventions:  Telephonic follow up by CCM SW  Determined the patient has submitted needed document to DSS and is awaiting response  Advised the patient to contact CCM SW once a response is received  Patient Self Care Activities:  . Currently UNABLE TO independently perform ADL's and iADL's    Please see past updates related to  this goal by clicking on the "Past Updates" button in the selected goal          Materials Provided: No, patient declined.  Follow Up Plan: SW will follow up with patient by phone over the next 2 weeks  Daneen Schick, BSW, CDP [facility] / [place] Management Social Worker [phone]

## 2018-11-27 ENCOUNTER — Telehealth: Payer: Medicare Other

## 2018-11-30 ENCOUNTER — Other Ambulatory Visit: Payer: Self-pay | Admitting: Nurse Practitioner

## 2018-11-30 ENCOUNTER — Other Ambulatory Visit: Payer: Self-pay

## 2018-11-30 DIAGNOSIS — E119 Type 2 diabetes mellitus without complications: Secondary | ICD-10-CM

## 2018-11-30 MED ORDER — METFORMIN HCL 500 MG PO TABS: 500.0000 mg | ORAL_TABLET | Freq: Two times a day (BID) | ORAL | 1 refills | Status: DC

## 2018-12-04 ENCOUNTER — Ambulatory Visit: Payer: Self-pay

## 2018-12-04 DIAGNOSIS — E119 Type 2 diabetes mellitus without complications: Secondary | ICD-10-CM

## 2018-12-04 DIAGNOSIS — M5416 Radiculopathy, lumbar region: Secondary | ICD-10-CM

## 2018-12-04 DIAGNOSIS — E559 Vitamin D deficiency, unspecified: Secondary | ICD-10-CM

## 2018-12-04 DIAGNOSIS — I10 Essential (primary) hypertension: Secondary | ICD-10-CM

## 2018-12-04 NOTE — Patient Instructions (Signed)
Social Worker Visit Information  Goals we discussed today:  Goals Addressed            This Visit's Progress   . "I want to apply for food stamps"       Current Barriers:  . Limited social support  . Limited access to a computer . Lacks knowledge of assistance programs and changes in response to COVID 19  Clinical Social Work Clinical Goal(s):  [name] Over the next 30 days, client will follow up with Food [employer] Nutrition Services as directed by SW  Interventions: . Patient interviewed and appropriate assessments performed  . Received information from the patient that due to her children receiving free/reduced school lunch, she was informed they qualified for nutrition benefits . Educated the patient on ways to apply for assistance . Encouraged the patient to contact [place] to request assistance over the phone . Mailed the patient a paper application for completion in the event a worker is unable to assist telephonically  Patient Self Care Activities:  . Calls provider office for new concerns or questions  . States understanding of information provided during today's call  Initial goal documentation         Materials Provided: Yes: Nutrition application provided via mail  Follow Up Plan: SW will follow up with patient by phone over the next 3 weeks   Daneen Schick, [place], CDP [facility] / [facility] Care Management Social Worker [phone]

## 2018-12-04 NOTE — Chronic Care Management (AMB) (Signed)
  Chronic Care Management    Clinical Social Work Follow Up Note  12/04/2018 Name: Linda Mercado MRN: [id number] DOB: [birth year]  Linda Mercado is a 53 y.o. year old female who is a primary care patient of Glendale Chard MD. The CCM team was consulted for assistance with [employer].   Review of patient status, including review of consultants reports, other relevant assessments, and collaboration with appropriate care team members and the patient's provider was performed as part of comprehensive patient evaluation and provision of chronic care management services.     Goals Addressed            This Visit's Progress   . "I want to apply for food stamps"       Current Barriers:  . Limited social support  . Limited access to a computer . Lacks knowledge of assistance programs and changes in response to COVID 19  Clinical Social Work Clinical Goal(s):  [name] Over the next 30 days, client will follow up with [employer] as directed by SW  Interventions: . Patient interviewed and appropriate assessments performed  . Received information from the patient that due to her children receiving free/reduced school lunch, she was informed they qualified for nutrition benefits . Educated the patient on ways to apply for assistance . Encouraged the patient to contact [place] to request assistance over the phone . Mailed the patient a paper application for completion in the event a worker is unable to assist telephonically  Patient Self Care Activities:  . Calls provider office for new concerns or questions  . States understanding of information provided during today's call  Initial goal documentation         Follow Up Plan: SW will follow up with patient by phone over the next 3 weeks   Daneen Schick, BSW, CDP [facility] / [facility] Care Management Social Worker [phone]  Total time spent performing care coordination and/or care management activities with  the patient by phone or face to face = 15 minutes.

## 2018-12-08 ENCOUNTER — Other Ambulatory Visit: Payer: Self-pay

## 2018-12-08 ENCOUNTER — Ambulatory Visit: Payer: Self-pay

## 2018-12-08 DIAGNOSIS — E119 Type 2 diabetes mellitus without complications: Secondary | ICD-10-CM

## 2018-12-08 DIAGNOSIS — M5416 Radiculopathy, lumbar region: Secondary | ICD-10-CM

## 2018-12-08 DIAGNOSIS — E559 Vitamin D deficiency, unspecified: Secondary | ICD-10-CM

## 2018-12-08 DIAGNOSIS — I10 Essential (primary) hypertension: Secondary | ICD-10-CM

## 2018-12-08 MED ORDER — TRAZODONE HCL 100 MG PO TABS: 100.0000 mg | ORAL_TABLET | Freq: Every day | ORAL | 0 refills | Status: DC

## 2018-12-08 NOTE — Patient Instructions (Signed)
Social Worker Visit Information  Goals we discussed today:  Goals Addressed            This Visit's Progress     Patient Stated   . "I want help installing a ramp" (pt-stated)       Current Barriers:  . Financial constraints . Limited social support  . Physical pain  Clinical Social Work Clinical Goal(s):  [name] Over the next 45 days, patient will work with SW to identify resources to assist with ramp installation  Interventions:  Outreach call placed to [employer] to follow up on the status of patients referral  Voice message left for [employer] family services coordinator requesting an update be given to CCM SW via phone and/or e-mail correspondance  Patient Self Care Activities:  . The patient reports she currently has a metal ramp but is unable to install on her own  Please see past updates related to this goal by clicking on the "Past Updates" button in the selected goal          Materials Provided: No. Patient not reached.  Follow Up Plan: SW will follow up with patient by phone over the next 7-10 days   Daneen Schick, [place], CDP [facility] / [place] Management Social Worker [phone]

## 2018-12-08 NOTE — Chronic Care Management (AMB) (Signed)
  Chronic Care Management    Clinical Social Work Follow Up Note  12/08/2018 Name: Linda Mercado MRN: [id number] DOB: 1965-07-31  Linda Mercado is a 53 y.o. year old female who is a primary care patient of Glendale Chard MD. The CCM team was consulted for assistance with [employer].   Review of patient status, including review of consultants reports, other relevant assessments, and collaboration with appropriate care team members and the patient's provider was performed as part of comprehensive patient evaluation and provision of chronic care management services.     I placed a care coordination call to [employer] requesting updates on below stated goal.  Goals Addressed            This Visit's Progress     Patient Stated   . "I want help installing a ramp" (pt-stated)       Current Barriers:  . Financial constraints . Limited social support  . Physical pain  Clinical Social Work Clinical Goal(s):  [name] Over the next 45 days, patient will work with SW to identify resources to assist with ramp installation  Interventions:  Outreach call placed to [employer] to follow up on the status of patients referral  Voice message left for [employer] family services coordinator requesting an update be given to CCM SW via phone and/or e-mail correspondance  Patient Self Care Activities:  . The patient reports she currently has a metal ramp but is unable to install on her own  Please see past updates related to this goal by clicking on the "Past Updates" button in the selected goal          Follow Up Plan: SW will follow up with patient by phone over the next 7-10 days.   Daneen Schick, BSW, CDP [facility] / [facility] Care Management Social Worker [phone]  Total time spent performing care coordination and/or care management activities with the patient by phone or face to face = 5 minutes.

## 2018-12-10 ENCOUNTER — Ambulatory Visit: Payer: Self-pay

## 2018-12-10 ENCOUNTER — Telehealth: Payer: Medicare Other

## 2018-12-10 ENCOUNTER — Ambulatory Visit (INDEPENDENT_AMBULATORY_CARE_PROVIDER_SITE_OTHER): Payer: Medicare Other

## 2018-12-10 ENCOUNTER — Other Ambulatory Visit: Payer: Self-pay

## 2018-12-10 DIAGNOSIS — R296 Repeated falls: Secondary | ICD-10-CM

## 2018-12-10 DIAGNOSIS — M5412 Radiculopathy, cervical region: Secondary | ICD-10-CM

## 2018-12-10 DIAGNOSIS — E119 Type 2 diabetes mellitus without complications: Secondary | ICD-10-CM

## 2018-12-10 DIAGNOSIS — E559 Vitamin D deficiency, unspecified: Secondary | ICD-10-CM

## 2018-12-10 DIAGNOSIS — M5416 Radiculopathy, lumbar region: Secondary | ICD-10-CM

## 2018-12-10 DIAGNOSIS — G8929 Other chronic pain: Secondary | ICD-10-CM

## 2018-12-10 DIAGNOSIS — I10 Essential (primary) hypertension: Secondary | ICD-10-CM | POA: Diagnosis not present

## 2018-12-10 NOTE — Chronic Care Management (AMB) (Signed)
  Chronic Care Management    Clinical Social Work Follow Up Note  12/10/2018 Name: Linda Mercado MRN: [id number] DOB: Sep 23, 1965  Linda Mercado is a 53 y.o. year old female who is a primary care patient of Glendale Chard MD. The CCM team was consulted for assistance with [employer].   Review of patient status, including review of consultants reports, other relevant assessments, and collaboration with appropriate care team members and the patient's provider was performed as part of comprehensive patient evaluation and provision of chronic care management services.     Goals Addressed            This Visit's Progress     Patient Stated   . "I need more help" (pt-stated)   Not on track    Current Barriers:  . Financial constraints . Limited social support . Lacks knowledge of: Medicaid benefits . Chronic pain effecting the patients ability to perform household chores  Clinical Social Work Clinical Goal(s):  [name] Over the next 30 days, client will work with SW to obtain a caregiver through [facility] PCS program o Not met- re-established 11/26/18 12/09/17- Over the next 45 days patient will work with SW to re-apply for Medicaid coverage  CCM SW Interventions: Completed 12/10/18  Collaboration with CCM RN Case Manager who reports patients frustration regarding Medicaid coverage  Outbound call to [place] who reports the patient does not have full Medicaid coverage but instead has coverage to pay her Medicare premium  Discussed previous information provided by the patient to CCM SW of patients effort to submit documents needed to reinstate full Medicaid benefit   CCM SW informed no notes seen in the system regarding this - DSS recommended patient re-apply  Outbound call to the patient to follow up on above noted instructions  Advised by the patient she had spoke with her personal case worker (Mrs Drake Leach) approximately 3 weeks ago who reported the patients Medicaid was  "okay" and still active  Educated the patient that if her Medicaid benefit is full coverage she would have access to a caregiver via PCS services  Obtained Medicaid ID number from the patient as well as permission to outreach [facility] to determine eligibility status  Outbound call to [place] informed the patient does not have access to the PCS benefit as her coverage is under MQB rather than full qualifying coverage  Outbound call to Mrs Drake Leach with [employer] to obtain more information on how to assist the patient in application process - left voice message requesting a return call  Patient Self Care Activities:  . Currently UNABLE TO independently perform ADL's and iADL's    Please see past updates related to this goal by clicking on the "Past Updates" button in the selected goal          Follow Up Plan: SW will follow up with patient by phone over the next 7-10 days   Daneen Schick, [place], CDP [facility] / [facility] Care Management Social Worker [phone]  Total time spent performing care coordination and/or care management activities with the patient by phone or face to face = 25 minutes.

## 2018-12-10 NOTE — Patient Instructions (Signed)
Social Worker Visit Information  Goals we discussed today:  Goals Addressed            This Visit's Progress     Patient Stated   . "I need more help" (pt-stated)   Not on track    Current Barriers:  . Financial constraints . Limited social support . Lacks knowledge of: Medicaid benefits . Chronic pain effecting the patients ability to perform household chores  Clinical Social Work Clinical Goal(s):  [name] Over the next 30 days, client will work with SW to obtain a caregiver through [facility] PCS program o Not met- re-established 11/26/18 12/09/17- Over the next 45 days patient will work with SW to re-apply for Medicaid coverage  CCM SW Interventions: Completed 12/10/18  Collaboration with CCM RN Case Manager who reports patients frustration regarding Medicaid coverage  Outbound call to [place] who reports the patient does not have full Medicaid coverage but instead has coverage to pay her Medicare premium  Discussed previous information provided by the patient to CCM SW of patients effort to submit documents needed to reinstate full Medicaid benefit   CCM SW informed no notes seen in the system regarding this - DSS recommended patient re-apply  Outbound call to the patient to follow up on above noted instructions  Advised by the patient she had spoke with her personal case worker (Mrs Drake Leach) approximately 3 weeks ago who reported the patients Medicaid was "okay" and still active  Educated the patient that if her Medicaid benefit is full coverage she would have access to a caregiver via PCS services  Obtained Medicaid ID number from the patient as well as permission to outreach [facility] to determine eligibility status  Outbound call to [place] informed the patient does not have access to the PCS benefit as her coverage is under MQB rather than full qualifying coverage  Outbound call to Mrs Drake Leach with [employer] to obtain more  information on how to assist the patient in application process - left voice message requesting a return call  Patient Self Care Activities:  . Currently UNABLE TO independently perform ADL's and iADL's    Please see past updates related to this goal by clicking on the "Past Updates" button in the selected goal           Follow Up Plan: SW will follow up with patient by phone over the next 7-10 days   Daneen Schick, [place], CDP [facility] / [place] Management Social Worker [phone]

## 2018-12-11 NOTE — Chronic Care Management (AMB) (Signed)
Chronic Care Management   Follow Up Note   12/10/2018 Name: Linda Mercado MRN: [id number] DOB: 06/21/1966  Referred by: Shelby Mattocks, PA-C Reason for referral : Chronic Care Management (CCM RN Telephone Follow Up )   Linda Mercado is a 53 y.o. year old female who is a primary care patient of [place], [name], [place]. The CCM team was consulted for assistance with chronic disease management and care coordination needs.    Review of patient status, including review of consultants reports, relevant laboratory and other test results, and collaboration with appropriate care team members and the patient's provider was performed as part of comprehensive patient evaluation and provision of chronic care management services.    I spoke with Linda Mercado by telephone today to follow up on her Neurology appointment and Chronic pain.   Goals Addressed      Patient Stated   . "I've had a couple of recent falls" (pt-stated)   Not on track    Current Barriers:  [name] Knowledge Deficits related to origin of Pain and loss of muscle control . Lacks caregiver support.   . Frequent falls secondary to pain and loss of muscle control of lower extremities  Nurse Case Manager Clinical Goal(s):  [name] Over the next 30 days, patient will not experience ED visits and or IP admissions related to falls or injury from falls. Goal Met . Over the next 60 days, patient will have a new patient appointment with a Neurologist, and or a movement disorder specialist. (target goal date received due to COVID-19 delays) Goal Met 12/10/18: Over the next 60 days patient will have increased knowledge of her treatment plan related to her pain management and impaired physical mobility and will report 100% adherence with MD recommendations.   CCM RN CM Interventions:  Completed on 12/10/18: completed with patient   Assessed for ongoing falls (pt continues to have falls with ambulation and is requiring 2 people  to assist her to the bathroom or to shower)  Discussed patient's new patient Neurology appointment; discussed Neuro's recommendations for labs and possible Rheumatology referral   Discussed current labs ordered by PA-C for next OV set for 12/24/18-pt will have Goddaughter provide transportation for this visit   Educated patient with basic disease process of auto-immune conditions and further discussed diagnosis, treatment and complications  In basket message forwarded to provider Audery Amel PA-C requesting and RA level be added to scheduled labs for 12/24/18 OV  Encouraged patient to call the CCM team if needed and confirmed patient has contact #, discussed nurse hours of availability    Scheduled telephone follow up with patient following her OV on 12/24/18 with PA-C  Patient Self Care Activities:  . Currently UNABLE TO independently participate in self care  Please see past updates related to this goal by clicking on the "Past Updates" button in the selected goal     . "My bones hurt" "The pain pump is not helping" (pt-stated)   Not on track    Current Barriers:  [name] Knowledge Deficits related to unknown etiology for Pain . Lacks caregiver support  Nurse Case Manager Clinical Goal(s):  [name] Over the next 120 days, patient will verbalize understanding of plan for evaluation of and treatment for pain. (Revised goal target date due to COVID-19 delays in care) 12/10/18: Target Goal date revised to 120 days due to COVID-19 treatment delays. AL   CCM RN CM Interventions:  Completed on 12/10/18: completed with patient    Assessed for  adherence to completing follow up with Pain doctor -pt is adhering and last OV was yesterday on 12/09/18  Discussed patient's pain medication changes from Oxycodone to Dilaudid effective 11/16/18 per patient's Pain MD  Assessed for effectiveness from new pain med - pt reports she is getting no pain relief - Pain MD is aware  Patient describes pain to be  debilitating; she feels as if her bones are dislocating; she feels as if her muscle and bones are inflamed 24/7; she describes having excruciating pain when the air or water touches her skin  Discussed patient consideration to have a second opinion from pain management; discussed patient consideration to have the pain pump removed since patient feels that her pain worsened after having the pain pump implanted many years ago  Reinforced the importance of patient keeping all scheduled MD appts and adhering to prescribed plan of care  Scheduled RNCM telephone follow up with patient following OV with provider Audery Amel PA-C set for 12/24/18 @ 11:30 AM -pt will have Goddaughter bring her to appt  In basket message forwarded to provider Audery Amel PA-C regarding patient's current pain status  Patient Self Care Activities:   Verbalizes understanding of information/education provided . Currently UNABLE TO independently perform self care   Please see past updates related to this goal by clicking on the "Past Updates" button in the selected goal         Telephone follow up appointment with CCM team member scheduled for: 12/25/18   Barb Merino, [facility] Care Management Coordinator [place] [facility]  Direct Phone: [phone]

## 2018-12-11 NOTE — Patient Instructions (Signed)
Visit Information  Goals Addressed      Patient Stated   . "I've had a couple of recent falls" (pt-stated)   Not on track    Current Barriers:  [name] Knowledge Deficits related to origin of Pain and loss of muscle control . Lacks caregiver support.   . Frequent falls secondary to pain and loss of muscle control of lower extremities  Nurse Case Manager Clinical Goal(s):  [name] Over the next 30 days, patient will not experience ED visits and or IP admissions related to falls or injury from falls. Goal Met . Over the next 60 days, patient will have a new patient appointment with a Neurologist, and or a movement disorder specialist. (target goal date received due to COVID-19 delays) Goal Met 12/10/18: Over the next 60 days patient will have increased knowledge of her treatment plan related to her pain management and impaired physical mobility and will report 100% adherence with MD recommendations.   CCM RN CM Interventions:  Completed on 12/10/18: completed with patient   Assessed for ongoing falls (pt continues to have falls with ambulation and is requiring 2 people to assist her to the bathroom or to shower)  Discussed patient's new patient Neurology appointment; discussed Neuro's recommendations for labs and possible Rheumatology referral   Discussed current labs ordered by PA-C for next OV set for 12/24/18-pt will have Goddaughter provide transportation for this visit   Educated patient with basic disease process of auto-immune conditions and further discussed diagnosis, treatment and complications  In basket message forwarded to provider Audery Amel PA-C requesting and RA level be added to scheduled labs for 12/24/18 OV  Encouraged patient to call the CCM team if needed and confirmed patient has contact #, discussed nurse hours of availability    Scheduled telephone follow up with patient following her OV on 12/24/18 with PA-C  Patient Self Care Activities:  . Currently UNABLE TO  independently participate in self care  Please see past updates related to this goal by clicking on the "Past Updates" button in the selected goal     . "My bones hurt" "The pain pump is not helping" (pt-stated)   Not on track    Current Barriers:  [name] Knowledge Deficits related to unknown etiology for Pain . Lacks caregiver support  Nurse Case Manager Clinical Goal(s):  [name] Over the next 120 days, patient will verbalize understanding of plan for evaluation of and treatment for pain. (Revised goal target date due to COVID-19 delays in care) 12/10/18: Target Goal date revised to 120 days due to COVID-19 treatment delays. AL   CCM RN CM Interventions:  Completed on 12/10/18: completed with patient    Assessed for adherence to completing follow up with Pain doctor -pt is adhering and last OV was yesterday on 12/09/18  Discussed patient's pain medication changes from Oxycodone to Dilaudid effective 11/16/18 per patient's Pain MD  Assessed for effectiveness from new pain med - pt reports she is getting no pain relief - Pain MD is aware  Patient describes pain to be debilitating; she feels as if her bones are dislocating; she feels as if her muscle and bones are inflamed 24/7; she describes having excruciating pain when the air or water touches her skin  Discussed patient consideration to have a second opinion from pain management; discussed patient consideration to have the pain pump removed since patient feels that her pain worsened after having the pain pump implanted many years ago  Reinforced the importance of patient keeping  all scheduled MD appts and adhering to prescribed plan of care  Scheduled RNCM telephone follow up with patient following OV with provider Audery Amel PA-C set for 12/24/18 @ 11:30 AM -pt will have Goddaughter bring her to appt  In basket message forwarded to provider Audery Amel PA-C regarding patient's current pain status  Patient Self Care Activities:    Verbalizes understanding of information/education provided . Currently UNABLE TO independently perform self care   Please see past updates related to this goal by clicking on the "Past Updates" button in the selected goal        The patient verbalized understanding of instructions provided today and declined a print copy of patient instruction materials.   Telephone follow up appointment with CCM team member scheduled for: 12/25/18  Barb Merino, [facility] Care Management Coordinator [place] [facility]  Direct Phone: [phone]

## 2018-12-18 ENCOUNTER — Ambulatory Visit: Payer: Medicare Other

## 2018-12-18 DIAGNOSIS — R296 Repeated falls: Secondary | ICD-10-CM

## 2018-12-18 DIAGNOSIS — E119 Type 2 diabetes mellitus without complications: Secondary | ICD-10-CM

## 2018-12-18 DIAGNOSIS — M5416 Radiculopathy, lumbar region: Secondary | ICD-10-CM

## 2018-12-18 NOTE — Chronic Care Management (AMB) (Signed)
Chronic Care Management    Clinical Social Work Follow Up Note  12/18/2018 Name: Linda Mercado MRN: [id number] DOB: 06-24-1966  Linda Mercado is a 53 y.o. year old female who is a primary care patient of [place], [name], [place]. The CCM team was consulted for assistance with [employer].   Review of patient status, including review of consultants reports, other relevant assessments, and collaboration with appropriate care team members and the patient's provider was performed as part of comprehensive patient evaluation and provision of chronic care management services.     I placed a follow up call to the patient on today's date to assess the status of below patient stated goals.  Goals Addressed            This Visit's Progress     Patient Stated   . "I need more help" (pt-stated)   On track    Current Barriers:  . Financial constraints . Limited social support . Lacks knowledge of: Medicaid benefits . Chronic pain effecting the patients ability to perform household chores  Clinical Social Work Clinical Goal(s):  [name] Over the next 30 days, client will work with SW to obtain a caregiver through [facility] PCS program o Not met- re-established 11/26/18 12/09/17- Over the next 45 days patient will work with SW to re-apply for Medicaid coverage  CCM SW Interventions: Completed 12/18/18  Outbound call to the patient to provide information gathered from previous call to [place] the patient her current Medicaid coverage does not offer the PCS benefit  Communicated to the patient that CCM SW has yet to receive a return call from her Medicaid case worker  Determined the patient received a voice message from her Medicaid caseworker "yesterday" requesting a return call  Encouraged the patient to return her case workers call as soon as possible to determine if the patients Medicaid will be revised to full coverage after submitting financial documents   Advised the patient that if she in in fact eligible for full Medicaid coverage, CCM SW could assist with a PCS application  Scheduled follow up call to the patient within the next 1-2 weeks to determined outcome of Medicaid decision  Patient Self Care Activities:  . Currently UNABLE TO independently perform ADL's and iADL's    Please see past updates related to this goal by clicking on the "Past Updates" button in the selected goal      . "I want help installing a ramp" (pt-stated)   Not on track    Current Barriers:  . Financial constraints . Limited social support  . Physical pain  Clinical Social Work Clinical Goal(s):  [name] Over the next 45 days, patient will work with SW to identify resources to assist with ramp installation o Not Met- Re-established goal on 12/18/18  Interventions:  E-mail correspondence follow up with [employer] [facility]) President requesting assistance with patient care needs - awaiting response on status of referral  Outbound call to the patient to update on the status of referral  Explained to the patient the length of time it can take to receive home modifications due to high demand   Patient Self Care Activities:  . The patient reports she currently has a metal ramp but is unable to install on her own  Please see past updates related to this goal by clicking on the "Past Updates" button in the selected goal        Follow Up Plan: SW will follow up  with patient by phone over the next two weeks.   Daneen Schick, BSW, CDP Social Worker, Certified Dementia Practitioner [place] / [place] [facility] [phone]  Total time spent performing care coordination and/or care management activities with the patient by phone or face to face = 12 minutes.

## 2018-12-18 NOTE — Patient Instructions (Signed)
Social Worker Visit Information  Goals we discussed today:  Goals Addressed            This Visit's Progress     Patient Stated   . "I need more help" (pt-stated)   On track    Current Barriers:  . Financial constraints . Limited social support . Lacks knowledge of: Medicaid benefits . Chronic pain effecting the patients ability to perform household chores  Clinical Social Work Clinical Goal(s):  [name] Over the next 30 days, client will work with SW to obtain a caregiver through [facility] PCS program o Not met- re-established 11/26/18 12/09/17- Over the next 45 days patient will work with SW to re-apply for Medicaid coverage  CCM SW Interventions: Completed 12/18/18  Outbound call to the patient to provide information gathered from previous call to [place] the patient her current Medicaid coverage does not offer the PCS benefit  Communicated to the patient that CCM SW has yet to receive a return call from her Medicaid case worker  Determined the patient received a voice message from her Medicaid caseworker "yesterday" requesting a return call  Encouraged the patient to return her case workers call as soon as possible to determine if the patients Medicaid will be revised to full coverage after submitting financial documents  Advised the patient that if she in in fact eligible for full Medicaid coverage, CCM SW could assist with a PCS application  Scheduled follow up call to the patient within the next 1-2 weeks to determined outcome of Medicaid decision  Patient Self Care Activities:  . Currently UNABLE TO independently perform ADL's and iADL's    Please see past updates related to this goal by clicking on the "Past Updates" button in the selected goal      . "I want help installing a ramp" (pt-stated)   Not on track    Current Barriers:  . Financial constraints . Limited social support  . Physical pain  Clinical Social Work Clinical Goal(s):  [name] Over  the next 45 days, patient will work with SW to identify resources to assist with ramp installation o Not Met- Re-established goal on 12/18/18  Interventions:  E-mail correspondence follow up with [employer] [facility] President requesting assistance with patient care needs - awaiting response on status of referral  Outbound call to the patient to update on the status of referral  Explained to the patient the length of time it can take to receive home modifications due to high demand   Patient Self Care Activities:  . The patient reports she currently has a metal ramp but is unable to install on her own  Please see past updates related to this goal by clicking on the "Past Updates" button in the selected goal          Materials Provided: Verbal education about community resources provided by phone  Follow Up Plan: SW will follow up with patient by phone over the next two weeks   Daneen Schick, BSW, CDP Social Worker, Certified Dementia Practitioner [place] [facility] [place] [facility] [phone]

## 2018-12-21 ENCOUNTER — Ambulatory Visit: Payer: Medicare Other | Admitting: Diagnostic Neuroimaging

## 2018-12-22 ENCOUNTER — Ambulatory Visit: Payer: Medicare Other | Admitting: Internal Medicine

## 2018-12-22 ENCOUNTER — Other Ambulatory Visit: Payer: Self-pay | Admitting: Nurse Practitioner

## 2018-12-22 MED ORDER — ALBUTEROL SULFATE HFA 108 (90 BASE) MCG/ACT IN AERS: 2.0000 | INHALATION_SPRAY | Freq: Four times a day (QID) | RESPIRATORY_TRACT | 2 refills | Status: DC | PRN

## 2018-12-24 ENCOUNTER — Ambulatory Visit: Payer: Medicare Other | Admitting: Internal Medicine

## 2018-12-25 ENCOUNTER — Ambulatory Visit: Payer: Self-pay

## 2018-12-25 ENCOUNTER — Ambulatory Visit (INDEPENDENT_AMBULATORY_CARE_PROVIDER_SITE_OTHER): Payer: Medicare Other

## 2018-12-25 ENCOUNTER — Telehealth: Payer: Medicare Other

## 2018-12-25 DIAGNOSIS — M5416 Radiculopathy, lumbar region: Secondary | ICD-10-CM

## 2018-12-25 DIAGNOSIS — G8929 Other chronic pain: Secondary | ICD-10-CM

## 2018-12-25 DIAGNOSIS — E119 Type 2 diabetes mellitus without complications: Secondary | ICD-10-CM

## 2018-12-25 DIAGNOSIS — M5412 Radiculopathy, cervical region: Secondary | ICD-10-CM

## 2018-12-25 DIAGNOSIS — I10 Essential (primary) hypertension: Secondary | ICD-10-CM

## 2018-12-25 DIAGNOSIS — R296 Repeated falls: Secondary | ICD-10-CM

## 2018-12-25 DIAGNOSIS — M791 Myalgia, unspecified site: Secondary | ICD-10-CM

## 2018-12-25 NOTE — Patient Instructions (Signed)
Social Worker Visit Information  Goals we discussed today:  Goals Addressed            This Visit's Progress     Patient Stated   . "I need more help" (pt-stated)       Current Barriers:  . Financial constraints . Limited social support . Lacks knowledge of: Medicaid benefits . Chronic pain effecting the patients ability to perform household chores  Clinical Social Work Clinical Goal(s):  [name] Over the next 30 days, client will work with SW to obtain a caregiver through [facility] PCS program o Not met- re-established 11/26/18 12/09/17- Over the next 45 days patient will work with SW to re-apply for Medicaid coverage  CCM SW Interventions: Completed 12/25/18  Outbound call to the patient to assess progression of patient stated goal  Determined the patient has yet to receive a return call from her Medicaid worker Mrs Drake Leach [phone])  Encouraged the patient to contact Mrs Drake Leach again to determine Medicaid coverage  Advised the patient to update CCM SW with outcome of call to determine if/when a PCS application can be submitted  Patient Self Care Activities:  . Currently UNABLE TO independently perform ADL's and iADL's    Please see past updates related to this goal by clicking on the "Past Updates" button in the selected goal      . COMPLETED: "I want help installing a ramp" (pt-stated)       Current Barriers:  . Financial constraints . Limited social support  . Physical pain  Clinical Social Work Clinical Goal(s):  [name] Over the next 45 days, patient will work with SW to identify resources to assist with ramp installation o Not Met- Re-established goal on 12/18/18  Interventions:  Collaboration with [place] who reports the patient does not qualify for assistance through [employer] due to renting instead of owning her home  Outbound call to the patient to discuss alternate referral options  Determined the patient does not want to pursue  ramp installation at this time due to concerns of COVID 19. CCM SW will place goal on hole at this time until the patient is open to having assistance in her home  Patient Self Care Activities:  . The patient reports she currently has a metal ramp but is unable to install on her own  Please see past updates related to this goal by clicking on the "Past Updates" button in the selected goal        Other   . COMPLETED: "I want to apply for food stamps"       Current Barriers:  . Limited social support  . Limited access to a computer . Lacks knowledge of assistance programs and changes in response to COVID 19  Clinical Social Work Clinical Goal(s):  [name] Over the next 30 days, client will follow up with [employer] as directed by SW  Interventions: . Outbound call to the patient to confirm receipt of mailed resource . Determined the patient has completed the application but has yet to submit . Informed by the patient she has received a small amount of benefits due to her children receiving free/reduced lunch while in school . Encouraged the patient to submit application for increased benefit amount  Patient Self Care Activities:  . Calls provider office for new concerns or questions  . States understanding of information provided during today's call  Please see past updates related to this goal by clicking on the "Past Updates"  button in the selected goal           Follow Up Plan: SW will follow up with patient by phone over the next 3-4 weeks   Daneen Schick, BSW, CDP Social Worker, Certified Dementia Practitioner [place] / [place] [facility] [phone]

## 2018-12-25 NOTE — Chronic Care Management (AMB) (Signed)
Chronic Care Management    Clinical Social Work Follow Up Note  12/25/2018 Name: Linda Mercado MRN: [id number] DOB: [birth year]  Linda Mercado is a 53 y.o. year old female who is a primary care patient of Linda Mercado, Linda Mercado, Linda Mercado. The CCM team was consulted for assistance with Linda Mercado.   Review of patient status, including review of consultants reports, other relevant assessments, and collaboration with appropriate care team members and the patient's provider was performed as part of comprehensive patient evaluation and provision of chronic care management services.     Goals Addressed            This Visit's Progress     Patient Stated   . "I need more help" (pt-stated)       Current Barriers:  . Financial constraints . Limited social support . Lacks knowledge of: Medicaid benefits . Chronic pain effecting the patients ability to perform household chores  Clinical Social Work Clinical Goal(s):  [name] Over the next 30 days, client will work with SW to obtain a caregiver through [facility] PCS program o Not met- re-established 11/26/18 12/09/17- Over the next 45 days patient will work with SW to re-apply for Medicaid coverage  CCM SW Interventions: Completed 12/25/18  Outbound call to the patient to assess progression of patient stated goal  Determined the patient has yet to receive a return call from her Medicaid worker Linda Mercado [phone])  Encouraged the patient to contact Linda Mercado again to determine Medicaid coverage  Advised the patient to update CCM SW with outcome of call to determine if/when a PCS application can be submitted  Patient Self Care Activities:  . Currently UNABLE TO independently perform ADL's and iADL's    Please see past updates related to this goal by clicking on the "Past Updates" button in the selected goal      . COMPLETED: "I want help installing a ramp" (pt-stated) Goal not met; patient requested to put on "hold"        Current Barriers:  . Financial constraints . Limited social support  . Physical pain  Clinical Social Work Clinical Goal(s):  [name] Over the next 45 days, patient will work with SW to identify resources to assist with ramp installation o Not Met- Re-established goal on 12/18/18  Interventions:  Collaboration with [place] who reports the patient does not qualify for assistance through [employer] due to renting instead of owning her home  Outbound call to the patient to discuss alternate referral options  Determined the patient does not want to pursue ramp installation at this time due to concerns of COVID 19. CCM SW will place goal on hole at this time until the patient is open to having assistance in her home  Patient Self Care Activities:  . The patient reports she currently has a metal ramp but is unable to install on her own  Please see past updates related to this goal by clicking on the "Past Updates" button in the selected goal        Other   . COMPLETED: "I want to apply for food stamps"       Current Barriers:  . Limited social support  . Limited access to a computer . Lacks knowledge of assistance programs and changes in response to COVID 19  Clinical Social Work Clinical Goal(s):  [name] Over the next 30 days, client will follow up with [employer] as directed by SW  Interventions:  Outbound call  to the patient to confirm receipt of mailed resource  Determined the patient has completed the application but has yet to submit  Informed by the patient she has received a small amount of benefits due to her children receiving free/reduced lunch while in school  Encouraged the patient to submit application for increased benefit amount  Patient Self Care Activities:  . Calls provider office for new concerns or questions  . States understanding of information provided during today's call  Please see past updates related to this goal  by clicking on the "Past Updates" button in the selected goal          Follow Up Plan: SW will follow up with patient by phone over the next 3-4 weeks   Daneen Schick, BSW, CDP Social Worker, Certified Dementia Practitioner [place] / [place] [facility] [phone]  Total time spent performing care coordination and/or care management activities with the patient by phone or face to face = 35 minutes.

## 2018-12-25 NOTE — Chronic Care Management (AMB) (Signed)
  Chronic Care Management   Outreach Note  12/25/2018 Name: Linda Mercado MRN: [id number] DOB: 1966/07/11  Referred by: Shelby Mattocks, PA-C Reason for referral : Chronic Care Management (CCM RNCM Telephone Follow Up )   An unsuccessful telephone outreach was attempted today. The patient was referred to the case management team by Laurita Quint for assistance with chronic care management and care coordination.   Follow Up Plan: The care management team will reach out to the patient again over the next 5-7 days.    Barb Merino, RN,CCM Care Management Coordinator [place] [facility]  Direct Phone: [phone]

## 2018-12-30 ENCOUNTER — Other Ambulatory Visit: Payer: Self-pay

## 2018-12-30 DIAGNOSIS — E785 Hyperlipidemia, unspecified: Secondary | ICD-10-CM

## 2018-12-30 MED ORDER — ATORVASTATIN CALCIUM 20 MG PO TABS: 20.0000 mg | ORAL_TABLET | Freq: Every day | ORAL | 0 refills | Status: DC

## 2019-01-01 ENCOUNTER — Telehealth: Payer: Medicare Other

## 2019-01-02 ENCOUNTER — Other Ambulatory Visit: Payer: Self-pay | Admitting: Nurse Practitioner

## 2019-01-02 ENCOUNTER — Other Ambulatory Visit: Payer: Self-pay | Admitting: Internal Medicine

## 2019-01-02 DIAGNOSIS — E559 Vitamin D deficiency, unspecified: Secondary | ICD-10-CM

## 2019-01-04 NOTE — Telephone Encounter (Signed)
Trazodone refill 

## 2019-01-14 ENCOUNTER — Telehealth: Payer: Medicare Other

## 2019-01-18 ENCOUNTER — Ambulatory Visit: Payer: Self-pay

## 2019-01-18 ENCOUNTER — Telehealth: Payer: Medicare Other

## 2019-01-18 DIAGNOSIS — M5416 Radiculopathy, lumbar region: Secondary | ICD-10-CM

## 2019-01-18 DIAGNOSIS — E119 Type 2 diabetes mellitus without complications: Secondary | ICD-10-CM

## 2019-01-18 NOTE — Chronic Care Management (AMB) (Signed)
  Chronic Care Management   Outreach Note  01/18/2019 Name: Linda Mercado MRN: [id number] DOB: [birth year]  Referred by: Shelby Mattocks, PA-C Reason for referral : Care Coordination   An unsuccessful telephone outreach was attempted today in order to assess outcome of patients Medicaid coverage. CCM SW left a HIPAA compliant voice message requesting a return call.   Follow Up Plan: The care management team will reach out to the patient again over the next 10-14 days.   Daneen Schick, BSW, CDP Social Worker, Certified Dementia Practitioner [place] / [place] [facility] [phone]  Total time spent performing care coordination and/or care management activities with the patient by phone or face to face = 5 minutes.

## 2019-01-26 ENCOUNTER — Ambulatory Visit: Payer: Self-pay

## 2019-01-26 ENCOUNTER — Telehealth: Payer: Medicare Other

## 2019-01-26 DIAGNOSIS — I10 Essential (primary) hypertension: Secondary | ICD-10-CM

## 2019-01-26 DIAGNOSIS — M5412 Radiculopathy, cervical region: Secondary | ICD-10-CM

## 2019-01-26 DIAGNOSIS — G8929 Other chronic pain: Secondary | ICD-10-CM

## 2019-01-26 DIAGNOSIS — M5416 Radiculopathy, lumbar region: Secondary | ICD-10-CM

## 2019-01-26 DIAGNOSIS — E119 Type 2 diabetes mellitus without complications: Secondary | ICD-10-CM

## 2019-01-26 NOTE — Chronic Care Management (AMB) (Signed)
  Chronic Care Management   Outreach Note  01/26/2019 Name: Linda Mercado MRN: [id number] DOB: [birth year]  Referred by: Shelby Mattocks, PA-C Reason for referral : Chronic Care Management (CCM RNCM Telephone Follow up )   An unsuccessful telephone outreach was attempted today. The patient was referred to the case management team by Laurita Quint for assistance with chronic care management and care coordination.   Follow Up Plan: The care management team will reach out to the patient again over the next 7-14 days.   Barb Merino, RN, BSN, CCM Care Management Coordinator [place] [facility]  Direct Phone: [phone]

## 2019-01-28 ENCOUNTER — Ambulatory Visit: Payer: Self-pay

## 2019-01-28 ENCOUNTER — Telehealth: Payer: Self-pay

## 2019-01-28 DIAGNOSIS — R296 Repeated falls: Secondary | ICD-10-CM

## 2019-01-28 DIAGNOSIS — M5416 Radiculopathy, lumbar region: Secondary | ICD-10-CM

## 2019-01-28 NOTE — Chronic Care Management (AMB) (Signed)
  Chronic Care Management   Outreach Note  01/28/2019 Name: BRIARROSE SHOR MRN: [id number] DOB: 07/17/1966  Referred by: Shelby Mattocks, PA-C Reason for referral : Care Coordination   A second unsuccessful telephone outreach was attempted today. The patient was referred to the case management team for assistance with chronic care management and care coordination.   Follow Up Plan: A HIPPA compliant phone message was left for the patient providing contact information and requesting a return call.  The care management team will reach out to the patient again over the next 14 days.   Daneen Schick, BSW, CDP Social Worker, Certified Dementia Practitioner [place] / [place] [facility] [phone]  Total time spent performing care coordination and/or care management activities with the patient by phone or face to face = 5 minutes.

## 2019-02-01 ENCOUNTER — Other Ambulatory Visit: Payer: Self-pay | Admitting: Internal Medicine

## 2019-02-01 DIAGNOSIS — E785 Hyperlipidemia, unspecified: Secondary | ICD-10-CM

## 2019-02-05 ENCOUNTER — Telehealth: Payer: Self-pay

## 2019-02-05 ENCOUNTER — Ambulatory Visit: Payer: Self-pay

## 2019-02-05 DIAGNOSIS — M5416 Radiculopathy, lumbar region: Secondary | ICD-10-CM

## 2019-02-05 DIAGNOSIS — R296 Repeated falls: Secondary | ICD-10-CM

## 2019-02-05 DIAGNOSIS — E119 Type 2 diabetes mellitus without complications: Secondary | ICD-10-CM

## 2019-02-05 NOTE — Chronic Care Management (AMB) (Signed)
  Chronic Care Management   Social Work Follow Up Note  02/05/2019 Name: Linda Mercado MRN: [id number] DOB: 12/02/1965  Linda Mercado is a 53 y.o. year old female who is a primary care patient of [place], [name], [place]. The CCM team was consulted for assistance with [employer].     Third unsuccessful outreach to the patient to assist with progression of patient stated goals. The below goals are being marked as complete due to SW inability to assess for progress.    Goals Addressed            This Visit's Progress     Patient Stated   . COMPLETED: "I am really depressed" (pt-stated)       Current Barriers:  . Limited social support . ADL IADL limitations . Social Isolation  . Chronic Pain  Clinical Social Work Clinical Goal(s):  [name] Over the next 35 days, client will follow up with therapist as directed by SW  Interventions: . Patient interviewed and appropriate assessments performed . Discussed plans with patient for ongoing care management follow up and provided patient with direct contact information for care management team . Advised patient to contact her therapist and physician if recent medication adjustment is ineffective  . Collaboration with CCM RN Case Manager regarding patient identified depression . Encouraged the patient to continue speaking with family and spending time outside to assist with mood . Developed plan for future outreach   Patient Self Care Activities:  . Self administers medications as prescribed . Attends all scheduled provider appointments . Calls provider office for new concerns or questions  Initial goal documentation     . COMPLETED: "I need more help" (pt-stated)       Current Barriers:  . Financial constraints . Limited social support . Lacks knowledge of: Medicaid benefits . Chronic pain effecting the patients ability to perform household chores  Clinical Social Work Clinical Goal(s):  [name] Over the next 30 days,  client will work with SW to obtain a caregiver through [facility] PCS program o Not met- re-established 11/26/18 12/09/17- Over the next 45 days patient will work with SW to re-apply for Medicaid coverage  CCM SW Interventions: Completed 12/25/18  Outbound call to the patient to assess progression of patient stated goal  Determined the patient has yet to receive a return call from her Medicaid worker Mrs Drake Leach [phone])  Encouraged the patient to contact Mrs Drake Leach again to determine Medicaid coverage  Advised the patient to update CCM SW with outcome of call to determine if/when a PCS application can be submitted  Patient Self Care Activities:  . Currently UNABLE TO independently perform ADL's and iADL's    Please see past updates related to this goal by clicking on the "Past Updates" button in the selected goal          Follow Up Plan: No planned follow up by CCM SW planned due to an inability to maintain contact with the patient. CCM SW will re-establish a plan of care with the patient if/when she returns this writers call.   Daneen Schick, BSW, CDP Social Worker, Certified Dementia Practitioner [place] / [place] [facility] [phone]  Total time spent performing care coordination and/or care management activities with the patient by phone or face to face = 5 minutes.

## 2019-02-09 ENCOUNTER — Telehealth: Payer: Self-pay

## 2019-02-09 ENCOUNTER — Ambulatory Visit: Payer: Self-pay

## 2019-02-09 ENCOUNTER — Telehealth: Payer: Medicare Other

## 2019-02-09 ENCOUNTER — Other Ambulatory Visit: Payer: Self-pay

## 2019-02-09 DIAGNOSIS — G8929 Other chronic pain: Secondary | ICD-10-CM

## 2019-02-09 DIAGNOSIS — E119 Type 2 diabetes mellitus without complications: Secondary | ICD-10-CM

## 2019-02-09 DIAGNOSIS — M5412 Radiculopathy, cervical region: Secondary | ICD-10-CM

## 2019-02-09 DIAGNOSIS — M791 Myalgia, unspecified site: Secondary | ICD-10-CM

## 2019-02-09 DIAGNOSIS — M5416 Radiculopathy, lumbar region: Secondary | ICD-10-CM

## 2019-02-09 DIAGNOSIS — I10 Essential (primary) hypertension: Secondary | ICD-10-CM

## 2019-02-10 NOTE — Chronic Care Management (AMB) (Signed)
Chronic Care Management   Follow Up Note   02/09/2019 Name: Linda Mercado MRN: [id number] DOB: 1966/06/30  Referred by: Linda Mattocks, PA-C Reason for referral : Chronic Care Management (CCM RNCM Telephone Follow up   )   Linda Mercado is a 53 y.o. year old female who is a primary care patient of [place], [name], [place]. The CCM team was consulted for assistance with chronic disease management and care coordination needs.    Review of patient status, including review of consultants reports, relevant laboratory and other test results, and collaboration with appropriate care team members and the patient's provider was performed as part of comprehensive patient evaluation and provision of chronic care management services.    I spoke with Linda Mercado by telephone today for a CCM f/u on her chronic pain. I assisted Linda Mercado with scheduling an OV with provider Linda Amel, PA-C to evaluate her c/o persistent severe pain and inflammation.   Goals Addressed      Patient Stated   . "My bones hurt" "The pain pump is not helping" (pt-stated)       Current Barriers:  [name] Knowledge Deficits related to unknown etiology for chronic debilitating musculoskeletal and bone Pain  . Lacks caregiver support  Nurse Case Manager Clinical Goal(s):  [name] Over the next 120 days, patient will verbalize understanding of plan for evaluation of and treatment for pain. (Revised goal target date due to COVID-19 delays in care) 12/10/18: Target Goal date revised to 120 days due to COVID-19 treatment delays. AL   CCM RN CM Interventions:  02/09/19 call completed with patient   . Evaluation of current treatment plan related to chronic pain management and patient's adherence to plan as established by provider. . Provided education to patient re: disease process for autoimmune disorders; discussed cause and effect; discussed common autoimmune disorders, treatment and diagnosis; discussed when  to see a Specialist; informed patient that provider Linda Amel PA-C has placed an order for labs to evaluate some of the autoimmune markers looked at for suspected autoimmune conditions and for consideration of sending patient to a Rheumatologist . Reviewed medications with patient and discussed patient's ongoing follow up with her Pain management doctor; discussed recently added intrathecal meds including Clonidine and Gabapentin per patient; patient reports ineffectiveness from this regimen and continues to be mostly bed bound due to having severe debilitating pain   . Collaborated with provider Linda Amel PA-C via in basket message regarding patient's persistent chronic pain and reports of ineffective pain management and decreased mobility due to pain and weakness; requested this patient be evaluated for Polymyositis and or other musculoskeletal autoimmune conditions; requested this patient be referred to a Rheumatologist promptly for further evaluation; requested provider consider prescribing a steroid dose pack to evaluate for effectiveness and decrease in inflammation/pain - advised patient has not been prescribed steroids throughout the entire duration of her c/o of pain and inflammation that she reports initially started in October of 2019 - advised I spoke with the patient about starting steroids if prescribed and she is agreeable . Discussed plans with patient for ongoing care management follow up and provided patient with direct contact information for care management team . Reviewed scheduled/upcoming provider appointments including: OV scheduled with provider Linda Amel PA-C for 02/11/19 @3 :30 PM, discussed patient should arrive 15 min early for this appointment; discussed her next f/u with Pain management is also set for 02/11/19 at 12 noon  Patient Self Care Activities:   Verbalizes understanding of information/education  provided . Currently UNABLE TO independently perform  self care   Please see past updates related to this goal by clicking on the "Past Updates" button in the selected goal           Telephone follow up appointment with care management team member scheduled for: 02/19/19   Linda Merino, RN, BSN, CCM Care Management Coordinator [facility]  Direct Phone: [phone]

## 2019-02-10 NOTE — Patient Instructions (Signed)
Visit Information  Goals Addressed      Patient Stated   . "My bones hurt" "The pain pump is not helping" (pt-stated)       Current Barriers:  [name] Knowledge Deficits related to unknown etiology for chronic debilitating musculoskeletal and bone Pain  . Lacks caregiver support  Nurse Case Manager Clinical Goal(s):  [name] Over the next 120 days, patient will verbalize understanding of plan for evaluation of and treatment for pain. (Revised goal target date due to COVID-19 delays in care) 12/10/18: Target Goal date revised to 120 days due to COVID-19 treatment delays. AL   CCM RN CM Interventions:  02/09/19 call completed with patient   . Evaluation of current treatment plan related to chronic pain management and patient's adherence to plan as established by provider. . Provided education to patient re: disease process for autoimmune disorders; discussed cause and effect; discussed common autoimmune disorders, treatment and diagnosis; discussed when to see a Specialist; informed patient that provider Audery Amel PA-C has placed an order for labs to evaluate some of the autoimmune markers looked at for suspected autoimmune conditions and for consideration of sending patient to a Rheumatologist . Reviewed medications with patient and discussed patient's ongoing follow up with her Pain management doctor; discussed recently added intrathecal meds including Clonidine and Gabapentin per patient; patient reports ineffectiveness from this regimen and continues to be mostly bed bound due to having severe debilitating pain   . Collaborated with provider Audery Amel PA-C via in basket message regarding patient's persistent chronic pain and reports of ineffective pain management and decreased mobility due to pain and weakness; requested this patient be evaluated for Polymyositis and or other musculoskeletal autoimmune conditions; requested this patient be referred to a Rheumatologist promptly for further  evaluation; requested provider consider prescribing a steroid dose pack to evaluate for effectiveness and decrease in inflammation/pain - advised patient has not been prescribed steroids throughout the entire duration of her c/o of pain and inflammation that she reports initially started in October of 2019 - advised I spoke with the patient about starting steroids if prescribed and she is agreeable . Discussed plans with patient for ongoing care management follow up and provided patient with direct contact information for care management team . Reviewed scheduled/upcoming provider appointments including: OV scheduled with provider Audery Amel PA-C for 02/11/19 @3 :30 PM, discussed patient should arrive 15 min early for this appointment; discussed her next f/u with Pain management is also set for 02/11/19 at 12 noon  Patient Self Care Activities:   Verbalizes understanding of information/education provided . Currently UNABLE TO independently perform self care   Please see past updates related to this goal by clicking on the "Past Updates" button in the selected goal        The patient verbalized understanding of instructions provided today and declined a print copy of patient instruction materials.   Telephone follow up appointment with care management team member scheduled for: 02/19/19  Barb Merino, RN, BSN, CCM Care Management Coordinator [facility]  Direct Phone: [phone]

## 2019-02-11 ENCOUNTER — Ambulatory Visit: Payer: Medicare Other | Admitting: Internal Medicine

## 2019-02-17 ENCOUNTER — Other Ambulatory Visit: Payer: Self-pay

## 2019-02-17 MED ORDER — NORGESTIM-ETH ESTRAD TRIPHASIC 0.18/0.215/0.25 MG-35 MCG PO TABS: 1.0000 | ORAL_TABLET | Freq: Every day | ORAL | 1 refills | Status: DC

## 2019-02-18 ENCOUNTER — Other Ambulatory Visit: Payer: Self-pay

## 2019-02-18 ENCOUNTER — Encounter: Payer: Self-pay | Admitting: Internal Medicine

## 2019-02-18 ENCOUNTER — Ambulatory Visit (INDEPENDENT_AMBULATORY_CARE_PROVIDER_SITE_OTHER): Payer: Medicare Other | Admitting: Internal Medicine

## 2019-02-18 VITALS — BP 130/78 | HR 66 | Temp 98.3°F | Ht 64.2 in | Wt 154.8 lb

## 2019-02-18 DIAGNOSIS — F250 Schizoaffective disorder, bipolar type: Secondary | ICD-10-CM

## 2019-02-18 DIAGNOSIS — I10 Essential (primary) hypertension: Secondary | ICD-10-CM | POA: Diagnosis not present

## 2019-02-18 DIAGNOSIS — G8929 Other chronic pain: Secondary | ICD-10-CM | POA: Diagnosis not present

## 2019-02-18 DIAGNOSIS — R5382 Chronic fatigue, unspecified: Secondary | ICD-10-CM

## 2019-02-18 DIAGNOSIS — E119 Type 2 diabetes mellitus without complications: Secondary | ICD-10-CM

## 2019-02-18 DIAGNOSIS — M791 Myalgia, unspecified site: Secondary | ICD-10-CM | POA: Diagnosis not present

## 2019-02-18 DIAGNOSIS — R635 Abnormal weight gain: Secondary | ICD-10-CM

## 2019-02-18 DIAGNOSIS — M2550 Pain in unspecified joint: Secondary | ICD-10-CM | POA: Diagnosis not present

## 2019-02-18 MED ORDER — NORGESTIM-ETH ESTRAD TRIPHASIC 0.18/0.215/0.25 MG-35 MCG PO TABS: 1.0000 | ORAL_TABLET | Freq: Every day | ORAL | 0 refills | Status: DC

## 2019-02-18 MED ORDER — TRAZODONE HCL 100 MG PO TABS: ORAL_TABLET | ORAL | 0 refills | Status: DC

## 2019-02-18 MED ORDER — ALBUTEROL SULFATE HFA 108 (90 BASE) MCG/ACT IN AERS: 2.0000 | INHALATION_SPRAY | Freq: Four times a day (QID) | RESPIRATORY_TRACT | 1 refills | Status: DC | PRN

## 2019-02-18 MED ORDER — DICLOFENAC SODIUM 1 % TD GEL: 4.0000 g | Freq: Four times a day (QID) | TRANSDERMAL | 2 refills | Status: DC

## 2019-02-18 MED ORDER — TRIAMCINOLONE ACETONIDE 0.1 % EX CREA: 1.0000 "application " | TOPICAL_CREAM | Freq: Every day | CUTANEOUS | 2 refills | Status: DC | PRN

## 2019-02-18 NOTE — Progress Notes (Signed)
Subjective:     Patient ID: Linda Mercado , female    DOB: 17-May-1966 , 53 y.o.   MRN: [id number]   Chief Complaint  Patient presents with  . Pain    inflammation    HPI  1-Pt is here for visit to do labs to look for inflammation which the RN from CCM is concerned about since pt continues having pain all over and is on high dose of opiates in her pump. Has been having deep bone pain about 3 months after placement of new pump which she has. Pain is described "sharp, like a bone is braking" Pain is constants and there are some days she has bad flairs.  Has been in bed for 5 day due to increased pain all over and her daughter's have had to feed her and help her with ADL;s.   Had a new medication added in her pump  last week and was told wait a couple of weeks. So far she cant tell her pain is any better. Food diary:  She does not eat much in portions. Breakfast- herbal/ protein shake, fruits after 11 p LUNCH- skip Dinner- fruit, occasional chicken, corn vegetable, mac and cheese, brown raice, mash potatoes LIVING CONDITIONS: she lives in a double wide which is just 53 years old and there is no hx of water damage to this. She lives with her 2 daughters and her dog.  Denies eating junk food, on occasion will eat chips or peppermint candy 2- needs asthma  med  And birth control refilled. She missed her pap visit due to Covid, but willing to come back to do this.  3- would like voltaren gel rx refilled, which was prescribed by ER which helps her pain.    Past Medical History:  Diagnosis Date  . Bradycardia   . Chronic back pain   . Diabetes mellitus without complication ([place])   . Hypertension   . Hypokalemia      Family History  Problem Relation Age of Onset  . Diabetes Father   . Hypertension Father   . Diabetes Other   . Hypertension Other   . Stroke Neg Hx   . Cancer Neg Hx   . CAD Neg Hx      Current Outpatient Medications:  .  acetaminophen (TYLENOL) 500 MG tablet,  Take 2 tablets (1,000 mg total) by mouth 3 (three) times daily at 8am, 2pm and bedtime., Disp: 30 tablet, Rfl: 0 .  albuterol (VENTOLIN HFA) 108 (90 Base) MCG/ACT inhaler, Inhale 2 puffs into the lungs every 6 (six) hours as needed for wheezing or shortness of breath., Disp: 1 Inhaler, Rfl: 2 .  AMITIZA 24 MCG capsule, 24 mcg 2 (two) times daily., Disp: , Rfl:  .  atenolol (TENORMIN) 100 MG tablet, TAKE 1 TABLET(100 MG) BY MOUTH DAILY, Disp: 30 tablet, Rfl: 1 .  atorvastatin (LIPITOR) 20 MG tablet, Take 1 tablet (20 mg total) by mouth at bedtime., Disp: 90 tablet, Rfl: 0 .  calcium gluconate 500 MG tablet, Take one Monday through Friday only., Disp: 90 tablet, Rfl: 0 .  clonazePAM (KLONOPIN) 1 MG tablet, Take 1 mg by mouth 3 (three) times daily., Disp: , Rfl:  .  Cyanocobalamin (B-12) 500 MCG SUBL, One SL QD, Disp: 90 tablet, Rfl: 1 .  dexlansoprazole (DEXILANT) 60 MG capsule, Take 1 capsule (60 mg total) by mouth daily., Disp: 90 capsule, Rfl: 1 .  diclofenac sodium (VOLTAREN) 1 % GEL, Apply 4 g topically 4 (four)  times daily as needed for pain., Disp: , Rfl:  .  divalproex (DEPAKOTE ER) 500 MG 24 hr tablet, Take 1,000 mg by mouth daily. , Disp: , Rfl: 1 .  docusate sodium (COLACE) 50 MG capsule, Take 50 mg by mouth at bedtime., Disp: , Rfl:  .  etodolac (LODINE) 400 MG tablet, Take 400 mg by mouth 2 (two) times daily., Disp: , Rfl:  .  fluticasone (FLOVENT HFA) 110 MCG/ACT inhaler, Inhale 2 puffs into the lungs every 12 (twelve) hours., Disp: , Rfl:  .  FREESTYLE LITE test strip, CHECK BLOOD SUGAR 3 TO 4 TIMES DAILY, Disp: 150 each, Rfl: 11 .  hydrochlorothiazide (HYDRODIURIL) 12.5 MG tablet, Take 1 tablet by mouth once daily., Disp: 90 tablet, Rfl: 0 .  hydrOXYzine (ATARAX/VISTARIL) 25 MG tablet, Take 25 mg by mouth 2 (two) times daily. , Disp: , Rfl:  .  losartan (COZAAR) 25 MG tablet, Take 1 tablet (25 mg total) by mouth daily., Disp: 90 tablet, Rfl: 1 .  metFORMIN (GLUCOPHAGE) 500 MG tablet,  Take 1 tablet (500 mg total) by mouth 2 (two) times daily with a meal., Disp: 180 tablet, Rfl: 1 .  NARCAN 4 MG/0.1ML LIQD nasal spray kit, Place 1 spray into the nose as directed., Disp: , Rfl: 0 .  Norgestimate-Ethinyl Estradiol Triphasic (TRI-ESTARYLLA) 0.18/0.215/0.25 MG-35 MCG tablet, Take 1 tablet by mouth daily., Disp: 3 Package, Rfl: 1 .  omeprazole (PRILOSEC) 20 MG capsule, Take 1 capsule (20 mg total) by mouth daily., Disp: 90 capsule, Rfl: o .  oxyCODONE (OXY IR/ROXICODONE) 5 MG immediate release tablet, Take 5 mg by mouth 4 (four) times daily., Disp: , Rfl: 0 .  polyethylene glycol (MIRALAX / GLYCOLAX) packet, Take 17 g by mouth daily. For Bowels, Disp: 30 each, Rfl: 3 .  polyethylene glycol powder (GLYCOLAX/MIRALAX) 17 GM/SCOOP powder, Mix 17 grams (1 capful) with 8 ounces of fluid and take by mouth daily for bowels., Disp: 255 g, Rfl: 0 .  promethazine (PHENERGAN) 25 MG tablet, Take 1 tablet (25 mg total) by mouth every 6 (six) hours as needed for nausea or vomiting., Disp: 30 tablet, Rfl: 2 .  QUEtiapine (SEROQUEL XR) 400 MG 24 hr tablet, Take 400 mg by mouth 2 (two) times daily., Disp: , Rfl:  .  traZODone (DESYREL) 100 MG tablet, Take 1 tablet (100 mg total) by mouth at bedtime., Disp: 30 tablet, Rfl: 0 .  triamcinolone cream (KENALOG) 0.1 %, Apply 1 application topically daily as needed (for itching). , Disp: , Rfl:  .  Vitamin D, Ergocalciferol, (DRISDOL) 1.25 MG (50000 UT) CAPS capsule, One twice a week with a fatty meal, Disp: 24 capsule, Rfl: 0 .  vortioxetine HBr (TRINTELLIX) 10 MG TABS tablet, Take 10 mg by mouth at bedtime., Disp: , Rfl:  .  VRAYLAR 6 MG CAPS, Take 1 capsule by mouth daily., Disp: , Rfl:    Allergies  Allergen Reactions  . Lisinopril Hives    Other reaction(s): anaphylaxis/angioedema  . Latex Hives  . Adhesive [Tape] Rash  . Sulfamethoxazole-Trimethoprim Diarrhea    Other reaction(s): gi distress     Review of Systems  Gets hands, feet and ankles  swelling specially if she is up on her feet or ridding in a car for 1-1.5 h. Is gaining wt. Has chronic back pain. When her bones hurt, she feels pain even on pelvis. Her L knee aches and at times feels it gives away. Has trouble sleeping, and 500 mg of trazodone does not  help. Her Psychiatrist is helping her with this.  Today's Vitals   02/18/19 1050  BP: 130/78  Pulse: 66  Temp: 98.3 F (36.8 C)  TempSrc: Oral  SpO2: 98%  Weight: 154 lb 12.8 oz (70.2 kg)  Height: 5' 4.2" (1.631 m)   Body mass index is 26.41 kg/m.   Objective:  Physical Exam Vitals signs and nursing note reviewed.  Constitutional:      General: She is in acute distress.     Appearance: She is not ill-appearing, toxic-appearing or diaphoretic.     Comments: Is in pain  HENT:     Head: Normocephalic.     Right Ear: External ear normal.     Left Ear: External ear normal.     Nose: Nose normal.  Eyes:     General: No scleral icterus.    Conjunctiva/sclera: Conjunctivae normal.  Cardiovascular:     Rate and Rhythm: Normal rate.     Pulses: Normal pulses.     Heart sounds: Normal heart sounds. No murmur.  Pulmonary:     Effort: Pulmonary effort is normal.     Breath sounds: Normal breath sounds.  Musculoskeletal:     Comments: Her gait is normal and is not using her cane. ROM of upper and lower extremities are norma  Skin:    General: Skin is warm.     Capillary Refill: Capillary refill takes less than 2 seconds.     Findings: No bruising, erythema, lesion or rash.  Neurological:     Mental Status: She is alert and oriented to person, place, and time.     Comments: She has tenderness on all her joints and fascia  Psychiatric:        Thought Content: Thought content normal.        Judgment: Judgment normal.     Comments: Affect is depressed     Assessment And Plan:     1. Weight gain- acute - Thyroid Peroxidase Antibody - Thyroid antibodies  2. Myalgia- unknown etiology - Thyroid Peroxidase  Antibody - Thyroid antibodies - Ambulatory referral to Rheumatology - ANA, IFA (with reflex) - Sed Rate (ESR)  3. Other chronic pain- getting worse - Ambulatory referral to Rheumatology  4. Arthralgia, unspecified joint- chronic and getting worse - Ambulatory referral to Rheumatology   5. Essential hypertension- stable. - CBC no Diff  FU in 1-2 weeks for pap.  Arkeem Harts RODRIGUEZ-SOUTHWORTH, PA-C    THE PATIENT IS ENCOURAGED TO PRACTICE SOCIAL DISTANCING DUE TO THE COVID-19 PANDEMIC.

## 2019-02-19 ENCOUNTER — Telehealth: Payer: Medicare Other

## 2019-02-19 LAB — THYROID ANTIBODIES
Thyroglobulin Antibody: 543.5 IU/mL — ABNORMAL HIGH (ref 0.0–0.9)
Thyroperoxidase Ab SerPl-aCnc: 514 IU/mL — ABNORMAL HIGH (ref 0–34)

## 2019-02-21 LAB — CBC
Hematocrit: 42.7 % (ref 34.0–46.6)
Hemoglobin: 13.7 g/dL (ref 11.1–15.9)
MCH: 27.6 pg (ref 26.6–33.0)
MCHC: 32.1 g/dL (ref 31.5–35.7)
MCV: 86 fL (ref 79–97)
Platelets: 235 10*3/uL (ref 150–450)
RBC: 4.96 x10E6/uL (ref 3.77–5.28)
RDW: 14.3 % (ref 11.7–15.4)
WBC: 7.2 10*3/uL (ref 3.4–10.8)

## 2019-02-21 LAB — FANA STAINING PATTERNS: Speckled Pattern: 1:80 {titer}

## 2019-02-21 LAB — ANTINUCLEAR ANTIBODIES, IFA: ANA Titer 1: POSITIVE — AB

## 2019-02-21 LAB — SEDIMENTATION RATE: Sed Rate: 18 mm/hr (ref 0–40)

## 2019-02-21 LAB — TSH: TSH: 1.66 u[IU]/mL (ref 0.450–4.500)

## 2019-02-22 ENCOUNTER — Other Ambulatory Visit: Payer: Self-pay | Admitting: Internal Medicine

## 2019-02-22 ENCOUNTER — Ambulatory Visit: Payer: Self-pay

## 2019-02-22 DIAGNOSIS — E119 Type 2 diabetes mellitus without complications: Secondary | ICD-10-CM

## 2019-02-22 DIAGNOSIS — M5416 Radiculopathy, lumbar region: Secondary | ICD-10-CM

## 2019-02-22 DIAGNOSIS — E063 Autoimmune thyroiditis: Secondary | ICD-10-CM

## 2019-02-22 DIAGNOSIS — M5412 Radiculopathy, cervical region: Secondary | ICD-10-CM

## 2019-02-22 DIAGNOSIS — R296 Repeated falls: Secondary | ICD-10-CM

## 2019-02-22 DIAGNOSIS — R768 Other specified abnormal immunological findings in serum: Secondary | ICD-10-CM

## 2019-02-22 DIAGNOSIS — I10 Essential (primary) hypertension: Secondary | ICD-10-CM

## 2019-02-22 NOTE — Progress Notes (Signed)
Free T3 and Free T4 added

## 2019-02-22 NOTE — Progress Notes (Signed)
Rheumatology order placed.

## 2019-02-23 ENCOUNTER — Ambulatory Visit: Payer: Self-pay

## 2019-02-23 ENCOUNTER — Other Ambulatory Visit: Payer: Self-pay

## 2019-02-23 ENCOUNTER — Other Ambulatory Visit: Payer: Medicare Other

## 2019-02-23 DIAGNOSIS — I10 Essential (primary) hypertension: Secondary | ICD-10-CM

## 2019-02-23 DIAGNOSIS — M5412 Radiculopathy, cervical region: Secondary | ICD-10-CM

## 2019-02-23 DIAGNOSIS — M5416 Radiculopathy, lumbar region: Secondary | ICD-10-CM

## 2019-02-23 DIAGNOSIS — E063 Autoimmune thyroiditis: Secondary | ICD-10-CM

## 2019-02-23 DIAGNOSIS — E559 Vitamin D deficiency, unspecified: Secondary | ICD-10-CM

## 2019-02-23 DIAGNOSIS — R296 Repeated falls: Secondary | ICD-10-CM

## 2019-02-23 DIAGNOSIS — E119 Type 2 diabetes mellitus without complications: Secondary | ICD-10-CM

## 2019-02-23 NOTE — Patient Instructions (Signed)
Visit Information  Goals Addressed      Patient Stated   . "My bones hurt" "The pain pump is not helping" (pt-stated)       Current Barriers:  [name] Knowledge Deficits related to unknown etiology for chronic debilitating musculoskeletal and bone Pain  . Lacks caregiver support  Nurse Case Manager Clinical Goal(s):  [name] Over the next 120 days, patient will verbalize understanding of plan for evaluation of and treatment for pain. (Revised goal target date due to COVID-19 delays in care) 12/10/18: Target Goal date revised to 120 days due to COVID-19 treatment delays. AL   CCM RN CM Interventions:  02/09/19 call completed with patient   . Evaluation of current treatment plan related to chronic pain management and patient's adherence to plan as established by provider. . Provided education to patient re: disease process for autoimmune disorders; discussed cause and effect; discussed common autoimmune disorders, treatment and diagnosis; Reviewed lab results from recent visit; discussed all abnormal labs and indication to move forward with referrals to Endocrinology and Rheumatology . Reviewed medications with patient and discussed patient's ongoing follow up with her Pain management doctor; discussed patient's next OV is scheduled for 04/09/19; patient continues to be debilitating and uncontrolled with use of intrathecal pain pump . Collaborated with provider Audery Amel PA-C via in basket message regarding discussed add on labs for T3 & T4; requested a Vitamin D level be added and [name] agreed; discussed with patient that additional labs have been added and will require a trip to the office for lab draws - patient verbalizes understanding  . Discussed plans with patient for ongoing care management follow up and provided patient with direct contact information for care management team . Reviewed scheduled/upcoming provider appointments including: OV scheduled with provider Audery Amel PA-C for  03/25/19 @8 :45 AM, Reviewed new patient appointment with Dr. Estanislado Pandy is scheduled for 03/31/19 @9 :73 AM; Reviewed new patient referral to Dr. Kelton Pillar, Endocrinologist is pending review  Patient Self Care Activities:   Verbalizes understanding of information/education provided . Currently UNABLE TO independently perform self care   Please see past updates related to this goal by clicking on the "Past Updates" button in the selected goal        The patient verbalized understanding of instructions provided today and declined a print copy of patient instruction materials.   Telephone follow up appointment with care management team member scheduled for: 03/04/19  Barb Merino, RN, BSN, CCM Care Management Coordinator [facility]  Direct Phone: [phone]

## 2019-02-23 NOTE — Chronic Care Management (AMB) (Addendum)
  Chronic Care Management   Follow Up Note   02/22/2019 Name: DIANNA EWALD MRN: [id number] DOB: 1965-12-20  Referred by: Shelby Mattocks, PA-C Reason for referral : Chronic Care Management (CCM RN CM Telephone Follow up )   TRENIA TENNYSON is a 53 y.o. year old female who is a primary care patient of [place], [name], [place]. The CCM team was consulted for assistance with chronic disease management and care coordination needs.    Review of patient status, including review of consultants reports, relevant laboratory and other test results, and collaboration with appropriate care team members and the patient's provider was performed as part of comprehensive patient evaluation and provision of chronic care management services.    I spoke with Ms. Giebel by telephone today to follow up on her recent lab results and to discuss her referral to Rheumatology and Endocrinology.   Goals Addressed      Patient Stated   . "My bones hurt" "The pain pump is not helping" (pt-stated)       Current Barriers:  [name] Knowledge Deficits related to unknown etiology for chronic debilitating musculoskeletal and bone Pain  . Lacks caregiver support  Nurse Case Manager Clinical Goal(s):  [name] Over the next 120 days, patient will verbalize understanding of plan for evaluation of and treatment for pain. (Revised goal target date due to COVID-19 delays in care) 12/10/18: Target Goal date revised to 120 days due to COVID-19 treatment delays. AL   CCM RN CM Interventions:  02/09/19 call completed with patient   . Evaluation of current treatment plan related to chronic pain management and patient's adherence to plan as established by provider. . Provided education to patient re: disease process for autoimmune disorders; discussed cause and effect; discussed common autoimmune disorders, treatment and diagnosis; Reviewed lab results from recent visit; discussed all abnormal labs and indication to move  forward with referrals to Endocrinology and Rheumatology . Reviewed medications with patient and discussed patient's ongoing follow up with her Pain management doctor; discussed patient's next OV is scheduled for 04/09/19; patient continues to be debilitating and uncontrolled with use of intrathecal pain pump . Collaborated with provider Audery Amel PA-C via in basket message regarding discussed add on labs for T3 & T4; requested a Vitamin D level be added and [name] agreed; discussed with patient that additional labs have been added and will require a trip to the office for lab draws - patient verbalizes understanding  . Discussed plans with patient for ongoing care management follow up and provided patient with direct contact information for care management team . Reviewed scheduled/upcoming provider appointments including: OV scheduled with provider Audery Amel PA-C for 03/25/19 @8 :45 AM, Reviewed new patient appointment with Dr. Estanislado Pandy is scheduled for 03/31/19 @9 :69 AM; Reviewed new patient referral to Dr. Kelton Pillar, Endocrinologist is pending review  Patient Self Care Activities:   Verbalizes understanding of information/education provided . Currently UNABLE TO independently perform self care   Please see past updates related to this goal by clicking on the "Past Updates" button in the selected goal         Telephone follow up appointment with care management team member scheduled for: 03/04/19   Barb Merino, RN, BSN, CCM Care Management Coordinator [facility]  Direct Phone: [phone]

## 2019-02-23 NOTE — Chronic Care Management (AMB) (Signed)
Chronic Care Management   Follow Up Note   02/23/2019 Name: Linda Mercado MRN: [id number] DOB: 1965/10/19  Referred by: Shelby Mattocks, PA-C Reason for referral : Chronic Care Management (CCM RNCM Telephone Follow up)   Linda Mercado is a 53 y.o. year old female who is a primary care patient of [place], [name], [place]. The CCM team was consulted for assistance with chronic disease management and care coordination needs.    Review of patient status, including review of consultants reports, relevant laboratory and other test results, and collaboration with appropriate care team members and the patient's provider was performed as part of comprehensive patient evaluation and provision of chronic care management services.    I received an inbound call from Linda Mercado today.   Goals Addressed      Patient Stated   . "My bones hurt" "The pain pump is not helping" (pt-stated)       Current Barriers:  [name] Knowledge Deficits related to unknown etiology for chronic debilitating musculoskeletal and bone Pain  . Lacks caregiver support  Nurse Case Manager Clinical Goal(s):  [name] Over the next 120 days, patient will verbalize understanding of plan for evaluation of and treatment for pain. (Revised goal target date due to COVID-19 delays in care) 12/10/18: Target Goal date revised to 120 days due to COVID-19 treatment delays. AL   CCM RN CM Interventions:  02/22/19 call completed with patient   . Evaluation of current treatment plan related to chronic pain management and patient's adherence to plan as established by provider. . Provided education to patient re: disease process for autoimmune disorders; discussed cause and effect; discussed common autoimmune disorders, treatment and diagnosis; Reviewed lab results from recent visit; discussed all abnormal labs and indication to move forward with referrals to Endocrinology and Rheumatology . Reviewed medications with patient and  discussed patient's ongoing follow up with her Pain management doctor; discussed patient's next OV is scheduled for 04/09/19; patient continues to be debilitating and uncontrolled with use of intrathecal pain pump . Collaborated with provider Audery Amel PA-C via in basket message regarding discussed add on labs for T3 & T4; requested a Vitamin D level be added and [name] agreed; discussed with patient that additional labs have been added and will require a trip to the office for lab draws - patient verbalizes understanding  . Discussed plans with patient for ongoing care management follow up and provided patient with direct contact information for care management team . Reviewed scheduled/upcoming provider appointments including: OV scheduled with provider Audery Amel PA-C for 03/25/19 @8 :45 AM, Reviewed new patient appointment with Dr. Estanislado Pandy is scheduled for 03/31/19 @9 :74 AM; Reviewed new patient referral to Dr. Kelton Pillar, Endocrinologist is pending review  02/23/19 Call completed with patient and aunt [name] (aunt added to demographics per patient request as primary contact)  . Received inbound call from patient requesting I complete a joint call with herself and her aunt [name] who is a retired Therapist, sports . Answered questions regarding patient's recent abnormal lab results suggestive of auto-immune disease process needing further evaluation  . Discussed plan of care to include referrals to Endocrinology and Rheumatology . Discussed OV for Rheumatology has been scheduled for 03/31/19 @9 :45 AM; discussed the Endocrinology referral is pending review by Dr. Kelton Pillar . Encouraged patient and aunt to have someone attend these visits with the patient to help listen and take notes, ask questions, etc... concerning her evaluations  . Assessed for Medicaid status; patient states "I have full Medicaid  and Medicare"; discussed having embedded BSW Daneen Schick provide assistance with  transportation and CNA is eligible . Sent in basket message to Walker Kehr with request to further assist Linda Mercado with Coordination of transportation and caregiver assistance if eligible  . Reiterated the need for Linda Mercado to come back into the office for additional labs and Linda Mercado states she plans to do so today; discussed office/lunch hours . Discussed plans with patient for ongoing care management follow up and provided patient with direct contact information for care management team  Patient Self Care Activities:   Verbalizes understanding of information/education provided . Currently UNABLE TO independently perform self care   Please see past updates related to this goal by clicking on the "Past Updates" button in the selected goal         Telephone follow up appointment with care management team member scheduled for: 03/04/19   Barb Merino, RN, BSN, CCM Care Management Coordinator [facility]  Direct Phone: [phone]

## 2019-02-23 NOTE — Patient Instructions (Signed)
Visit Information  Goals Addressed      Patient Stated   . "My bones hurt" "The pain pump is not helping" (pt-stated)       Current Barriers:  [name] Knowledge Deficits related to unknown etiology for chronic debilitating musculoskeletal and bone Pain  . Lacks caregiver support  Nurse Case Manager Clinical Goal(s):  [name] Over the next 120 days, patient will verbalize understanding of plan for evaluation of and treatment for pain. (Revised goal target date due to COVID-19 delays in care) 12/10/18: Target Goal date revised to 120 days due to COVID-19 treatment delays. AL   CCM RN CM Interventions:  02/22/19 call completed with patient   . Evaluation of current treatment plan related to chronic pain management and patient's adherence to plan as established by provider. . Provided education to patient re: disease process for autoimmune disorders; discussed cause and effect; discussed common autoimmune disorders, treatment and diagnosis; Reviewed lab results from recent visit; discussed all abnormal labs and indication to move forward with referrals to Endocrinology and Rheumatology . Reviewed medications with patient and discussed patient's ongoing follow up with her Pain management doctor; discussed patient's next OV is scheduled for 04/09/19; patient continues to be debilitating and uncontrolled with use of intrathecal pain pump . Collaborated with provider Audery Amel PA-C via in basket message regarding discussed add on labs for T3 & T4; requested a Vitamin D level be added and [name] agreed; discussed with patient that additional labs have been added and will require a trip to the office for lab draws - patient verbalizes understanding  . Discussed plans with patient for ongoing care management follow up and provided patient with direct contact information for care management team . Reviewed scheduled/upcoming provider appointments including: OV scheduled with provider Audery Amel PA-C for  03/25/19 @8 :45 AM, Reviewed new patient appointment with Dr. Estanislado Pandy is scheduled for 03/31/19 @9 :58 AM; Reviewed new patient referral to Dr. Kelton Pillar, Endocrinologist is pending review  02/23/19 Call completed with patient and aunt [name] (aunt added to demographics per patient request as primary contact)  . Received inbound call from patient requesting I complete a joint call with herself and her aunt [name] who is a retired Therapist, sports . Answered questions regarding patient's recent abnormal lab results suggestive of auto-immune disease process needing further evaluation  . Discussed plan of care to include referrals to Endocrinology and Rheumatology . Discussed OV for Rheumatology has been scheduled for 03/31/19 @9 :45 AM; discussed the Endocrinology referral is pending review by Dr. Kelton Pillar . Encouraged patient and aunt to have someone attend these visits with the patient to help listen and take notes, ask questions, etc... concerning her evaluations  . Assessed for Medicaid status; patient states "I have full Medicaid and Medicare"; discussed having embedded BSW Daneen Schick provide assistance with transportation and CNA is eligible . Sent in basket message to Walker Kehr with request to further assist Ms. Barthel with Coordination of transportation and caregiver assistance if eligible  . Reiterated the need for Ms. Cypert to come back into the office for additional labs and Ms. Colebank states she plans to do so today; discussed office/lunch hours . Discussed plans with patient for ongoing care management follow up and provided patient with direct contact information for care management team  Patient Self Care Activities:   Verbalizes understanding of information/education provided . Currently UNABLE TO independently perform self care   Please see past updates related to this goal by clicking on the "Past Updates" button  in the selected goal         The patient verbalized understanding  of instructions provided today and declined a print copy of patient instruction materials.   Telephone follow up appointment with care management team member scheduled for: 03/04/19  Barb Merino, RN, BSN, CCM Care Management Coordinator [facility]  Direct Phone: [phone]

## 2019-02-24 LAB — T4, FREE: Free T4: 1.16 ng/dL (ref 0.82–1.77)

## 2019-02-24 LAB — T3, FREE: T3, Free: 2.8 pg/mL (ref 2.0–4.4)

## 2019-02-25 ENCOUNTER — Other Ambulatory Visit: Payer: Self-pay | Admitting: Internal Medicine

## 2019-02-25 DIAGNOSIS — E559 Vitamin D deficiency, unspecified: Secondary | ICD-10-CM

## 2019-02-26 ENCOUNTER — Other Ambulatory Visit: Payer: Self-pay | Admitting: Internal Medicine

## 2019-02-26 DIAGNOSIS — K219 Gastro-esophageal reflux disease without esophagitis: Secondary | ICD-10-CM

## 2019-02-26 MED ORDER — DICLOFENAC SODIUM 1 % TD GEL: 4.0000 g | Freq: Four times a day (QID) | TRANSDERMAL | 2 refills | Status: DC

## 2019-02-26 MED ORDER — TRIAMCINOLONE ACETONIDE 0.1 % EX CREA: 1.0000 "application " | TOPICAL_CREAM | Freq: Every day | CUTANEOUS | 2 refills | Status: DC | PRN

## 2019-03-01 ENCOUNTER — Ambulatory Visit: Payer: Self-pay

## 2019-03-01 ENCOUNTER — Telehealth: Payer: Medicare Other

## 2019-03-01 DIAGNOSIS — I10 Essential (primary) hypertension: Secondary | ICD-10-CM

## 2019-03-01 DIAGNOSIS — E119 Type 2 diabetes mellitus without complications: Secondary | ICD-10-CM

## 2019-03-01 NOTE — Chronic Care Management (AMB) (Signed)
  Chronic Care Management   Social Work Note  03/01/2019 Name: Linda Mercado MRN: [id number] DOB: 1966-06-12  CCM SW placed an unsuccessful outbound call to the patient in response to communication received from RN Case Manager [place] stating the patient would like assistance with transportation and caregiver resources under her Medicaid benefit. CCM SW left a HIPAA compliant voice message requesting a return call.  Follow Up Plan: SW will follow up with patient by phone over the next 10 days.  Daneen Schick, BSW, CDP Social Worker, Certified Dementia Practitioner [place] [facility] [phone]

## 2019-03-02 LAB — VITAMIN D 25 HYDROXY (VIT D DEFICIENCY, FRACTURES): Vit D, 25-Hydroxy: 100 ng/mL (ref 30.0–100.0)

## 2019-03-02 LAB — SPECIMEN STATUS REPORT

## 2019-03-04 ENCOUNTER — Telehealth: Payer: Medicare Other

## 2019-03-08 ENCOUNTER — Other Ambulatory Visit: Payer: Self-pay

## 2019-03-08 MED ORDER — TRIAMCINOLONE ACETONIDE 0.1 % EX CREA: 1.0000 "application " | TOPICAL_CREAM | Freq: Every day | CUTANEOUS | 2 refills | Status: AC | PRN

## 2019-03-09 ENCOUNTER — Telehealth: Payer: Medicare Other

## 2019-03-09 ENCOUNTER — Ambulatory Visit (INDEPENDENT_AMBULATORY_CARE_PROVIDER_SITE_OTHER): Payer: Medicare Other

## 2019-03-09 DIAGNOSIS — E119 Type 2 diabetes mellitus without complications: Secondary | ICD-10-CM

## 2019-03-09 DIAGNOSIS — I10 Essential (primary) hypertension: Secondary | ICD-10-CM

## 2019-03-09 DIAGNOSIS — R296 Repeated falls: Secondary | ICD-10-CM

## 2019-03-09 NOTE — Chronic Care Management (AMB) (Signed)
  Chronic Care Management   Social Work Note  03/09/2019 Name: Linda Mercado MRN: [id number] DOB: May 25, 1966  CCM SW placed a second unsuccessful outbound call to the patient in hopes of assisting with transportation and PCS benefits under the patients newly approved Medicaid plan. CCM SW left a HIPAA compliant voice message requesting a return call.  Follow Up Plan: SW will attempt a third and final outreach to the patient by phone over the next 10 days.  Daneen Schick, BSW, CDP Social Worker, Certified Dementia Practitioner [place] [facility] [phone]

## 2019-03-17 ENCOUNTER — Ambulatory Visit: Payer: Self-pay

## 2019-03-17 ENCOUNTER — Telehealth: Payer: Self-pay

## 2019-03-17 DIAGNOSIS — E119 Type 2 diabetes mellitus without complications: Secondary | ICD-10-CM

## 2019-03-17 DIAGNOSIS — R296 Repeated falls: Secondary | ICD-10-CM

## 2019-03-17 DIAGNOSIS — I10 Essential (primary) hypertension: Secondary | ICD-10-CM

## 2019-03-17 NOTE — Chronic Care Management (AMB) (Signed)
Chronic Care Management   Social Work Note  03/17/2019 Name: Linda Mercado MRN: [id number] DOB: May 07, 1966  Linda Mercado is a 53 y.o. year old female who sees [place], [name], [place] for primary care. The CCM team was consulted for assistance with [employer] .   I placed an outbound call to the patient as a third outreach attempt to assist with caregiver and transportation resources. Unfortunately, the patient reported she was unable to complete today's call. CCM SW has encouraged the patient to contact CCM SW when she is available at a later time.  Outpatient Encounter Medications as of 03/17/2019  Medication Sig Note  . albuterol (VENTOLIN HFA) 108 (90 Base) MCG/ACT inhaler Inhale 2 puffs into the lungs every 6 (six) hours as needed for wheezing or shortness of breath.   . AMITIZA 24 MCG capsule 24 mcg 2 (two) times daily.   [name] atenolol (TENORMIN) 100 MG tablet TAKE 1 TABLET(100 MG) BY MOUTH DAILY   . atorvastatin (LIPITOR) 20 MG tablet Take 1 tablet (20 mg total) by mouth at bedtime.   . calcium gluconate 500 MG tablet Take one Monday through Friday only.   . clonazePAM (KLONOPIN) 1 MG tablet Take 1 mg by mouth 3 (three) times daily.   . Cyanocobalamin (B-12) 500 MCG SUBL One SL QD   . DEXILANT 60 MG capsule Take 1 capsule (60 mg total) by mouth daily.   . diclofenac sodium (VOLTAREN) 1 % GEL Apply 4 g topically 4 (four) times daily.   . divalproex (DEPAKOTE ER) 500 MG 24 hr tablet Take 1,000 mg by mouth daily.  03/18/2018: Strength and sig verified by daughter, [facility], and [place]  . docusate sodium (COLACE) 50 MG capsule Take 50 mg by mouth at bedtime.   . fluticasone (FLOVENT HFA) 110 MCG/ACT inhaler Inhale 2 puffs into the lungs every 12 (twelve) hours.   [name] FREESTYLE LITE test strip CHECK BLOOD SUGAR 3 TO 4 TIMES DAILY   . hydrochlorothiazide (HYDRODIURIL) 12.5 MG tablet Take 1 tablet by mouth once daily.   . hydrOXYzine (ATARAX/VISTARIL) 25 MG tablet Take 25  mg by mouth 2 (two) times daily.    [name] losartan (COZAAR) 25 MG tablet Take 1 tablet (25 mg total) by mouth daily.   . metFORMIN (GLUCOPHAGE) 500 MG tablet Take 1 tablet (500 mg total) by mouth 2 (two) times daily with a meal.   . NARCAN 4 MG/0.1ML LIQD nasal spray kit Place 1 spray into the nose as directed.   . Norgestimate-Ethinyl Estradiol Triphasic (TRI-ESTARYLLA) 0.18/0.215/0.25 MG-35 MCG tablet Take 1 tablet by mouth daily.   [name] oxyCODONE (OXY IR/ROXICODONE) 5 MG immediate release tablet Take 5 mg by mouth 4 (four) times daily.   . polyethylene glycol powder (GLYCOLAX/MIRALAX) 17 GM/SCOOP powder Mix 17 grams (1 capful) with 8 ounces of fluid and take by mouth daily for bowels.   . promethazine (PHENERGAN) 25 MG tablet Take 1 tablet (25 mg total) by mouth every 6 (six) hours as needed for nausea or vomiting.   [name] QUEtiapine (SEROQUEL XR) 400 MG 24 hr tablet Take 400 mg by mouth 2 (two) times daily.   . traZODone (DESYREL) 100 MG tablet 5 qhs prescribed by psychiatry   . triamcinolone cream (KENALOG) 0.1 % Apply 1 application topically daily as needed (for itching).   . Vitamin D, Ergocalciferol, (DRISDOL) 1.25 MG (50000 UT) CAPS capsule One twice a week with a fatty meal   . vortioxetine HBr (TRINTELLIX) 10 MG TABS tablet Take  10 mg by mouth at bedtime.   [name] VRAYLAR 6 MG CAPS Take 1 capsule by mouth daily.    No facility-administered encounter medications on file as of 03/17/2019.     Follow Up Plan: No further follow up planned. CCM SW will await patients return call.  Daneen Schick, BSW, CDP Social Worker, Certified Dementia Practitioner [place] [facility] [phone]

## 2019-03-18 NOTE — Progress Notes (Signed)
Office Visit Note  Patient: Linda Mercado             Date of Birth: September 24, 1965           MRN: [id number]             PCP: Shelby Mattocks, PA-C Referring: Carol Ada* Visit Date: 03/31/2019 Occupation: @GUAROCC @  Subjective:  Pain in multiple joints and positive ANA.   History of Present Illness: Linda Mercado is a 53 y.o. female seen in consultation per request of her PCP for evaluation of positive ANA.  According to patient she has had multiples surgeries on her back due to a back injury.  She has been living in chronic pain for multiple years.  She states in August 2017 a pain pump was placed to manage pain.  She states 4 months after the placement of the pain pump she started having worsening of her pain symptoms.  She has flares of increased pain off and on.  She has been having increased joint pain all over now.  She states she has had several visits to the emergency room.  At times she is unable to walk due to severe pain.  She feels like all her bones are broken inside her body.  She states her joints are very stiff.  She has discomfort in her neck, lower back, her hands, knee joints and her feet.  She notices intermittent swelling in her joints.  She also gives history of frequent falls.  She states she fell about a week ago and was seen by her PCP and was diagnosed with concussion injury.  Activities of Daily Living:  Patient reports morning stiffness for 24 hours.   Patient Reports nocturnal pain.  Difficulty dressing/grooming: Reports Difficulty climbing stairs: Reports Difficulty getting out of chair: Reports Difficulty using hands for taps, buttons, cutlery, and/or writing: Reports  Review of Systems  Constitutional: Positive for appetite change and weight gain. Negative for fatigue, night sweats and weight loss.  HENT: Positive for mouth dryness. Negative for mouth sores, trouble swallowing, trouble swallowing and nose dryness.   Eyes: Negative  for pain, redness, itching, visual disturbance and dryness.  Respiratory: Negative for cough, shortness of breath, wheezing and difficulty breathing.   Cardiovascular: Negative for chest pain, palpitations, hypertension, irregular heartbeat and swelling in legs/feet.  Gastrointestinal: Negative for blood in stool, constipation and diarrhea.  Endocrine: Negative for increased urination.  Genitourinary: Negative for difficulty urinating, painful urination and vaginal dryness.  Musculoskeletal: Positive for arthralgias, joint pain, joint swelling and morning stiffness. Negative for myalgias, muscle weakness, muscle tenderness and myalgias.  Skin: Positive for color change. Negative for rash, hair loss, skin tightness, ulcers and sensitivity to sunlight.  Allergic/Immunologic: Negative for susceptible to infections.  Neurological: Positive for numbness and headaches. Negative for dizziness, memory loss, night sweats and weakness.  Hematological: Negative for bruising/bleeding tendency and swollen glands.  Psychiatric/Behavioral: Positive for depressed mood and sleep disturbance. The patient is nervous/anxious.     PMFS History:  Patient Active Problem List   Diagnosis Date Noted  . Nausea 11/23/2018  . Frequent falls 09/17/2018  . Skin lesion 09/17/2018  . GERD (gastroesophageal reflux disease)   . Chronic midline low back pain with sciatica 05/27/2018  . Intractable pain 03/20/2018  . Bradycardia   . Chronic pain syndrome 03/18/2018  . 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 Hypokalemia 03/18/2018  . Intractable back pain 01/03/2018  . HTN (hypertension) 01/03/2018  . Type 2 diabetes mellitus  without complication ([place]) [id number]  . Vitamin D deficiency 07/11/2017  . Postmenopausal 04/24/2017  . Microalbuminuria 01/14/2017  . Dyspareunia in female 12/10/2016  . Endometriosis determined by laparoscopy 12/10/2016  . History of exploratory laparotomy 12/10/2016  . Pelvic pain  12/10/2016  . Bipolar 1 disorder ([place]) 09/19/2016  . Cyst of right ovary 09/19/2016  . Severe episode of recurrent major depressive disorder, with psychotic features ([place]) 09/19/2016  . Schizoaffective disorder, bipolar type ([place]) 09/19/2016  . Chronic pain 03/05/2016  . Non morbid obesity due to excess calories 03/05/2016  . S/P lumbar fusion 03/05/2016    Past Medical History:  Diagnosis Date  . Bradycardia   . Chronic back pain   . Diabetes mellitus without complication ([place])   . Hypertension   . Hypokalemia     Family History  Problem Relation Age of Onset  . Diabetes Father   . Hypertension Father   . Kidney disease Mother   . Hypertension Mother   . Bipolar disorder Sister   . Schizophrenia Sister   . Neuropathy Sister   . Hypertension Sister   . Bipolar disorder Brother   . Bipolar disorder Brother   . Schizophrenia Brother   . Bipolar disorder Daughter   . Post-traumatic stress disorder Daughter   . Asthma Daughter   . Depression Daughter   . Bipolar disorder Daughter   . Post-traumatic stress disorder Daughter   . Asthma Daughter   . Depression Daughter   . Asthma Daughter   . Depression Daughter   . Asthma Daughter   . Stroke Neg Hx   . Cancer Neg Hx   . CAD Neg Hx    Past Surgical History:  Procedure Laterality Date  . APPENDECTOMY    . BACK SURGERY     x6  . CESAREAN SECTION     x3  . HERNIA REPAIR     Social History   Social History Narrative   Caffeine- none   Immunization History  Administered Date(s) Administered  . Influenza Inj Mdck Quad With Preservative 05/11/2018  . Influenza,inj,Quad PF,6+ Mos 09/19/2016, 04/24/2017     Objective: Vital Signs: BP (!) 139/91 (BP Location: Right Arm, Patient Position: Sitting, Cuff Size: Normal)   Pulse 76   Ht 5\' 3"  (1.6 m)   Wt 160 lb 9.6 oz (72.8 kg)   BMI 28.45 kg/m    Physical Exam Vitals signs and nursing note reviewed.  Constitutional:      Appearance: She is well-developed.   HENT:     Head: Normocephalic and atraumatic.  Eyes:     Conjunctiva/sclera: Conjunctivae normal.  Neck:     Musculoskeletal: Normal range of motion.  Cardiovascular:     Rate and Rhythm: Normal rate and regular rhythm.     Heart sounds: Normal heart sounds.  Pulmonary:     Effort: Pulmonary effort is normal.     Breath sounds: Normal breath sounds.  Abdominal:     General: Bowel sounds are normal.     Palpations: Abdomen is soft.  Lymphadenopathy:     Cervical: No cervical adenopathy.  Skin:    General: Skin is warm and dry.     Capillary Refill: Capillary refill takes less than 2 seconds.  Neurological:     Mental Status: She is alert and oriented to person, place, and time.  Psychiatric:        Behavior: Behavior normal.      Musculoskeletal Exam: She is a stiffness and discomfort with range of  motion of her cervical spine.  She has painful limited range of motion of her lumbar spine.  Shoulder joints, elbow joints, wrist joints, MCPs PIPs and DIPs with good range of motion with no synovitis.  He has good range of motion of her knee joints, hip joints, ankles and MTPs.  She has discomfort range of motion of her joints but no synovitis was noted.  She has generalized hyperalgesia.  CDAI Exam: CDAI Score: - Patient Global: -; Provider Global: - Swollen: -; Tender: - Joint Exam   No joint exam has been documented for this visit   There is currently no information documented on the homunculus. Go to the Rheumatology activity and complete the homunculus joint exam.  Investigation: Findings:  10/06/18: Vitamin B12 586, vitamin D 26, CK 56 02/18/19: ANA 1:80 speckled, sed rate 18, TSH 1.66, Thyroperoxidase Ab 514, Thyroglobulin Ab 543, T4 1.16   Component     Latest Ref Rng & Units 10/06/2018 02/18/2019  Thyroperoxidase Ab SerPl-aCnc     0 - 34 IU/mL  514 (H)  Thyroglobulin Antibody     0.0 - 0.9 IU/mL  543.5 (H)  Vitamin B12     232 - 1,245 pg/mL 586   Vitamin D,  25-Hydroxy     30.0 - 100.0 ng/mL 26.7 (L)   CK Total     24 - 173 U/L 56   ANA Titer 1       Positive (A)  Sed Rate     0 - 40 mm/hr  18  TSH     0.450 - 4.500 uIU/mL  1.660    Imaging: Xr Hand 2 View Left  Result Date: 03/31/2019 No MCP, PIP DIP or intercarpal joint space narrowing was noted.  No erosive changes were noted.  No juxta-articular osteopenia was noted. Impression: Unremarkable x-ray of the hand.  Xr Hand 2 View Right  Result Date: 03/31/2019 No MCP, PIP DIP or intercarpal joint space narrowing was noted.  No erosive changes were noted.  No juxta-articular osteopenia was noted. Impression: Unremarkable x-ray of the hand.  Xr Knee 3 View Left  Result Date: 03/31/2019 No medial lateral compartment narrowing was noted.  Mild patellofemoral narrowing was noted.  No chondrocalcinosis was noted. Impression: These findings are consistent with mild chondromalacia patella of the knee.  Xr Knee 3 View Right  Result Date: 03/31/2019 No medial lateral compartment narrowing was noted.  Mild patellofemoral narrowing was noted.  No chondrocalcinosis was noted. Impression: These findings are consistent with mild chondromalacia patella of the knee.   Recent Labs: Lab Results  Component Value Date   WBC 7.2 02/18/2019   HGB 13.7 02/18/2019   PLT 235 02/18/2019   NA 141 09/17/2018   K 3.9 09/17/2018   CL 102 09/17/2018   CO2 22 09/17/2018   GLUCOSE 135 (H) 09/17/2018   BUN 13 09/17/2018   CREATININE 0.85 09/17/2018   BILITOT <0.2 09/17/2018   ALKPHOS 110 09/17/2018   AST 9 09/17/2018   ALT 9 09/17/2018   PROT 7.7 09/17/2018   ALBUMIN 4.6 09/17/2018   CALCIUM 10.5 (H) 09/17/2018   GFRAA 91 09/17/2018    Speciality Comments: No specialty comments available.  Procedures:  No procedures performed Allergies: Lisinopril, Latex, Adhesive [tape], and Sulfamethoxazole-trimethoprim   Assessment / Plan:     Visit Diagnoses: Positive ANA (antinuclear antibody) - 10/06/18:  Vitamin B12 586, vitamin D 26, CK 567/30/20: ANA 1:80 speckled, sed rate 18, TSH 1.66, Thyroperoxidase Ab 514, Thyroglobulin Ab 543,  T4 1.16.  She has positive ANA but no clinical features of autoimmune disease.  I believe that her ANA most likely this is related to thyroid disease.  I will obtain [facility]  labs.  Polyarthralgia-she complains of pain and discomfort in all of her joints.  She states that her bones are painful to the point" they feel like they are breaking."   Myalgia-she complains of generalized muscle pain.  No muscle weakness was noted.  Pain in both hands -she complains of severe pain in her hands and intermittent swelling.  No synovitis was noted.  X-ray of bilateral hands were unremarkable today.  Plan: XR Hand 2 View Right, XR Hand 2 View Left  Chronic pain of both knees -she complains of bilateral knee joint pain.  X-ray of the knee joints were unremarkable except for mild chondromalacia patella.  Plan: XR KNEE 3 VIEW RIGHT, XR KNEE 3 VIEW LEFT  Pain in both feet -she complains of bilateral feet discomfort and intermittent swelling.  No synovitis was noted.  Plan: XR Foot 2 Views Right, XR Foot 2 Views Left the x-rays showed mild osteoarthritic changes.  S/P lumbar fusion-patient is in chronic pain.  She goes to pain management.  Frequent falls-she gives history of frequent falls.  She states she had a fall 1 week ago.  She may benefit from physical therapy.  I have advised her to discuss that further with her PCP.  Chronic pain syndrome-she is in pain management program and uses a pain pump.  Bipolar 1 disorder (HCC)  Gastroesophageal reflux disease without esophagitis  Essential hypertension  Type 2 diabetes mellitus without complication, without long-term current use of insulin (HCC)  Vitamin D deficiency  Orders: Orders Placed This Encounter  Procedures  . XR Hand 2 View Right  . XR Hand 2 View Left  . XR KNEE 3 VIEW RIGHT  . XR KNEE 3 VIEW LEFT  . XR Foot 2  Views Right  . XR Foot 2 Views Left   No orders of the defined types were placed in this encounter.   Face-to-face time spent with patient was 50 minutes. Greater than 50% of time was spent in counseling and coordination of care.  Follow-Up Instructions: Return for Positive ANA, joint pain.   Bo Merino, MD  Note - This record has been created using Editor, commissioning.  Chart creation errors have been sought, but may not always  have been located. Such creation errors do not reflect on  the standard of medical care.

## 2019-03-19 ENCOUNTER — Other Ambulatory Visit: Payer: Self-pay | Admitting: Internal Medicine

## 2019-03-23 ENCOUNTER — Ambulatory Visit: Payer: Medicare Other | Admitting: Internal Medicine

## 2019-03-24 ENCOUNTER — Other Ambulatory Visit: Payer: Self-pay

## 2019-03-25 ENCOUNTER — Encounter: Payer: Medicare Other | Admitting: Internal Medicine

## 2019-03-25 ENCOUNTER — Ambulatory Visit (INDEPENDENT_AMBULATORY_CARE_PROVIDER_SITE_OTHER): Payer: Medicare Other | Admitting: Internal Medicine

## 2019-03-25 VITALS — BP 128/84 | HR 63 | Temp 97.9°F

## 2019-03-25 DIAGNOSIS — M62838 Other muscle spasm: Secondary | ICD-10-CM

## 2019-03-25 DIAGNOSIS — S39012A Strain of muscle, fascia and tendon of lower back, initial encounter: Secondary | ICD-10-CM | POA: Diagnosis not present

## 2019-03-25 DIAGNOSIS — W19XXXA Unspecified fall, initial encounter: Secondary | ICD-10-CM | POA: Diagnosis not present

## 2019-03-25 DIAGNOSIS — S20212A Contusion of left front wall of thorax, initial encounter: Secondary | ICD-10-CM

## 2019-03-25 MED ORDER — CYCLOBENZAPRINE HCL 5 MG PO TABS: ORAL_TABLET | ORAL | 0 refills | Status: DC

## 2019-03-25 MED ORDER — KETOROLAC TROMETHAMINE 60 MG/2ML IM SOLN
60.0000 mg | Freq: Once | INTRAMUSCULAR | Status: AC
  Administered 2019-03-25: 60 mg via INTRAMUSCULAR

## 2019-03-25 NOTE — Patient Instructions (Signed)
1- Call you pain Dr to have your stimulator checked.  2- call you gastroenterologist to request a change of medication to [place] since he/she placed you on Amitiza and is no longer helping you.   3- If you get worse, please go to the ER.

## 2019-03-25 NOTE — Progress Notes (Signed)
Subjective:     Patient ID: Linda Mercado , female    DOB: 07-10-1966 , 53 y.o.   MRN: [id number]   Chief Complaint  Patient presents with  . Fall    HPI Pt fell 2 days ago. She states she was entering her home as she dropped her back pack on the couch she felt numbness on her L lower leg and fell down on the floor and hit her head on the wooden floor. She did not seek medical care. Pt is not sure if she fainted, she recalls hitting the wood floor and landing on her L side of her body and hitting her head. Recalls her daughter laying on her chest crying.   Now is having more pain on her L shoulder, trap area. As well as lower back and the area where the stimulator is. Her hips are also hurting. While home she had to walk with assistance. Today she is using her walker. She is having sharp pains on her legs and up her spine to her neck with shocking pains off and on. She feels there trap muscles are tight and go into spasms as well as her lower back. She has not called her pain Dr to have her stimulator checked.     Past Medical History:  Diagnosis Date  . Bradycardia   . Chronic back pain   . Diabetes mellitus without complication ([place])   . Hypertension   . Hypokalemia      Family History  Problem Relation Age of Onset  . Diabetes Father   . Hypertension Father   . Diabetes Other   . Hypertension Other   . Stroke Neg Hx   . Cancer Neg Hx   . CAD Neg Hx      Current Outpatient Medications:  .  albuterol (VENTOLIN HFA) 108 (90 Base) MCG/ACT inhaler, Inhale 2 puffs into the lungs every 6 (six) hours as needed for wheezing or shortness of breath., Disp: 3 g, Rfl: 1 .  AMITIZA 24 MCG capsule, 24 mcg 2 (two) times daily., Disp: , Rfl:  .  atenolol (TENORMIN) 100 MG tablet, TAKE 1 TABLET(100 MG) BY MOUTH DAILY, Disp: 30 tablet, Rfl: 0 .  atorvastatin (LIPITOR) 20 MG tablet, Take 1 tablet (20 mg total) by mouth at bedtime., Disp: 90 tablet, Rfl: 0 .  calcium gluconate 500 MG tablet,  Take one Monday through Friday only., Disp: 90 tablet, Rfl: 0 .  clonazePAM (KLONOPIN) 1 MG tablet, Take 1 mg by mouth 3 (three) times daily., Disp: , Rfl:  .  Cyanocobalamin (B-12) 500 MCG SUBL, One SL QD, Disp: 90 tablet, Rfl: 1 .  DEXILANT 60 MG capsule, Take 1 capsule (60 mg total) by mouth daily., Disp: 90 capsule, Rfl: 0 .  diclofenac sodium (VOLTAREN) 1 % GEL, Apply 4 g topically 4 (four) times daily., Disp: 400 g, Rfl: 1 .  divalproex (DEPAKOTE ER) 500 MG 24 hr tablet, Take 1,000 mg by mouth daily. , Disp: , Rfl: 1 .  docusate sodium (COLACE) 50 MG capsule, Take 50 mg by mouth at bedtime., Disp: , Rfl:  .  fluticasone (FLOVENT HFA) 110 MCG/ACT inhaler, Inhale 2 puffs into the lungs every 12 (twelve) hours., Disp: , Rfl:  .  FREESTYLE LITE test strip, CHECK BLOOD SUGAR 3 TO 4 TIMES DAILY, Disp: 150 each, Rfl: 11 .  hydrochlorothiazide (HYDRODIURIL) 12.5 MG tablet, Take 1 tablet by mouth once daily., Disp: 90 tablet, Rfl: 0 .  hydrOXYzine (ATARAX/VISTARIL) 25  MG tablet, Take 25 mg by mouth 2 (two) times daily. , Disp: , Rfl:  .  losartan (COZAAR) 25 MG tablet, Take 1 tablet (25 mg total) by mouth daily., Disp: 90 tablet, Rfl: 1 .  metFORMIN (GLUCOPHAGE) 500 MG tablet, Take 1 tablet (500 mg total) by mouth 2 (two) times daily with a meal., Disp: 180 tablet, Rfl: 1 .  NARCAN 4 MG/0.1ML LIQD nasal spray kit, Place 1 spray into the nose as directed., Disp: , Rfl: 0 .  Norgestimate-Ethinyl Estradiol Triphasic (TRI-ESTARYLLA) 0.18/0.215/0.25 MG-35 MCG tablet, Take 1 tablet by mouth daily., Disp: 1 Package, Rfl: 0 .  polyethylene glycol powder (GLYCOLAX/MIRALAX) 17 GM/SCOOP powder, Mix 17 grams (1 capful) with 8 ounces of fluid and take by mouth daily for bowels., Disp: 255 g, Rfl: 1 .  promethazine (PHENERGAN) 25 MG tablet, Take 1 tablet (25 mg total) by mouth every 6 (six) hours as needed for nausea or vomiting., Disp: 30 tablet, Rfl: 2 .  QUEtiapine (SEROQUEL XR) 400 MG 24 hr tablet, Take 400 mg  by mouth 2 (two) times daily., Disp: , Rfl:  .  traZODone (DESYREL) 100 MG tablet, 5 qhs prescribed by psychiatry, Disp: 30 tablet, Rfl: 0 .  triamcinolone cream (KENALOG) 0.1 %, Apply 1 application topically daily as needed (for itching)., Disp: 30 g, Rfl: 2 .  Vitamin D, Ergocalciferol, (DRISDOL) 1.25 MG (50000 UT) CAPS capsule, One twice a week with a fatty meal, Disp: 24 capsule, Rfl: 0 .  vortioxetine HBr (TRINTELLIX) 10 MG TABS tablet, Take 10 mg by mouth at bedtime., Disp: , Rfl:  .  oxyCODONE (OXY IR/ROXICODONE) 5 MG immediate release tablet, Take 5 mg by mouth 4 (four) times daily., Disp: , Rfl: 0 .  VRAYLAR 6 MG CAPS, Take 1 capsule by mouth daily., Disp: , Rfl:    Allergies  Allergen Reactions  . Lisinopril Hives    Other reaction(s): anaphylaxis/angioedema  . Latex Hives  . Adhesive [Tape] Rash  . Sulfamethoxazole-Trimethoprim Diarrhea    Other reaction(s): gi distress     Review of Systems  + constipation, dizziness, no V, and no more nausea than her usual. Denies urinary or bowel incontinence. No vision changes or any worse paresthesia than her usual.  Her L lateral ribs feel sore.  Today's Vitals   03/25/19 1124  BP: 128/84  Pulse: 63  Temp: 97.9 F (36.6 C)  TempSrc: Oral  PainSc: 10-Worst pain ever  PainLoc: Back   There is no height or weight on file to calculate BMI.   Objective:  Physical Exam Vitals signs and nursing note reviewed.  Constitutional:      General: She is in acute distress.     Appearance: She is not ill-appearing or diaphoretic.     Comments: Seems in pain  HENT:     Head: Atraumatic.     Right Ear: Tympanic membrane, ear canal and external ear normal.     Left Ear: Tympanic membrane, ear canal and external ear normal.     Mouth/Throat:     Mouth: Mucous membranes are moist.  Eyes:     General: No scleral icterus.    Extraocular Movements: Extraocular movements intact.     Conjunctiva/sclera: Conjunctivae normal.     Pupils: Pupils  are equal, round, and reactive to light.  Neck:     Musculoskeletal: Neck supple. Muscular tenderness present. No neck rigidity.     Comments: Has tense and tender bilateral trapezius muscles L>R. Has tenderness all over her  soft tissue on her back and over the stimulator, but there is no ecchymosis or abrasions anywhere.  Cardiovascular:     Rate and Rhythm: Normal rate and regular rhythm.  Pulmonary:     Breath sounds: Normal breath sounds.     Comments: Has tenderness on L lateral mid ribs, no crepitations palpated, no echymosis or swelling noted.  Chest:     Chest wall: Tenderness present.  Abdominal:     General: Bowel sounds are normal.     Palpations: Abdomen is soft.     Tenderness: There is no abdominal tenderness. There is no guarding.  Musculoskeletal:        General: Tenderness present. No swelling, deformity or signs of injury.     Right lower leg: No edema.     Left lower leg: No edema.     Comments: As noted  Lymphadenopathy:     Cervical: No cervical adenopathy.  Skin:    General: Skin is warm.     Findings: No bruising, erythema, lesion or rash.  Neurological:     Mental Status: She is alert and oriented to person, place, and time.     Sensory: No sensory deficit.     Motor: No weakness.     Comments: Uses a walker to ambulate  Psychiatric:        Mood and Affect: Mood normal.        Behavior: Behavior normal.        Thought Content: Thought content normal.        Judgment: Judgment normal.     Assessment And Plan:    1. Contusion of rib on left side, initial encounter - ketorolac (TORADOL) injection 60 mg - DG Ribs Unilateral Left; Future  2. Fall, initial encounter- with hx of recurrent falls  3. Cervical paraspinal muscle spasm- acute     Advised to use ice and heat alternation for 15 min 2-4 times a day. So stretches after the heat. I placed her on Flexeril for muscle spasms.   4. Strain of lumbar region, initial encounter- acute.    Advised to  call her pain Dr to have him check the status of her stimulator.     She was given Toradol 60 mg IM.  Advised if she get worse, develops N/V, vision changes, confusion she needs to go to ER.  I will call her when I receive the report of her xray.     Dreyden Rohrman RODRIGUEZ[place]TH[place], PA-C    THE PATIENT IS ENCOURAGED TO PRACTICE SOCIAL DISTANCING DUE TO THE COVID-19 PANDEMIC.

## 2019-03-26 ENCOUNTER — Ambulatory Visit: Payer: Medicare Other | Admitting: Internal Medicine

## 2019-03-26 NOTE — Progress Notes (Deleted)
Name: Linda Mercado  MRN/ DOB: [id number], Feb 22, 1966    Age/ Sex: 53 y.o., female    PCP: Colin Benton   Reason for Endocrinology Evaluation: ***     Date of Initial Endocrinology Evaluation: 03/26/2019     HPI: Linda Mercado is a 53 y.o. female with a past medical history of ***. The patient presented for initial endocrinology clinic visit on 03/26/2019 for consultative assistance with her ***.   ***  HISTORY:  Past Medical History:  Past Medical History:  Diagnosis Date  . Bradycardia   . Chronic back pain   . Diabetes mellitus without complication ([place])   . Hypertension   . Hypokalemia     Past Surgical History:  Past Surgical History:  Procedure Laterality Date  . BACK SURGERY        Social History:  reports that she has never smoked. She has never used smokeless tobacco. She reports previous alcohol use. She reports that she does not use drugs.  Family History: family history includes Diabetes in her father and another family member; Hypertension in her father and another family member.   HOME MEDICATIONS: Allergies as of 03/26/2019      Reactions   Lisinopril Hives   Other reaction(s): anaphylaxis/angioedema   Latex Hives   Adhesive [tape] Rash   Sulfamethoxazole-trimethoprim Diarrhea   Other reaction(s): gi distress      Medication List       Accurate as of March 26, 2019 12:57 PM. If you have any questions, ask your nurse or doctor.        albuterol 108 (90 Base) MCG/ACT inhaler Commonly known as: VENTOLIN HFA Inhale 2 puffs into the lungs every 6 (six) hours as needed for wheezing or shortness of breath.   Amitiza 24 MCG capsule Generic drug: lubiprostone 24 mcg 2 (two) times daily.   atenolol 100 MG tablet Commonly known as: TENORMIN TAKE 1 TABLET(100 MG) BY MOUTH DAILY   atorvastatin 20 MG tablet Commonly known as: LIPITOR Take 1 tablet (20 mg total) by mouth at bedtime.   B-12 500 MCG Subl One SL QD    calcium gluconate 500 MG tablet Take one Monday through Friday only.   CAPTOPRIL PO Take 1 tablet by mouth at bedtime.   clonazePAM 1 MG tablet Commonly known as: KLONOPIN Take 1 mg by mouth 3 (three) times daily.   cyclobenzaprine 5 MG tablet Commonly known as: FLEXERIL 1-2 tid prn muscle spasm   Dexilant 60 MG capsule Generic drug: dexlansoprazole Take 1 capsule (60 mg total) by mouth daily.   diclofenac sodium 1 % Gel Commonly known as: VOLTAREN Apply 4 g topically 4 (four) times daily.   divalproex 500 MG 24 hr tablet Commonly known as: DEPAKOTE ER Take 1,000 mg by mouth daily.   docusate sodium 50 MG capsule Commonly known as: COLACE Take 50 mg by mouth at bedtime.   fluticasone 110 MCG/ACT inhaler Commonly known as: FLOVENT HFA Inhale 2 puffs into the lungs every 12 (twelve) hours.   FREESTYLE LITE test strip Generic drug: glucose blood CHECK BLOOD SUGAR 3 TO 4 TIMES DAILY   hydrochlorothiazide 12.5 MG tablet Commonly known as: HYDRODIURIL Take 1 tablet by mouth once daily.   HYDROmorphone 4 MG tablet Commonly known as: DILAUDID Take 4 mg by mouth 4 (four) times daily as needed for severe pain.   hydrOXYzine 25 MG tablet Commonly known as: ATARAX/VISTARIL Take 25 mg by mouth 2 (two) times daily.  losartan 25 MG tablet Commonly known as: COZAAR Take 1 tablet (25 mg total) by mouth daily.   metFORMIN 500 MG tablet Commonly known as: GLUCOPHAGE Take 1 tablet (500 mg total) by mouth 2 (two) times daily with a meal.   Narcan 4 MG/0.1ML Liqd nasal spray kit Generic drug: naloxone Place 1 spray into the nose as directed.   Norgestimate-Ethinyl Estradiol Triphasic 0.18/0.215/0.25 MG-35 MCG tablet Commonly known as: Tri-Estarylla Take 1 tablet by mouth daily.   polyethylene glycol powder 17 GM/SCOOP powder Commonly known as: GLYCOLAX/MIRALAX Mix 17 grams (1 capful) with 8 ounces of fluid and take by mouth daily for bowels.   promethazine 25 MG  tablet Commonly known as: PHENERGAN Take 1 tablet (25 mg total) by mouth every 6 (six) hours as needed for nausea or vomiting.   QUEtiapine 400 MG 24 hr tablet Commonly known as: SEROQUEL XR Take 400 mg by mouth 2 (two) times daily.   traZODone 100 MG tablet Commonly known as: DESYREL 5 qhs prescribed by psychiatry   triamcinolone cream 0.1 % Commonly known as: KENALOG Apply 1 application topically daily as needed (for itching).   Trintellix 10 MG Tabs tablet Generic drug: vortioxetine HBr Take 10 mg by mouth at bedtime.   Vitamin D (Ergocalciferol) 1.25 MG (50000 UT) Caps capsule Commonly known as: DRISDOL One twice a week with a fatty meal   Vraylar 6 MG Caps Generic drug: Cariprazine HCl Take 1 capsule by mouth daily.         REVIEW OF SYSTEMS: A comprehensive ROS was conducted with the patient and is negative except as per HPI and below:  ROS     OBJECTIVE:  VS: There were no vitals taken for this visit.   Wt Readings from Last 3 Encounters:  02/18/19 154 lb 12.8 oz (70.2 kg)  10/06/18 159 lb (72.1 kg)  09/17/18 155 lb (70.3 kg)     EXAM: General: Pt appears well and is in NAD  Hydration: Well-hydrated with moist mucous membranes and good skin turgor  Eyes: External eye exam normal without stare, lid lag or exophthalmos.  EOM intact.  PERRL.  Ears, Nose, Throat: Hearing: Grossly intact bilaterally Dental: Good dentition  Throat: Clear without mass, erythema or exudate  Neck: General: Supple without adenopathy. Thyroid: Thyroid size normal.  No goiter or nodules appreciated. No thyroid bruit.  Lungs: Clear with good BS bilat with no rales, rhonchi, or wheezes  Heart: Auscultation: RRR.  Abdomen: Normoactive bowel sounds, soft, nontender, without masses or organomegaly palpable  Extremities: Gait and station: Normal gait  Digits and nails: No clubbing, cyanosis, petechiae, or nodes Head and neck: Normal alignment and mobility BL UE: Normal ROM and  strength. BL LE: No pretibial edema normal ROM and strength.  Skin: Hair: Texture and amount normal with gender appropriate distribution Skin Inspection: No rashes, acanthosis nigricans/skin tags. No lipohypertrophy Skin Palpation: Skin temperature, texture, and thickness normal to palpation  Neuro: Cranial nerves: II - XII grossly intact  Cerebellar: Normal coordination and movement; no tremor Motor: Normal strength throughout DTRs: 2+ and symmetric in UE without delay in relaxation phase  Mental Status: Judgment, insight: Intact Orientation: Oriented to time, place, and person Memory: Intact for recent and remote events Mood and affect: No depression, anxiety, or agitation     DATA REVIEWED:   Results for Linda Mercado, Linda Mercado (MRN [id number]) as of 03/26/2019 12:57  Ref. Range 02/18/2019 12:44 02/18/2019 12:47 02/23/2019 15:58  TSH Latest Ref Range: 0.450 - 4.500 uIU/mL  1.660   Triiodothyronine,Free,Serum Latest Ref Range: 2.0 - 4.4 pg/mL   2.8  T4,Free(Direct) Latest Ref Range: 0.82 - 1.77 ng/dL   1.16  Thyroperoxidase Ab SerPl-aCnc Latest Ref Range: 0 - 34 IU/mL 514 (H)    Thyroglobulin Antibody Latest Ref Range: 0.0 - 0.9 IU/mL 543.5 (H)      ASSESSMENT/PLAN/RECOMMENDATIONS:   1. Hashimoto's Disease:     Medications :  Signed electronically by: Mack Guise, MD  [facility] Endocrinology  [facility] [address], [place], [place] [address] Phone: [phone] FAX: [phone]   CC: Shelby Mattocks, Hershal Coria [address] [place] [address] Phone: [phone] Fax: [phone]   Return to Endocrinology clinic as below: Future Appointments  Date Time Provider [place]  03/26/2019  3:00 PM Satish Hammers, Melanie Crazier, MD [facility] None  03/31/2019  9:45 AM Bo Merino, MD [facility] None  04/28/2019 10:15 AM Bo Merino, MD [facility] None  04/29/2019  2:00 PM Rodriguez-Southworth, [name], PA-C  [facility] None

## 2019-03-29 ENCOUNTER — Encounter: Payer: Self-pay | Admitting: Internal Medicine

## 2019-03-31 ENCOUNTER — Encounter: Payer: Self-pay | Admitting: Rheumatology

## 2019-03-31 ENCOUNTER — Ambulatory Visit: Payer: Self-pay

## 2019-03-31 ENCOUNTER — Ambulatory Visit (INDEPENDENT_AMBULATORY_CARE_PROVIDER_SITE_OTHER): Payer: Medicare Other | Admitting: Rheumatology

## 2019-03-31 ENCOUNTER — Other Ambulatory Visit: Payer: Self-pay

## 2019-03-31 VITALS — BP 139/91 | HR 76 | Ht 63.0 in | Wt 160.6 lb

## 2019-03-31 DIAGNOSIS — M79642 Pain in left hand: Secondary | ICD-10-CM | POA: Diagnosis not present

## 2019-03-31 DIAGNOSIS — E559 Vitamin D deficiency, unspecified: Secondary | ICD-10-CM

## 2019-03-31 DIAGNOSIS — M79641 Pain in right hand: Secondary | ICD-10-CM

## 2019-03-31 DIAGNOSIS — R296 Repeated falls: Secondary | ICD-10-CM

## 2019-03-31 DIAGNOSIS — M791 Myalgia, unspecified site: Secondary | ICD-10-CM

## 2019-03-31 DIAGNOSIS — M2550 Pain in unspecified joint: Secondary | ICD-10-CM

## 2019-03-31 DIAGNOSIS — M79671 Pain in right foot: Secondary | ICD-10-CM

## 2019-03-31 DIAGNOSIS — M79672 Pain in left foot: Secondary | ICD-10-CM | POA: Diagnosis not present

## 2019-03-31 DIAGNOSIS — M25561 Pain in right knee: Secondary | ICD-10-CM

## 2019-03-31 DIAGNOSIS — R768 Other specified abnormal immunological findings in serum: Secondary | ICD-10-CM | POA: Diagnosis not present

## 2019-03-31 DIAGNOSIS — M25562 Pain in left knee: Secondary | ICD-10-CM

## 2019-03-31 DIAGNOSIS — Z981 Arthrodesis status: Secondary | ICD-10-CM

## 2019-03-31 DIAGNOSIS — K219 Gastro-esophageal reflux disease without esophagitis: Secondary | ICD-10-CM

## 2019-03-31 DIAGNOSIS — G8929 Other chronic pain: Secondary | ICD-10-CM

## 2019-03-31 DIAGNOSIS — F319 Bipolar disorder, unspecified: Secondary | ICD-10-CM

## 2019-03-31 DIAGNOSIS — E119 Type 2 diabetes mellitus without complications: Secondary | ICD-10-CM

## 2019-03-31 DIAGNOSIS — I10 Essential (primary) hypertension: Secondary | ICD-10-CM

## 2019-03-31 DIAGNOSIS — G894 Chronic pain syndrome: Secondary | ICD-10-CM

## 2019-04-01 ENCOUNTER — Other Ambulatory Visit: Payer: Self-pay | Admitting: Internal Medicine

## 2019-04-01 ENCOUNTER — Other Ambulatory Visit: Payer: Self-pay

## 2019-04-01 DIAGNOSIS — E559 Vitamin D deficiency, unspecified: Secondary | ICD-10-CM

## 2019-04-05 ENCOUNTER — Encounter: Payer: Self-pay | Admitting: Internal Medicine

## 2019-04-05 ENCOUNTER — Other Ambulatory Visit: Payer: Self-pay

## 2019-04-05 ENCOUNTER — Ambulatory Visit (INDEPENDENT_AMBULATORY_CARE_PROVIDER_SITE_OTHER): Payer: Medicare Other | Admitting: Internal Medicine

## 2019-04-05 VITALS — BP 110/80 | HR 74 | Temp 98.2°F | Ht 63.0 in | Wt 155.0 lb

## 2019-04-05 DIAGNOSIS — E063 Autoimmune thyroiditis: Secondary | ICD-10-CM | POA: Diagnosis not present

## 2019-04-05 NOTE — Patient Instructions (Signed)
-   You have Hashimoto's Disease, which is an immune condition that affects your thyroid and could make it stop working,at this time , your thyroid works just fine.   - I would recommend having your thyroid function tested once a year at your primary care provider's office

## 2019-04-05 NOTE — Progress Notes (Signed)
Name: Linda Mercado  MRN/ DOB: [id number], 17-Nov-1965    Age/ Sex: 53 y.o., female    PCP: Colin Benton   Reason for Endocrinology Evaluation: Hashimoto's Disease     Date of Initial Endocrinology Evaluation: 04/05/2019     HPI: Linda Mercado is a 53 y.o. female with a past medical history of T2DM, HTN and chronic pain syndrome, schizoaffective disorder .  The patient presented for initial endocrinology clinic visit on 04/05/2019 for consultative assistance with her Hashimoto's Disease.   Pt presented to her PCP with c/o weight gain and myalgia, which prompted an Anti-TPO Ab check which came back elevated.  Today she denies any more weight gain, he weight has been stable.  She feels tired but admits to insomnia   Denies constipation Has been diagnosed with schizoaffective disorder and is under psychiatry care.   Denies local neck symptoms No prior exposure to radiation   No FH of thyroid disease but has a sister with Vitiligo       HISTORY:  Past Medical History:  Past Medical History:  Diagnosis Date  . Bradycardia   . Chronic back pain   . Diabetes mellitus without complication ([place])   . Hypertension   . Hypokalemia    Past Surgical History:  Past Surgical History:  Procedure Laterality Date  . APPENDECTOMY    . BACK SURGERY     x6  . CESAREAN SECTION     x3  . HERNIA REPAIR        Social History:  reports that she has never smoked. She has never used smokeless tobacco. She reports previous alcohol use. She reports that she does not use drugs.  Family History: family history includes Asthma in her daughter, daughter, daughter, and daughter; Bipolar disorder in her brother, brother, daughter, daughter, and sister; Depression in her daughter, daughter, and daughter; Diabetes in her father; Hypertension in her father, mother, and sister; Kidney disease in her mother; Neuropathy in her sister; Post-traumatic stress disorder in her  daughter and daughter; Schizophrenia in her brother and sister.   HOME MEDICATIONS: Allergies as of 04/05/2019      Reactions   Lisinopril Hives   Other reaction(s): anaphylaxis/angioedema   Latex Hives   Adhesive [tape] Rash   Sulfamethoxazole-trimethoprim Diarrhea   Other reaction(s): gi distress      Medication List       Accurate as of April 05, 2019  3:35 PM. If you have any questions, ask your nurse or doctor.        albuterol 108 (90 Base) MCG/ACT inhaler Commonly known as: VENTOLIN HFA Inhale 2 puffs into the lungs every 6 (six) hours as needed for wheezing or shortness of breath.   Amitiza 24 MCG capsule Generic drug: lubiprostone 24 mcg 2 (two) times daily.   atenolol 100 MG tablet Commonly known as: TENORMIN Take 1 tablet by mouth daily.   atorvastatin 20 MG tablet Commonly known as: LIPITOR Take 1 tablet (20 mg total) by mouth at bedtime.   B-12 500 MCG Subl One SL QD   calcium gluconate 500 MG tablet Take one Monday through Friday only.   Caplyta 42 MG Caps Generic drug: Lumateperone Tosylate TK 1 C PO IN THE EVE WF   CAPTOPRIL PO Take 1 tablet by mouth at bedtime.   clonazePAM 1 MG tablet Commonly known as: KLONOPIN Take 1 mg by mouth 3 (three) times daily.   cyclobenzaprine 5 MG tablet Commonly known as: FLEXERIL  1-2 tid prn muscle spasm   Dexilant 60 MG capsule Generic drug: dexlansoprazole Take 1 capsule (60 mg total) by mouth daily.   diclofenac sodium 1 % Gel Commonly known as: VOLTAREN Apply 4 g topically 4 (four) times daily.   divalproex 500 MG 24 hr tablet Commonly known as: DEPAKOTE ER Take 1,000 mg by mouth daily.   docusate sodium 50 MG capsule Commonly known as: COLACE Take 50 mg by mouth at bedtime.   fluticasone 110 MCG/ACT inhaler Commonly known as: FLOVENT HFA Inhale 2 puffs into the lungs every 12 (twelve) hours.   FREESTYLE LITE test strip Generic drug: glucose blood CHECK BLOOD SUGAR 3 TO 4 TIMES  DAILY   hydrochlorothiazide 12.5 MG tablet Commonly known as: HYDRODIURIL Take 1 tablet by mouth once daily.   HYDROmorphone 4 MG tablet Commonly known as: DILAUDID Take 4 mg by mouth 4 (four) times daily as needed for severe pain.   hydrOXYzine 25 MG tablet Commonly known as: ATARAX/VISTARIL Take 25 mg by mouth 2 (two) times daily.   losartan 25 MG tablet Commonly known as: COZAAR Take 1 tablet (25 mg total) by mouth daily.   metFORMIN 500 MG tablet Commonly known as: GLUCOPHAGE Take 1 tablet (500 mg total) by mouth 2 (two) times daily with a meal.   Narcan 4 MG/0.1ML Liqd nasal spray kit Generic drug: naloxone Place 1 spray into the nose as directed.   Norgestimate-Ethinyl Estradiol Triphasic 0.18/0.215/0.25 MG-35 MCG tablet Commonly known as: Tri-Estarylla Take 1 tablet by mouth daily.   PAIN MANAGEMENT INTRATHECAL (IT) PUMP 1 each by Intrathecal route. Intrathecal (IT) medication: fentanyl, clonidine,   polyethylene glycol powder 17 GM/SCOOP powder Commonly known as: GLYCOLAX/MIRALAX Mix 17 grams (1 capful) with 8 ounces of fluid and take by mouth daily for bowels.   promethazine 25 MG tablet Commonly known as: PHENERGAN Take 1 tablet (25 mg total) by mouth every 6 (six) hours as needed for nausea or vomiting.   QUEtiapine 400 MG 24 hr tablet Commonly known as: SEROQUEL XR Take 400 mg by mouth 2 (two) times daily.   traZODone 100 MG tablet Commonly known as: DESYREL 5 qhs prescribed by psychiatry   triamcinolone cream 0.1 % Commonly known as: KENALOG Apply 1 application topically daily as needed (for itching).   Trintellix 10 MG Tabs tablet Generic drug: vortioxetine HBr Take 10 mg by mouth at bedtime.   Vitamin D (Ergocalciferol) 1.25 MG (50000 UT) Caps capsule Commonly known as: DRISDOL One twice a week with a fatty meal   Vraylar 6 MG Caps Generic drug: Cariprazine HCl Take 1 capsule by mouth daily.         REVIEW OF SYSTEMS: A  comprehensive ROS was conducted with the patient and is negative except as per HPI and below:  Review of Systems  Constitutional: Positive for malaise/fatigue and weight loss. Negative for fever.  HENT: Negative for congestion and sore throat.   Eyes: Negative for blurred vision and pain.  Respiratory: Negative for cough and shortness of breath.   Cardiovascular: Negative for chest pain and palpitations.  Gastrointestinal: Negative for constipation and nausea.  Musculoskeletal: Positive for joint pain and myalgias.  Neurological: Positive for dizziness and headaches.  Psychiatric/Behavioral: Positive for depression. The patient is nervous/anxious.        OBJECTIVE:  VS: BP 110/80 (BP Location: Left Arm, Patient Position: Sitting, Cuff Size: Normal)   Pulse 74   Temp 98.2 F (36.8 C) (Oral)   Ht 5' 3"  (1.6 m)  Wt 155 lb (70.3 kg)   SpO2 99%   BMI 27.46 kg/m    Wt Readings from Last 3 Encounters:  04/05/19 155 lb (70.3 kg)  03/31/19 160 lb 9.6 oz (72.8 kg)  02/18/19 154 lb 12.8 oz (70.2 kg)     EXAM: General: Pt appears well and is in NAD  Hydration: Well-hydrated with moist mucous membranes and good skin turgor  Eyes: External eye exam normal without stare, lid lag or exophthalmos.  EOM intact.  PERRL.  Ears, Nose, Throat: Hearing: Grossly intact bilaterally Dental: Good dentition  Throat: Clear without mass, erythema or exudate  Neck: General: Supple without adenopathy. Thyroid: Thyroid size normal.  No goiter or nodules appreciated. No thyroid bruit.  Lungs: Clear with good BS bilat with no rales, rhonchi, or wheezes  Heart: Auscultation: RRR.  Abdomen: Normoactive bowel sounds, soft, nontender, without masses or organomegaly palpable  Extremities:  BL LE: No pretibial edema normal ROM and strength.  Skin: Hair: Texture and amount normal with gender appropriate distribution Skin Inspection: No rashes Skin Palpation: Skin temperature, texture, and thickness normal to  palpation  Neuro: Cranial nerves: II - XII grossly intact  Motor: Normal strength throughout DTRs: 2+ and symmetric in UE without delay in relaxation phase  Mental Status: Judgment, insight: Intact Orientation: Oriented to time, place, and person Mood and affect: No depression, anxiety, or agitation     DATA REVIEWED:   Results for TAKERIA, MARQUINA (MRN [id number]) as of 04/05/2019 13:05  Ref. Range 02/18/2019 12:44 02/18/2019 12:47 02/23/2019 15:58  TSH Latest Ref Range: 0.450 - 4.500 uIU/mL  1.660   Triiodothyronine,Free,Serum Latest Ref Range: 2.0 - 4.4 pg/mL   2.8  T4,Free(Direct) Latest Ref Range: 0.82 - 1.77 ng/dL   1.16  Thyroperoxidase Ab SerPl-aCnc Latest Ref Range: 0 - 34 IU/mL 514 (H)    Thyroglobulin Antibody Latest Ref Range: 0.0 - 0.9 IU/mL 543.5 (H)      ASSESSMENT/PLAN/RECOMMENDATIONS:   1. Hashimoto's Disease:   - Pt is clinically and biochemically euthyroid  - Pt with multiple non-specific symptoms that are NOT attributed to her thyroid.  - I explained to the patient that Hashimoto's Disease is an autoimmune - mediated destruction of the thyroid gland. The usual course of Hashimoto's thyroiditis is the gradual loss of thyroid function. Overt hypothyroidism occurs at a rate of ~ 5% per year.  - I recommend against the routine testing for anti-TPO antibodies in the setting of normal thyroid function , as it will not change the management    Recommendations :  Annual TFT's at PCP 's office and start LT-4 replacement when TFT's become abnormal.     F/u PRN    Signed electronically by: Mack Guise, MD  [facility] Endocrinology  [facility] [address] [place], [place] [address] Phone: [phone] FAX: [phone]   CC: Shelby Mattocks, [place] [address] Phone: [phone] Fax: [phone]   Return to Endocrinology clinic as below: Future Appointments  Date Time  Provider [place]  04/06/2019 12:30 PM TIMA-CCM CASE MANAGER [facility] None  04/28/2019 10:15 AM Bo Merino, MD [facility] None  04/28/2019  3:00 PM TIMA-CCM CASE MANAGER [facility] None  04/29/2019  2:00 PM Rodriguez-Southworth, [name], PA-C [facility] None

## 2019-04-06 ENCOUNTER — Ambulatory Visit (INDEPENDENT_AMBULATORY_CARE_PROVIDER_SITE_OTHER): Payer: Medicare Other

## 2019-04-06 ENCOUNTER — Telehealth: Payer: Medicare Other

## 2019-04-06 DIAGNOSIS — I10 Essential (primary) hypertension: Secondary | ICD-10-CM

## 2019-04-06 DIAGNOSIS — M62838 Other muscle spasm: Secondary | ICD-10-CM

## 2019-04-06 DIAGNOSIS — E063 Autoimmune thyroiditis: Secondary | ICD-10-CM

## 2019-04-06 DIAGNOSIS — S39012A Strain of muscle, fascia and tendon of lower back, initial encounter: Secondary | ICD-10-CM

## 2019-04-06 DIAGNOSIS — E119 Type 2 diabetes mellitus without complications: Secondary | ICD-10-CM

## 2019-04-07 NOTE — Patient Instructions (Signed)
Visit Information  Goals Addressed      Patient Stated   . "I may make my aunt my power of attorney" (pt-stated)       Current Barriers:  [name] Knowledge Deficits related to how to complete Advanced directives  . No Advanced Directives in place  Nurse Case Manager Clinical Goal(s):  [name] Over the next 30 days, patient will verbalize understanding of plan for completion of Advanced Directives  CCM RN CM Interventions:  04/07/19 call completed with patient  . Provided education to patient re: how to complete Advanced directive packet . Discussed plans with patient for ongoing care management follow up and provided patient with direct contact information for care management team . Provided patient with paper Advanced directive packet and instructed on completion  Patient Self Care Activities:  . Currently UNABLE TO independently perform self care  Initial goal documentation     . "My bones hurt" "The pain pump is not helping" (pt-stated)   On track    Current Barriers:  [name] Knowledge Deficits related to unknown etiology for chronic debilitating musculoskeletal and bone Pain  . Lacks caregiver support  Nurse Case Manager Clinical Goal(s):  [name] Over the next 120 days, patient will verbalize understanding of plan for evaluation of and treatment for pain. (Revised goal target date due to COVID-19 delays in care) 12/10/18: Target Goal date revised to 120 days due to COVID-19 treatment delays. AL       Goal Met  . 04/06/19 Over the next 30 days, patient will verbalize increased understanding related to the disease process and how to Self manage Hashimoto's disease. [name] 04/06/19 Over the next 30 days, patient will adhere to following all MD recommendations provided by the Rheumatologist.   CCM RN CM Interventions:  04/06/19 call completed with patient   . Evaluation of current treatment plan related to chronic pain management and patient's adherence to plan as established by provider. . Provided  education to patient re: disease process for Hashimoto's Disease; discussed potential triggers and how to Self manage; discussed recommended frequency of MD follow up and when to call the doctor if needed . Reviewed medications with patient and discussed patient's ongoing follow up with her Pain management doctor; discussed patient's next OV is scheduled for 04/09/19; patient continues to be debilitating and uncontrolled with use of intrathecal pain pump . Discussed plans with patient for ongoing care management follow up and provided patient with direct contact information for care management team . Provided patient with printed educational materials related to Hashimoto's disease . Reviewed scheduled/upcoming provider appointments including: next follow up appointment with Dr. Estanislado Pandy is scheduled for 04/28/19 @10 :15 AM; Reviewed and discussed patients initial evaluation including imaging and additional labs ordered  Patient Self Care Activities: . Currently UNABLE TO independently perform self care   Please see past updates related to this goal by clicking on the "Past Updates" button in the selected goal         The patient verbalized understanding of instructions provided today and declined a print copy of patient instruction materials.   Telephone follow up appointment with care management team member scheduled for: 04/28/19  Barb Merino, RN, BSN, CCM Care Management Coordinator [place]  Direct Phone: [phone]

## 2019-04-07 NOTE — Chronic Care Management (AMB) (Addendum)
Chronic Care Management   Follow Up Note   04/06/2019 Name: Linda Mercado MRN: [id number] DOB: Sep 03, 1965  Referred by: Linda Mattocks, PA-C Reason for referral : Chronic Care Management (CCM RNCM Telephone Follow up )   Linda Mercado is a 53 y.o. year old female who is a primary care patient of Linda Mercado, Linda Mercado, [place]. The CCM team was consulted for assistance with chronic disease management and care coordination needs.    Review of patient status, including review of consultants reports, relevant laboratory and other test results, and collaboration with appropriate care team members and the patient's provider was performed as part of comprehensive patient evaluation and provision of chronic care management services.    SDOH (Social Determinants of Health) screening performed today: Advanced Directives. See Care Plan for related entries.   Advanced Directives Status: N See Care Plan and Vyn[employer] application for related entries.  I spoke with Linda Mercado by telephone today to follow up on her pain management and MD follow up with Endocrinology and Rheumatology.   Outpatient Encounter Medications as of 04/06/2019  Medication Sig Note  . albuterol (VENTOLIN HFA) 108 (90 Base) MCG/ACT inhaler Inhale 2 puffs into the lungs every 6 (six) hours as needed for wheezing or shortness of breath.   . AMITIZA 24 MCG capsule 24 mcg 2 (two) times daily.   [name] atenolol (TENORMIN) 100 MG tablet Take 1 tablet by mouth daily.   [name] atorvastatin (LIPITOR) 20 MG tablet Take 1 tablet (20 mg total) by mouth at bedtime.   . calcium gluconate 500 MG tablet Take one Monday through Friday only.   . CAPLYTA 42 MG CAPS TK 1 C PO IN THE EVE WF   . CAPTOPRIL PO Take 1 tablet by mouth at bedtime.   . clonazePAM (KLONOPIN) 1 MG tablet Take 1 mg by mouth 3 (three) times daily.   . Cyanocobalamin (B-12) 500 MCG SUBL One SL QD   . cyclobenzaprine (FLEXERIL) 5 MG tablet 1-2 tid prn muscle spasm   .  DEXILANT 60 MG capsule Take 1 capsule (60 mg total) by mouth daily.   . diclofenac sodium (VOLTAREN) 1 % GEL Apply 4 g topically 4 (four) times daily.   . divalproex (DEPAKOTE ER) 500 MG 24 hr tablet Take 1,000 mg by mouth daily.  03/18/2018: Strength and sig verified by daughter, [facility], and [facility]  . docusate sodium (COLACE) 50 MG capsule Take 50 mg by mouth at bedtime.   . fluticasone (FLOVENT HFA) 110 MCG/ACT inhaler Inhale 2 puffs into the lungs every 12 (twelve) hours.   [name] FREESTYLE LITE test strip CHECK BLOOD SUGAR 3 TO 4 TIMES DAILY   . hydrochlorothiazide (HYDRODIURIL) 12.5 MG tablet Take 1 tablet by mouth once daily.   [name] HYDROmorphone (DILAUDID) 4 MG tablet Take 4 mg by mouth 4 (four) times daily as needed for severe pain.   . hydrOXYzine (ATARAX/VISTARIL) 25 MG tablet Take 25 mg by mouth 2 (two) times daily.    [name] losartan (COZAAR) 25 MG tablet Take 1 tablet (25 mg total) by mouth daily.   . metFORMIN (GLUCOPHAGE) 500 MG tablet Take 1 tablet (500 mg total) by mouth 2 (two) times daily with a meal.   . NARCAN 4 MG/0.1ML LIQD nasal spray kit Place 1 spray into the nose as directed.   . Norgestimate-Ethinyl Estradiol Triphasic (TRI-ESTARYLLA) 0.18/0.215/0.25 MG-35 MCG tablet Take 1 tablet by mouth daily.   [name] PAIN MANAGEMENT INTRATHECAL, IT, PUMP 1 each by Intrathecal route. Intrathecal (IT)  medication: fentanyl, clonidine,   . polyethylene glycol powder (GLYCOLAX/MIRALAX) 17 GM/SCOOP powder Mix 17 grams (1 capful) with 8 ounces of fluid and take by mouth daily for bowels.   . promethazine (PHENERGAN) 25 MG tablet Take 1 tablet (25 mg total) by mouth every 6 (six) hours as needed for nausea or vomiting.   [name] QUEtiapine (SEROQUEL XR) 400 MG 24 hr tablet Take 400 mg by mouth 2 (two) times daily.   . traZODone (DESYREL) 100 MG tablet 5 qhs prescribed by psychiatry   . triamcinolone cream (KENALOG) 0.1 % Apply 1 application topically daily as needed (for itching).   . Vitamin D,  Ergocalciferol, (DRISDOL) 1.25 MG (50000 UT) CAPS capsule One twice a week with a fatty meal   . vortioxetine HBr (TRINTELLIX) 10 MG TABS tablet Take 10 mg by mouth at bedtime.   [name] VRAYLAR 6 MG CAPS Take 1 capsule by mouth daily.    No facility-administered encounter medications on file as of 04/06/2019.      Goals Addressed      Patient Stated   . "I may make my aunt my power of attorney" (pt-stated)       Current Barriers:  [name] Knowledge Deficits related to how to complete Advanced directives  . No Advanced Directives in place  Nurse Case Manager Clinical Goal(s):  [name] Over the next 30 days, patient will verbalize understanding of plan for completion of Advanced Directives  CCM RN CM Interventions:  04/07/19 call completed with patient  . Provided education to patient re: how to complete Advanced directive packet . Discussed plans with patient for ongoing care management follow up and provided patient with direct contact information for care management team . Provided patient with paper Advanced directive packet and instructed on completion  Patient Self Care Activities:  . Currently UNABLE TO independently perform self care  Initial goal documentation     . "My bones hurt" "The pain pump is not helping" (pt-stated)   On track    Current Barriers:  [name] Knowledge Deficits related to unknown etiology for chronic debilitating musculoskeletal and bone Pain  . Lacks caregiver support  Nurse Case Manager Clinical Goal(s):  [name] Over the next 120 days, patient will verbalize understanding of plan for evaluation of and treatment for pain. (Revised goal target date due to COVID-19 delays in care) 12/10/18: Target Goal date revised to 120 days due to COVID-19 treatment delays. AL       Goal Met  . 04/06/19 Over the next 30 days, patient will verbalize increased understanding related to the disease process and how to Self manage Hashimoto's disease. [name] 04/06/19 Over the next 30 days, patient will  adhere to following all MD recommendations provided by the Rheumatologist.   CCM RN CM Interventions:  04/06/19 call completed with patient   . Evaluation of current treatment plan related to chronic pain management and patient's adherence to plan as established by provider. . Provided education to patient re: disease process for Hashimoto's Disease; discussed potential triggers and how to Self manage; discussed recommended frequency of MD follow up and when to call the doctor if needed . Reviewed medications with patient and discussed patient's ongoing follow up with her Pain management doctor; discussed patient's next OV is scheduled for 04/09/19; patient continues to be debilitating and uncontrolled with use of intrathecal pain pump . Discussed plans with patient for ongoing care management follow up and provided patient with direct contact information for care management team . Provided patient  with printed educational materials related to Hashimoto's disease . Reviewed scheduled/upcoming provider appointments including: next follow up appointment with Dr. Estanislado Pandy is scheduled for 04/28/19 _0 :15 AM; Reviewed and discussed patients initial evaluation including imaging and additional labs ordered  Patient Self Care Activities: . Currently UNABLE TO independently perform self care   Please see past updates related to this goal by clicking on the "Past Updates" button in the selected goal          Telephone follow up appointment with care management team member scheduled for: 04/28/19   Barb Merino, RN, BSN, CCM Care Management Coordinator [place] [facility]  Direct Phone: [phone]

## 2019-04-14 NOTE — Progress Notes (Signed)
Office Visit Note  Patient: Linda Mercado             Date of Birth: 1965-10-07           MRN: [id number]             PCP: Shelby Mattocks, PA-C Referring: Carol Ada* Visit Date: 04/28/2019 Occupation: @GUAROCC @  Subjective:  Knee Pain (Left knee pain, falling)   History of Present Illness: AIDAN STALLSWORTH is a 53 y.o. female with history of chronic pain syndrome.  She states she continues to have generalized pain and discomfort.  She states she has lot of discomfort in her hands and her knee joints.  She states her gait is very unstable and she continues to fall.  She recalls having physical therapy about 2 years ago.  She denies any joint swelling.  There is no history of oral ulcers, nasal ulcers, malar rash.  Activities of Daily Living:  Patient reports morning stiffness for 1 hour.   Patient Denies nocturnal pain.  Difficulty dressing/grooming: Reports Difficulty climbing stairs: Reports Difficulty getting out of chair: Reports Difficulty using hands for taps, buttons, cutlery, and/or writing: Reports  Review of Systems  Constitutional: Positive for fatigue. Negative for night sweats, weight gain and weight loss.  HENT: Negative for mouth sores, trouble swallowing, trouble swallowing, mouth dryness and nose dryness.   Eyes: Positive for dryness. Negative for pain, redness and visual disturbance.  Respiratory: Negative for cough, shortness of breath and difficulty breathing.   Cardiovascular: Positive for swelling in legs/feet. Negative for chest pain, palpitations, hypertension and irregular heartbeat.  Gastrointestinal: Positive for constipation. Negative for blood in stool and diarrhea.  Endocrine: Negative for heat intolerance, excessive thirst, excessive hunger and increased urination.  Genitourinary: Negative for painful urination and vaginal dryness.  Musculoskeletal: Positive for arthralgias, gait problem, joint pain, muscle weakness, morning  stiffness and muscle tenderness. Negative for joint swelling, myalgias and myalgias.  Skin: Negative for color change, rash, hair loss, skin tightness, ulcers and sensitivity to sunlight.  Allergic/Immunologic: Negative for susceptible to infections.  Neurological: Positive for light-headedness and weakness. Negative for dizziness, numbness, memory loss and night sweats.  Hematological: Negative for bruising/bleeding tendency and swollen glands.  Psychiatric/Behavioral: Positive for sleep disturbance. Negative for depressed mood. The patient is not nervous/anxious.     PMFS History:  Patient Active Problem List   Diagnosis Date Noted  . Hashimoto's disease 04/05/2019  . Nausea 11/23/2018  . Frequent falls 09/17/2018  . Skin lesion 09/17/2018  . GERD (gastroesophageal reflux disease)   . Chronic midline low back pain with sciatica 05/27/2018  . Intractable pain 03/20/2018  . Bradycardia   . Chronic pain syndrome 03/18/2018  . 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 Hypokalemia 03/18/2018  . Intractable back pain 01/03/2018  . HTN (hypertension) 01/03/2018  . Type 2 diabetes mellitus without complication ([place]) [id number]  . Vitamin D deficiency 07/11/2017  . Postmenopausal 04/24/2017  . Microalbuminuria 01/14/2017  . Dyspareunia in female 12/10/2016  . Endometriosis determined by laparoscopy 12/10/2016  . History of exploratory laparotomy 12/10/2016  . Pelvic pain 12/10/2016  . Bipolar 1 disorder ([place]) 09/19/2016  . Cyst of right ovary 09/19/2016  . Severe episode of recurrent major depressive disorder, with psychotic features ([place]) 09/19/2016  . Schizoaffective disorder, bipolar type ([place]) 09/19/2016  . Chronic pain 03/05/2016  . Non morbid obesity due to excess calories 03/05/2016  . S/P lumbar fusion 03/05/2016    Past Medical History:  Diagnosis Date  .  Acid reflux   . Bradycardia   . Chronic back pain   . Diabetes mellitus without complication ([place])   .  Hypertension   . Hypokalemia     Family History  Problem Relation Age of Onset  . Diabetes Father   . Hypertension Father   . Kidney disease Mother   . Hypertension Mother   . Bipolar disorder Sister   . Schizophrenia Sister   . Neuropathy Sister   . Hypertension Sister   . Bipolar disorder Brother   . Bipolar disorder Brother   . Schizophrenia Brother   . Bipolar disorder Daughter   . Post-traumatic stress disorder Daughter   . Asthma Daughter   . Depression Daughter   . Bipolar disorder Daughter   . Post-traumatic stress disorder Daughter   . Asthma Daughter   . Depression Daughter   . Asthma Daughter   . Depression Daughter   . Asthma Daughter   . Stroke Neg Hx   . Cancer Neg Hx   . CAD Neg Hx    Past Surgical History:  Procedure Laterality Date  . APPENDECTOMY    . BACK SURGERY     x6  . CESAREAN SECTION     x3  . HERNIA REPAIR     Social History   Social History Narrative   Caffeine- none   Immunization History  Administered Date(s) Administered  . Influenza Inj Mdck Quad With Preservative 05/11/2018  . Influenza,inj,Quad PF,6+ Mos 09/19/2016, 04/24/2017     Objective: Vital Signs: BP 130/78 (BP Location: Left Arm, Patient Position: Sitting, Cuff Size: Normal)   Pulse 65   Resp 14   Ht 5\' 3"  (1.6 m)   Wt 157 lb 3.2 oz (71.3 kg)   LMP 04/19/2019   BMI 27.85 kg/m    Physical Exam Vitals signs and nursing note reviewed.  Constitutional:      Appearance: She is well-developed.  HENT:     Head: Normocephalic and atraumatic.  Eyes:     Conjunctiva/sclera: Conjunctivae normal.  Neck:     Musculoskeletal: Normal range of motion.  Cardiovascular:     Rate and Rhythm: Normal rate and regular rhythm.     Heart sounds: Normal heart sounds.  Pulmonary:     Effort: Pulmonary effort is normal.     Breath sounds: Normal breath sounds.  Abdominal:     General: Bowel sounds are normal.     Palpations: Abdomen is soft.  Lymphadenopathy:     Cervical:  No cervical adenopathy.  Skin:    General: Skin is warm and dry.     Capillary Refill: Capillary refill takes less than 2 seconds.  Neurological:     Mental Status: She is alert and oriented to person, place, and time.  Psychiatric:        Behavior: Behavior normal.      Musculoskeletal Exam: C-spine, thoracic and lumbar spine with good range of motion with discomfort.  She had painful range of motion of her shoulder joints elbow joints wrist joint MCPs PIPs DIPs with no synovitis.  Hip joints, knee joints, ankles were in good range of motion.  She is some crepitus in her knee joints without any warmth swelling or effusion.  CDAI Exam: CDAI Score: - Patient Global: -; Provider Global: - Swollen: -; Tender: - Joint Exam   No joint exam has been documented for this visit   There is currently no information documented on the homunculus. Go to the Rheumatology activity and complete the  homunculus joint exam.  Investigation: No additional findings.  Imaging: Xr Foot 2 Views Left  Result Date: 03/31/2019 Mild first MTP PIP and DIP narrowing was noted.  No erosive changes were noted.  No intertarsal or tibiotalar joint space narrowing was noted. Impression: These findings are consistent with mild osteoarthritis of the foot.  Xr Foot 2 Views Right  Result Date: 03/31/2019 Mild first MTP PIP and DIP narrowing was noted.  No erosive changes were noted.  No intertarsal or tibiotalar joint space narrowing was noted. Impression: These findings are consistent with mild osteoarthritis of the foot.  Xr Hand 2 View Left  Result Date: 03/31/2019 No MCP, PIP DIP or intercarpal joint space narrowing was noted.  No erosive changes were noted.  No juxta-articular osteopenia was noted. Impression: Unremarkable x-ray of the hand.  Xr Hand 2 View Right  Result Date: 03/31/2019 No MCP, PIP DIP or intercarpal joint space narrowing was noted.  No erosive changes were noted.  No juxta-articular osteopenia  was noted. Impression: Unremarkable x-ray of the hand.  Xr Knee 3 View Left  Result Date: 03/31/2019 No medial lateral compartment narrowing was noted.  Mild patellofemoral narrowing was noted.  No chondrocalcinosis was noted. Impression: These findings are consistent with mild chondromalacia patella of the knee.  Xr Knee 3 View Right  Result Date: 03/31/2019 No medial lateral compartment narrowing was noted.  Mild patellofemoral narrowing was noted.  No chondrocalcinosis was noted. Impression: These findings are consistent with mild chondromalacia patella of the knee.   Recent Labs: Lab Results  Component Value Date   WBC 7.2 02/18/2019   HGB 13.7 02/18/2019   PLT 235 02/18/2019   NA 141 09/17/2018   K 3.9 09/17/2018   CL 102 09/17/2018   CO2 22 09/17/2018   GLUCOSE 135 (H) 09/17/2018   BUN 13 09/17/2018   CREATININE 0.85 09/17/2018   BILITOT <0.2 09/17/2018   ALKPHOS 110 09/17/2018   AST 9 09/17/2018   ALT 9 09/17/2018   PROT 7.7 09/17/2018   ALBUMIN 4.6 09/17/2018   CALCIUM 10.5 (H) 09/17/2018   GFRAA 91 09/17/2018  September 9, 2020AVISE index -1.4, ANA 1: 160 centromere, ENA negative, Jo 1-, anticardiolipin negative, beta-2 GP 1-, RF negative, anti-CCP negative, antithyroglobulin positive, anti-TPO positive Speciality Comments: No specialty comments available.  Procedures:  No procedures performed Allergies: Lisinopril, Latex, Adhesive [tape], and Sulfamethoxazole-trimethoprim   Assessment / Plan:     Visit Diagnoses: Positive ANA (antinuclear antibody) - Patient had no clinical features of autoimmune disease. AVISE index was negative.  She has low titer ANA.  ENA panel was completely negative.  She had positive TPO and thyroglobulin antibodies.  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  Have given her a handout on knee exercises.  Have also refer her to physical therapy.  Primary osteoarthritis of both feet - Mild osteoarthritis was noted on the x-rays.  S/P  lumbar fusion-she has chronic lower back pain.  Chronic pain syndrome -she is going to pain management.  She states she is also having constipation from the pain meds.  Have advised her to discuss this further with the pain management.  Frequent falls -she gives history of frequent falls.  Plan: Ambulatory referral to Physical Therapy  Polyarthralgia - X-ray of bilateral hands and knee joints were unremarkable.  Myalgia-most likely myofascial pain.    Bipolar 1 disorder ([place])  Essential hypertension  Type 2 diabetes mellitus without complication, without long-term current use of insulin (HCC)  Gastroesophageal reflux disease  without esophagitis  Vitamin D deficiency  Orders: Orders Placed This Encounter  Procedures  . Ambulatory referral to Physical Therapy   No orders of the defined types were placed in this encounter.    Follow-Up Instructions: Return if symptoms worsen or fail to improve, for Chondromalacia patella.   Bo Merino, MD  Note - This record has been created using Editor, commissioning.  Chart creation errors have been sought, but may not always  have been located. Such creation errors do not reflect on  the standard of medical care.

## 2019-04-19 ENCOUNTER — Other Ambulatory Visit: Payer: Self-pay

## 2019-04-19 ENCOUNTER — Telehealth: Payer: Self-pay

## 2019-04-19 DIAGNOSIS — E119 Type 2 diabetes mellitus without complications: Secondary | ICD-10-CM

## 2019-04-19 MED ORDER — METFORMIN HCL 500 MG PO TABS: 500.0000 mg | ORAL_TABLET | Freq: Two times a day (BID) | ORAL | 1 refills | Status: DC

## 2019-04-19 NOTE — Telephone Encounter (Signed)
Patient daughter called Cas[facility] Bilis left vm asking for results from Rheumatology

## 2019-04-22 ENCOUNTER — Telehealth: Payer: Self-pay

## 2019-04-22 NOTE — Telephone Encounter (Signed)
Returned pt call however pt was not able to speak due to a procedure she recently had, pt daughter wanted [place] to read the results to her over the phone however the pt daughter is not on her hipaa but [name] is. LVM for [name] to call the office for them results   Notes recorded by Octavio Manns on 02/22/2019 at 1:35 PM EDT  Patient called results given  ------   Notes recorded by Shelby Mattocks, PA-C on 02/22/2019 at 8:24 AM EDT  Please inform pt that her arthritis test is positive and I will send her to a rheumatologist.  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 For now is functioning.  I will discuss further and educate her more about this when she come in for her pap this month. ( will you make sure she has 30 min visit? I need to teach her a lot that day)

## 2019-04-22 NOTE — Telephone Encounter (Signed)
Linda Mercado has already spoken with Pt about her results. If her daughter has [place], [place] to read that message to her that is attached to those labs.

## 2019-04-28 ENCOUNTER — Other Ambulatory Visit: Payer: Self-pay

## 2019-04-28 ENCOUNTER — Ambulatory Visit (INDEPENDENT_AMBULATORY_CARE_PROVIDER_SITE_OTHER): Payer: Medicare Other | Admitting: Rheumatology

## 2019-04-28 ENCOUNTER — Encounter: Payer: Self-pay | Admitting: Rheumatology

## 2019-04-28 ENCOUNTER — Other Ambulatory Visit: Payer: Self-pay | Admitting: Internal Medicine

## 2019-04-28 ENCOUNTER — Telehealth: Payer: Medicare Other

## 2019-04-28 VITALS — BP 130/78 | HR 65 | Resp 14 | Ht 63.0 in | Wt 157.2 lb

## 2019-04-28 DIAGNOSIS — Z981 Arthrodesis status: Secondary | ICD-10-CM | POA: Diagnosis not present

## 2019-04-28 DIAGNOSIS — M2550 Pain in unspecified joint: Secondary | ICD-10-CM

## 2019-04-28 DIAGNOSIS — M19071 Primary osteoarthritis, right ankle and foot: Secondary | ICD-10-CM

## 2019-04-28 DIAGNOSIS — M2241 Chondromalacia patellae, right knee: Secondary | ICD-10-CM

## 2019-04-28 DIAGNOSIS — E785 Hyperlipidemia, unspecified: Secondary | ICD-10-CM

## 2019-04-28 DIAGNOSIS — E119 Type 2 diabetes mellitus without complications: Secondary | ICD-10-CM

## 2019-04-28 DIAGNOSIS — M791 Myalgia, unspecified site: Secondary | ICD-10-CM

## 2019-04-28 DIAGNOSIS — M2242 Chondromalacia patellae, left knee: Secondary | ICD-10-CM

## 2019-04-28 DIAGNOSIS — E559 Vitamin D deficiency, unspecified: Secondary | ICD-10-CM

## 2019-04-28 DIAGNOSIS — M19072 Primary osteoarthritis, left ankle and foot: Secondary | ICD-10-CM

## 2019-04-28 DIAGNOSIS — R296 Repeated falls: Secondary | ICD-10-CM

## 2019-04-28 DIAGNOSIS — G894 Chronic pain syndrome: Secondary | ICD-10-CM

## 2019-04-28 DIAGNOSIS — R768 Other specified abnormal immunological findings in serum: Secondary | ICD-10-CM | POA: Diagnosis not present

## 2019-04-28 DIAGNOSIS — K219 Gastro-esophageal reflux disease without esophagitis: Secondary | ICD-10-CM

## 2019-04-28 DIAGNOSIS — F319 Bipolar disorder, unspecified: Secondary | ICD-10-CM

## 2019-04-28 DIAGNOSIS — I10 Essential (primary) hypertension: Secondary | ICD-10-CM

## 2019-04-28 NOTE — Patient Instructions (Signed)
Journal for Nurse Practitioners, 15(4), 263-267. Retrieved April 27, 2018 from http://clinicalkey.com/nursing">  Knee Exercises Ask your health care provider which exercises are safe for you. Do exercises exactly as told by your health care provider and adjust them as directed. It is normal to feel mild stretching, pulling, tightness, or discomfort as you do these exercises. Stop right away if you feel sudden pain or your pain gets worse. Do not begin these exercises until told by your health care provider. Stretching and range-of-motion exercises These exercises warm up your muscles and joints and improve the movement and flexibility of your knee. These exercises also help to relieve pain and swelling. Knee extension, prone 1. Lie on your abdomen (prone position) on a bed. 2. Place your left / right knee just beyond the edge of the surface so your knee is not on the bed. You can put a towel under your left / right thigh just above your kneecap for comfort. 3. Relax your leg muscles and allow gravity to straighten your knee (extension). You should feel a stretch behind your left / right knee. 4. Hold this position for __________ seconds. 5. Scoot up so your knee is supported between repetitions. Repeat __________ times. Complete this exercise __________ times a day. Knee flexion, active  1. Lie on your back with both legs straight. If this causes back discomfort, bend your left / right knee so your foot is flat on the floor. 2. Slowly slide your left / right heel back toward your buttocks. Stop when you feel a gentle stretch in the front of your knee or thigh (flexion). 3. Hold this position for __________ seconds. 4. Slowly slide your left / right heel back to the starting position. Repeat __________ times. Complete this exercise __________ times a day. Quadriceps stretch, prone  1. Lie on your abdomen on a firm surface, such as a bed or padded floor. 2. Bend your left / right knee and hold  your ankle. If you cannot reach your ankle or pant leg, loop a belt around your foot and grab the belt instead. 3. Gently pull your heel toward your buttocks. Your knee should not slide out to the side. You should feel a stretch in the front of your thigh and knee (quadriceps). 4. Hold this position for __________ seconds. Repeat __________ times. Complete this exercise __________ times a day. Hamstring, supine 1. Lie on your back (supine position). 2. Loop a belt or towel over the ball of your left / right foot. The ball of your foot is on the walking surface, right under your toes. 3. Straighten your left / right knee and slowly pull on the belt to raise your leg until you feel a gentle stretch behind your knee (hamstring). ? Do not let your knee bend while you do this. ? Keep your other leg flat on the floor. 4. Hold this position for __________ seconds. Repeat __________ times. Complete this exercise __________ times a day. Strengthening exercises These exercises build strength and endurance in your knee. Endurance is the ability to use your muscles for a long time, even after they get tired. Quadriceps, isometric This exercise stretches the muscles in front of your thigh (quadriceps) without moving your knee joint (isometric). 1. Lie on your back with your left / right leg extended and your other knee bent. Put a rolled towel or small pillow under your knee if told by your health care provider. 2. Slowly tense the muscles in the front of your left /   right thigh. You should see your kneecap slide up toward your hip or see increased dimpling just above the knee. This motion will push the back of the knee toward the floor. 3. For __________ seconds, hold the muscle as tight as you can without increasing your pain. 4. Relax the muscles slowly and completely. Repeat __________ times. Complete this exercise __________ times a day. Straight leg raises This exercise stretches the muscles in front  of your thigh (quadriceps) and the muscles that move your hips (hip flexors). 1. Lie on your back with your left / right leg extended and your other knee bent. 2. Tense the muscles in the front of your left / right thigh. You should see your kneecap slide up or see increased dimpling just above the knee. Your thigh may even shake a bit. 3. Keep these muscles tight as you raise your leg 4-6 inches (10-15 cm) off the floor. Do not let your knee bend. 4. Hold this position for __________ seconds. 5. Keep these muscles tense as you lower your leg. 6. Relax your muscles slowly and completely after each repetition. Repeat __________ times. Complete this exercise __________ times a day. Hamstring, isometric 1. Lie on your back on a firm surface. 2. Bend your left / right knee about __________ degrees. 3. Dig your left / right heel into the surface as if you are trying to pull it toward your buttocks. Tighten the muscles in the back of your thighs (hamstring) to "dig" as hard as you can without increasing any pain. 4. Hold this position for __________ seconds. 5. Release the tension gradually and allow your muscles to relax completely for __________ seconds after each repetition. Repeat __________ times. Complete this exercise __________ times a day. Hamstring curls If told by your health care provider, do this exercise while wearing ankle weights. Begin with __________ lb weights. Then increase the weight by 1 lb (0.5 kg) increments. Do not wear ankle weights that are more than __________ lb. 1. Lie on your abdomen with your legs straight. 2. Bend your left / right knee as far as you can without feeling pain. Keep your hips flat against the floor. 3. Hold this position for __________ seconds. 4. Slowly lower your leg to the starting position. Repeat __________ times. Complete this exercise __________ times a day. Squats This exercise strengthens the muscles in front of your thigh and knee  (quadriceps). 1. Stand in front of a table, with your feet and knees pointing straight ahead. You may rest your hands on the table for balance but not for support. 2. Slowly bend your knees and lower your hips like you are going to sit in a chair. ? Keep your weight over your heels, not over your toes. ? Keep your lower legs upright so they are parallel with the table legs. ? Do not let your hips go lower than your knees. ? Do not bend lower than told by your health care provider. ? If your knee pain increases, do not bend as low. 3. Hold the squat position for __________ seconds. 4. Slowly push with your legs to return to standing. Do not use your hands to pull yourself to standing. Repeat __________ times. Complete this exercise __________ times a day. Wall slides This exercise strengthens the muscles in front of your thigh and knee (quadriceps). 1. Lean your back against a smooth wall or door, and walk your feet out 18-24 inches (46-61 cm) from it. 2. Place your feet hip-width apart. 3.   Slowly slide down the wall or door until your knees bend __________ degrees. Keep your knees over your heels, not over your toes. Keep your knees in line with your hips. 4. Hold this position for __________ seconds. Repeat __________ times. Complete this exercise __________ times a day. Straight leg raises This exercise strengthens the muscles that rotate the leg at the hip and move it away from your body (hip abductors). 1. Lie on your side with your left / right leg in the top position. Lie so your head, shoulder, knee, and hip line up. You may bend your bottom knee to help you keep your balance. 2. Roll your hips slightly forward so your hips are stacked directly over each other and your left / right knee is facing forward. 3. Leading with your heel, lift your top leg 4-6 inches (10-15 cm). You should feel the muscles in your outer hip lifting. ? Do not let your foot drift forward. ? Do not let your knee  roll toward the ceiling. 4. Hold this position for __________ seconds. 5. Slowly return your leg to the starting position. 6. Let your muscles relax completely after each repetition. Repeat __________ times. Complete this exercise __________ times a day. Straight leg raises This exercise stretches the muscles that move your hips away from the front of the pelvis (hip extensors). 1. Lie on your abdomen on a firm surface. You can put a pillow under your hips if that is more comfortable. 2. Tense the muscles in your buttocks and lift your left / right leg about 4-6 inches (10-15 cm). Keep your knee straight as you lift your leg. 3. Hold this position for __________ seconds. 4. Slowly lower your leg to the starting position. 5. Let your leg relax completely after each repetition. Repeat __________ times. Complete this exercise __________ times a day. This information is not intended to replace advice given to you by your health care provider. Make sure you discuss any questions you have with your health care provider. Document Released: 05/22/2005 Document Revised: 04/28/2018 Document Reviewed: 04/28/2018 Elsevier Patient Education  2020 [employer]  

## 2019-04-29 ENCOUNTER — Ambulatory Visit (INDEPENDENT_AMBULATORY_CARE_PROVIDER_SITE_OTHER): Payer: Medicare Other | Admitting: Internal Medicine

## 2019-04-29 ENCOUNTER — Other Ambulatory Visit: Payer: Self-pay

## 2019-04-29 ENCOUNTER — Other Ambulatory Visit (HOSPITAL_COMMUNITY)
Admission: RE | Admit: 2019-04-29 | Discharge: 2019-04-29 | Disposition: A | Discharge status: Home / Self Care | Payer: Medicare Other | Source: Ambulatory Visit | Attending: Internal Medicine | Admitting: Internal Medicine

## 2019-04-29 VITALS — BP 122/80 | HR 110 | Temp 98.3°F | Ht 63.8 in | Wt 155.2 lb

## 2019-04-29 DIAGNOSIS — R635 Abnormal weight gain: Secondary | ICD-10-CM

## 2019-04-29 DIAGNOSIS — G8929 Other chronic pain: Secondary | ICD-10-CM

## 2019-04-29 DIAGNOSIS — M25562 Pain in left knee: Secondary | ICD-10-CM

## 2019-04-29 DIAGNOSIS — Z1231 Encounter for screening mammogram for malignant neoplasm of breast: Secondary | ICD-10-CM

## 2019-04-29 DIAGNOSIS — E063 Autoimmune thyroiditis: Secondary | ICD-10-CM

## 2019-04-29 DIAGNOSIS — K5901 Slow transit constipation: Secondary | ICD-10-CM

## 2019-04-29 DIAGNOSIS — Z01419 Encounter for gynecological examination (general) (routine) without abnormal findings: Secondary | ICD-10-CM | POA: Diagnosis not present

## 2019-04-29 DIAGNOSIS — Z124 Encounter for screening for malignant neoplasm of cervix: Secondary | ICD-10-CM | POA: Diagnosis not present

## 2019-04-29 DIAGNOSIS — M25561 Pain in right knee: Secondary | ICD-10-CM | POA: Diagnosis not present

## 2019-04-29 DIAGNOSIS — E119 Type 2 diabetes mellitus without complications: Secondary | ICD-10-CM | POA: Diagnosis not present

## 2019-04-29 DIAGNOSIS — L819 Disorder of pigmentation, unspecified: Secondary | ICD-10-CM

## 2019-04-29 NOTE — Progress Notes (Signed)
Subjective:     Patient ID: Linda Mercado , female    DOB: 07-02-1966 , 53 y.o.   MRN: [id number]   Chief Complaint  Patient presents with  . Annual Exam    breast exam and pap, also would like to see ortho for knee    HPI  1-Pt is here for breast xm and pap which she skipped when she had her physical this year.  2- would like referral for ortho for her  Bilateral Knee pains which has been going on since 1987. Five years ago, so an orthopedist who recommended surgery, but she declined. She feels her L knee is getting worse,  And several times she gets sharp pains with certain positions specially pivoting and her leg gives away. Pain radiates to her posterior knee. Pain is worse with moving it and walking. Rest eases it a little.     Past Medical History:  Diagnosis Date  . Acid reflux   . Bradycardia   . Chronic back pain   . Diabetes mellitus without complication ([place])   . Hypertension   . Hypokalemia      Family History  Problem Relation Age of Onset  . Diabetes Father   . Hypertension Father   . Kidney disease Mother   . Hypertension Mother   . Bipolar disorder Sister   . Schizophrenia Sister   . Neuropathy Sister   . Hypertension Sister   . Bipolar disorder Brother   . Bipolar disorder Brother   . Schizophrenia Brother   . Bipolar disorder Daughter   . Post-traumatic stress disorder Daughter   . Asthma Daughter   . Depression Daughter   . Bipolar disorder Daughter   . Post-traumatic stress disorder Daughter   . Asthma Daughter   . Depression Daughter   . Asthma Daughter   . Depression Daughter   . Asthma Daughter   . Stroke Neg Hx   . Cancer Neg Hx   . CAD Neg Hx      Current Outpatient Medications:  .  albuterol (VENTOLIN HFA) 108 (90 Base) MCG/ACT inhaler, Inhale 2 puffs into the lungs every 6 (six) hours as needed for wheezing or shortness of breath., Disp: 3 g, Rfl: 1 .  AMITIZA 24 MCG capsule, 24 mcg 2 (two) times daily., Disp: , Rfl:  .  atenolol  (TENORMIN) 100 MG tablet, Take 1 tablet by mouth daily., Disp: 30 tablet, Rfl: 0 .  atorvastatin (LIPITOR) 20 MG tablet, Take 1 tablet by mouth at bedtime., Disp: 90 tablet, Rfl: 0 .  calcium gluconate 500 MG tablet, Take one Monday through Friday only., Disp: 90 tablet, Rfl: 0 .  CAPLYTA 42 MG CAPS, TK 1 C PO IN THE EVE WF, Disp: , Rfl:  .  CAPTOPRIL PO, Take 1 tablet by mouth at bedtime., Disp: , Rfl:  .  Cyanocobalamin (B-12) 500 MCG SUBL, One SL QD, Disp: 90 tablet, Rfl: 1 .  cyclobenzaprine (FLEXERIL) 5 MG tablet, 1-2 tid prn muscle spasm, Disp: 30 tablet, Rfl: 0 .  DEXILANT 60 MG capsule, Take 1 capsule (60 mg total) by mouth daily., Disp: 90 capsule, Rfl: 0 .  diclofenac sodium (VOLTAREN) 1 % GEL, Apply 4 grams topically four times daily., Disp: 400 g, Rfl: 0 .  divalproex (DEPAKOTE ER) 500 MG 24 hr tablet, Take 1,000 mg by mouth daily. , Disp: , Rfl: 1 .  docusate sodium (COLACE) 50 MG capsule, Take 50 mg by mouth at bedtime., Disp: , Rfl:  .  fluticasone (FLOVENT HFA) 110 MCG/ACT inhaler, Inhale 2 puffs into the lungs every 12 (twelve) hours., Disp: , Rfl:  .  FREESTYLE LITE test strip, CHECK BLOOD SUGAR 3 TO 4 TIMES DAILY, Disp: 150 each, Rfl: 11 .  hydrochlorothiazide (HYDRODIURIL) 12.5 MG tablet, Take 1 tablet by mouth once daily., Disp: 90 tablet, Rfl: 0 .  HYDROmorphone (DILAUDID) 4 MG tablet, Take 4 mg by mouth 4 (four) times daily as needed for severe pain., Disp: , Rfl:  .  hydrOXYzine (ATARAX/VISTARIL) 25 MG tablet, Take 25 mg by mouth 2 (two) times daily. , Disp: , Rfl:  .  lidocaine (XYLOCAINE) 2 % solution, , Disp: , Rfl:  .  losartan (COZAAR) 25 MG tablet, Take 1 tablet (25 mg total) by mouth daily., Disp: 90 tablet, Rfl: 0 .  metFORMIN (GLUCOPHAGE) 500 MG tablet, Take 1 tablet (500 mg total) by mouth 2 (two) times daily with a meal., Disp: 180 tablet, Rfl: 1 .  NARCAN 4 MG/0.1ML LIQD nasal spray kit, Place 1 spray into the nose as directed., Disp: , Rfl: 0 .   Norgestimate-Ethinyl Estradiol Triphasic (TRI-ESTARYLLA) 0.18/0.215/0.25 MG-35 MCG tablet, Take 1 tablet by mouth daily., Disp: 1 Package, Rfl: 0 .  PAIN MANAGEMENT INTRATHECAL, IT, PUMP, 1 each by Intrathecal route. Intrathecal (IT) medication: fentanyl, clonidine,, Disp: , Rfl:  .  polyethylene glycol powder (GLYCOLAX/MIRALAX) 17 GM/SCOOP powder, Mix 17 grams (1 capful) with 8 ounces of fluid and take by mouth daily for bowels., Disp: 255 g, Rfl: 0 .  promethazine (PHENERGAN) 25 MG tablet, Take 1 tablet (25 mg total) by mouth every 6 (six) hours as needed for nausea or vomiting., Disp: 30 tablet, Rfl: 2 .  QUEtiapine (SEROQUEL XR) 400 MG 24 hr tablet, Take 400 mg by mouth 2 (two) times daily., Disp: , Rfl:  .  traZODone (DESYREL) 100 MG tablet, 5 qhs prescribed by psychiatry, Disp: 30 tablet, Rfl: 0 .  triamcinolone cream (KENALOG) 0.1 %, Apply 1 application topically daily as needed (for itching)., Disp: 30 g, Rfl: 2 .  Vitamin D, Ergocalciferol, (DRISDOL) 1.25 MG (50000 UT) CAPS capsule, One twice a week with a fatty meal, Disp: 24 capsule, Rfl: 0 .  vortioxetine HBr (TRINTELLIX) 10 MG TABS tablet, Take 10 mg by mouth at bedtime., Disp: , Rfl:  .  VRAYLAR 6 MG CAPS, Take 1 capsule by mouth daily., Disp: , Rfl:  .  clonazePAM (KLONOPIN) 1 MG tablet, Take 1 mg by mouth 3 (three) times daily., Disp: , Rfl:    Allergies  Allergen Reactions  . Lisinopril Hives    Other reaction(s): anaphylaxis/angioedema  . Latex Hives  . Adhesive [Tape] Rash  . Sulfamethoxazole-Trimethoprim Diarrhea    Other reaction(s): gi distress     Review of Systems  Denies abnormal vaginal discharge, urinary difficulties. Positive for myalgias, alrthalgias, back pain, fatigue, trouble swallowing and has had 2 esophageal dilatations done, but still feels capsules get stuck in her throat, wt gain. Has seen endocrinologist and there was no recommendations for her to do. She feels very frustrated and tired about her  myalgias. She has also seen rheumatology.   She is terribly constipated and has been taking stool softners but they are not helping.  She has hx of pigmentation on her face and has been using OTC topical cream for years which helps.  Objective:  Physical Exam Vitals signs and nursing note reviewed.  Constitutional:      General: She is not in acute distress.  Appearance: She is not toxic-appearing.     Comments: Uses a cane to ambulate  HENT:     Right Ear: External ear normal.     Left Ear: External ear normal.     Nose: Nose normal.  Eyes:     General: No scleral icterus.    Conjunctiva/sclera: Conjunctivae normal.  Neck:     Musculoskeletal: Neck supple.     Comments: No thyroidmegally noted Cardiovascular:     Pulses: Normal pulses.  Pulmonary:     Effort: Pulmonary effort is normal.  Abdominal:     General: Bowel sounds are normal. There is no distension.     Palpations: Abdomen is soft. There is no mass.     Tenderness: There is no abdominal tenderness.  Musculoskeletal:        General: No swelling, deformity or signs of injury.     Right lower leg: No edema.     Left lower leg: No edema.     Comments: KNEES- Both have normal ROM, but the L shows tenderness with full flexion and extension and tenderness on the medial knee joint region. No effusion noted on either one.   Skin:    General: Skin is warm and dry.     Capillary Refill: Capillary refill takes less than 2 seconds.  Neurological:     Mental Status: She is alert and oriented to person, place, and time.     Deep Tendon Reflexes: Reflexes normal.  Psychiatric:        Behavior: Behavior normal.        Thought Content: Thought content normal.        Judgment: Judgment normal.     Comments: Affect is stressed     Assessment And Plan:  1. Screening for cervical cancer- routine - Cytology -Pap Smear  2. Encounter for screening mammogram for malignant neoplasm of breast-screen - MM Digital Screening;  Future  3. Type 2 diabetes mellitus without complication, without long-term current use of insulin ([place])- stable and controlled.. May continue Metformin.  - Hemoglobin A1c - Lipid Profile  4. Bilateral chronic knee pain- sent to ortho - Ambulatory referral to Orthopedic Surgery  5. Slow transit constipation- chronic.      I will have her try a natural magnesium powder called CALM 1-2 tsp qhs FU next week to see if the CALM has helped her.  6. Weight gain- more likely due to Hashimoto's     Advised to get off inflammatory foods which I reviewed with her.  7. Skin pigmentation disorder- chronic.      May continue current treatment  8. Hashimoto's disease- chronic.     I discussed with her about a book called Hashimoto's protocol    From someone who has her same condition and follow her diet protocol.  - US THYROID; Future 9. Chronic pain- will continue care with her pain physician.    Phill Steck RODRIGUEZ-SOUTHWORTH, PA-C    THE PATIENT IS ENCOURAGED TO PRACTICE SOCIAL DISTANCING DUE TO THE COVID-19 PANDEMIC.

## 2019-04-30 ENCOUNTER — Ambulatory Visit: Payer: Self-pay

## 2019-04-30 DIAGNOSIS — E119 Type 2 diabetes mellitus without complications: Secondary | ICD-10-CM

## 2019-04-30 LAB — HEMOGLOBIN A1C
Est. average glucose Bld gHb Est-mCnc: 140 mg/dL
Hgb A1c MFr Bld: 6.5 % — ABNORMAL HIGH (ref 4.8–5.6)

## 2019-04-30 LAB — LIPID PANEL
Chol/HDL Ratio: 2.5 ratio (ref 0.0–4.4)
Cholesterol, Total: 111 mg/dL (ref 100–199)
HDL: 45 mg/dL (ref >39)
LDL Chol Calc (NIH): 48 mg/dL (ref 0–99)
Triglycerides: 94 mg/dL (ref 0–149)
VLDL Cholesterol Cal: 18 mg/dL (ref 5–40)

## 2019-04-30 NOTE — Chronic Care Management (AMB) (Signed)
  Chronic Care Management   Social Work Note  04/30/2019 Name: TONIQUA COZBY MRN: [id number] DOB: 02-21-1966  SW placed an unsuccessful outbound call to the patient to assist with care coordination. HIPAA compliant voice message was left requesting a return call.   Follow Up Plan: SW will follow up with patient by phone over the next two weeks.  Daneen Schick, BSW, CDP Social Worker, Certified Dementia Practitioner [place] [facility] [phone]

## 2019-05-05 LAB — CYTOLOGY - PAP: Diagnosis: NEGATIVE

## 2019-05-06 ENCOUNTER — Other Ambulatory Visit: Payer: Self-pay

## 2019-05-06 ENCOUNTER — Ambulatory Visit (INDEPENDENT_AMBULATORY_CARE_PROVIDER_SITE_OTHER): Payer: Medicare Other | Admitting: Internal Medicine

## 2019-05-06 ENCOUNTER — Encounter: Payer: Self-pay | Admitting: Internal Medicine

## 2019-05-06 ENCOUNTER — Ambulatory Visit (INDEPENDENT_AMBULATORY_CARE_PROVIDER_SITE_OTHER): Payer: Medicare Other | Admitting: Orthopaedic Surgery

## 2019-05-06 ENCOUNTER — Encounter: Payer: Self-pay | Admitting: Orthopaedic Surgery

## 2019-05-06 VITALS — BP 126/72 | HR 71 | Temp 98.1°F | Ht 63.8 in | Wt 154.0 lb

## 2019-05-06 DIAGNOSIS — E063 Autoimmune thyroiditis: Secondary | ICD-10-CM | POA: Diagnosis not present

## 2019-05-06 DIAGNOSIS — E119 Type 2 diabetes mellitus without complications: Secondary | ICD-10-CM

## 2019-05-06 DIAGNOSIS — Z978 Presence of other specified devices: Secondary | ICD-10-CM

## 2019-05-06 DIAGNOSIS — K5901 Slow transit constipation: Secondary | ICD-10-CM | POA: Diagnosis not present

## 2019-05-06 DIAGNOSIS — M25561 Pain in right knee: Secondary | ICD-10-CM | POA: Diagnosis not present

## 2019-05-06 DIAGNOSIS — G894 Chronic pain syndrome: Secondary | ICD-10-CM

## 2019-05-06 DIAGNOSIS — G8929 Other chronic pain: Secondary | ICD-10-CM

## 2019-05-06 DIAGNOSIS — M25562 Pain in left knee: Secondary | ICD-10-CM | POA: Diagnosis not present

## 2019-05-06 MED ORDER — ATORVASTATIN CALCIUM 10 MG PO TABS: 10.0000 mg | ORAL_TABLET | Freq: Every day | ORAL | 0 refills | Status: DC

## 2019-05-06 NOTE — Addendum Note (Signed)
Addended by: Lendon Collar on: 05/06/2019 02:07 PM   Modules accepted: Orders
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
## 2019-05-06 NOTE — Progress Notes (Signed)
Subjective:     Patient ID: Linda Mercado , female    DOB: 06-Aug-1965 , 53 y.o.   MRN: [id number]   Chief Complaint  Patient presents with  . Follow-up    HPI  Here for Fu constipation, labs and Hashimoto thyroiditis. She started avoiding gluten. Has not received the CALM yet, so been continuing to take the Miralax and just having small BM's. She also has not started reading the book I recommended her to do the [place]. She has not had her thyroid ultrasound yet.   Past Medical History:  Diagnosis Date  . Acid reflux   . Bradycardia   . Chronic back pain   . Diabetes mellitus without complication ([place])   . Hypertension   . Hypokalemia      Family History  Problem Relation Age of Onset  . Diabetes Father   . Hypertension Father   . Kidney disease Mother   . Hypertension Mother   . Bipolar disorder Sister   . Schizophrenia Sister   . Neuropathy Sister   . Hypertension Sister   . Bipolar disorder Brother   . Bipolar disorder Brother   . Schizophrenia Brother   . Bipolar disorder Daughter   . Post-traumatic stress disorder Daughter   . Asthma Daughter   . Depression Daughter   . Bipolar disorder Daughter   . Post-traumatic stress disorder Daughter   . Asthma Daughter   . Depression Daughter   . Asthma Daughter   . Depression Daughter   . Asthma Daughter   . Stroke Neg Hx   . Cancer Neg Hx   . CAD Neg Hx      Current Outpatient Medications:  .  albuterol (VENTOLIN HFA) 108 (90 Base) MCG/ACT inhaler, Inhale 2 puffs into the lungs every 6 (six) hours as needed for wheezing or shortness of breath., Disp: 3 g, Rfl: 1 .  AMITIZA 24 MCG capsule, 24 mcg 2 (two) times daily., Disp: , Rfl:  .  atenolol (TENORMIN) 100 MG tablet, Take 1 tablet by mouth daily., Disp: 30 tablet, Rfl: 0 .  atorvastatin (LIPITOR) 20 MG tablet, Take 1 tablet by mouth at bedtime., Disp: 90 tablet, Rfl: 0 .  calcium gluconate 500 MG tablet, Take one Monday through Friday only.,  Disp: 90 tablet, Rfl: 0 .  CAPLYTA 42 MG CAPS, TK 1 C PO IN THE EVE WF, Disp: , Rfl:  .  CAPTOPRIL PO, Take 1 tablet by mouth at bedtime., Disp: , Rfl:  .  Cyanocobalamin (B-12) 500 MCG SUBL, One SL QD, Disp: 90 tablet, Rfl: 1 .  cyclobenzaprine (FLEXERIL) 5 MG tablet, 1-2 tid prn muscle spasm, Disp: 30 tablet, Rfl: 0 .  DEXILANT 60 MG capsule, Take 1 capsule (60 mg total) by mouth daily., Disp: 90 capsule, Rfl: 0 .  diclofenac sodium (VOLTAREN) 1 % GEL, Apply 4 grams topically four times daily., Disp: 400 g, Rfl: 0 .  divalproex (DEPAKOTE ER) 500 MG 24 hr tablet, Take 1,000 mg by mouth daily. , Disp: , Rfl: 1 .  docusate sodium (COLACE) 50 MG capsule, Take 50 mg by mouth at bedtime., Disp: , Rfl:  .  fluticasone (FLOVENT HFA) 110 MCG/ACT inhaler, Inhale 2 puffs into the lungs every 12 (twelve) hours., Disp: , Rfl:  .  FREESTYLE LITE test strip, CHECK BLOOD SUGAR 3 TO 4 TIMES DAILY, Disp: 150 each, Rfl: 11 .  hydrochlorothiazide (HYDRODIURIL) 12.5 MG tablet, Take 1 tablet by mouth once daily., Disp: 90 tablet,  Rfl: 0 .  HYDROmorphone (DILAUDID) 4 MG tablet, Take 4 mg by mouth 4 (four) times daily as needed for severe pain., Disp: , Rfl:  .  hydrOXYzine (ATARAX/VISTARIL) 25 MG tablet, Take 25 mg by mouth 2 (two) times daily. , Disp: , Rfl:  .  lidocaine (XYLOCAINE) 2 % solution, , Disp: , Rfl:  .  LORazepam (ATIVAN) 1 MG tablet, TK 1 T PO TID, Disp: , Rfl:  .  losartan (COZAAR) 25 MG tablet, Take 1 tablet (25 mg total) by mouth daily., Disp: 90 tablet, Rfl: 0 .  metFORMIN (GLUCOPHAGE) 500 MG tablet, Take 1 tablet (500 mg total) by mouth 2 (two) times daily with a meal., Disp: 180 tablet, Rfl: 1 .  NARCAN 4 MG/0.1ML LIQD nasal spray kit, Place 1 spray into the nose as directed., Disp: , Rfl: 0 .  Norgestimate-Ethinyl Estradiol Triphasic (TRI-ESTARYLLA) 0.18/0.215/0.25 MG-35 MCG tablet, Take 1 tablet by mouth daily., Disp: 1 Package, Rfl: 0 .  PAIN MANAGEMENT INTRATHECAL, IT, PUMP, 1 each by  Intrathecal route. Intrathecal (IT) medication: fentanyl, clonidine,, Disp: , Rfl:  .  polyethylene glycol powder (GLYCOLAX/MIRALAX) 17 GM/SCOOP powder, Mix 17 grams (1 capful) with 8 ounces of fluid and take by mouth daily for bowels., Disp: 255 g, Rfl: 0 .  promethazine (PHENERGAN) 25 MG tablet, Take 1 tablet (25 mg total) by mouth every 6 (six) hours as needed for nausea or vomiting., Disp: 30 tablet, Rfl: 2 .  QUEtiapine (SEROQUEL XR) 400 MG 24 hr tablet, Take 400 mg by mouth 2 (two) times daily., Disp: , Rfl:  .  traZODone (DESYREL) 100 MG tablet, 5 qhs prescribed by psychiatry, Disp: 30 tablet, Rfl: 0 .  triamcinolone cream (KENALOG) 0.1 %, Apply 1 application topically daily as needed (for itching)., Disp: 30 g, Rfl: 2 .  Vitamin D, Ergocalciferol, (DRISDOL) 1.25 MG (50000 UT) CAPS capsule, One twice a week with a fatty meal, Disp: 24 capsule, Rfl: 0 .  vortioxetine HBr (TRINTELLIX) 10 MG TABS tablet, Take 10 mg by mouth at bedtime., Disp: , Rfl:  .  VRAYLAR 6 MG CAPS, Take 1 capsule by mouth daily., Disp: , Rfl:  .  clonazePAM (KLONOPIN) 1 MG tablet, Take 1 mg by mouth 3 (three) times daily., Disp: , Rfl:    Allergies  Allergen Reactions  . Lisinopril Hives    Other reaction(s): anaphylaxis/angioedema  . Latex Hives  . Adhesive [Tape] Rash  . Sulfamethoxazole-Trimethoprim Diarrhea    Other reaction(s): gi distress     Review of Systems  Constitutional: Positive for fatigue. Negative for activity change and appetite change.  HENT: Negative for congestion, postnasal drip and rhinorrhea.   Respiratory: Negative for cough.   Cardiovascular: Negative for chest pain and leg swelling.  Gastrointestinal: Positive for abdominal distention and constipation. Negative for abdominal pain, nausea and vomiting.  Endocrine: Positive for cold intolerance. Negative for heat intolerance, polydipsia, polyphagia and polyuria.  Musculoskeletal: Positive for arthralgias, back pain, gait problem,  myalgias and neck pain.  Skin: Negative for rash and wound.  Allergic/Immunologic: Positive for environmental allergies.  Neurological: Positive for numbness. Negative for headaches.  Hematological: Negative for adenopathy. Does not bruise/bleed easily.  Psychiatric/Behavioral: Positive for sleep disturbance. Negative for agitation and confusion. The patient is nervous/anxious. Hyperactive:      + constipation, bloating Today's Vitals   05/06/19 1635  BP: 126/72  Pulse: 71  Temp: 98.1 F (36.7 C)  TempSrc: Oral  SpO2: 92%  Weight: 154 lb (69.9 kg)  Height:  5' 3.8" (1.621 m)   Body mass index is 26.6 kg/m.   Objective:  Physical Exam Vitals signs and nursing note reviewed.  Constitutional:      General: She is not in acute distress.    Appearance: She is not toxic-appearing.     Comments: Walks slow and uses a caine to walk  HENT:     Head: Atraumatic.     Right Ear: External ear normal.     Left Ear: External ear normal.     Nose: Nose normal.  Eyes:     General: No scleral icterus.    Conjunctiva/sclera: Conjunctivae normal.  Neck:     Musculoskeletal: Neck supple.  Cardiovascular:     Rate and Rhythm: Normal rate and regular rhythm.     Heart sounds: No murmur.  Pulmonary:     Effort: Pulmonary effort is normal.     Breath sounds: Normal breath sounds.  Abdominal:     General: Bowel sounds are normal.     Palpations: Abdomen is soft.     Tenderness: There is no guarding or rebound.     Comments: Pain pump palpated on RLQ. Has mild to moderate tenderness on LLQ and L mid quadrant  Skin:    General: Skin is warm and dry.     Findings: No rash.  Neurological:     Mental Status: She is alert and oriented to person, place, and time.     Gait: Gait abnormal.  Psychiatric:        Mood and Affect: Mood normal.        Behavior: Behavior normal.        Thought Content: Thought content normal.        Judgment: Judgment normal.     Assessment And Plan:    1.  Hashimoto's disease- chronic. Will come back in 2 months which gives her enough time to finish reading the book I recommended her to read and follow the diet.   2. Type 2 diabetes mellitus without complication, without long-term current use of insulin ([place])- HGBA1C 6.5.   May continue the Metformin as prescribed.   3. Chronic pain syndrome- unchanged.      Will continue Fu with pain Dr.   4. Presence of intrathecal pump- chronic.    No action 5. Slow transit constipation- unchanged.     She will received the natural magnesium called CALM which she will receive tomorrow.  6. Hyperlipidemia- LDL a little too low at 48. I will have her reduce the lipitor to 10 mg qd. Recheck lipids in 3 months.   Fu 2 months.     Damaria Vachon RODRIGUEZ-SOUTHWORTH, PA-C    THE PATIENT IS ENCOURAGED TO PRACTICE SOCIAL DISTANCING DUE TO THE COVID-19 PANDEMIC.

## 2019-05-12 ENCOUNTER — Telehealth: Payer: Medicare Other

## 2019-05-12 ENCOUNTER — Ambulatory Visit: Payer: Self-pay

## 2019-05-12 DIAGNOSIS — E119 Type 2 diabetes mellitus without complications: Secondary | ICD-10-CM

## 2019-05-12 DIAGNOSIS — E063 Autoimmune thyroiditis: Secondary | ICD-10-CM

## 2019-05-12 NOTE — Chronic Care Management (AMB) (Signed)
  Chronic Care Management   Social Work Note  05/12/2019 Name: Linda Mercado MRN: [id number] DOB: 1966/01/23  Second unsuccessful outbound call placed to the patient to assist with care coordination and assess progression of patient stated goals. HIPAA compliant voice message left requesting a return call.  Follow Up Plan: SW will follow up with patient by phone over the next 2-3 weeks.  Daneen Schick, BSW, CDP Social Worker, Certified Dementia Practitioner [facility] [phone]

## 2019-05-27 ENCOUNTER — Ambulatory Visit (INDEPENDENT_AMBULATORY_CARE_PROVIDER_SITE_OTHER): Payer: Medicare Other

## 2019-05-27 DIAGNOSIS — E119 Type 2 diabetes mellitus without complications: Secondary | ICD-10-CM

## 2019-05-27 DIAGNOSIS — E063 Autoimmune thyroiditis: Secondary | ICD-10-CM

## 2019-05-27 NOTE — Chronic Care Management (AMB) (Signed)
Chronic Care Management     Social Work Follow Up Note  05/27/2019 Name: Linda Mercado MRN: [id number] DOB: 27-Feb-1966  SHALIN LINDERS is a 53 y.o. year old female who is a primary care patient of [facility], [name], [place]. The CCM team was consulted for assistance with care coordination.   Review of patient status, including review of consultants reports, other relevant assessments, and collaboration with appropriate care team members and the patient's provider was performed as part of comprehensive patient evaluation and provision of chronic care management services.    SW placed an outbound call to the patient to review advance directive packet previously mailed by RN Case Freight forwarder. During the call patient reported being painful. SW collaborated with RN Case Freight forwarder whom plans to outreach the patient over the next 5 days to determine adherence to provider recommendations.  Outpatient Encounter Medications as of 05/27/2019  Medication Sig Note  . albuterol (VENTOLIN HFA) 108 (90 Base) MCG/ACT inhaler Inhale 2 puffs into the lungs every 6 (six) hours as needed for wheezing or shortness of breath.   . AMITIZA 24 MCG capsule 24 mcg 2 (two) times daily.   [name] atenolol (TENORMIN) 100 MG tablet Take 1 tablet by mouth daily.   [name] atorvastatin (LIPITOR) 10 MG tablet Take 1 tablet (10 mg total) by mouth daily.   . calcium gluconate 500 MG tablet Take one Monday through Friday only.   . CAPLYTA 42 MG CAPS TK 1 C PO IN THE EVE WF   . CAPTOPRIL PO Take 1 tablet by mouth at bedtime.   . clonazePAM (KLONOPIN) 1 MG tablet Take 1 mg by mouth 3 (three) times daily.   . Cyanocobalamin (B-12) 500 MCG SUBL One SL QD   . cyclobenzaprine (FLEXERIL) 5 MG tablet 1-2 tid prn muscle spasm   . DEXILANT 60 MG capsule Take 1 capsule (60 mg total) by mouth daily.   . diclofenac sodium (VOLTAREN) 1 % GEL Apply 4 grams topically four times daily.   . divalproex (DEPAKOTE ER) 500 MG 24 hr tablet Take 1,000 mg by  mouth daily.  03/18/2018: Strength and sig verified by daughter, [facility], and [facility]  . docusate sodium (COLACE) 50 MG capsule Take 50 mg by mouth at bedtime.   . fluticasone (FLOVENT HFA) 110 MCG/ACT inhaler Inhale 2 puffs into the lungs every 12 (twelve) hours.   [name] FREESTYLE LITE test strip CHECK BLOOD SUGAR 3 TO 4 TIMES DAILY   . hydrochlorothiazide (HYDRODIURIL) 12.5 MG tablet Take 1 tablet by mouth once daily.   [name] HYDROmorphone (DILAUDID) 4 MG tablet Take 4 mg by mouth 4 (four) times daily as needed for severe pain.   . hydrOXYzine (ATARAX/VISTARIL) 25 MG tablet Take 25 mg by mouth 2 (two) times daily.    [name] lidocaine (XYLOCAINE) 2 % solution    . LORazepam (ATIVAN) 1 MG tablet TK 1 T PO TID   . losartan (COZAAR) 25 MG tablet Take 1 tablet (25 mg total) by mouth daily.   . metFORMIN (GLUCOPHAGE) 500 MG tablet Take 1 tablet (500 mg total) by mouth 2 (two) times daily with a meal.   . NARCAN 4 MG/0.1ML LIQD nasal spray kit Place 1 spray into the nose as directed.   . Norgestimate-Ethinyl Estradiol Triphasic (TRI-ESTARYLLA) 0.18/0.215/0.25 MG-35 MCG tablet Take 1 tablet by mouth daily.   [name] PAIN MANAGEMENT INTRATHECAL, IT, PUMP 1 each by Intrathecal route. Intrathecal (IT) medication: fentanyl, clonidine,   . polyethylene glycol powder (GLYCOLAX/MIRALAX) 17 GM/SCOOP powder  Mix 17 grams (1 capful) with 8 ounces of fluid and take by mouth daily for bowels.   . promethazine (PHENERGAN) 25 MG tablet Take 1 tablet (25 mg total) by mouth every 6 (six) hours as needed for nausea or vomiting.   [name] QUEtiapine (SEROQUEL XR) 400 MG 24 hr tablet Take 400 mg by mouth 2 (two) times daily.   . traZODone (DESYREL) 100 MG tablet 5 qhs prescribed by psychiatry   . triamcinolone cream (KENALOG) 0.1 % Apply 1 application topically daily as needed (for itching).   . Vitamin D, Ergocalciferol, (DRISDOL) 1.25 MG (50000 UT) CAPS capsule One twice a week with a fatty meal   . vortioxetine HBr (TRINTELLIX) 10 MG TABS  tablet Take 10 mg by mouth at bedtime.   [name] VRAYLAR 6 MG CAPS Take 1 capsule by mouth daily.    No facility-administered encounter medications on file as of 05/27/2019.      Goals Addressed            This Visit's Progress     Patient Stated   . "I may make my aunt my power of attorney" (pt-stated)   Not on track    Current Barriers:  [name] Knowledge Deficits related to how to complete Advanced directives  . No Advanced Directives in place  Nurse Case Manager Clinical Goal(s):  [name] Over the next 30 days, patient will verbalize understanding of plan for completion of Advanced Directives  CCM SW Interventions: Completed 05/27/2019 . Outbound call placed to the patient to assess progression of patient stated goal . Determined the patient has yet to receive mailing of advance directive packet . Performed chart review to determine RN CM mailed packet along with educational resources for Hashimoto's syndrome. . Advised the patient SW would communicate patients reports of resources not being received to allow RN Case Manager to send both resources in the same mailing . Collaboration with RN Case Manager [name] regarding above information  Patient Self Care Activities:  . Currently UNABLE TO independently perform self care  Please see past updates related to this goal by clicking on the "Past Updates" button in the selected goal          Follow Up Plan: No further SW follow up planned at this time. The patient will be outreached by RN Case Manager to discuss case management needs.   Daneen Schick, BSW, CDP Social Worker, Certified Dementia Practitioner [facility] [phone]  Total time spent performing care coordination and/or care management activities with the patient by phone or face to face = 23 minutes.

## 2019-05-27 NOTE — Patient Instructions (Signed)
Social Worker Visit Information  Goals we discussed today:  Goals Addressed            This Visit's Progress     Patient Stated   . "I may make my aunt my power of attorney" (pt-stated)   Not on track    Current Barriers:  [name] Knowledge Deficits related to how to complete Advanced directives  . No Advanced Directives in place  Nurse Case Manager Clinical Goal(s):  [name] Over the next 30 days, patient will verbalize understanding of plan for completion of Advanced Directives  CCM SW Interventions: Completed 05/27/2019 . Outbound call placed to the patient to assess progression of patient stated goal . Determined the patient has yet to receive mailing of advance directive packet . Performed chart review to determine RN CM mailed packet along with educational resources for Hashimoto's syndrome. . Advised the patient SW would communicate patients reports of resources not being received to allow RN Case Manager to send both resources in the same mailing . Collaboration with RN Case Manager [name] regarding above information  CCM RN CM Interventions:  04/07/19 call completed with patient  . Provided education to patient re: how to complete Advanced directive packet . Discussed plans with patient for ongoing care management follow up and provided patient with direct contact information for care management team . Provided patient with paper Advanced directive packet and instructed on completion  Patient Self Care Activities:  . Currently UNABLE TO independently perform self care  Please see past updates related to this goal by clicking on the "Past Updates" button in the selected goal          Follow Up Plan: Patient will be outreached by RN Case Manager for follow up of case management needs   Daneen Schick, BSW, CDP Social Worker, Certified Dementia Practitioner [place] [facility] [phone]

## 2019-05-28 ENCOUNTER — Ambulatory Visit: Payer: Self-pay

## 2019-05-28 DIAGNOSIS — E119 Type 2 diabetes mellitus without complications: Secondary | ICD-10-CM

## 2019-05-28 DIAGNOSIS — E063 Autoimmune thyroiditis: Secondary | ICD-10-CM

## 2019-05-28 DIAGNOSIS — G894 Chronic pain syndrome: Secondary | ICD-10-CM

## 2019-05-28 NOTE — Chronic Care Management (AMB) (Signed)
  Chronic Care Management   Outreach Note  05/28/2019 Name: [name] MRN: [id number] DOB: [birth year]  Referred by: Shelby Mattocks, PA-C Reason for referral : Chronic Care Management (CCM RNCM Telephone Follow up )   An unsuccessful telephone outreach was attempted today. The patient was referred to the case management team by Rodriguez-Southworth, Sandrea Matte for assistance with care management and care coordination.   Follow Up Plan: Telephone follow up appointment with care management team member scheduled for: 06/16/19   Barb Merino, RN, BSN, CCM Care Management Coordinator [facility]  Direct Phone: [phone]

## 2019-06-01 ENCOUNTER — Other Ambulatory Visit: Payer: Self-pay | Admitting: Internal Medicine

## 2019-06-01 DIAGNOSIS — K219 Gastro-esophageal reflux disease without esophagitis: Secondary | ICD-10-CM

## 2019-06-01 DIAGNOSIS — E559 Vitamin D deficiency, unspecified: Secondary | ICD-10-CM

## 2019-06-03 ENCOUNTER — Encounter: Payer: Self-pay | Admitting: Internal Medicine

## 2019-06-08 ENCOUNTER — Other Ambulatory Visit: Payer: Self-pay | Admitting: Internal Medicine

## 2019-06-09 ENCOUNTER — Other Ambulatory Visit: Payer: Self-pay | Admitting: Internal Medicine

## 2019-06-11 ENCOUNTER — Other Ambulatory Visit: Payer: Medicare Other

## 2019-06-16 ENCOUNTER — Telehealth: Payer: Medicare Other

## 2019-06-29 ENCOUNTER — Other Ambulatory Visit: Payer: Medicare Other

## 2019-07-05 ENCOUNTER — Other Ambulatory Visit: Payer: Self-pay | Admitting: Orthopaedic Surgery

## 2019-07-05 ENCOUNTER — Telehealth: Payer: Self-pay | Admitting: Orthopaedic Surgery

## 2019-07-05 DIAGNOSIS — M25562 Pain in left knee: Secondary | ICD-10-CM

## 2019-07-05 DIAGNOSIS — G8929 Other chronic pain: Secondary | ICD-10-CM

## 2019-07-05 NOTE — Telephone Encounter (Signed)
Please advise 

## 2019-07-05 NOTE — Telephone Encounter (Signed)
[place] with me if insurance certifies

## 2019-07-05 NOTE — Telephone Encounter (Signed)
Patient called. She is requesting that her right knee be included in her upcoming MRI. Requested a call back as well.  Call back number: [phone]

## 2019-07-05 NOTE — Telephone Encounter (Signed)
Spoke with patient. Order has been entered for right knee MRI.

## 2019-07-08 ENCOUNTER — Ambulatory Visit (INDEPENDENT_AMBULATORY_CARE_PROVIDER_SITE_OTHER): Payer: Medicare Other | Admitting: Internal Medicine

## 2019-07-08 ENCOUNTER — Other Ambulatory Visit: Payer: Self-pay

## 2019-07-08 ENCOUNTER — Other Ambulatory Visit (HOSPITAL_COMMUNITY)
Admission: RE | Admit: 2019-07-08 | Discharge: 2019-07-08 | Disposition: A | Discharge status: Home / Self Care | Payer: Medicare Other | Source: Ambulatory Visit | Attending: Internal Medicine | Admitting: Internal Medicine

## 2019-07-08 VITALS — BP 118/76 | HR 71 | Temp 98.3°F | Ht 63.8 in | Wt 159.4 lb

## 2019-07-08 DIAGNOSIS — N760 Acute vaginitis: Secondary | ICD-10-CM | POA: Insufficient documentation

## 2019-07-08 DIAGNOSIS — R110 Nausea: Secondary | ICD-10-CM

## 2019-07-08 DIAGNOSIS — G894 Chronic pain syndrome: Secondary | ICD-10-CM

## 2019-07-08 MED ORDER — NORGESTIM-ETH ESTRAD TRIPHASIC 0.18/0.215/0.25 MG-35 MCG PO TABS: 1.0000 | ORAL_TABLET | Freq: Every day | ORAL | 3 refills | Status: DC

## 2019-07-08 MED ORDER — PROMETHAZINE HCL 25 MG PO TABS: 25.0000 mg | ORAL_TABLET | Freq: Four times a day (QID) | ORAL | 0 refills | Status: DC | PRN

## 2019-07-08 NOTE — Progress Notes (Signed)
This visit occurred during the SARS-CoV-2 public health emergency.  Safety protocols were in place, including screening questions prior to the visit, additional usage of staff PPE, and extensive cleaning of exam room while observing appropriate contact time as indicated for disinfecting solutions.  Subjective:     Patient ID: Linda Mercado , female    DOB: 1965/09/24 , 53 y.o.   MRN: [id number]   Chief Complaint  Patient presents with  . Vaginitis    HPI  Has noticed vaginal disharge with odor x 1 week after using dove soap which she had not been using x 2y. Smell only lasted one day, and now she is fine, only minimal itching. Denies having a sexual partner.  2- Needs her birth control and phenergan pills refilled.   Past Medical History:  Diagnosis Date  . Acid reflux   . Bradycardia   . Chronic back pain   . Diabetes mellitus without complication ([place])   . Hypertension   . Hypokalemia      Family History  Problem Relation Age of Onset  . Diabetes Father   . Hypertension Father   . Kidney disease Mother   . Hypertension Mother   . Bipolar disorder Sister   . Schizophrenia Sister   . Neuropathy Sister   . Hypertension Sister   . Bipolar disorder Brother   . Bipolar disorder Brother   . Schizophrenia Brother   . Bipolar disorder Daughter   . Post-traumatic stress disorder Daughter   . Asthma Daughter   . Depression Daughter   . Bipolar disorder Daughter   . Post-traumatic stress disorder Daughter   . Asthma Daughter   . Depression Daughter   . Asthma Daughter   . Depression Daughter   . Asthma Daughter   . Stroke Neg Hx   . Cancer Neg Hx   . CAD Neg Hx      Current Outpatient Medications:  .  albuterol (VENTOLIN HFA) 108 (90 Base) MCG/ACT inhaler, Inhale 2 puffs into the lungs every 6 (six) hours as needed for wheezing or shortness of breath., Disp: 3 g, Rfl: 1 .  AMITIZA 24 MCG capsule, 24 mcg 2 (two) times daily., Disp: , Rfl:  .  atenolol (TENORMIN) 100  MG tablet, Take 1 tablet by mouth daily., Disp: 30 tablet, Rfl: 0 .  atorvastatin (LIPITOR) 10 MG tablet, Take 1 tablet (10 mg total) by mouth daily., Disp: 90 tablet, Rfl: 0 .  calcium gluconate 500 MG tablet, Take one Monday through Friday only., Disp: 90 tablet, Rfl: 0 .  CAPLYTA 42 MG CAPS, TK 1 C PO IN THE EVE WF, Disp: , Rfl:  .  CAPTOPRIL PO, Take 1 tablet by mouth at bedtime., Disp: , Rfl:  .  clonazePAM (KLONOPIN) 1 MG tablet, Take 1 mg by mouth 3 (three) times daily., Disp: , Rfl:  .  Cyanocobalamin (B-12) 500 MCG SUBL, One SL QD, Disp: 90 tablet, Rfl: 1 .  cyclobenzaprine (FLEXERIL) 5 MG tablet, 1-2 tid prn muscle spasm, Disp: 30 tablet, Rfl: 0 .  DEXILANT 60 MG capsule, Take 1 capsule by mouth daily., Disp: 90 capsule, Rfl: 0 .  diclofenac Sodium (VOLTAREN) 1 % GEL, Apply 4 grams topically four times daily., Disp: 400 g, Rfl: 1 .  divalproex (DEPAKOTE ER) 500 MG 24 hr tablet, Take 1,000 mg by mouth daily. , Disp: , Rfl: 1 .  docusate sodium (COLACE) 50 MG capsule, Take 50 mg by mouth at bedtime., Disp: , Rfl:  .  fluticasone (FLOVENT HFA) 110 MCG/ACT inhaler, Inhale 2 puffs into the lungs every 12 (twelve) hours., Disp: , Rfl:  .  FREESTYLE LITE test strip, CHECK BLOOD SUGAR 3 TO 4 TIMES DAILY, Disp: 150 each, Rfl: 11 .  hydrochlorothiazide (HYDRODIURIL) 12.5 MG tablet, Take 1 tablet by mouth once daily., Disp: 90 tablet, Rfl: 0 .  HYDROmorphone (DILAUDID) 4 MG tablet, Take 4 mg by mouth 4 (four) times daily as needed for severe pain., Disp: , Rfl:  .  hydrOXYzine (ATARAX/VISTARIL) 25 MG tablet, Take 25 mg by mouth 2 (two) times daily. , Disp: , Rfl:  .  lidocaine (XYLOCAINE) 2 % solution, , Disp: , Rfl:  .  LORazepam (ATIVAN) 1 MG tablet, TK 1 T PO TID, Disp: , Rfl:  .  losartan (COZAAR) 25 MG tablet, Take 1 tablet (25 mg total) by mouth daily., Disp: 90 tablet, Rfl: 0 .  metFORMIN (GLUCOPHAGE) 500 MG tablet, Take 1 tablet (500 mg total) by mouth 2 (two) times daily with a meal.,  Disp: 180 tablet, Rfl: 1 .  NARCAN 4 MG/0.1ML LIQD nasal spray kit, Place 1 spray into the nose as directed., Disp: , Rfl: 0 .  Norgestimate-Ethinyl Estradiol Triphasic (TRI-ESTARYLLA) 0.18/0.215/0.25 MG-35 MCG tablet, Take 1 tablet by mouth daily., Disp: 1 Package, Rfl: 0 .  PAIN MANAGEMENT INTRATHECAL, IT, PUMP, 1 each by Intrathecal route. Intrathecal (IT) medication: fentanyl, clonidine,, Disp: , Rfl:  .  polyethylene glycol powder (GLYCOLAX/MIRALAX) 17 GM/SCOOP powder, Mix 17 grams (1 capful) with 8 ounces of fluid and take by mouth daily for bowels., Disp: 255 g, Rfl: 1 .  promethazine (PHENERGAN) 25 MG tablet, Take 1 tablet (25 mg total) by mouth every 6 (six) hours as needed for nausea or vomiting., Disp: 30 tablet, Rfl: 2 .  QUEtiapine (SEROQUEL XR) 400 MG 24 hr tablet, Take 400 mg by mouth 2 (two) times daily., Disp: , Rfl:  .  traZODone (DESYREL) 100 MG tablet, 5 qhs prescribed by psychiatry, Disp: 30 tablet, Rfl: 0 .  triamcinolone cream (KENALOG) 0.1 %, Apply 1 application topically daily as needed (for itching)., Disp: 30 g, Rfl: 2 .  Vitamin D, Ergocalciferol, (DRISDOL) 1.25 MG (50000 UT) CAPS capsule, Take 1 capsule by mouth twice weekly with a fatty meal., Disp: 24 capsule, Rfl: 0 .  vortioxetine HBr (TRINTELLIX) 10 MG TABS tablet, Take 10 mg by mouth at bedtime., Disp: , Rfl:  .  VRAYLAR 6 MG CAPS, Take 1 capsule by mouth daily., Disp: , Rfl:    Allergies  Allergen Reactions  . Lisinopril Hives    Other reaction(s): anaphylaxis/angioedema  . Latex Hives  . Adhesive [Tape] Rash  . Sulfamethoxazole-Trimethoprim Diarrhea    Other reaction(s): gi distress     Review of Systems   + for mild vaginal itching, chronic back pain, chronic nausea, negative for pelvic or abdominal pain, dysuria or frequency.  Today's Vitals   07/08/19 0955  BP: 118/76  Pulse: 71  Temp: 98.3 F (36.8 C)  TempSrc: Oral  SpO2: 99%  Weight: 159 lb 6.4 oz (72.3 kg)  Height: 5' 3.8" (1.621 m)    Body mass index is 27.53 kg/m.   Objective:  Physical Exam Vitals and nursing note reviewed.  Constitutional:      General: She is not in acute distress.    Appearance: She is not toxic-appearing.  HENT:     Head: Atraumatic.     Right Ear: External ear normal.     Left Ear: External ear  normal.  Eyes:     General: No scleral icterus.    Conjunctiva/sclera: Conjunctivae normal.  Pulmonary:     Effort: Pulmonary effort is normal.  Abdominal:     General: Bowel sounds are normal.     Palpations: Abdomen is soft.  Genitourinary:    General: Normal vulva.     Vagina: Vaginal discharge present.     Comments: Small white discharge, but I did not noticed any odor. Musculoskeletal:        General: Normal range of motion.     Cervical back: Neck supple.  Skin:    General: Skin is warm and dry.     Findings: No lesion or rash.  Neurological:     Mental Status: She is alert and oriented to person, place, and time.  Psychiatric:        Mood and Affect: Mood normal.        Behavior: Behavior normal.        Thought Content: Thought content normal.        Judgment: Judgment normal.    Assessment And Plan:    1. Nausea- chronic. - promethazine (PHENERGAN) 25 MG tablet; Take 1 tablet (25 mg total) by mouth every 6 (six) hours as needed for nausea or vomiting.  Dispense: 90 tablet; Refill: 0  2. Chronic pain syndrome- may continue care with pain clinic  3. Acute vaginitis- may be due to soap she used, but we will r/o an infection. We will call her when results come back.  - Cervicovaginal ancillary only      RODRIGUEZ-SOUTHWORTH, PA-C    THE PATIENT IS ENCOURAGED TO PRACTICE SOCIAL DISTANCING DUE TO THE COVID-19 PANDEMIC.    

## 2019-07-12 LAB — CERVICOVAGINAL ANCILLARY ONLY
Bacterial Vaginitis (gardnerella): NEGATIVE
Candida Glabrata: NEGATIVE
Candida Vaginitis: NEGATIVE
Comment: NEGATIVE
Comment: NEGATIVE
Comment: NEGATIVE
Comment: NEGATIVE
Trichomonas: NEGATIVE

## 2019-07-13 ENCOUNTER — Telehealth: Payer: Self-pay

## 2019-07-13 NOTE — Telephone Encounter (Signed)
Spoke w/pt gave her provider message  Rodriguez-Southworth, Sandrea Matte  Candiss Norse T, CMA  Please call pt and inform her that her vaginal swat test were negative for yeast, trichomonas or bacterial vaginitis.

## 2019-07-14 ENCOUNTER — Encounter: Payer: Self-pay | Admitting: Internal Medicine

## 2019-07-20 ENCOUNTER — Telehealth: Payer: Self-pay

## 2019-07-20 NOTE — Telephone Encounter (Signed)
LVM for pt to call the office to have appt 08/05/2019 rescheduled due to provider schedule change

## 2019-07-21 ENCOUNTER — Telehealth: Payer: Self-pay

## 2019-07-21 NOTE — Telephone Encounter (Signed)
LVM x2 letting pt know her 08/05/2019 appt has been cancelled due to provider schedule changing, also stated pt needs to call the office to have her appt rescheduled

## 2019-07-24 ENCOUNTER — Inpatient Hospital Stay: Admission: RE | Admit: 2019-07-24 | Payer: Medicare Other | Source: Ambulatory Visit

## 2019-07-24 ENCOUNTER — Other Ambulatory Visit: Payer: Medicare Other

## 2019-07-27 ENCOUNTER — Ambulatory Visit: Payer: Medicare Other | Admitting: Orthopaedic Surgery

## 2019-07-31 ENCOUNTER — Other Ambulatory Visit: Payer: Self-pay | Admitting: Internal Medicine

## 2019-08-04 ENCOUNTER — Other Ambulatory Visit: Payer: Self-pay | Admitting: Internal Medicine

## 2019-08-04 ENCOUNTER — Other Ambulatory Visit: Payer: Medicare Other

## 2019-08-05 ENCOUNTER — Other Ambulatory Visit: Payer: Medicare Other

## 2019-08-05 ENCOUNTER — Ambulatory Visit: Payer: Medicare Other | Admitting: Internal Medicine

## 2019-08-09 ENCOUNTER — Telehealth: Payer: Medicare Other

## 2019-08-10 ENCOUNTER — Other Ambulatory Visit: Payer: Self-pay

## 2019-08-10 ENCOUNTER — Ambulatory Visit: Payer: Medicare Other | Admitting: Orthopaedic Surgery

## 2019-08-10 MED ORDER — ATORVASTATIN CALCIUM 10 MG PO TABS: 10.0000 mg | ORAL_TABLET | Freq: Every day | ORAL | 0 refills | Status: DC

## 2019-08-16 ENCOUNTER — Other Ambulatory Visit: Payer: Self-pay

## 2019-08-16 DIAGNOSIS — R110 Nausea: Secondary | ICD-10-CM

## 2019-08-16 MED ORDER — PROMETHAZINE HCL 25 MG PO TABS: 25.0000 mg | ORAL_TABLET | Freq: Four times a day (QID) | ORAL | 0 refills | Status: DC | PRN

## 2019-08-17 ENCOUNTER — Ambulatory Visit (INDEPENDENT_AMBULATORY_CARE_PROVIDER_SITE_OTHER): Payer: Medicare Other

## 2019-08-17 ENCOUNTER — Ambulatory Visit: Payer: Self-pay

## 2019-08-17 DIAGNOSIS — E119 Type 2 diabetes mellitus without complications: Secondary | ICD-10-CM

## 2019-08-17 DIAGNOSIS — E063 Autoimmune thyroiditis: Secondary | ICD-10-CM

## 2019-08-17 DIAGNOSIS — G894 Chronic pain syndrome: Secondary | ICD-10-CM

## 2019-08-17 DIAGNOSIS — I10 Essential (primary) hypertension: Secondary | ICD-10-CM | POA: Diagnosis not present

## 2019-08-17 DIAGNOSIS — R296 Repeated falls: Secondary | ICD-10-CM

## 2019-08-17 NOTE — Chronic Care Management (AMB) (Signed)
Chronic Care Management    Social Work Follow Up Note  08/17/2019 Name: Linda Mercado MRN: [id number] DOB: 08/26/1965  Linda Mercado is a 54 y.o. year old female who is a primary care patient of [place], [name], [place]. The CCM team was consulted for assistance with care coordination.   Review of patient status, including review of consultants reports, other relevant assessments, and collaboration with appropriate care team members and the patient's provider was performed as part of comprehensive patient evaluation and provision of chronic care management services.    SW assisted with care coordination in response to requested collaboration from RN Case Freight forwarder.  Outpatient Encounter Medications as of 08/17/2019  Medication Sig Note  . albuterol (VENTOLIN HFA) 108 (90 Base) MCG/ACT inhaler Inhale 2 puffs into the lungs every 6 (six) hours as needed for wheezing or shortness of breath.   . AMITIZA 24 MCG capsule 24 mcg 2 (two) times daily.   [name] atenolol (TENORMIN) 100 MG tablet Take 1 tablet by mouth daily.   [name] atorvastatin (LIPITOR) 10 MG tablet Take 1 tablet (10 mg total) by mouth daily.   . calcium gluconate 500 MG tablet Take one Monday through Friday only.   . CAPLYTA 42 MG CAPS TK 1 C PO IN THE EVE WF   . CAPTOPRIL PO Take 1 tablet by mouth at bedtime.   . clonazePAM (KLONOPIN) 1 MG tablet Take 1 mg by mouth 3 (three) times daily.   . Cyanocobalamin (B-12) 500 MCG SUBL One SL QD   . cyclobenzaprine (FLEXERIL) 5 MG tablet 1-2 tid prn muscle spasm   . DEXILANT 60 MG capsule Take 1 capsule by mouth daily.   . diclofenac Sodium (VOLTAREN) 1 % GEL Apply 4 grams topically four times daily.   . divalproex (DEPAKOTE ER) 500 MG 24 hr tablet Take 1,000 mg by mouth daily.  03/18/2018: Strength and sig verified by daughter, [facility], and [place]  . docusate sodium (COLACE) 50 MG capsule Take 50 mg by mouth at bedtime.   . fluticasone (FLOVENT HFA) 110 MCG/ACT inhaler Inhale 2 puffs  into the lungs every 12 (twelve) hours.   [name] FREESTYLE LITE test strip CHECK BLOOD SUGAR 3 TO 4 TIMES DAILY   . hydrochlorothiazide (HYDRODIURIL) 12.5 MG tablet Take 1 tablet by mouth once daily.   [name] HYDROmorphone (DILAUDID) 4 MG tablet Take 4 mg by mouth 4 (four) times daily as needed for severe pain.   . hydrOXYzine (ATARAX/VISTARIL) 25 MG tablet Take 25 mg by mouth 2 (two) times daily.    [name] lidocaine (XYLOCAINE) 2 % solution    . LORazepam (ATIVAN) 1 MG tablet TK 1 T PO TID   . losartan (COZAAR) 25 MG tablet Take 1 tablet by mouth daily.   . metFORMIN (GLUCOPHAGE) 500 MG tablet Take 1 tablet (500 mg total) by mouth 2 (two) times daily with a meal.   . NARCAN 4 MG/0.1ML LIQD nasal spray kit Place 1 spray into the nose as directed.   . Norgestimate-Ethinyl Estradiol Triphasic (TRI-ESTARYLLA) 0.18/0.215/0.25 MG-35 MCG tablet Take 1 tablet by mouth daily.   [name] PAIN MANAGEMENT INTRATHECAL, IT, PUMP 1 each by Intrathecal route. Intrathecal (IT) medication: fentanyl, clonidine,   . polyethylene glycol powder (GLYCOLAX/MIRALAX) 17 GM/SCOOP powder Mix 17 grams (1 capful) with 8 ounces of fluid and take by mouth daily for bowels.   . promethazine (PHENERGAN) 25 MG tablet Take 1 tablet (25 mg total) by mouth every 6 (six) hours as needed for nausea or  vomiting.   [name] QUEtiapine (SEROQUEL XR) 400 MG 24 hr tablet Take 400 mg by mouth 2 (two) times daily.   . traZODone (DESYREL) 100 MG tablet 5 qhs prescribed by psychiatry   . triamcinolone cream (KENALOG) 0.1 % Apply 1 application topically daily as needed (for itching).   . Vitamin D, Ergocalciferol, (DRISDOL) 1.25 MG (50000 UT) CAPS capsule Take 1 capsule by mouth twice weekly with a fatty meal.   . vortioxetine HBr (TRINTELLIX) 10 MG TABS tablet Take 10 mg by mouth at bedtime.   [name] VRAYLAR 6 MG CAPS Take 1 capsule by mouth daily.    No facility-administered encounter medications on file as of 08/17/2019.     Goals Addressed            This Visit's  Progress   . Assist with completon of PCS application in order to receive in home care       Current Barriers:  . Inability to safely perform ADL's and iADL's due to chronic pain syndrome and ongoing fall risk . Difficulty with disease management of DM II . Care Coordination barrier related to completion of PCS application  Social Work Clinical Goal(s):  [name] Over the next 45 days the patient will work with CM SW to determine eligibility of personal care services under Medicaid benefit  CCM SW Interventions: Completed 08/17/19 . Collaboration with CM RN Case Manager requesting SW assistance with Medicaid PCS application o RN Case Manager reports patient confirmed Medicaid coverage as full coverage and is requesting in home care services . Initiation of PCS applications by CM SW . Collaboration with patient primary care team requesting provider review and complete provider portion of application prior to submission to [facility] . SW to continue to follow  Patient Self Care Activities:  . Patient verbalizes understanding of plan to work with CM team to address care coordination needs . Does not attend all scheduled provider appointments . Unable to perform ADLs independently . Unable to perform IADLs independently  Initial goal documentation         Follow Up Plan: SW will follow up with patient by phone over the next two weeks.   Daneen Schick, BSW, CDP Social Worker, Certified Dementia Practitioner [facility] [phone]  Total time spent performing care coordination and/or care management activities with the patient by phone or face to face = 15 minutes.

## 2019-08-17 NOTE — Patient Instructions (Signed)
Social Worker Visit Information  Goals we discussed today:  Goals Addressed            This Visit's Progress   . Assist with completon of PCS application in order to receive in home care       Current Barriers:  . Inability to safely perform ADL's and iADL's due to chronic pain syndrome and ongoing fall risk . Difficulty with disease management of DM II . Care Coordination barrier related to completion of PCS application  Social Work Clinical Goal(s):  [name] Over the next 45 days the patient will work with CM SW to determine eligibility of personal care services under Medicaid benefit  CCM SW Interventions: Completed 08/17/19 . Collaboration with CM RN Case Manager requesting SW assistance with Medicaid PCS application o RN Case Manager reports patient confirmed Medicaid coverage as full coverage and is requesting in home care services . Initiation of PCS applications by CM SW . Collaboration with patient primary care team requesting provider review and complete provider portion of application prior to submission to [facility] . SW to continue to follow  Patient Self Care Activities:  . Patient verbalizes understanding of plan to work with CM team to address care coordination needs . Does not attend all scheduled provider appointments . Unable to perform ADLs independently . Unable to perform IADLs independently  Initial goal documentation         Follow Up Plan: SW will follow up with patient by phone over the next two weeks.  Daneen Schick, BSW, CDP Social Worker, Certified Dementia Practitioner [place] [facility] [facility] [phone]

## 2019-08-18 ENCOUNTER — Other Ambulatory Visit: Payer: Self-pay | Admitting: Gastroenterology

## 2019-08-18 ENCOUNTER — Other Ambulatory Visit: Payer: Medicare Other

## 2019-08-19 ENCOUNTER — Ambulatory Visit
Admission: RE | Admit: 2019-08-19 | Discharge: 2019-08-19 | Disposition: A | Discharge status: Home / Self Care | Payer: Medicare Other | Source: Ambulatory Visit | Attending: Orthopaedic Surgery | Admitting: Orthopaedic Surgery

## 2019-08-19 ENCOUNTER — Other Ambulatory Visit: Payer: Self-pay

## 2019-08-19 DIAGNOSIS — G8929 Other chronic pain: Secondary | ICD-10-CM

## 2019-08-19 DIAGNOSIS — M25562 Pain in left knee: Secondary | ICD-10-CM

## 2019-08-19 NOTE — Patient Instructions (Signed)
Visit Information  Goals Addressed      Patient Stated   . "I may make my aunt my power of attorney" (pt-stated)   Not on track    Current Barriers:  [name] Knowledge Deficits related to how to complete Advanced directives  . No Advanced Directives in place  Nurse Case Manager Clinical Goal(s):  [name] Over the next 90 days, patient will verbalize understanding of plan for completion of Advanced Directives  goal date extended  CCM RN CM Interventions:  08/17/19 spoke to patient . Determined patient has not completed Advanced Directives at this time . Mailed new Advanced Directive packet to patient's home for completion . Discussed plans with patient for ongoing care management follow up and provided patient with direct contact information for care management team  Patient Self Care Activities:  . Currently UNABLE TO independently perform self care  Please see past updates related to this goal by clicking on the "Past Updates" button in the selected goal      . COMPLETED: "I've had a couple of recent falls" (pt-stated)       Current Barriers:  [name] Knowledge Deficits related to origin of Pain and loss of muscle control . Lacks caregiver support.   . Frequent falls secondary to pain and loss of muscle control of lower extremities  Nurse Case Manager Clinical Goal(s):  [name] Over the next 30 days, patient will not experience ED visits and or IP admissions related to falls or injury from falls. Goal Met . Over the next 60 days, patient will have a new patient appointment with a Neurologist, and or a movement disorder specialist. (target goal date received due to COVID-19 delays) Goal Met . 12/10/18: Over the next 60 days patient will have increased knowledge of her treatment plan related to her pain management and impaired physical mobility and will report 100% adherence with MD recommendations. Goal Met   CCM RN CM Interventions:  Completed on 08/17/19: completed with patient   Assessed for  ongoing falls (pt continues to have falls with ambulation and sometimes requires 2 people to assist her to the bathroom or to shower)   Discussed patient has reported her progressive condition to both her Pain management and Orthopedic MD's  Determined patient is being evaluated by Orthopedics and is scheduled for bilateral knee MRI on 07/2819 @12 :40 PM   Discussed patient's next MD f/u with Pain management is scheduled for 09/12/19 at which time she will have her pain pump refilled, she plans to discussed possibly having the pain pump removed   Assessed for DME needs, patient denies; Assessed for emergency alert system, patient declines and states she keeps her cell phone with her at all times  Patient Self Care Activities:  . Currently UNABLE TO independently participate in self care  Please see past updates related to this goal by clicking on the "Past Updates" button in the selected goal       . "My bones hurt" "The pain pump is not helping" (pt-stated)       Current Barriers:  [name] Knowledge Deficits related to unknown etiology for chronic debilitating musculoskeletal and bone Pain  . Chronic Disease Management support and education needs related to Hashimoto's, Chronic Pain Syndrome, DMII, HTN . Lacks caregiver support  Nurse Case Manager Clinical Goal(s):  [name] Over the next 90 days, patient will work with her Pain management MD to establish a plan of care that will provide her with decreased pain and improved quality of life . Over  the next 90 days, patient will discuss getting a referral for the [facility] with her Pain management MD for consideration of a second opinion   CCM RN CM Interventions:  08/17/19 call completed with patient   . Evaluation of current treatment plan related to chronic pain management and patient's adherence to plan as established by provider . Reviewed medications with patient and discussed patient's ongoing follow up with her Pain management, Dr. Debarah Crape MD; discussed patient's next OV is scheduled for 09/12/19; patient continues to be debilitating and uncontrolled with use of intrathecal pain pump . Provided patient with active listening and validated her feelings of despair over her declining health . Education provided about the [facility] and discussed a referral is needed but may be appropriate for a second opinion . Determined patient may consider asking Dr. Maryruth Eve for a referral based on his recommendation for removal of the pain pump . Reviewed scheduled/upcoming provider appointments including bilateral knee MRI, Pain management and PCP f/u . Reviewed and discussed Pain management OV note from 06/07/20 with the follow treatment plan noted; Plan: 1. At each visit, patient complains of ever-increasing pain that never gets any better, though her meds and pain pump do provide some relief. We will increase her pain pump at this visit by 10%. If she continues to complain of increasing pain, we may consider that she has an opioid-induced hyperalgesia. In that case, consider weaning her off of opioids all together.  2. Follow up scheduled for 07/28/19 for pump refill and evaluate for opioid weaning at that time. 3. Refill Dilaudid 2 mg q8hrs DNF 11/21, 12/20 . Discussed plans with patient for ongoing care management follow up and provided patient with direct contact information for care management team  Patient Self Care Activities: . Unable to performs ADL's independently . Unable to performs IADL's independently . Calls provider office for new concerns or questions  Please see past updates related to this goal by clicking on the "Past Updates" button in the selected goal       . "to learn more about Hashimoto's" (pt-stated)       Current Barriers:  [name] Knowledge Deficits related to disease process and Self Health management of Hashimoto's thyroiditis  . Lacks caregiver support.  . Chronic Disease Management support and education needs  related to Hashimoto's, Chronic Pain Syndrome, DMII, HTN  Nurse Case Manager Clinical Goal(s):  [name] Over the next 90 days, patient will verbalize basic understanding of Hashimoto's thyroiditis disease process and self health management plan as evidenced by patient will be able to verbalize what Hashimoto's is, how she will Self management this condition and when to call the doctor if needed for symptoms suggestive of thyroiditis   Interventions:  . Evaluation of current treatment plan related to Hashimoto's thyroiditis and patient's adherence to plan as established by provider. . Provided education to patient re: disease process for Hashimoto's disease; recommendations for ongoing MD f/u; discussed potential triggers for thyroiditis  . Discussed plans with patient for ongoing care management follow up and provided patient with direct contact information for care management team . Provided patient with printed educational materials related to Hashimoto's Thyroiditis  Patient Self Care Activities:  . Lacks social connections . Unable to perform ADLs independently . Unable to perform IADLs independently  Initial goal documentation        The patient verbalized understanding of instructions provided today and declined a print copy of patient instruction materials.   Telephone follow up appointment  with care management team member scheduled for: 09/16/19  Barb Merino, RN, BSN, CCM Care Management Coordinator [facility] [place] [facility]  Direct Phone: [phone]

## 2019-08-19 NOTE — Chronic Care Management (AMB) (Signed)
Chronic Care Management   Follow Up Note   08/17/2019 Name: Linda Mercado MRN: [id number] DOB: 1966-07-08  Referred by: Shelby Mattocks, PA-C Reason for referral : Chronic Care Management (CCM RNCM Telephone Follow up )   Linda Mercado is a 54 y.o. year old female who is a primary care patient of [place], [name], [place]. The CCM team was consulted for assistance with chronic disease management and care coordination needs.    Review of patient status, including review of consultants reports, relevant laboratory and other test results, and collaboration with appropriate care team members and the patient's provider was performed as part of comprehensive patient evaluation and provision of chronic care management services.    SDOH (Social Determinants of Health) screening performed today: assistance needed with PCS application. See Care Plan for related entries.   Received an inbound call with voice message from patient requesting a return phone call. Placed an outbound call to patient for a CCM RNCM follow up.   Outpatient Encounter Medications as of 08/17/2019  Medication Sig Note  . albuterol (VENTOLIN HFA) 108 (90 Base) MCG/ACT inhaler Inhale 2 puffs into the lungs every 6 (six) hours as needed for wheezing or shortness of breath.   . AMITIZA 24 MCG capsule 24 mcg 2 (two) times daily.   [name] atenolol (TENORMIN) 100 MG tablet Take 1 tablet by mouth daily.   [name] atorvastatin (LIPITOR) 10 MG tablet Take 1 tablet (10 mg total) by mouth daily.   . calcium gluconate 500 MG tablet Take one Monday through Friday only.   . CAPLYTA 42 MG CAPS TK 1 C PO IN THE EVE WF   . CAPTOPRIL PO Take 1 tablet by mouth at bedtime.   . clonazePAM (KLONOPIN) 1 MG tablet Take 1 mg by mouth 3 (three) times daily.   . Cyanocobalamin (B-12) 500 MCG SUBL One SL QD   . cyclobenzaprine (FLEXERIL) 5 MG tablet 1-2 tid prn muscle spasm   . DEXILANT 60 MG capsule Take 1 capsule by mouth daily.   .  diclofenac Sodium (VOLTAREN) 1 % GEL Apply 4 grams topically four times daily.   . divalproex (DEPAKOTE ER) 500 MG 24 hr tablet Take 1,000 mg by mouth daily.  03/18/2018: Strength and sig verified by daughter, [facility], and [place]  . docusate sodium (COLACE) 50 MG capsule Take 50 mg by mouth at bedtime.   . fluticasone (FLOVENT HFA) 110 MCG/ACT inhaler Inhale 2 puffs into the lungs every 12 (twelve) hours.   [name] FREESTYLE LITE test strip CHECK BLOOD SUGAR 3 TO 4 TIMES DAILY   . hydrochlorothiazide (HYDRODIURIL) 12.5 MG tablet Take 1 tablet by mouth once daily.   [name] HYDROmorphone (DILAUDID) 4 MG tablet Take 4 mg by mouth 4 (four) times daily as needed for severe pain.   . hydrOXYzine (ATARAX/VISTARIL) 25 MG tablet Take 25 mg by mouth 2 (two) times daily.    [name] lidocaine (XYLOCAINE) 2 % solution    . LORazepam (ATIVAN) 1 MG tablet TK 1 T PO TID   . losartan (COZAAR) 25 MG tablet Take 1 tablet by mouth daily.   . metFORMIN (GLUCOPHAGE) 500 MG tablet Take 1 tablet (500 mg total) by mouth 2 (two) times daily with a meal.   . NARCAN 4 MG/0.1ML LIQD nasal spray kit Place 1 spray into the nose as directed.   . Norgestimate-Ethinyl Estradiol Triphasic (TRI-ESTARYLLA) 0.18/0.215/0.25 MG-35 MCG tablet Take 1 tablet by mouth daily.   [name] PAIN MANAGEMENT INTRATHECAL, IT, PUMP 1  each by Intrathecal route. Intrathecal (IT) medication: fentanyl, clonidine,   . polyethylene glycol powder (GLYCOLAX/MIRALAX) 17 GM/SCOOP powder Mix 17 grams (1 capful) with 8 ounces of fluid and take by mouth daily for bowels.   . promethazine (PHENERGAN) 25 MG tablet Take 1 tablet (25 mg total) by mouth every 6 (six) hours as needed for nausea or vomiting.   [name] QUEtiapine (SEROQUEL XR) 400 MG 24 hr tablet Take 400 mg by mouth 2 (two) times daily.   . traZODone (DESYREL) 100 MG tablet 5 qhs prescribed by psychiatry   . triamcinolone cream (KENALOG) 0.1 % Apply 1 application topically daily as needed (for itching).   . Vitamin D,  Ergocalciferol, (DRISDOL) 1.25 MG (50000 UT) CAPS capsule Take 1 capsule by mouth twice weekly with a fatty meal.   . vortioxetine HBr (TRINTELLIX) 10 MG TABS tablet Take 10 mg by mouth at bedtime.   [name] VRAYLAR 6 MG CAPS Take 1 capsule by mouth daily.    No facility-administered encounter medications on file as of 08/17/2019.     Objective:  Lab Results  Component Value Date   HGBA1C 6.5 (H) 04/29/2019   HGBA1C 6.5 (H) 09/17/2018   HGBA1C 5.7 (H) 03/20/2018   Lab Results  Component Value Date   LDLCALC 48 04/29/2019   CREATININE 0.85 09/17/2018   BP Readings from Last 3 Encounters:  07/08/19 118/76  05/06/19 126/72  05/06/19 114/82    Goals Addressed      Patient Stated   . "I may make my aunt my power of attorney" (pt-stated)   Not on track    Current Barriers:  [name] Knowledge Deficits related to how to complete Advanced directives  . No Advanced Directives in place  Nurse Case Manager Clinical Goal(s):  [name] Over the next 90 days, patient will verbalize understanding of plan for completion of Advanced Directives  goal date extended  CCM RN CM Interventions:  08/17/19 spoke to patient . Determined patient has not completed Advanced Directives at this time . Mailed new Advanced Directive packet to patient's home for completion . Discussed plans with patient for ongoing care management follow up and provided patient with direct contact information for care management team  Patient Self Care Activities:  . Currently UNABLE TO independently perform self care  Please see past updates related to this goal by clicking on the "Past Updates" button in the selected goal      . COMPLETED: "I've had a couple of recent falls" (pt-stated)       Current Barriers:  [name] Knowledge Deficits related to origin of Pain and loss of muscle control . Lacks caregiver support.   . Frequent falls secondary to pain and loss of muscle control of lower extremities  Nurse Case Manager Clinical Goal(s):   [name] Over the next 30 days, patient will not experience ED visits and or IP admissions related to falls or injury from falls. Goal Met . Over the next 60 days, patient will have a new patient appointment with a Neurologist, and or a movement disorder specialist. (target goal date received due to COVID-19 delays) Goal Met . 12/10/18: Over the next 60 days patient will have increased knowledge of her treatment plan related to her pain management and impaired physical mobility and will report 100% adherence with MD recommendations. Goal Met   CCM RN CM Interventions:  Completed on 08/17/19: completed with patient   Assessed for ongoing falls (pt continues to have falls with ambulation and sometimes requires 2  people to assist her to the bathroom or to shower)   Discussed patient has reported her progressive condition to both her Pain management and Orthopedic MD's  Determined patient is being evaluated by Orthopedics and is scheduled for bilateral knee MRI on 07/2819 @12 :40 PM   Discussed patient's next MD f/u with Pain management is scheduled for 09/12/19 at which time she will have her pain pump refilled, she plans to discussed possibly having the pain pump removed   Assessed for DME needs, patient denies; Assessed for emergency alert system, patient declines and states she keeps her cell phone with her at all times  Patient Self Care Activities:  . Currently UNABLE TO independently participate in self care  Please see past updates related to this goal by clicking on the "Past Updates" button in the selected goal      . "My bones hurt" "The pain pump is not helping" (pt-stated)       Current Barriers:  [name] Knowledge Deficits related to unknown etiology for chronic debilitating musculoskeletal and bone Pain  . Chronic Disease Management support and education needs related to Hashimoto's, Chronic Pain Syndrome, DMII, HTN . Lacks caregiver support  Nurse Case Manager Clinical Goal(s):  [name] Over  the next 90 days, patient will work with her Pain management MD to establish a plan of care that will provide her with decreased pain and improved quality of life . Over the next 90 days, patient will discuss getting a referral for the [facility] with her Pain management MD for consideration of a second opinion   CCM RN CM Interventions:  08/17/19 call completed with patient   . Evaluation of current treatment plan related to chronic pain management and patient's adherence to plan as established by provider . Reviewed medications with patient and discussed patient's ongoing follow up with her Pain management, Dr. Debarah Crape MD; discussed patient's next OV is scheduled for 09/12/19; patient continues to be debilitating and uncontrolled with use of intrathecal pain pump . Provided patient with active listening and validated her feelings of despair over her declining health . Education provided about the [facility] and discussed a referral is needed but may be appropriate for a second opinion . Determined patient may consider asking Dr. Maryruth Eve for a referral based on his recommendation for removal of the pain pump . Reviewed scheduled/upcoming provider appointments including bilateral knee MRI, Pain management and PCP f/u . Reviewed and discussed Pain management OV note from 06/07/20 with the follow treatment plan noted; Plan: 1. At each visit, patient complains of ever-increasing pain that never gets any better, though her meds and pain pump do provide some relief. We will increase her pain pump at this visit by 10%. If she continues to complain of increasing pain, we may consider that she has an opioid-induced hyperalgesia. In that case, consider weaning her off of opioids all together.  2. Follow up scheduled for 07/28/19 for pump refill and evaluate for opioid weaning at that time. 3. Refill Dilaudid 2 mg q8hrs DNF 11/21, 12/20 . Discussed plans with patient for ongoing care management follow up  and provided patient with direct contact information for care management team  Patient Self Care Activities: . Unable to performs ADL's independently . Unable to performs IADL's independently . Calls provider office for new concerns or questions  Please see past updates related to this goal by clicking on the "Past Updates" button in the selected goal      . "to learn more  about Hashimoto's" (pt-stated)       Current Barriers:  [name] Knowledge Deficits related to disease process and Self Health management of Hashimoto's thyroiditis  . Lacks caregiver support.  . Chronic Disease Management support and education needs related to Hashimoto's, Chronic Pain Syndrome, DMII, HTN  Nurse Case Manager Clinical Goal(s):  [name] Over the next 90 days, patient will verbalize basic understanding of Hashimoto's thyroiditis disease process and self health management plan as evidenced by patient will be able to verbalize what Hashimoto's is, how she will Self management this condition and when to call the doctor if needed for symptoms suggestive of thyroiditis   Interventions:  . Evaluation of current treatment plan related to Hashimoto's thyroiditis and patient's adherence to plan as established by provider. . Provided education to patient re: disease process for Hashimoto's disease; recommendations for ongoing MD f/u; discussed potential triggers for thyroiditis  . Discussed plans with patient for ongoing care management follow up and provided patient with direct contact information for care management team . Provided patient with printed educational materials related to Hashimoto's Thyroiditis  Patient Self Care Activities:  . Lacks social connections . Unable to perform ADLs independently . Unable to perform IADLs independently  Initial goal documentation         Plan:   Telephone follow up appointment with care management team member scheduled for: 09/16/19  Barb Merino, RN, BSN, CCM Care  Management Coordinator [place] [facility]  Direct Phone: [phone]

## 2019-08-21 ENCOUNTER — Other Ambulatory Visit (HOSPITAL_COMMUNITY)
Admission: RE | Admit: 2019-08-21 | Discharge: 2019-08-21 | Disposition: A | Discharge status: Home / Self Care | Payer: Medicare Other | Source: Ambulatory Visit | Attending: Gastroenterology | Admitting: Gastroenterology

## 2019-08-21 DIAGNOSIS — Z20822 Contact with and (suspected) exposure to covid-19: Secondary | ICD-10-CM | POA: Insufficient documentation

## 2019-08-21 LAB — SARS CORONAVIRUS 2 (TAT 6-24 HRS): SARS Coronavirus 2: NEGATIVE

## 2019-08-24 NOTE — Progress Notes (Signed)
preop call completed/answered questions, arrival time 0830

## 2019-08-25 ENCOUNTER — Encounter (HOSPITAL_COMMUNITY): Payer: Self-pay | Admitting: Gastroenterology

## 2019-08-25 ENCOUNTER — Encounter (HOSPITAL_COMMUNITY)
Admission: RE | Disposition: A | Discharge status: Home / Self Care | Payer: Self-pay | Source: Home / Self Care | Attending: Gastroenterology

## 2019-08-25 ENCOUNTER — Ambulatory Visit (HOSPITAL_COMMUNITY)
Admission: RE | Admit: 2019-08-25 | Discharge: 2019-08-25 | Disposition: A | Discharge status: Home / Self Care | Payer: Medicare Other | Attending: Gastroenterology | Admitting: Gastroenterology

## 2019-08-25 DIAGNOSIS — R001 Bradycardia, unspecified: Secondary | ICD-10-CM | POA: Insufficient documentation

## 2019-08-25 DIAGNOSIS — Z91040 Latex allergy status: Secondary | ICD-10-CM | POA: Insufficient documentation

## 2019-08-25 DIAGNOSIS — E119 Type 2 diabetes mellitus without complications: Secondary | ICD-10-CM | POA: Insufficient documentation

## 2019-08-25 DIAGNOSIS — I10 Essential (primary) hypertension: Secondary | ICD-10-CM | POA: Insufficient documentation

## 2019-08-25 DIAGNOSIS — R1310 Dysphagia, unspecified: Secondary | ICD-10-CM | POA: Diagnosis not present

## 2019-08-25 DIAGNOSIS — Z91048 Other nonmedicinal substance allergy status: Secondary | ICD-10-CM | POA: Insufficient documentation

## 2019-08-25 DIAGNOSIS — Z833 Family history of diabetes mellitus: Secondary | ICD-10-CM | POA: Insufficient documentation

## 2019-08-25 DIAGNOSIS — K219 Gastro-esophageal reflux disease without esophagitis: Secondary | ICD-10-CM | POA: Diagnosis not present

## 2019-08-25 DIAGNOSIS — Z8249 Family history of ischemic heart disease and other diseases of the circulatory system: Secondary | ICD-10-CM | POA: Diagnosis not present

## 2019-08-25 DIAGNOSIS — Z841 Family history of disorders of kidney and ureter: Secondary | ICD-10-CM | POA: Diagnosis not present

## 2019-08-25 DIAGNOSIS — Z881 Allergy status to other antibiotic agents status: Secondary | ICD-10-CM | POA: Insufficient documentation

## 2019-08-25 DIAGNOSIS — Z825 Family history of asthma and other chronic lower respiratory diseases: Secondary | ICD-10-CM | POA: Insufficient documentation

## 2019-08-25 DIAGNOSIS — G8929 Other chronic pain: Secondary | ICD-10-CM | POA: Diagnosis not present

## 2019-08-25 DIAGNOSIS — Z820 Family history of epilepsy and other diseases of the nervous system: Secondary | ICD-10-CM | POA: Diagnosis not present

## 2019-08-25 DIAGNOSIS — Z888 Allergy status to other drugs, medicaments and biological substances status: Secondary | ICD-10-CM | POA: Diagnosis not present

## 2019-08-25 DIAGNOSIS — M549 Dorsalgia, unspecified: Secondary | ICD-10-CM | POA: Diagnosis not present

## 2019-08-25 DIAGNOSIS — Z818 Family history of other mental and behavioral disorders: Secondary | ICD-10-CM | POA: Insufficient documentation

## 2019-08-25 DIAGNOSIS — E876 Hypokalemia: Secondary | ICD-10-CM | POA: Insufficient documentation

## 2019-08-25 HISTORY — PX: ESOPHAGEAL MANOMETRY: SHX5429

## 2019-08-25 SURGERY — MANOMETRY, ESOPHAGUS

## 2019-08-25 MED ORDER — LIDOCAINE VISCOUS HCL 2 % MT SOLN
OROMUCOSAL | Status: AC
  Filled 2019-08-25: qty 15

## 2019-08-25 SURGICAL SUPPLY — 2 items
FACESHIELD LNG OPTICON STERILE (SAFETY) IMPLANT
GLOVE BIO SURGEON STRL SZ8 (GLOVE) ×4 IMPLANT

## 2019-08-25 NOTE — Progress Notes (Addendum)
Esophageal manometry performed per protocol.  Probe placed at 49 cm at left nare. Patient tolerated well.  Dr. Carol Ada notified of report to be read.

## 2019-08-26 ENCOUNTER — Encounter: Payer: Self-pay | Admitting: *Deleted

## 2019-08-26 ENCOUNTER — Ambulatory Visit: Payer: Medicare Other | Admitting: Internal Medicine

## 2019-08-26 ENCOUNTER — Ambulatory Visit: Payer: Self-pay

## 2019-08-26 DIAGNOSIS — R296 Repeated falls: Secondary | ICD-10-CM

## 2019-08-26 DIAGNOSIS — E119 Type 2 diabetes mellitus without complications: Secondary | ICD-10-CM

## 2019-08-26 DIAGNOSIS — G894 Chronic pain syndrome: Secondary | ICD-10-CM

## 2019-08-26 NOTE — Chronic Care Management (AMB) (Signed)
Chronic Care Management    Social Work Follow Up Note  08/26/2019 Name: Linda Mercado MRN: [id number] DOB: 16-Jun-1966  Linda Mercado is a 54 y.o. year old female who is a primary care patient of [place], [name], [place]. The CCM team was consulted for assistance with care coordination.   Review of patient status, including review of consultants reports, other relevant assessments, and collaboration with appropriate care team members and the patient's provider was performed as part of comprehensive patient evaluation and provision of chronic care management services.  .   Outpatient Encounter Medications as of 08/26/2019  Medication Sig Note  . albuterol (VENTOLIN HFA) 108 (90 Base) MCG/ACT inhaler Inhale 2 puffs into the lungs every 6 (six) hours as needed for wheezing or shortness of breath.   . AMITIZA 24 MCG capsule 24 mcg 2 (two) times daily.   [name] atenolol (TENORMIN) 100 MG tablet Take 1 tablet by mouth daily.   [name] atorvastatin (LIPITOR) 10 MG tablet Take 1 tablet (10 mg total) by mouth daily.   . calcium gluconate 500 MG tablet Take one Monday through Friday only.   . CAPLYTA 42 MG CAPS TK 1 C PO IN THE EVE WF   . CAPTOPRIL PO Take 1 tablet by mouth at bedtime.   . clonazePAM (KLONOPIN) 1 MG tablet Take 1 mg by mouth 3 (three) times daily.   . Cyanocobalamin (B-12) 500 MCG SUBL One SL QD   . cyclobenzaprine (FLEXERIL) 5 MG tablet 1-2 tid prn muscle spasm   . DEXILANT 60 MG capsule Take 1 capsule by mouth daily.   . diclofenac Sodium (VOLTAREN) 1 % GEL Apply 4 grams topically four times daily.   . divalproex (DEPAKOTE ER) 500 MG 24 hr tablet Take 1,000 mg by mouth daily.  03/18/2018: Strength and sig verified by daughter, [facility], and [place]  . docusate sodium (COLACE) 50 MG capsule Take 50 mg by mouth at bedtime.   . fluticasone (FLOVENT HFA) 110 MCG/ACT inhaler Inhale 2 puffs into the lungs every 12 (twelve) hours.   [name] FREESTYLE LITE test strip CHECK BLOOD SUGAR 3 TO 4  TIMES DAILY   . hydrochlorothiazide (HYDRODIURIL) 12.5 MG tablet Take 1 tablet by mouth once daily.   [name] HYDROmorphone (DILAUDID) 4 MG tablet Take 4 mg by mouth 4 (four) times daily as needed for severe pain.   . hydrOXYzine (ATARAX/VISTARIL) 25 MG tablet Take 25 mg by mouth 2 (two) times daily.    [name] lidocaine (XYLOCAINE) 2 % solution    . LORazepam (ATIVAN) 1 MG tablet TK 1 T PO TID   . losartan (COZAAR) 25 MG tablet Take 1 tablet by mouth daily.   . metFORMIN (GLUCOPHAGE) 500 MG tablet Take 1 tablet (500 mg total) by mouth 2 (two) times daily with a meal.   . NARCAN 4 MG/0.1ML LIQD nasal spray kit Place 1 spray into the nose as directed.   . Norgestimate-Ethinyl Estradiol Triphasic (TRI-ESTARYLLA) 0.18/0.215/0.25 MG-35 MCG tablet Take 1 tablet by mouth daily.   [name] PAIN MANAGEMENT INTRATHECAL, IT, PUMP 1 each by Intrathecal route. Intrathecal (IT) medication: fentanyl, clonidine,   . polyethylene glycol powder (GLYCOLAX/MIRALAX) 17 GM/SCOOP powder Mix 17 grams (1 capful) with 8 ounces of fluid and take by mouth daily for bowels.   . promethazine (PHENERGAN) 25 MG tablet Take 1 tablet (25 mg total) by mouth every 6 (six) hours as needed for nausea or vomiting.   [name] QUEtiapine (SEROQUEL XR) 400 MG 24 hr tablet Take 400  mg by mouth 2 (two) times daily.   . traZODone (DESYREL) 100 MG tablet 5 qhs prescribed by psychiatry   . triamcinolone cream (KENALOG) 0.1 % Apply 1 application topically daily as needed (for itching).   . Vitamin D, Ergocalciferol, (DRISDOL) 1.25 MG (50000 UT) CAPS capsule Take 1 capsule by mouth twice weekly with a fatty meal.   . vortioxetine HBr (TRINTELLIX) 10 MG TABS tablet Take 10 mg by mouth at bedtime.   [name] VRAYLAR 6 MG CAPS Take 1 capsule by mouth daily.    No facility-administered encounter medications on file as of 08/26/2019.     Goals Addressed            This Visit's Progress   . Assist with completon of PCS application in order to receive in home care        Current Barriers:  . Inability to safely perform ADL's and iADL's due to chronic pain syndrome and ongoing fall risk . Difficulty with disease management of DM II . Care Coordination barrier related to completion of PCS application  Social Work Clinical Goal(s):  [name] Over the next 45 days the patient will work with CM SW to determine eligibility of personal care services under Medicaid benefit  CCM SW Interventions: Completed 2/4//21 . Communication received from patient primary care provider office indicating PCS application was submitted to [facility] via fax . Unsuccessful outbound call placed to the patient in an attempt to communicate process with nursing assessment to verify need o HIPAA compliant voice message left requesting a return call . Scheduled follow up call over the next week to assess goal progression  Patient Self Care Activities:  . Patient verbalizes understanding of plan to work with CM team to address care coordination needs . Does not attend all scheduled provider appointments . Unable to perform ADLs independently . Unable to perform IADLs independently  Please see past updates related to this goal by clicking on the "Past Updates" button in the selected goal          Follow Up Plan: SW will follow up with patient by phone over the next week,   Daneen Schick, BSW, CDP Social Worker, Certified Dementia Practitioner [place] [facility] [place] [facility] [phone]  Total time spent performing care coordination and/or care management activities with the patient by phone or face to face = 5 minutes.

## 2019-08-26 NOTE — H&P (Signed)
  [name] HPI: The patient continues to complain about dysphagia.  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  She felt that her symptoms of dysphagia were in the epigastric lesion.  Past Medical History:  Diagnosis Date  . Acid reflux   . Bradycardia   . Chronic back pain   . Diabetes mellitus without complication ([place])   . Hypertension   . Hypokalemia     Past Surgical History:  Procedure Laterality Date  . APPENDECTOMY    . BACK SURGERY     x6  . CESAREAN SECTION     x3  . HERNIA REPAIR      Family History  Problem Relation Age of Onset  . Diabetes Father   . Hypertension Father   . Kidney disease Mother   . Hypertension Mother   . Bipolar disorder Sister   . Schizophrenia Sister   . Neuropathy Sister   . Hypertension Sister   . Bipolar disorder Brother   . Bipolar disorder Brother   . Schizophrenia Brother   . Bipolar disorder Daughter   . Post-traumatic stress disorder Daughter   . Asthma Daughter   . Depression Daughter   . Bipolar disorder Daughter   . Post-traumatic stress disorder Daughter   . Asthma Daughter   . Depression Daughter   . Asthma Daughter   . Depression Daughter   . Asthma Daughter   . Stroke Neg Hx   . Cancer Neg Hx   . CAD Neg Hx     Social History:  reports that she has never smoked. She has never used smokeless tobacco. She reports previous alcohol use. She reports that she does not use drugs.  Allergies:  Allergies  Allergen Reactions  . Lisinopril Hives    Other reaction(s): anaphylaxis/angioedema  . Latex Hives  . Adhesive [Tape] Rash  . Sulfamethoxazole-Trimethoprim Diarrhea    Other reaction(s): gi distress    Medications: Scheduled: Continuous:  No results found for this or any previous visit (from the past 24 hour(s)).   No results found.  ROS:  As stated above in the HPI otherwise negative.  There were no vitals  taken for this visit.    PE: Nursing procedure.  Assessment/Plan: 1) Dysphagia - [place].  Angelis Gates D 08/26/2019, 12:01 PM

## 2019-08-26 NOTE — Patient Instructions (Signed)
Social Worker Visit Information  Goals we discussed today:  Goals Addressed            This Visit's Progress   . Assist with completon of PCS application in order to receive in home care       Current Barriers:  . Inability to safely perform ADL's and iADL's due to chronic pain syndrome and ongoing fall risk . Difficulty with disease management of DM II . Care Coordination barrier related to completion of PCS application  Social Work Clinical Goal(s):  [name] Over the next 45 days the patient will work with CM SW to determine eligibility of personal care services under Medicaid benefit  CCM SW Interventions: Completed 2/4//21 . Communication received from patient primary care provider office indicating PCS application was submitted to [facility] via fax . Unsuccessful outbound call placed to the patient in an attempt to communicate process with nursing assessment to verify need o HIPAA compliant voice message left requesting a return call . Scheduled follow up call over the next week to assess goal progression  Patient Self Care Activities:  . Patient verbalizes understanding of plan to work with CM team to address care coordination needs . Does not attend all scheduled provider appointments . Unable to perform ADLs independently . Unable to perform IADLs independently  Please see past updates related to this goal by clicking on the "Past Updates" button in the selected goal          Materials Provided: No. Patient not reached.  Follow Up Plan: SW will follow up with patient by phone over the next week   Daneen Schick, BSW, CDP Social Worker, Certified Dementia Practitioner [place] [facility] [place] [facility] [phone]

## 2019-08-27 ENCOUNTER — Telehealth: Payer: Medicare Other

## 2019-08-30 ENCOUNTER — Other Ambulatory Visit: Payer: Self-pay | Admitting: Internal Medicine

## 2019-08-30 DIAGNOSIS — K219 Gastro-esophageal reflux disease without esophagitis: Secondary | ICD-10-CM

## 2019-08-30 DIAGNOSIS — E559 Vitamin D deficiency, unspecified: Secondary | ICD-10-CM

## 2019-09-02 ENCOUNTER — Telehealth: Payer: Medicare Other

## 2019-09-02 ENCOUNTER — Ambulatory Visit: Payer: Self-pay

## 2019-09-02 ENCOUNTER — Other Ambulatory Visit: Payer: Self-pay | Admitting: Internal Medicine

## 2019-09-02 DIAGNOSIS — G894 Chronic pain syndrome: Secondary | ICD-10-CM

## 2019-09-02 DIAGNOSIS — E119 Type 2 diabetes mellitus without complications: Secondary | ICD-10-CM

## 2019-09-02 NOTE — Chronic Care Management (AMB) (Signed)
  Chronic Care Management   Outreach Note  09/02/2019 Name: Linda Mercado MRN: [id number] DOB: 04-Oct-1965  Referred by: Shelby [place], PA-C Reason for referral : Care Coordination   Second unsuccessful outbound call placed to the patient to assist with care coordination needs. SW left a HIPAA compliant voice message requesting a return call.  Follow Up Plan: SW will attempt a third and final call over the next week.  Daneen Schick, BSW, CDP Social Worker, Certified Dementia Practitioner [place] [facility] [place] [facility] [phone]

## 2019-09-06 ENCOUNTER — Ambulatory Visit: Payer: Self-pay

## 2019-09-06 ENCOUNTER — Telehealth: Payer: Medicare Other

## 2019-09-06 DIAGNOSIS — R296 Repeated falls: Secondary | ICD-10-CM

## 2019-09-06 DIAGNOSIS — E119 Type 2 diabetes mellitus without complications: Secondary | ICD-10-CM

## 2019-09-06 DIAGNOSIS — G894 Chronic pain syndrome: Secondary | ICD-10-CM

## 2019-09-06 NOTE — Chronic Care Management (AMB) (Signed)
Chronic Care Management    Social Work Follow Up Note  09/06/2019 Name: Linda Mercado MRN: [id number] DOB: 1966/07/06  Linda Mercado is a 54 y.o. year old female who is a primary care patient of [place], [name], [place]. The CCM team was consulted for assistance with care coordination.   SW placed a third unsuccessful outbound call to the patient in an attempt to assess outcome of PCS application. SW left a HIPAA compliant voice message requesting a return call.  Outpatient Encounter Medications as of 09/06/2019  Medication Sig Note  . albuterol (VENTOLIN HFA) 108 (90 Base) MCG/ACT inhaler Inhale 2 puffs into the lungs every 6 (six) hours as needed for wheezing or shortness of breath.   . AMITIZA 24 MCG capsule 24 mcg 2 (two) times daily.   [name] atenolol (TENORMIN) 100 MG tablet Take 1 tablet by mouth daily.   [name] atorvastatin (LIPITOR) 10 MG tablet Take 1 tablet (10 mg total) by mouth daily.   . calcium gluconate 500 MG tablet Take one Monday through Friday only.   . CAPLYTA 42 MG CAPS TK 1 C PO IN THE EVE WF   . CAPTOPRIL PO Take 1 tablet by mouth at bedtime.   . clonazePAM (KLONOPIN) 1 MG tablet Take 1 mg by mouth 3 (three) times daily.   . Cyanocobalamin (B-12) 500 MCG SUBL One SL QD   . cyclobenzaprine (FLEXERIL) 5 MG tablet 1-2 tid prn muscle spasm   . DEXILANT 60 MG capsule Take 1 capsule by mouth daily.   . diclofenac Sodium (VOLTAREN) 1 % GEL Apply 4 grams topically four times daily.   . divalproex (DEPAKOTE ER) 500 MG 24 hr tablet Take 1,000 mg by mouth daily.  03/18/2018: Strength and sig verified by daughter, [facility], and [place]  . docusate sodium (COLACE) 50 MG capsule Take 50 mg by mouth at bedtime.   . fluticasone (FLOVENT HFA) 110 MCG/ACT inhaler Inhale 2 puffs into the lungs every 12 (twelve) hours.   [name] FREESTYLE LITE test strip CHECK BLOOD SUGAR 3 TO 4 TIMES DAILY   . hydrochlorothiazide (HYDRODIURIL) 12.5 MG tablet Take 1 tablet by mouth once daily.   [name]  HYDROmorphone (DILAUDID) 4 MG tablet Take 4 mg by mouth 4 (four) times daily as needed for severe pain.   . hydrOXYzine (ATARAX/VISTARIL) 25 MG tablet Take 25 mg by mouth 2 (two) times daily.    [name] lidocaine (XYLOCAINE) 2 % solution    . LORazepam (ATIVAN) 1 MG tablet TK 1 T PO TID   . losartan (COZAAR) 25 MG tablet Take 1 tablet by mouth daily.   . metFORMIN (GLUCOPHAGE) 500 MG tablet Take 1 tablet (500 mg total) by mouth 2 (two) times daily with a meal.   . NARCAN 4 MG/0.1ML LIQD nasal spray kit Place 1 spray into the nose as directed.   . Norgestimate-Ethinyl Estradiol Triphasic (TRI-ESTARYLLA) 0.18/0.215/0.25 MG-35 MCG tablet Take 1 tablet by mouth daily.   [name] PAIN MANAGEMENT INTRATHECAL, IT, PUMP 1 each by Intrathecal route. Intrathecal (IT) medication: fentanyl, clonidine,   . polyethylene glycol powder (GLYCOLAX/MIRALAX) 17 GM/SCOOP powder Mix 17 grams (1 capful) with 8 ounces of fluid and take by mouth daily for bowels.   . promethazine (PHENERGAN) 25 MG tablet Take 1 tablet (25 mg total) by mouth every 6 (six) hours as needed for nausea or vomiting.   [name] QUEtiapine (SEROQUEL XR) 400 MG 24 hr tablet Take 400 mg by mouth 2 (two) times daily.   . traZODone (  DESYREL) 100 MG tablet 5 qhs prescribed by psychiatry   . triamcinolone cream (KENALOG) 0.1 % Apply 1 application topically daily as needed (for itching).   . Vitamin D, Ergocalciferol, (DRISDOL) 1.25 MG (50000 UNIT) CAPS capsule Take 1 capsule by mouth twice weekly with a fatty meal.   . vortioxetine HBr (TRINTELLIX) 10 MG TABS tablet Take 10 mg by mouth at bedtime.   [name] VRAYLAR 6 MG CAPS Take 1 capsule by mouth daily.    No facility-administered encounter medications on file as of 09/06/2019.     Goals Addressed            This Visit's Progress   . COMPLETED: Assist with completon of PCS application in order to receive in home care Unable to assess progression of patient goal due to inability to maintain patient contact.        Current Barriers:  . Inability to safely perform ADL's and iADL's due to chronic pain syndrome and ongoing fall risk . Difficulty with disease management of DM II . Care Coordination barrier related to completion of PCS application  Social Work Clinical Goal(s):  [name] Over the next 45 days the patient will work with CM SW to determine eligibility of personal care services under Medicaid benefit  CCM SW Interventions: Completed 2/4//21 . Communication received from patient primary care provider office indicating PCS application was submitted to [facility] via fax . Unsuccessful outbound call placed to the patient in an attempt to communicate process with nursing assessment to verify need o HIPAA compliant voice message left requesting a return call . Scheduled follow up call over the next week to assess goal progression  Patient Self Care Activities:  . Patient verbalizes understanding of plan to work with CM team to address care coordination needs . Does not attend all scheduled provider appointments . Unable to perform ADLs independently . Unable to perform IADLs independently  Please see past updates related to this goal by clicking on the "Past Updates" button in the selected goal          Follow Up Plan: No further follow up planned at this time. The patient will remain active with RN Case Manager. Collaboration with RN Case Manager regarding SW closure.  Daneen Schick, BSW, CDP Social Worker, Certified Dementia Practitioner [place] [facility] [place] [facility] [phone]

## 2019-09-10 ENCOUNTER — Encounter: Payer: Self-pay | Admitting: Internal Medicine

## 2019-09-10 ENCOUNTER — Other Ambulatory Visit: Payer: Self-pay | Admitting: Internal Medicine

## 2019-09-10 DIAGNOSIS — K219 Gastro-esophageal reflux disease without esophagitis: Secondary | ICD-10-CM

## 2019-09-10 DIAGNOSIS — R110 Nausea: Secondary | ICD-10-CM

## 2019-09-10 DIAGNOSIS — E559 Vitamin D deficiency, unspecified: Secondary | ICD-10-CM

## 2019-09-16 ENCOUNTER — Telehealth: Payer: Medicare Other

## 2019-09-17 ENCOUNTER — Emergency Department (HOSPITAL_COMMUNITY): Payer: Medicare Other

## 2019-09-17 ENCOUNTER — Encounter (HOSPITAL_COMMUNITY): Payer: Self-pay

## 2019-09-17 ENCOUNTER — Other Ambulatory Visit: Payer: Self-pay

## 2019-09-17 ENCOUNTER — Emergency Department (HOSPITAL_COMMUNITY)
Admission: EM | Admit: 2019-09-17 | Discharge: 2019-09-17 | Disposition: A | Discharge status: Home / Self Care | Payer: Medicare Other | Attending: Emergency Medicine | Admitting: Emergency Medicine

## 2019-09-17 DIAGNOSIS — Z7984 Long term (current) use of oral hypoglycemic drugs: Secondary | ICD-10-CM | POA: Diagnosis not present

## 2019-09-17 DIAGNOSIS — I10 Essential (primary) hypertension: Secondary | ICD-10-CM | POA: Insufficient documentation

## 2019-09-17 DIAGNOSIS — Z91040 Latex allergy status: Secondary | ICD-10-CM | POA: Insufficient documentation

## 2019-09-17 DIAGNOSIS — G894 Chronic pain syndrome: Secondary | ICD-10-CM

## 2019-09-17 DIAGNOSIS — E119 Type 2 diabetes mellitus without complications: Secondary | ICD-10-CM | POA: Insufficient documentation

## 2019-09-17 DIAGNOSIS — R062 Wheezing: Secondary | ICD-10-CM | POA: Diagnosis not present

## 2019-09-17 DIAGNOSIS — Z20822 Contact with and (suspected) exposure to covid-19: Secondary | ICD-10-CM | POA: Insufficient documentation

## 2019-09-17 DIAGNOSIS — Z79899 Other long term (current) drug therapy: Secondary | ICD-10-CM | POA: Diagnosis not present

## 2019-09-17 DIAGNOSIS — T59811A Toxic effect of smoke, accidental (unintentional), initial encounter: Secondary | ICD-10-CM

## 2019-09-17 DIAGNOSIS — R05 Cough: Secondary | ICD-10-CM | POA: Insufficient documentation

## 2019-09-17 DIAGNOSIS — J4521 Mild intermittent asthma with (acute) exacerbation: Secondary | ICD-10-CM

## 2019-09-17 LAB — RESPIRATORY PANEL BY RT PCR (FLU A&B, COVID)
Influenza A by PCR: NEGATIVE
Influenza B by PCR: NEGATIVE
SARS Coronavirus 2 by RT PCR: NEGATIVE

## 2019-09-17 LAB — COOXEMETRY PANEL
Carboxyhemoglobin: 1.2 % (ref 0.5–1.5)
Methemoglobin: 0.9 % (ref 0.0–1.5)
O2 Saturation: 85.8 %
Total hemoglobin: 13.1 g/dL (ref 12.0–16.0)

## 2019-09-17 LAB — URINALYSIS, ROUTINE W REFLEX MICROSCOPIC
Bilirubin Urine: NEGATIVE
Glucose, UA: NEGATIVE mg/dL
Hgb urine dipstick: NEGATIVE
Ketones, ur: 5 mg/dL — AB
Nitrite: NEGATIVE
Protein, ur: 30 mg/dL — AB
Specific Gravity, Urine: 1.027 (ref 1.005–1.030)
pH: 5 (ref 5.0–8.0)

## 2019-09-17 LAB — CBC WITH DIFFERENTIAL/PLATELET
Abs Immature Granulocytes: 0.01 10*3/uL (ref 0.00–0.07)
Basophils Absolute: 0 10*3/uL (ref 0.0–0.1)
Basophils Relative: 1 %
Eosinophils Absolute: 0.3 10*3/uL (ref 0.0–0.5)
Eosinophils Relative: 4 %
HCT: 39.5 % (ref 36.0–46.0)
Hemoglobin: 12.7 g/dL (ref 12.0–15.0)
Immature Granulocytes: 0 %
Lymphocytes Relative: 54 %
Lymphs Abs: 3.6 10*3/uL (ref 0.7–4.0)
MCH: 27.2 pg (ref 26.0–34.0)
MCHC: 32.2 g/dL (ref 30.0–36.0)
MCV: 84.6 fL (ref 80.0–100.0)
Monocytes Absolute: 0.4 10*3/uL (ref 0.1–1.0)
Monocytes Relative: 6 %
Neutro Abs: 2.3 10*3/uL (ref 1.7–7.7)
Neutrophils Relative %: 35 %
Platelets: 408 10*3/uL — ABNORMAL HIGH (ref 150–400)
RBC: 4.67 MIL/uL (ref 3.87–5.11)
RDW: 13.8 % (ref 11.5–15.5)
WBC: 6.5 10*3/uL (ref 4.0–10.5)
nRBC: 0 % (ref 0.0–0.2)

## 2019-09-17 LAB — BASIC METABOLIC PANEL
Anion gap: 12 (ref 5–15)
BUN: 11 mg/dL (ref 6–20)
CO2: 24 mmol/L (ref 22–32)
Calcium: 9.2 mg/dL (ref 8.9–10.3)
Chloride: 103 mmol/L (ref 98–111)
Creatinine, Ser: 0.83 mg/dL (ref 0.44–1.00)
GFR calc Af Amer: >60 mL/min (ref >60)
GFR calc non Af Amer: >60 mL/min (ref >60)
Glucose, Bld: 127 mg/dL — ABNORMAL HIGH (ref 70–99)
Potassium: 3.5 mmol/L (ref 3.5–5.1)
Sodium: 139 mmol/L (ref 135–145)

## 2019-09-17 LAB — CBG MONITORING, ED: Glucose-Capillary: 136 mg/dL — ABNORMAL HIGH (ref 70–99)

## 2019-09-17 MED ORDER — AEROCHAMBER PLUS FLO-VU LARGE MISC
1.0000 | Freq: Once | Status: AC
  Administered 2019-09-17: 1

## 2019-09-17 MED ORDER — AEROCHAMBER PLUS FLO-VU LARGE MISC
Status: AC
  Filled 2019-09-17: qty 1

## 2019-09-17 MED ORDER — OXYCODONE-ACETAMINOPHEN 5-325 MG PO TABS: 1.0000 | ORAL_TABLET | Freq: Once | ORAL | Status: DC

## 2019-09-17 MED ORDER — IPRATROPIUM-ALBUTEROL 0.5-2.5 (3) MG/3ML IN SOLN
3.0000 mL | Freq: Once | RESPIRATORY_TRACT | Status: AC
  Administered 2019-09-17: 3 mL via RESPIRATORY_TRACT
  Filled 2019-09-17: qty 3

## 2019-09-17 MED ORDER — LOSARTAN POTASSIUM 50 MG PO TABS
25.0000 mg | ORAL_TABLET | Freq: Once | ORAL | Status: AC
  Administered 2019-09-17: 25 mg via ORAL
  Filled 2019-09-17: qty 1

## 2019-09-17 MED ORDER — PREDNISONE 10 MG (21) PO TBPK: ORAL_TABLET | ORAL | 0 refills | Status: DC

## 2019-09-17 MED ORDER — PROMETHAZINE HCL 25 MG/ML IJ SOLN
25.0000 mg | Freq: Once | INTRAMUSCULAR | Status: AC
  Administered 2019-09-17: 25 mg via INTRAVENOUS
  Filled 2019-09-17: qty 1

## 2019-09-17 MED ORDER — ATENOLOL 50 MG PO TABS
100.0000 mg | ORAL_TABLET | Freq: Once | ORAL | Status: AC
  Administered 2019-09-17: 100 mg via ORAL
  Filled 2019-09-17: qty 2

## 2019-09-17 MED ORDER — HYDROMORPHONE HCL 1 MG/ML IJ SOLN
1.0000 mg | Freq: Once | INTRAMUSCULAR | Status: AC
  Administered 2019-09-17: 1 mg via INTRAVENOUS
  Filled 2019-09-17: qty 1

## 2019-09-17 MED ORDER — HYDROCHLOROTHIAZIDE 12.5 MG PO CAPS
12.5000 mg | ORAL_CAPSULE | Freq: Once | ORAL | Status: AC
  Administered 2019-09-17: 12.5 mg via ORAL
  Filled 2019-09-17: qty 1

## 2019-09-17 MED ORDER — ALBUTEROL SULFATE HFA 108 (90 BASE) MCG/ACT IN AERS
4.0000 | INHALATION_SPRAY | Freq: Once | RESPIRATORY_TRACT | Status: AC
  Administered 2019-09-17: 4 via RESPIRATORY_TRACT
  Filled 2019-09-17: qty 6.7

## 2019-09-17 NOTE — ED Notes (Signed)
Pt ambulated to restroom with the assistance of this ED tech. Pt returned safely to bedside.
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
## 2019-09-17 NOTE — ED Notes (Signed)
Patient verbalizes understanding of discharge instructions. Opportunity for questioning and answers were provided. Pt discharged from ED. 
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
## 2019-09-22 ENCOUNTER — Ambulatory Visit: Payer: Medicare Other | Admitting: Orthopaedic Surgery

## 2019-09-29 ENCOUNTER — Other Ambulatory Visit: Payer: Self-pay | Admitting: Internal Medicine

## 2019-09-29 DIAGNOSIS — E119 Type 2 diabetes mellitus without complications: Secondary | ICD-10-CM

## 2019-10-04 ENCOUNTER — Other Ambulatory Visit: Payer: Self-pay | Admitting: Pain Medicine

## 2019-10-04 ENCOUNTER — Other Ambulatory Visit (HOSPITAL_COMMUNITY): Payer: Self-pay | Admitting: Pain Medicine

## 2019-10-04 DIAGNOSIS — R10817 Generalized abdominal tenderness: Secondary | ICD-10-CM

## 2019-10-05 ENCOUNTER — Ambulatory Visit: Payer: Medicare Other | Admitting: Orthopaedic Surgery

## 2019-10-06 ENCOUNTER — Encounter (HOSPITAL_COMMUNITY): Payer: Self-pay | Admitting: Emergency Medicine

## 2019-10-06 ENCOUNTER — Emergency Department (HOSPITAL_COMMUNITY)
Admission: EM | Admit: 2019-10-06 | Discharge: 2019-10-07 | Disposition: A | Discharge status: Home / Self Care | Payer: Medicare Other | Attending: Emergency Medicine | Admitting: Emergency Medicine

## 2019-10-06 ENCOUNTER — Emergency Department (HOSPITAL_COMMUNITY): Payer: Medicare Other

## 2019-10-06 ENCOUNTER — Other Ambulatory Visit: Payer: Self-pay

## 2019-10-06 DIAGNOSIS — N3000 Acute cystitis without hematuria: Secondary | ICD-10-CM | POA: Diagnosis not present

## 2019-10-06 DIAGNOSIS — S299XXA Unspecified injury of thorax, initial encounter: Secondary | ICD-10-CM | POA: Diagnosis not present

## 2019-10-06 DIAGNOSIS — R2990 Unspecified symptoms and signs involving the nervous system: Secondary | ICD-10-CM | POA: Diagnosis not present

## 2019-10-06 DIAGNOSIS — M6281 Muscle weakness (generalized): Secondary | ICD-10-CM | POA: Diagnosis not present

## 2019-10-06 DIAGNOSIS — Z79899 Other long term (current) drug therapy: Secondary | ICD-10-CM | POA: Diagnosis not present

## 2019-10-06 DIAGNOSIS — R42 Dizziness and giddiness: Secondary | ICD-10-CM | POA: Diagnosis not present

## 2019-10-06 DIAGNOSIS — M50321 Other cervical disc degeneration at C4-C5 level: Secondary | ICD-10-CM | POA: Diagnosis not present

## 2019-10-06 DIAGNOSIS — M50222 Other cervical disc displacement at C5-C6 level: Secondary | ICD-10-CM | POA: Diagnosis not present

## 2019-10-06 DIAGNOSIS — M50221 Other cervical disc displacement at C4-C5 level: Secondary | ICD-10-CM | POA: Diagnosis not present

## 2019-10-06 DIAGNOSIS — R55 Syncope and collapse: Secondary | ICD-10-CM | POA: Diagnosis not present

## 2019-10-06 DIAGNOSIS — G894 Chronic pain syndrome: Secondary | ICD-10-CM | POA: Diagnosis not present

## 2019-10-06 DIAGNOSIS — M50223 Other cervical disc displacement at C6-C7 level: Secondary | ICD-10-CM | POA: Diagnosis not present

## 2019-10-06 DIAGNOSIS — K5900 Constipation, unspecified: Secondary | ICD-10-CM | POA: Diagnosis not present

## 2019-10-06 DIAGNOSIS — E119 Type 2 diabetes mellitus without complications: Secondary | ICD-10-CM | POA: Insufficient documentation

## 2019-10-06 DIAGNOSIS — Z7984 Long term (current) use of oral hypoglycemic drugs: Secondary | ICD-10-CM | POA: Insufficient documentation

## 2019-10-06 DIAGNOSIS — R0789 Other chest pain: Secondary | ICD-10-CM | POA: Diagnosis not present

## 2019-10-06 DIAGNOSIS — R29818 Other symptoms and signs involving the nervous system: Secondary | ICD-10-CM | POA: Diagnosis not present

## 2019-10-06 DIAGNOSIS — I10 Essential (primary) hypertension: Secondary | ICD-10-CM | POA: Insufficient documentation

## 2019-10-06 DIAGNOSIS — Z91040 Latex allergy status: Secondary | ICD-10-CM | POA: Diagnosis not present

## 2019-10-06 DIAGNOSIS — M25511 Pain in right shoulder: Secondary | ICD-10-CM | POA: Diagnosis not present

## 2019-10-06 DIAGNOSIS — M549 Dorsalgia, unspecified: Secondary | ICD-10-CM | POA: Diagnosis not present

## 2019-10-06 DIAGNOSIS — S4991XA Unspecified injury of right shoulder and upper arm, initial encounter: Secondary | ICD-10-CM | POA: Diagnosis not present

## 2019-10-06 LAB — RAPID URINE DRUG SCREEN, HOSP PERFORMED
Amphetamines: NOT DETECTED
Barbiturates: NOT DETECTED
Benzodiazepines: POSITIVE — AB
Cocaine: NOT DETECTED
Opiates: POSITIVE — AB
Tetrahydrocannabinol: NOT DETECTED

## 2019-10-06 LAB — CBC
HCT: 42 % (ref 36.0–46.0)
HCT: 39.5 % (ref 36.0–46.0)
Hemoglobin: 13.5 g/dL (ref 12.0–15.0)
Hemoglobin: 13 g/dL (ref 12.0–15.0)
MCH: 27.5 pg (ref 26.0–34.0)
MCH: 27.5 pg (ref 26.0–34.0)
MCHC: 32.1 g/dL (ref 30.0–36.0)
MCHC: 32.9 g/dL (ref 30.0–36.0)
MCV: 85.5 fL (ref 80.0–100.0)
MCV: 83.5 fL (ref 80.0–100.0)
Platelets: 367 10*3/uL (ref 150–400)
Platelets: 332 10*3/uL (ref 150–400)
RBC: 4.91 MIL/uL (ref 3.87–5.11)
RBC: 4.73 MIL/uL (ref 3.87–5.11)
RDW: 14.1 % (ref 11.5–15.5)
RDW: 13.9 % (ref 11.5–15.5)
WBC: 6.3 10*3/uL (ref 4.0–10.5)
WBC: 7.2 10*3/uL (ref 4.0–10.5)
nRBC: 0 % (ref 0.0–0.2)
nRBC: 0 % (ref 0.0–0.2)

## 2019-10-06 LAB — DIFFERENTIAL
Abs Immature Granulocytes: 0.02 10*3/uL (ref 0.00–0.07)
Basophils Absolute: 0 10*3/uL (ref 0.0–0.1)
Basophils Relative: 1 %
Eosinophils Absolute: 0.2 10*3/uL (ref 0.0–0.5)
Eosinophils Relative: 3 %
Immature Granulocytes: 0 %
Lymphocytes Relative: 49 %
Lymphs Abs: 3.5 10*3/uL (ref 0.7–4.0)
Monocytes Absolute: 0.4 10*3/uL (ref 0.1–1.0)
Monocytes Relative: 5 %
Neutro Abs: 3.1 10*3/uL (ref 1.7–7.7)
Neutrophils Relative %: 42 %

## 2019-10-06 LAB — I-STAT CHEM 8, ED
BUN: 11 mg/dL (ref 6–20)
Calcium, Ion: 1.02 mmol/L — ABNORMAL LOW (ref 1.15–1.40)
Chloride: 101 mmol/L (ref 98–111)
Creatinine, Ser: 0.8 mg/dL (ref 0.44–1.00)
Glucose, Bld: 94 mg/dL (ref 70–99)
HCT: 40 % (ref 36.0–46.0)
Hemoglobin: 13.6 g/dL (ref 12.0–15.0)
Potassium: 3.5 mmol/L (ref 3.5–5.1)
Sodium: 138 mmol/L (ref 135–145)
TCO2: 29 mmol/L (ref 22–32)

## 2019-10-06 LAB — URINALYSIS, ROUTINE W REFLEX MICROSCOPIC
Bilirubin Urine: NEGATIVE
Glucose, UA: NEGATIVE mg/dL
Hgb urine dipstick: NEGATIVE
Ketones, ur: NEGATIVE mg/dL
Nitrite: NEGATIVE
Protein, ur: NEGATIVE mg/dL
Specific Gravity, Urine: 1.019 (ref 1.005–1.030)
pH: 7 (ref 5.0–8.0)

## 2019-10-06 LAB — CBG MONITORING, ED: Glucose-Capillary: 144 mg/dL — ABNORMAL HIGH (ref 70–99)

## 2019-10-06 LAB — BASIC METABOLIC PANEL
Anion gap: 13 (ref 5–15)
BUN: 11 mg/dL (ref 6–20)
CO2: 26 mmol/L (ref 22–32)
Calcium: 9.5 mg/dL (ref 8.9–10.3)
Chloride: 99 mmol/L (ref 98–111)
Creatinine, Ser: 0.9 mg/dL (ref 0.44–1.00)
GFR calc Af Amer: >60 mL/min (ref >60)
GFR calc non Af Amer: >60 mL/min (ref >60)
Glucose, Bld: 161 mg/dL — ABNORMAL HIGH (ref 70–99)
Potassium: 3.5 mmol/L (ref 3.5–5.1)
Sodium: 138 mmol/L (ref 135–145)

## 2019-10-06 LAB — ETHANOL: Alcohol, Ethyl (B): <10 mg/dL (ref <10)

## 2019-10-06 MED ORDER — HYDROCHLOROTHIAZIDE 25 MG PO TABS
12.5000 mg | ORAL_TABLET | Freq: Every day | ORAL | Status: DC
  Administered 2019-10-07: 12.5 mg via ORAL
  Filled 2019-10-06: qty 1

## 2019-10-06 MED ORDER — CARIPRAZINE HCL 1.5 MG PO CAPS
6.0000 mg | ORAL_CAPSULE | Freq: Every day | ORAL | Status: DC
  Administered 2019-10-07: 6 mg via ORAL
  Filled 2019-10-06: qty 4

## 2019-10-06 MED ORDER — CEPHALEXIN 250 MG PO CAPS
500.0000 mg | ORAL_CAPSULE | Freq: Two times a day (BID) | ORAL | Status: DC
  Administered 2019-10-06 – 2019-10-07 (×2): 500 mg via ORAL
  Filled 2019-10-06 (×2): qty 2

## 2019-10-06 MED ORDER — PANTOPRAZOLE SODIUM 40 MG PO TBEC
40.0000 mg | DELAYED_RELEASE_TABLET | Freq: Every day | ORAL | Status: DC
  Administered 2019-10-07: 40 mg via ORAL
  Filled 2019-10-06: qty 1

## 2019-10-06 MED ORDER — DOCUSATE SODIUM 50 MG PO CAPS
50.0000 mg | ORAL_CAPSULE | Freq: Every day | ORAL | Status: DC
  Administered 2019-10-07: 50 mg via ORAL
  Filled 2019-10-06: qty 1

## 2019-10-06 MED ORDER — NALOXONE HCL 4 MG/0.1ML NA LIQD: 1.0000 | NASAL | Status: DC

## 2019-10-06 MED ORDER — HYDROXYZINE HCL 25 MG PO TABS
25.0000 mg | ORAL_TABLET | Freq: Three times a day (TID) | ORAL | Status: DC
  Administered 2019-10-07 (×3): 25 mg via ORAL
  Filled 2019-10-06 (×3): qty 1

## 2019-10-06 MED ORDER — VORTIOXETINE HBR 5 MG PO TABS
10.0000 mg | ORAL_TABLET | Freq: Every day | ORAL | Status: DC
  Administered 2019-10-07: 10 mg via ORAL
  Filled 2019-10-06 (×2): qty 2

## 2019-10-06 MED ORDER — LUMATEPERONE TOSYLATE 42 MG PO CAPS: 42.0000 mg | ORAL_CAPSULE | Freq: Every day | ORAL | Status: DC

## 2019-10-06 MED ORDER — IOHEXOL 350 MG/ML SOLN
60.0000 mL | Freq: Once | INTRAVENOUS | Status: AC | PRN
  Administered 2019-10-06: 60 mL via INTRAVENOUS

## 2019-10-06 MED ORDER — HYDROMORPHONE HCL 2 MG PO TABS
4.0000 mg | ORAL_TABLET | Freq: Four times a day (QID) | ORAL | Status: DC | PRN
  Administered 2019-10-07 (×3): 4 mg via ORAL
  Filled 2019-10-06 (×3): qty 2

## 2019-10-06 MED ORDER — TRAZODONE HCL 50 MG PO TABS
100.0000 mg | ORAL_TABLET | Freq: Every day | ORAL | Status: DC
  Administered 2019-10-07: 200 mg via ORAL
  Filled 2019-10-06: qty 4

## 2019-10-06 MED ORDER — NORGESTIM-ETH ESTRAD TRIPHASIC 0.18/0.215/0.25 MG-35 MCG PO TABS: 1.0000 | ORAL_TABLET | Freq: Every day | ORAL | Status: DC

## 2019-10-06 MED ORDER — LUBIPROSTONE 24 MCG PO CAPS
24.0000 ug | ORAL_CAPSULE | Freq: Two times a day (BID) | ORAL | Status: DC
  Administered 2019-10-07: 24 ug via ORAL
  Filled 2019-10-06 (×3): qty 1

## 2019-10-06 MED ORDER — LOSARTAN POTASSIUM 50 MG PO TABS
25.0000 mg | ORAL_TABLET | Freq: Every day | ORAL | Status: DC
  Administered 2019-10-07: 25 mg via ORAL
  Filled 2019-10-06: qty 1

## 2019-10-06 MED ORDER — METFORMIN HCL 500 MG PO TABS
500.0000 mg | ORAL_TABLET | Freq: Two times a day (BID) | ORAL | Status: DC
  Administered 2019-10-07 (×2): 500 mg via ORAL
  Filled 2019-10-06 (×2): qty 1

## 2019-10-06 MED ORDER — HYDROMORPHONE HCL 1 MG/ML IJ SOLN
1.0000 mg | Freq: Once | INTRAMUSCULAR | Status: AC
  Administered 2019-10-06: 1 mg via INTRAVENOUS
  Filled 2019-10-06: qty 1

## 2019-10-06 MED ORDER — DICLOFENAC SODIUM 1 % EX GEL: 4.0000 g | Freq: Four times a day (QID) | CUTANEOUS | Status: DC | PRN

## 2019-10-06 MED ORDER — BUDESONIDE 0.5 MG/2ML IN SUSP
0.5000 mg | Freq: Two times a day (BID) | RESPIRATORY_TRACT | Status: DC
  Filled 2019-10-06: qty 2

## 2019-10-06 MED ORDER — DIVALPROEX SODIUM ER 500 MG PO TB24
1000.0000 mg | ORAL_TABLET | Freq: Every day | ORAL | Status: DC
  Filled 2019-10-06: qty 2

## 2019-10-06 MED ORDER — PROMETHAZINE HCL 25 MG PO TABS
25.0000 mg | ORAL_TABLET | Freq: Four times a day (QID) | ORAL | Status: DC | PRN
  Administered 2019-10-07 (×2): 25 mg via ORAL
  Filled 2019-10-06 (×2): qty 1

## 2019-10-06 MED ORDER — CALCIUM GLUCONATE 500 MG PO TABS
500.0000 mg | ORAL_TABLET | Freq: Every day | ORAL | Status: DC
  Filled 2019-10-06 (×2): qty 1

## 2019-10-06 MED ORDER — PROMETHAZINE HCL 25 MG/ML IJ SOLN
25.0000 mg | Freq: Once | INTRAMUSCULAR | Status: AC
  Administered 2019-10-06: 25 mg via INTRAVENOUS
  Filled 2019-10-06: qty 1

## 2019-10-06 MED ORDER — ATENOLOL 50 MG PO TABS
100.0000 mg | ORAL_TABLET | Freq: Every day | ORAL | Status: DC
  Administered 2019-10-07: 100 mg via ORAL
  Filled 2019-10-06: qty 4

## 2019-10-06 MED ORDER — CEPHALEXIN 500 MG PO CAPS: 500.0000 mg | ORAL_CAPSULE | Freq: Three times a day (TID) | ORAL | 0 refills | Status: DC

## 2019-10-06 MED ORDER — ATORVASTATIN CALCIUM 10 MG PO TABS: 10.0000 mg | ORAL_TABLET | Freq: Every day | ORAL | Status: DC

## 2019-10-06 MED ORDER — ONDANSETRON HCL 4 MG/2ML IJ SOLN
4.0000 mg | Freq: Once | INTRAMUSCULAR | Status: AC
  Administered 2019-10-06: 4 mg via INTRAVENOUS
  Filled 2019-10-06: qty 2

## 2019-10-06 MED ORDER — IOHEXOL 300 MG/ML  SOLN
80.0000 mL | Freq: Once | INTRAMUSCULAR | Status: AC | PRN
  Administered 2019-10-06: 80 mL via INTRAVENOUS

## 2019-10-06 MED ORDER — POLYETHYLENE GLYCOL 3350 17 G PO PACK
17.0000 g | PACK | Freq: Every day | ORAL | Status: DC
  Filled 2019-10-06: qty 1

## 2019-10-06 MED ORDER — QUETIAPINE FUMARATE ER 400 MG PO TB24
400.0000 mg | ORAL_TABLET | Freq: Two times a day (BID) | ORAL | Status: DC
  Administered 2019-10-07 (×2): 400 mg via ORAL
  Filled 2019-10-06 (×3): qty 1

## 2019-10-06 MED ORDER — ALBUTEROL SULFATE HFA 108 (90 BASE) MCG/ACT IN AERS: 2.0000 | INHALATION_SPRAY | Freq: Four times a day (QID) | RESPIRATORY_TRACT | Status: DC | PRN

## 2019-10-06 MED ORDER — SODIUM CHLORIDE 0.9% FLUSH
3.0000 mL | Freq: Once | INTRAVENOUS | Status: AC
  Administered 2019-10-06: 3 mL via INTRAVENOUS

## 2019-10-06 MED ORDER — TIZANIDINE HCL 4 MG PO TABS
4.0000 mg | ORAL_TABLET | ORAL | Status: DC
  Administered 2019-10-07 (×4): 4 mg via ORAL
  Filled 2019-10-06 (×4): qty 1

## 2019-10-06 NOTE — Care Management (Signed)
Spoke with daughter concerning HH recommendation, daughter is agreeable, offered choice no preference, CM will forward referral to to [facility]

## 2019-10-06 NOTE — Consult Note (Signed)
Neurology Consultation  Reason for Consult: Code stroke for dizziness and right-sided weakness Referring Physician: Dr. Sedonia Small  CC: Lightheadedness, fall, right-sided weakness  History is obtained from: Patient, chart  HPI: Linda Mercado is a 54 y.o. female past medical history of diabetes, chronic pain with a fentanyl pump, bradycardia, hypertension, presenting to the emergency room for evaluation of an episode of dizziness/syncopal event. She says she was going to the bathroom felt dizzy-lightheaded and had a fall.  Recollects the whole event. She is on a intrathecal pain pump with fentanyl.  It was refilled last week.  Family reports feeling lethargic and weak always after pain medicine refills.  She reports some generalized weakness.  On the ED providers exam, she had some right-sided weakness for which the code stroke was activated for concerns for posterior circulation stroke. On my examination, the exam was essentially nonfocal with a lot of effort dependent weakness in all 4 extremities. No visual changes.  No headaches.  Reports tingling on the right side not numbness.  Does not report any dizziness ongoing at this time.  No fevers chills.  No chest pain.  No nausea vomiting at this time but was nauseous before.   LKW: Sometime before 1 PM on 10/06/2019-could not give me an exact last normal time tpa given?: no, outside the window by the time neurology was notified Premorbid modified Rankin scale (mRS):2  ROS: ROS was performed and is negative except as noted in the HPI.    Past Medical History:  Diagnosis Date  . Acid reflux   . Bradycardia   . Chronic back pain   . Diabetes mellitus without complication ([place])   . Hypertension   . Hypokalemia     Family History  Problem Relation Age of Onset  . Diabetes Father   . Hypertension Father   . Kidney disease Mother   . Hypertension Mother   . Bipolar disorder Sister   . Schizophrenia Sister   . Neuropathy Sister   .  Hypertension Sister   . Bipolar disorder Brother   . Bipolar disorder Brother   . Schizophrenia Brother   . Bipolar disorder Daughter   . Post-traumatic stress disorder Daughter   . Asthma Daughter   . Depression Daughter   . Bipolar disorder Daughter   . Post-traumatic stress disorder Daughter   . Asthma Daughter   . Depression Daughter   . Asthma Daughter   . Depression Daughter   . Asthma Daughter   . Stroke Neg Hx   . Cancer Neg Hx   . CAD Neg Hx    Social History:   reports that she has never smoked. She has never used smokeless tobacco. She reports previous alcohol use. She reports that she does not use drugs.  Medications No current facility-administered medications for this encounter.  Current Outpatient Medications:  .  albuterol (VENTOLIN HFA) 108 (90 Base) MCG/ACT inhaler, Inhale 2 puffs into the lungs every 6 (six) hours as needed for wheezing or shortness of breath., Disp: 3 g, Rfl: 1 .  AMITIZA 24 MCG capsule, Take 24 mcg by mouth 2 (two) times daily. , Disp: , Rfl:  .  atenolol (TENORMIN) 100 MG tablet, Take 1 tablet by mouth daily. (Patient taking differently: Take 100 mg by mouth daily. ), Disp: 30 tablet, Rfl: 0 .  atorvastatin (LIPITOR) 10 MG tablet, Take 1 tablet (10 mg total) by mouth daily. (Patient taking differently: Take 10 mg by mouth at bedtime. ), Disp: 90 tablet, Rfl:  0 .  calcium gluconate 500 MG tablet, Take one Monday through Friday only. (Patient taking differently: Take 500 mg by mouth daily. ), Disp: 90 tablet, Rfl: 0 .  CAPLYTA 42 MG CAPS, Take 42 mg by mouth at bedtime. , Disp: , Rfl:  .  clonazePAM (KLONOPIN) 1 MG tablet, Take 1 mg by mouth 3 (three) times daily., Disp: , Rfl:  .  Cyanocobalamin (B-12) 500 MCG SUBL, One SL QD (Patient taking differently: Take 500 mcg by mouth daily. ), Disp: 90 tablet, Rfl: 1 .  DEXILANT 60 MG capsule, Take 1 capsule by mouth daily. (Patient taking differently: Take 60 mg by mouth daily. ), Disp: 90 capsule,  Rfl: 0 .  diclofenac Sodium (VOLTAREN) 1 % GEL, Apply 4 grams topically four times daily. (Patient taking differently: Apply 4 g topically 4 (four) times daily as needed (topical pain .). ), Disp: 400 g, Rfl: 0 .  divalproex (DEPAKOTE ER) 500 MG 24 hr tablet, Take 1,000 mg by mouth daily. , Disp: , Rfl: 1 .  docusate sodium (COLACE) 50 MG capsule, Take 50 mg by mouth daily. , Disp: , Rfl:  .  ELDERBERRY PO, Take 1 tablet by mouth at bedtime., Disp: , Rfl:  .  fluticasone (FLOVENT HFA) 110 MCG/ACT inhaler, Inhale 2 puffs into the lungs every 12 (twelve) hours., Disp: , Rfl:  .  FREESTYLE LITE test strip, CHECK BLOOD SUGAR 3 TO 4 TIMES DAILY (Patient taking differently: 1 each by Other route once a week. ), Disp: 150 each, Rfl: 11 .  hydrochlorothiazide (HYDRODIURIL) 12.5 MG tablet, Take 1 tablet by mouth once daily. (Patient taking differently: Take 12.5 mg by mouth daily. ), Disp: 90 tablet, Rfl: 0 .  HYDROmorphone (DILAUDID) 4 MG tablet, Take 4 mg by mouth 4 (four) times daily as needed for severe pain., Disp: , Rfl:  .  hydrOXYzine (ATARAX/VISTARIL) 25 MG tablet, Take 25 mg by mouth in the morning, at noon, in the evening, and at bedtime. , Disp: , Rfl:  .  lidocaine (XYLOCAINE) 2 % solution, Use as directed 15 mLs in the mouth or throat 3 (three) times a week. , Disp: , Rfl:  .  Lidocaine 4 % PTCH, Place 1 patch onto the skin in the morning and at bedtime., Disp: , Rfl:  .  losartan (COZAAR) 25 MG tablet, Take 1 tablet by mouth daily. (Patient taking differently: Take 25 mg by mouth daily. ), Disp: 90 tablet, Rfl: 0 .  metFORMIN (GLUCOPHAGE) 500 MG tablet, Take 1 tablet by mouth twice daily with meals., Disp: 180 tablet, Rfl: 0 .  Multiple Vitamin (MULTIVITAMIN WITH MINERALS) TABS tablet, Take 1 tablet by mouth daily. , Disp: , Rfl:  .  NARCAN 4 MG/0.1ML LIQD nasal spray kit, Place 1 spray into the nose as directed., Disp: , Rfl: 0 .  Norgestimate-Ethinyl Estradiol Triphasic (TRI-ESTARYLLA)  0.18/0.215/0.25 MG-35 MCG tablet, Take 1 tablet by mouth daily. (Patient taking differently: Take 1 tablet by mouth at bedtime. ), Disp: 3 Package, Rfl: 3 .  ondansetron (ZOFRAN) 4 MG tablet, Take 4 mg by mouth as needed for nausea. , Disp: , Rfl:  .  PAIN MANAGEMENT INTRATHECAL, IT, PUMP, 1 each by Intrathecal route continuous. Intrathecal (IT) medication: fentanyl, clonidine, bupivicaine  Fentanyl 237m/bupivicaine150mg/clonidine79mg  51.974mper day, 79m25mer day, 1.56m18m5.4ml/58mn, Disp: , Rfl:  .  polyethylene glycol powder (GLYCOLAX/MIRALAX) 17 GM/SCOOP powder, Mix 17 grams (1 capful) with 8 ounces of fluid and take by mouth daily for  bowels. (Patient taking differently: Take 0.5 Containers by mouth daily. ), Disp: 255 g, Rfl: 0 .  predniSONE (STERAPRED UNI-PAK 21 TAB) 10 MG (21) TBPK tablet, Take 6 tabs for 2 days, then 5 for 2 days, then 4 for 2 days, then 3 for 2 days, 2 for 2 days, then 1 for 2 days (Patient taking differently: Take 10 mg by mouth See admin instructions. Take 6 tabs for 2 days, then 5 for 2 days, then 4 for 2 days, then 3 for 2 days, 2 for 2 days, then 1 for 2 days), Disp: 42 tablet, Rfl: 0 .  promethazine (PHENERGAN) 25 MG tablet, Take 1 tablet (25 mg total) by mouth every 6 (six) hours as needed for nausea or vomiting., Disp: 90 tablet, Rfl: 0 .  QUEtiapine (SEROQUEL XR) 400 MG 24 hr tablet, Take 400 mg by mouth 2 (two) times daily., Disp: , Rfl:  .  QUEtiapine (SEROQUEL) 300 MG tablet, Take 300 mg by mouth at bedtime., Disp: , Rfl:  .  tiZANidine (ZANAFLEX) 4 MG tablet, Take 4 mg by mouth every 4 (four) hours., Disp: , Rfl:  .  traZODone (DESYREL) 100 MG tablet, 5 qhs prescribed by psychiatry (Patient taking differently: Take 100-200 mg by mouth at bedtime. 5 qhs prescribed by psychiatry), Disp: 30 tablet, Rfl: 0 .  triamcinolone cream (KENALOG) 0.1 %, Apply 1 application topically daily as needed (for itching)., Disp: 30 g, Rfl: 2 .  vitamin C (ASCORBIC ACID) 250 MG tablet,  Take 250 mg by mouth at bedtime., Disp: , Rfl:  .  Vitamin D, Ergocalciferol, (DRISDOL) 1.25 MG (50000 UNIT) CAPS capsule, Take 1 capsule by mouth twice weekly with a fatty meal. (Patient taking differently: Take 50,000 Units by mouth 2 (two) times a week. Sunday and Thursday), Disp: 24 capsule, Rfl: 0 .  vortioxetine HBr (TRINTELLIX) 10 MG TABS tablet, Take 10 mg by mouth at bedtime., Disp: , Rfl:  .  VRAYLAR 6 MG CAPS, Take 6 mg by mouth daily. , Disp: , Rfl:   Exam: Current vital signs: BP 130/74   Pulse (!) 57   Temp 98.4 F (36.9 C) (Oral)   Resp 11   SpO2 97%  Vital signs in last 24 hours: Temp:  [98.4 F (36.9 C)] 98.4 F (36.9 C) (03/17 1408) Pulse Rate:  [57-68] 57 (03/17 1715) Resp:  [11-14] 11 (03/17 1715) BP: (130-131)/(74-83) 130/74 (03/17 1715) SpO2:  [97 %-100 %] 97 % (03/17 1715) General: Awake alert in no distress HEENT: Normocephalic atraumatic Cardiovascular: Regular rhythm Respiratory: Clear to auscultation Extremities warm well perfused and tender to touch all over Neurological exam Awake alert oriented x3 Speech is hypophonic-no evidence of dysarthria or aphasia. Follows all commands Cranial nerves: Pupils equal round react light, extraocular movements intact, visual fields full, facial sensation intact to light touch, facial symmetry preserved, tongue and palate midline. Motor exam: She is antigravity without drift in all 4 extremities with coaching.  Her exam is limited a lot by her pain.  Extremely slow to respond to commands because of pain. Sensory exam: She reports feeling tingly on the right side of the body but no numbness. Coordination: Difficult to assess due to slowness of motion due to pain. Gait testing was deferred for safety NIH stroke scale-1 for subjective sensory   Labs I have reviewed labs in epic and the results pertinent to this consultation are: Unremarkable CBC and CMP  CBC    Component Value Date/Time   WBC 7.2 10/06/2019  1718    RBC 4.73 10/06/2019 1718   HGB 13.6 10/06/2019 1801   HGB 13.7 02/18/2019 1247   HCT 40.0 10/06/2019 1801   HCT 42.7 02/18/2019 1247   PLT 332 10/06/2019 1718   PLT 235 02/18/2019 1247   MCV 83.5 10/06/2019 1718   MCV 86 02/18/2019 1247   MCH 27.5 10/06/2019 1718   MCHC 32.9 10/06/2019 1718   RDW 13.9 10/06/2019 1718   RDW 14.3 02/18/2019 1247   LYMPHSABS 3.5 10/06/2019 1718   MONOABS 0.4 10/06/2019 1718   EOSABS 0.2 10/06/2019 1718   BASOSABS 0.0 10/06/2019 1718    CMP     Component Value Date/Time   NA 138 10/06/2019 1801   NA 141 09/17/2018 1258   K 3.5 10/06/2019 1801   CL 101 10/06/2019 1801   CO2 26 10/06/2019 1411   GLUCOSE 94 10/06/2019 1801   BUN 11 10/06/2019 1801   BUN 13 09/17/2018 1258   CREATININE 0.80 10/06/2019 1801   CALCIUM 9.5 10/06/2019 1411   PROT 7.7 09/17/2018 1258   ALBUMIN 4.6 09/17/2018 1258   AST 9 09/17/2018 1258   ALT 9 09/17/2018 1258   ALKPHOS 110 09/17/2018 1258   BILITOT <0.2 09/17/2018 1258   GFRNONAA >60 10/06/2019 1411   GFRAA >60 10/06/2019 1411    Imaging I have reviewed the images obtained:  CT-scan of the brain-unremarkable-no bleed, aspects 10 CTA head and neck-no emergent LVO.  No significant stenosis.  Assessment: 54 year old presenting for evaluation of syncopal episode with lightheadedness and dizziness.  Given her history of hypertension and bradycardia, I would look for cardiac cause for these.  Her exam neurologically is nonfocal and there is a component of embellishment with pain. Imaging not suggestive of any vertebrobasilar insufficiency. She is on multiple sedating medications including intrathecal pain medications, muscle relaxants and sleep aids which could contribute to the syncopal episode/fall due to the side effect of these medications-I would recommend the primary team optimize dose for her.   Impression: Lightheadedness dizziness-likely polypharmacy versus cardiogenic syncope Unlikely to be  vertebrobasilar insufficiency  Recommendations: Cardiac monitoring. Syncope work-up per primary team if desired-can be done outpatient. No further brain imaging needed at this point. Minimize sedating medications as much as possible Neurology will be available as needed Plan discussed with Dr. Gerlene Fee, in the ER  -- Amie Portland, MD [facility] Pager: [phone] If 7pm to 7am, please call on call as listed on [facility].   CRITICAL CARE ATTESTATION Performed by: Amie Portland, MD Total critical care time: 40 minutes Critical care time was exclusive of separately billable procedures and treating other patients and/or supervising APPs/Residents/Students Critical care was necessary to treat or prevent imminent or life-threatening deterioration due to strokelike symptoms, polypharmacy, evaluation for strokelike symptoms This patient is critically ill and at significant risk for neurological worsening and/or death and care requires constant monitoring. Critical care was time spent personally by me on the following activities: development of treatment plan with patient and/or surrogate as well as nursing, discussions with consultants, evaluation of patient's response to treatment, examination of patient, obtaining history from patient or surrogate, ordering and performing treatments and interventions, ordering and review of laboratory studies, ordering and review of radiographic studies, pulse oximetry, re-evaluation of patient's condition, participation in multidisciplinary rounds and medical decision making of high complexity in the care of this patient.

## 2019-10-06 NOTE — ED Notes (Signed)
Upon rounding on the patient, the c-collar is not longer in place. Pts states "I took it off myself I was miserable with it on" RN educated the patient on the importance of keeping the collar in place. C-collar placed.

## 2019-10-06 NOTE — ED Notes (Signed)
Doug ([facility]/Activate Code Stroke)called @ 1749-per Dr.Bero called by Levada Dy

## 2019-10-06 NOTE — ED Provider Notes (Signed)
[facility] EMERGENCY DEPARTMENT Provider Note   CSN: [id number] Arrival date & time: 10/06/19  1347     History Chief Complaint  Patient presents with  . Syncopal episode    Linda Mercado is a 54 y.o. female past medical history significant for hypertension, diabetes, bradycardia, chronic back pain presents to emergency department today with chief complaint of syncopal episode that happened just prior to arrival.  Patient is accompanied by her daughter who is contributing historian.  Patient states she was walking upstairs when she felt lightheaded and as if the room was spinning.  She fell hitting her head on the ground.  Her daughter was downstairs and heard the commotion and went to her mother immediately.  She states her mother was alert and oriented crying out in pain.  Does not appear there was loss of consciousness.  Patient is reporting pain on her right side including neck, right shoulder, right ribs.  She describes the pain as sharp sensation.  She rates the pain 10 out of 10 in severity.  Patient denies headache, visual changes, chest pain, shortness of breath, nausea, vomiting, urinary symptoms, diarrhea, blood in stool. Patient is not anticoagulated. She reports history of falls and ambulates with walker at baseline.  Chart review shows patient was seen by her pain management doctor on 3/9 and 10/01/2019.  At that time she had pain everywhere that she described as burning, electric, numbing, aching, sharp, shooting, stinging, constant.  She has an IT pump that was placed in 2017 in [place].  She had pump refilled and reprogrammed on 09/28/2019.  There was concern for possible abdominal infection given her tenderness around pump site.  Outpatient CT scan was ordered but not yet completed.     Past Medical History:  Diagnosis Date  . Acid reflux   . Bradycardia   . Chronic back pain   . Diabetes mellitus without complication ([place])   . Hypertension   .  Hypokalemia     Patient Active Problem List   Diagnosis Date Noted  . Pain in left knee 05/06/2019  . Hashimoto's disease 04/05/2019  . Nausea 11/23/2018  . Frequent falls 09/17/2018  . Skin lesion 09/17/2018  . GERD (gastroesophageal reflux disease)   . Chronic midline low back pain with sciatica 05/27/2018  . Intractable pain 03/20/2018  . Bradycardia   . Chronic pain syndrome 03/18/2018  . 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 Hypokalemia 03/18/2018  . Intractable back pain 01/03/2018  . HTN (hypertension) 01/03/2018  . Type 2 diabetes mellitus without complication ([place]) 23/55/7322  . Vitamin D deficiency 07/11/2017  . Postmenopausal 04/24/2017  . Microalbuminuria 01/14/2017  . Dyspareunia in female 12/10/2016  . Endometriosis determined by laparoscopy 12/10/2016  . History of exploratory laparotomy 12/10/2016  . Pelvic pain 12/10/2016  . Bipolar 1 disorder ([place]) 09/19/2016  . Cyst of right ovary 09/19/2016  . 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 Schizoaffective disorder, bipolar type ([place]) 09/19/2016  . Chronic pain 03/05/2016  . Non morbid obesity due to excess calories 03/05/2016  . S/P lumbar fusion 03/05/2016    Past Surgical History:  Procedure Laterality Date  . APPENDECTOMY    . BACK SURGERY     x6  . CESAREAN SECTION     x3  . ESOPHAGEAL MANOMETRY N/A 08/25/2019   Procedure: ESOPHAGEAL MANOMETRY (EM);  Surgeon: Carol Ada, MD;  Location: [facility];  Service: Endoscopy;  Laterality: N/A;  .  HERNIA REPAIR       OB History   No obstetric history on file.     Family History  Problem Relation Age of Onset  . Diabetes Father   . Hypertension Father   . Kidney disease Mother   . Hypertension Mother   . Bipolar disorder Sister   . Schizophrenia Sister   . Neuropathy Sister   . Hypertension Sister   . Bipolar disorder Brother   . Bipolar disorder Brother   . Schizophrenia Brother    . Bipolar disorder Daughter   . Post-traumatic stress disorder Daughter   . Asthma Daughter   . Depression Daughter   . Bipolar disorder Daughter   . Post-traumatic stress disorder Daughter   . Asthma Daughter   . Depression Daughter   . Asthma Daughter   . Depression Daughter   . Asthma Daughter   . Stroke Neg Hx   . Cancer Neg Hx   . CAD Neg Hx     Social History   Tobacco Use  . Smoking status: Never Smoker  . Smokeless tobacco: Never Used  Substance Use Topics  . Alcohol use: Not Currently  . Drug use: Never    Home Medications Prior to Admission medications   Medication Sig Start Date End Date Taking? Authorizing Provider  albuterol (VENTOLIN HFA) 108 (90 Base) MCG/ACT inhaler Inhale 2 puffs into the lungs every 6 (six) hours as needed for wheezing or shortness of breath. 02/18/19  Yes Rodriguez-Southworth, [name], PA-C  AMITIZA 24 MCG capsule Take 24 mcg by mouth 2 (two) times daily.  10/23/18  Yes [provider]  atenolol (TENORMIN) 100 MG tablet Take 1 tablet by mouth daily. Patient taking differently: Take 100 mg by mouth daily.  04/01/19  Yes Rodriguez-Southworth, [name], PA-C  atorvastatin (LIPITOR) 10 MG tablet Take 1 tablet (10 mg total) by mouth daily. Patient taking differently: Take 10 mg by mouth at bedtime.  08/10/19 08/09/20 Yes Rodriguez-Southworth, [name], PA-C  calcium gluconate 500 MG tablet Take one Monday through Friday only. Patient taking differently: Take 500 mg by mouth daily.  10/08/18  Yes Rodriguez-Southworth, [name], PA-C  CAPLYTA 42 MG CAPS Take 42 mg by mouth at bedtime.  03/19/19  Yes [provider]  clonazePAM (KLONOPIN) 1 MG tablet Take 1 mg by mouth 3 (three) times daily.   Yes [provider]  Cyanocobalamin (B-12) 500 MCG SUBL One SL QD Patient taking differently: Take 500 mcg by mouth daily.  10/07/18  Yes Rodriguez-Southworth, [name], PA-C  DEXILANT 60 MG capsule Take 1 capsule by mouth daily. Patient taking  differently: Take 60 mg by mouth daily.  08/30/19  Yes [name], FNP  diclofenac Sodium (VOLTAREN) 1 % GEL Apply 4 grams topically four times daily. Patient taking differently: Apply 4 g topically 4 (four) times daily as needed (topical pain .).  09/02/19  Yes Rodriguez-Southworth, [name], PA-C  divalproex (DEPAKOTE ER) 500 MG 24 hr tablet Take 1,000 mg by mouth daily.  02/09/18  Yes [provider]  docusate sodium (COLACE) 50 MG capsule Take 50 mg by mouth daily.    Yes [provider]  ELDERBERRY PO Take 1 tablet by mouth at bedtime.   Yes [provider]  fluticasone (FLOVENT HFA) 110 MCG/ACT inhaler Inhale 2 puffs into the lungs every 12 (twelve) hours.   Yes [provider]  FREESTYLE LITE test strip CHECK BLOOD SUGAR 3 TO 4 TIMES DAILY Patient taking differently: 1 each by Other route once a  week.  09/16/18  Yes Glendale Chard, MD  hydrochlorothiazide (HYDRODIURIL) 12.5 MG tablet Take 1 tablet by mouth once daily. Patient taking differently: Take 12.5 mg by mouth daily.  08/02/19  Yes Rodriguez-Southworth, [name], PA-C  HYDROmorphone (DILAUDID) 4 MG tablet Take 4 mg by mouth 4 (four) times daily as needed for severe pain.   Yes [provider]  hydrOXYzine (ATARAX/VISTARIL) 25 MG tablet Take 25 mg by mouth in the morning, at noon, in the evening, and at bedtime.    Yes [provider]  lidocaine (XYLOCAINE) 2 % solution Use as directed 15 mLs in the mouth or throat 3 (three) times a week.  04/19/19  Yes [provider]  Lidocaine 4 % PTCH Place 1 patch onto the skin in the morning and at bedtime. 10/05/19  Yes [provider]  losartan (COZAAR) 25 MG tablet Take 1 tablet by mouth daily. Patient taking differently: Take 25 mg by mouth daily.  08/02/19  Yes Rodriguez-Southworth, [name], PA-C  metFORMIN (GLUCOPHAGE) 500 MG tablet Take 1 tablet by mouth twice daily with meals. 09/29/19  Yes Rodriguez-Southworth, [name], PA-C    Multiple Vitamin (MULTIVITAMIN WITH MINERALS) TABS tablet Take 1 tablet by mouth daily.    Yes [provider]  NARCAN 4 MG/0.1ML LIQD nasal spray kit Place 1 spray into the nose as directed. 01/19/18  Yes [provider]  Norgestimate-Ethinyl Estradiol Triphasic (TRI-ESTARYLLA) 0.18/0.215/0.25 MG-35 MCG tablet Take 1 tablet by mouth daily. Patient taking differently: Take 1 tablet by mouth at bedtime.  07/08/19  Yes Rodriguez-Southworth, [name], PA-C  ondansetron (ZOFRAN) 4 MG tablet Take 4 mg by mouth as needed for nausea.    Yes [provider]  PAIN MANAGEMENT INTRATHECAL, IT, PUMP 1 each by Intrathecal route continuous. Intrathecal (IT) medication: fentanyl, clonidine, bupivicaine  Fentanyl 217m/bupivicaine150mg/clonidine30mg  51.943mper day, 30m76mer day, 1.50m6m5.4ml/26mn   Yes [provider]  polyethylene glycol powder (GLYCOLAX/MIRALAX) 17 GM/SCOOP powder Mix 17 grams (1 capful) with 8 ounces of fluid and take by mouth daily for bowels. Patient taking differently: Take 0.5 Containers by mouth daily.  09/29/19  Yes Rodriguez-Southworth, SylviSunday SpillersC  predniSONE (STERAPRED UNI-PAK 21 TAB) 10 MG (21) TBPK tablet Take 6 tabs for 2 days, then 5 for 2 days, then 4 for 2 days, then 3 for 2 days, 2 for 2 days, then 1 for 2 days Patient taking differently: Take 10 mg by mouth See admin instructions. Take 6 tabs for 2 days, then 5 for 2 days, then 4 for 2 days, then 3 for 2 days, 2 for 2 days, then 1 for 2 days 09/17/19  Yes HavilIsla Pence promethazine (PHENERGAN) 25 MG tablet Take 1 tablet (25 mg total) by mouth every 6 (six) hours as needed for nausea or vomiting. 08/16/19  Yes Rodriguez-Southworth, SylviSunday SpillersC  QUEtiapine (SEROQUEL XR) 400 MG 24 hr tablet Take 400 mg by mouth 2 (two) times daily.   Yes [provider]  QUEtiapine (SEROQUEL) 300 MG tablet Take 300 mg by mouth at bedtime.   Yes [provider]  tiZANidine (ZANAFLEX) 4 MG  tablet Take 4 mg by mouth every 4 (four) hours. 08/18/19  Yes [provider]  traZODone (DESYREL) 100 MG tablet 5 qhs prescribed by psychiatry Patient taking differently: Take 100-200 mg by mouth at bedtime. 5 qhs prescribed by psychiatry 02/18/19  Yes Rodriguez-Southworth, SylviSunday SpillersC  triamcinolone cream (KENALOG) 0.1 % Apply 1 application topically daily as needed (for itching). 03/08/19  Yes Rodriguez-Southworth, [name], PA-C  vitamin C (ASCORBIC ACID) 250 MG tablet Take 250 mg by mouth at bedtime.   Yes [provider]  Vitamin D, Ergocalciferol, (DRISDOL) 1.25 MG (50000 UNIT) CAPS capsule Take 1 capsule by mouth twice weekly with a fatty meal. Patient taking differently: Take 50,000 Units by mouth 2 (two) times a week. Sunday and Thursday 08/30/19  Yes [name], FNP  vortioxetine HBr (TRINTELLIX) 10 MG TABS tablet Take 10 mg by mouth at bedtime.   Yes [provider]  VRAYLAR 6 MG CAPS Take 6 mg by mouth daily.  08/05/18  Yes [provider]    Allergies    Lisinopril, Latex, Morphine and related, and Adhesive [tape]  Review of Systems   Review of Systems  All other systems are reviewed and are negative for acute change except as noted in the HPI.   Physical Exam Updated Vital Signs BP 131/83 (BP Location: Right Arm)   Pulse 68   Temp 98.4 F (36.9 C) (Oral)   Resp 14   SpO2 100%   Physical Exam Vitals and nursing note reviewed.  Constitutional:      General: She is not in acute distress.    Appearance: She is not ill-appearing.  HENT:     Head: Normocephalic and atraumatic.     Comments: No tenderness to palpation of skull. No deformities or crepitus noted. No open wounds, abrasions or lacerations.     Right Ear: Tympanic membrane and external ear normal.     Left Ear: Tympanic membrane and external ear normal.     Nose: Nose normal.     Mouth/Throat:     Mouth: Mucous membranes are moist.     Pharynx: Oropharynx is clear.  Eyes:       General: No scleral icterus.       Right eye: No discharge.        Left eye: No discharge.     Extraocular Movements: Extraocular movements intact.     Conjunctiva/sclera: Conjunctivae normal.     Pupils: Pupils are equal, round, and reactive to light.  Neck:     Vascular: No JVD.  Cardiovascular:     Rate and Rhythm: Normal rate and regular rhythm.     Pulses: Normal pulses.          Radial pulses are 2+ on the right side and 2+ on the left side.     Heart sounds: Normal heart sounds.  Pulmonary:     Comments: Lungs clear to auscultation in all fields. Symmetric chest rise. No wheezing, rales, or rhonchi. Abdominal:     Comments: Abdomen is soft, non-distended, exquisite tenderness to right lower quadrant.  No rigidity, no guarding. No peritoneal signs.  Musculoskeletal:        General: Normal range of motion.     Cervical back: Normal range of motion.  Skin:    General: Skin is warm and dry.     Capillary Refill: Capillary refill takes less than 2 seconds.  Neurological:     Mental Status: She is oriented to person, place, and time.     GCS: GCS eye subscore is 4. GCS verbal subscore is 5. GCS motor subscore is 6.     Comments: Fluent speech, no facial droop.  Patient follows commands.  Awake alert oriented x3 Speech is hypophonic-no evidence of dysarthria or aphasia. Cranial nerves II through XII intact.  She is antigravity without drift in all 4 extremities with coaching. Extremely slow  to respond to commands because of pain. Subjective tingling sensation on the right side of the body but no numbness. Coordination is difficult to assess due to slowness of motion due to pain.  Unable to assess gait as patient has significant fall risk   Psychiatric:        Behavior: Behavior normal.     ED Results / Procedures / Treatments   Labs (all labs ordered are listed, but only abnormal results are displayed) Labs Reviewed  BASIC METABOLIC PANEL - Abnormal; Notable for  the following components:      Result Value   Glucose, Bld 161 (*)    All other components within normal limits  URINALYSIS, ROUTINE W REFLEX MICROSCOPIC - Abnormal; Notable for the following components:   Color, Urine AMBER (*)    APPearance HAZY (*)    Leukocytes,Ua MODERATE (*)    Bacteria, UA FEW (*)    All other components within normal limits  RAPID URINE DRUG SCREEN, HOSP PERFORMED - Abnormal; Notable for the following components:   Opiates POSITIVE (*)    Benzodiazepines POSITIVE (*)    All other components within normal limits  CBG MONITORING, ED - Abnormal; Notable for the following components:   Glucose-Capillary 144 (*)    All other components within normal limits  I-STAT CHEM 8, ED - Abnormal; Notable for the following components:   Calcium, Ion 1.02 (*)    All other components within normal limits  URINE CULTURE  CBC  ETHANOL  DIFFERENTIAL  CBC  PROTIME-INR  APTT  I-STAT BETA HCG BLOOD, ED ([facility], [facility], [facility] ONLY)    EKG EKG Interpretation  Date/Time:  Wednesday October 06 2019 14:02:22 EDT Ventricular Rate:  65 PR Interval:  162 QRS Duration: 84 QT Interval:  390 QTC Calculation: 405 R Axis:   31 Text Interpretation: Normal sinus rhythm T wave abnormality, consider inferior ischemia Abnormal ECG no wpw, prolonged qt or brugada downsloping st segments in the inferior and lateral leads seen on prior though more pronounced Otherwise no significant change Confirmed by Deno Etienne [phone]) on 10/06/2019 3:27:37 PM   Radiology CT Angio Head W or Wo Contrast  Result Date: 10/06/2019 CLINICAL DATA:  Right-sided deficits, code stroke follow-up EXAM: CT ANGIOGRAPHY HEAD AND NECK TECHNIQUE: Multidetector CT imaging of the head and neck was performed using the standard protocol during bolus administration of intravenous contrast. Multiplanar CT image reconstructions and MIPs were obtained to evaluate the vascular anatomy. Carotid stenosis measurements (when applicable) are  obtained utilizing NASCET criteria, using the distal internal carotid diameter as the denominator. CONTRAST:  91m OMNIPAQUE IOHEXOL 350 MG/ML SOLN COMPARISON:  None. FINDINGS: CTA NECK FINDINGS Aortic arch: Great vessel origins are patent. Right carotid system: Patent.  No measurable stenosis. Left carotid system: Patent.  No measurable stenosis. Vertebral arteries: Patent. Right vertebral artery is slightly dominant. Skeleton: Mild degenerative changes of the cervical spine. Other neck: Mildly enlarged, multinodular thyroid. No mass or adenopathy. Upper chest: No apical lung mass. Review of the MIP images confirms the above findings CTA HEAD FINDINGS Anterior circulation: Intracranial internal carotid arteries are patent. Anterior and middle cerebral arteries are patent. Anterior communicating artery is present. Posterior circulation: Intracranial vertebral arteries, basilar artery, and posterior cerebral arteries are patent. Venous sinuses: Not well evaluated. Review of the MIP images confirms the above findings IMPRESSION: No large vessel occlusion, hemodynamically significant stenosis, or evidence of dissection. Electronically Signed   By: PMacy MisM.D.   On: 10/06/2019 18:42  DG Chest 2 View  Result Date: 10/06/2019 CLINICAL DATA:  54 year old female with fall and right chest and shoulder pain. EXAM: CHEST - 2 VIEW; RIGHT SHOULDER - 2+ VIEW COMPARISON:  Chest radiograph dated 09/17/2019. FINDINGS: The lungs are clear. There is no pleural effusion or pneumothorax. The cardiac silhouette is within limits. No acute osseous pathology. There is no acute fracture or dislocation of the right shoulder. The bones are well mineralized. No arthritic changes. The soft tissues are unremarkable. IMPRESSION: Negative. Electronically Signed   By: Anner Crete M.D.   On: 10/06/2019 19:33   CT Angio Neck W and/or Wo Contrast  Result Date: 10/06/2019 CLINICAL DATA:  Right-sided deficits, code stroke follow-up  EXAM: CT ANGIOGRAPHY HEAD AND NECK TECHNIQUE: Multidetector CT imaging of the head and neck was performed using the standard protocol during bolus administration of intravenous contrast. Multiplanar CT image reconstructions and MIPs were obtained to evaluate the vascular anatomy. Carotid stenosis measurements (when applicable) are obtained utilizing NASCET criteria, using the distal internal carotid diameter as the denominator. CONTRAST:  76m OMNIPAQUE IOHEXOL 350 MG/ML SOLN COMPARISON:  None. FINDINGS: CTA NECK FINDINGS Aortic arch: Great vessel origins are patent. Right carotid system: Patent.  No measurable stenosis. Left carotid system: Patent.  No measurable stenosis. Vertebral arteries: Patent. Right vertebral artery is slightly dominant. Skeleton: Mild degenerative changes of the cervical spine. Other neck: Mildly enlarged, multinodular thyroid. No mass or adenopathy. Upper chest: No apical lung mass. Review of the MIP images confirms the above findings CTA HEAD FINDINGS Anterior circulation: Intracranial internal carotid arteries are patent. Anterior and middle cerebral arteries are patent. Anterior communicating artery is present. Posterior circulation: Intracranial vertebral arteries, basilar artery, and posterior cerebral arteries are patent. Venous sinuses: Not well evaluated. Review of the MIP images confirms the above findings IMPRESSION: No large vessel occlusion, hemodynamically significant stenosis, or evidence of dissection. Electronically Signed   By: PMacy MisM.D.   On: 10/06/2019 18:42   CT C-SPINE NO CHARGE  Result Date: 10/06/2019 CLINICAL DATA:  Hypertension. EXAM: CT CERVICAL SPINE WITHOUT CONTRAST TECHNIQUE: Multidetector CT imaging of the cervical spine was performed without intravenous contrast. Multiplanar CT image reconstructions were also generated. COMPARISON:  CT angiography neck completed on the same date. FINDINGS: Alignment: Subtle grade 1 retrolisthesis of C4 on C5,  likely degenerative. Straightening of the cervical lordosis. Skull base and vertebrae: No cervical compression fracture or fracture lucency. Advanced narrowing and sclerosis at the atlantoaxial articulation. Small intravertebral disc herniations at C4 and C7. Soft tissues and spinal canal: No prevertebral fluid or swelling. No visible canal hematoma. Goiter. Disc levels: Moderate disc space narrowing and bulky spondylosis from C3 through C7. No posttraumatic widening or displacement. Upper chest: No apical pneumothorax or suspicious pulmonary nodule. Other: Benign nuchal calcifications at the C5-6 levels. Mild bone demineralization. IMPRESSION: Moderate osteoarthropathy with cervical spondylosis from C3 through C7, and likely degenerative grade 1 retrolisthesis of C4 on C5. Cervical muscle spasm. Electronically Signed   By: RRevonda Humphrey  On: 10/06/2019 19:03   CT HEAD CODE STROKE WO CONTRAST  Result Date: 10/06/2019 CLINICAL DATA:  Code stroke.  Right-sided deficits, vertigo EXAM: CT HEAD WITHOUT CONTRAST TECHNIQUE: Contiguous axial images were obtained from the base of the skull through the vertex without intravenous contrast. COMPARISON:  None. FINDINGS: Brain: There is no acute intracranial hemorrhage, mass effect, or edema. Gray-white differentiation is preserved. There is no extra-axial fluid collection. Ventricles and sulci are within normal limits in size and configuration.  Vascular: No hyperdense vessel or unexpected calcification. Skull: Calvarium is unremarkable. Sinuses/Orbits: No acute finding. Other: None. ASPECTS ([place] Stroke Program Early CT Score) - Ganglionic level infarction (caudate, lentiform nuclei, internal capsule, insula, M1-M3 cortex): 7 - Supraganglionic infarction (M4-M6 cortex): 3 Total score (0-10 with 10 being normal): 10 IMPRESSION: No acute intracranial hemorrhage or evidence of acute infarction. ASPECT score is 10. These results were communicated to Dr. Rory Percy at 6:08 pmon  3/17/2021by text page via the [facility] messaging system. Electronically Signed   By: Macy Mis M.D.   On: 10/06/2019 18:10    Procedures Procedures (including critical care time)  Medications Ordered in ED Medications  promethazine (PHENERGAN) injection 25 mg (has no administration in time range)  sodium chloride flush (NS) 0.9 % injection 3 mL (3 mLs Intravenous Given 10/06/19 1717)  iohexol (OMNIPAQUE) 350 MG/ML injection 60 mL (60 mLs Intravenous Contrast Given 10/06/19 1810)  HYDROmorphone (DILAUDID) injection 1 mg (1 mg Intravenous Given 10/06/19 2027)  ondansetron (ZOFRAN) injection 4 mg (4 mg Intravenous Given 10/06/19 2025)  iohexol (OMNIPAQUE) 300 MG/ML solution 80 mL (80 mLs Intravenous Contrast Given 10/06/19 1941)    ED Course  I have reviewed the triage vital signs and the nursing notes.  Pertinent labs & imaging results that were available during my care of the patient were reviewed by me and considered in my medical decision making (see chart for details).  Clinical Course as of Oct 05 2105  Wed Oct 06, 2019  1750 Patient evaluated by ED attending Dr. Sedonia Small, code stroke initiated. RN and charge RN made aware    [NG]  2952 Imaging reviewed by neurology and it is negative for stroke.   [KA]    Clinical Course User Index [KA] Caraline Deutschman, Harley Hallmark, PA-C   MDM Rules/Calculators/A&P                      Patient seen and examined. Patient presents awake, alert, hemodynamically stable, afebrile, non toxic.   Patient without signs of serious head or neck injury.  She does have midline cervical spine tenderness, wearing cervical collar.  She is also reporting subjective tingling on the right side of her body, sensation is intact.  Abdominal exam reveals exquisite tenderness to right lower quadrant, however no peritoneal signs.  No rigidity or guarding.  Normoactive bowel sounds.  Patient's exam is limited by her constant severe pain.  Given dose of pain and nausea  medicine.  Labs here are overall unremarkable.  No leukocytosis, no anemia, no severe electrolyte derangement, no severe renal insufficiency..  Ethanol is negative.  UDS is positive for opiates and benzos.  UA is concerning for possible infection with moderate leukocytes, 21-50 WBC and few bacteria.  Does have squamous epithelial cells present, So questionable UTI versus contaminated sample.  Discussed results with patient and she does mention urinary frequency over the last 3 days.  Will cover for UTI and send urine culture.  Code stroke initiated and patient evaluated by on-call neurology attending Dr. Rory Percy.  Imaging reviewed and patient is negative for stroke.  Head CT shows no acute signs of injury or bleeding.  CTA of head and neck also negative.  X-ray of chest and shoulder viewed by me and do not show any broken bones or dislocations. Cervical spine cleared, collar removed. CT abdomen pelvis viewed be me does not show an obvious intra-abdominal infection , currently waiting on radiologist interpretation.  Patient care transferred to Dr. Sedonia Small at the end  of my shift pending CT A/P and reevaluation. Patient presentation, ED course, and plan of care discussed with review of all pertinent labs and imaging. Please see his note for further details regarding further ED course and disposition.    Final Clinical Impression(s) / ED Diagnoses Final diagnoses:  Back pain    Rx / DC Orders ED Discharge Orders    None       Flint Melter 10/06/19 2233    Maudie Flakes, MD 10/06/19 [phone]

## 2019-10-06 NOTE — ED Notes (Signed)
Family at bedside updated as to patient's status. Negative for stroke per Dr. Rory Percy. EDP notified.

## 2019-10-06 NOTE — ED Triage Notes (Signed)
Pt to triage via [facility] from home.  Pt states she had syncopal episode while walking back from bathroom.  States she felt dizzy prior to fall.  Denies LOC.  Reports neck and back pain.  C-collar in place.  Family reports pt received refill of her pain medication (Fentanyl) in her intrathecal IT pump last week and she is usually more lethargic after receiving medication.

## 2019-10-06 NOTE — ED Notes (Signed)
ED Provider at bedside. Pt is requesting pain medications.

## 2019-10-06 NOTE — ED Provider Notes (Signed)
  Provider Note MRN:  [id number]  Arrival date & time: 10/06/19    ED Course and Medical Decision Making  Assumed care from PA Albrizze at shift change.  Work-up today is reassuring without acute emergent process.  I evaluated the patient's abdomen and there is no increased warmth or erythema near this pain pump to suggest infection.  No systemic signs of illness, no fever.  Patient is resting comfortably.  Daughter has many concerns, patient has been deteriorating for the past month and a half, requiring assistance all day by the daughter.  Currently patient is unable to walk, unable to assist in change of position in any way.  Daughter feels this is a significant change from her baseline and is concerned about her ability to care for at home.  We will consult case management for considering augmentation of home health services versus placement.  Home meds ordered.  Signed out to oncoming provider at shift change.  Procedures  Final Clinical Impressions(s) / ED Diagnoses     ICD-10-CM   1. Acute cystitis without hematuria  N30.00   2. Back pain  M54.9 CT C-SPINE NO CHARGE    CT C-SPINE NO CHARGE    ED Discharge Orders         Ordered    cephALEXin (KEFLEX) 500 MG capsule  3 times daily     10/06/19 2135            Barth Kirks. Sedonia Small, [place] [email or web address]    Maudie Flakes, MD 10/06/19 2249

## 2019-10-06 NOTE — Discharge Instructions (Addendum)
You were evaluated in the Emergency Department and after careful evaluation, we did not find any emergent condition requiring admission or further testing in the hospital.   Take keflex three times daily for a week. Your prescriptions were sent to [facility].  See your doctor.   Home health will follow up with you.   Please return to the Emergency Department if you experience any worsening of your condition.  We encourage you to follow up with a primary care provider.  Thank you for allowing [place] to be a part of your care.

## 2019-10-07 DIAGNOSIS — N3000 Acute cystitis without hematuria: Secondary | ICD-10-CM | POA: Diagnosis not present

## 2019-10-07 MED ORDER — CALCIUM CARBONATE ANTACID 500 MG PO CHEW: 0.5000 | CHEWABLE_TABLET | Freq: Every day | ORAL | Status: DC

## 2019-10-07 NOTE — ED Notes (Signed)
PTAR called @1328 -per [place], RN called by Levada Dy

## 2019-10-07 NOTE — Evaluation (Addendum)
Physical Therapy Evaluation Patient Details Name: Linda Mercado MRN: [id number] DOB: 1965-08-12 Today's Date: 10/07/2019   History of Present Illness  Pt is a 54 y/o female presenting to the ED secondary to fall and syncopal episode. PMH includes HTN, DM, schizoaffective disorder(bipolar type) and s/p lumbar surgery.   Clinical Impression  Pt admitted secondary to problem above with deficits below. Pt with poor tolerance for mobility secondary to pain. Limited ROM in BUE and BLE secondary to pain, and was only able to tolerate rolling in bed. Required max A for bed mobility. Had lengthy discussion with pt and pt's daughter about SNF vs HHPT (safety concerns about getting into/out of home, fall risk, need for DME, etc.). Feel pt would benefit from SNF level therapies at d/c, however, pt may refuse. Will require PTAR transport home if pt refuses SNF. Will continue to follow acutely to maximize functional mobility independence and safety.     Follow Up Recommendations SNF;Supervision/Assistance - 24 hour(max HH if pt refuses SNF )    Equipment Recommendations  Hospital bed;Other (comment)(hoyer lift, hoyer lift pad)    Recommendations for Other Services       Precautions / Restrictions Precautions Precautions: Fall Restrictions Weight Bearing Restrictions: No      Mobility  Bed Mobility Overal bed mobility: Needs Assistance Bed Mobility: Rolling Rolling: Max assist         General bed mobility comments: Max A to roll from side to side. Pt moaning in pain, so further mobility deferred.   Transfers                 General transfer comment: Unable to perform secondary to pain.   Ambulation/Gait                Stairs            Wheelchair Mobility    Modified Rankin (Stroke Patients Only)       Balance                                             Pertinent Vitals/Pain Pain Assessment: Faces Faces Pain Scale: Hurts whole  lot Pain Location: generalized Pain Descriptors / Indicators: Grimacing;Guarding;Moaning Pain Intervention(s): Monitored during session;Limited activity within patient's tolerance;Repositioned    Home Living Family/patient expects to be discharged to:: Private residence Living Arrangements: Children Available Help at Discharge: Family;Available 24 hours/day Type of Home: Mobile home Home Access: Stairs to enter Entrance Stairs-Rails: Right Entrance Stairs-Number of Steps: 6-7 Home Layout: One level Home Equipment: Bedside commode;Tub bench;Wheelchair - [employer] - 2 wheels;Cane - quad      Prior Function Level of Independence: Independent with assistive device(s)         Comments: Was using RW vs cane for short distance ambulation into home. Reports WC did not fit into home.      Hand Dominance        Extremity/Trunk Assessment   Upper Extremity Assessment Upper Extremity Assessment: Generalized weakness(Limited ROM throughout BUE secondary to pain. )    Lower Extremity Assessment Lower Extremity Assessment: RLE deficits/detail;LLE deficits/detail RLE Deficits / Details: Limited ROM throughout secondary to pain. Pt grimacing and moaning throughout PROM.  LLE Deficits / Details: Limited ROM throughout secondary to pain. Pt grimacing and moaning throughout PROM.        Communication   Communication: Other (comment)(very soft spoken)  Cognition  Arousal/Alertness: Lethargic Behavior During Therapy: Flat affect Overall Cognitive Status: Impaired/Different from baseline Area of Impairment: Safety/judgement                         Safety/Judgement: Decreased awareness of safety;Decreased awareness of deficits     General Comments: Pt keeping eyes closed during session. Very soft spoken and at some points hard to understand.       General Comments General comments (skin integrity, edema, etc.): Had lengthy discussion with pt and daughter about SNF vs  home. Educated about concerns about fall risk and inability to mobilize throughout the house. Educated about DME needs if pt were to go home and need for 24/7 assist.     Exercises     Assessment/Plan    PT Assessment Patient needs continued PT services  PT Problem List Decreased strength;Decreased balance;Decreased mobility;Decreased knowledge of use of DME;Decreased activity tolerance;Decreased knowledge of precautions;Decreased safety awareness;Pain       PT Treatment Interventions DME instruction;Gait training;Functional mobility training;Balance training;Therapeutic exercise;Therapeutic activities;Patient/family education    PT Goals (Current goals can be found in the Care Plan section)  Acute Rehab PT Goals Patient Stated Goal: to go home PT Goal Formulation: With patient Time For Goal Achievement: 10/21/19 Potential to Achieve Goals: Fair    Frequency Min 2X/week   Barriers to discharge        Co-evaluation               AM-PAC PT "6 Clicks" Mobility  Outcome Measure Help needed turning from your back to your side while in a flat bed without using bedrails?: Total Help needed moving from lying on your back to sitting on the side of a flat bed without using bedrails?: Total Help needed moving to and from a bed to a chair (including a wheelchair)?: Total Help needed standing up from a chair using your arms (e.g., wheelchair or bedside chair)?: Total Help needed to walk in hospital room?: Total Help needed climbing 3-5 steps with a railing? : Total 6 Click Score: 6    End of Session   Activity Tolerance: Patient limited by pain Patient left: in bed;with call bell/phone within reach;with family/visitor present Nurse Communication: Mobility status PT Visit Diagnosis: Muscle weakness (generalized) (M62.81);History of falling (Z91.81);Difficulty in walking, not elsewhere classified (R26.2);Pain Pain - part of body: (generalized)    Time: [id number] PT Time  Calculation (min) (ACUTE ONLY): 31 min   Charges:   PT Evaluation $PT Eval Moderate Complexity: 1 Mod PT Treatments $Therapeutic Activity: 8-22 mins        Linda Mercado, DPT  Acute Rehabilitation Services  Pager: [phone] Office: [phone]   Rudean Hitt 10/07/2019, 12:28 PM

## 2019-10-07 NOTE — Discharge Planning (Signed)
RNCM, EDRN and student RN met with pt and daughter at bedside to discuss disposition plan.  RNCM explained pros, cons and differences between going home with home health vs skilled nursing facility placement.  Pt and daughter decided to wait for PT evaluation before making final decision.  RNCM will follow up after PT evaluation.

## 2019-10-07 NOTE — ED Provider Notes (Signed)
  Physical Exam  BP 134/75   Pulse 64   Temp 98.4 F (36.9 C) (Oral)   Resp 17   SpO2 96%   Physical Exam  ED Course/Procedures   Clinical Course as of Oct 07 1246  Wed Oct 06, 2019  1750 Patient evaluated by ED attending Dr. Sedonia Small, code stroke initiated. RN and charge RN made aware    [id number] Imaging reviewed by neurology and it is negative for stroke.   [KA]    Clinical Course User Index [KA] Albrizze, Harley Hallmark, PA-C    Procedures  MDM  Patient has history of chronic pain here with worsening pain.  She has a pump for pain and had extensive work-up including CT abdomen pelvis and labs and there is no emergent medical condition.  Case manager has been involved.  Patient refusing rehab initially and was supposed to get home health set up.  Signout pending evaluation by case Freight forwarder.  12:49 PM\ Case management saw patient.  Physical therapy also evaluated patient.  Recommending inpatient rehab but patient refused again.  Will order home health PT and OT and nursing.  Patient will also have a left delivered to her.  Patient's pain is improved.  Stable for discharge back home.      Drenda Freeze, MD 10/07/19 1249

## 2019-10-07 NOTE — Care Management (Signed)
PTAR arrived to transport patient home, and daughter declined the transport requesting to speak with someone other than the ED staff, Patient Experience was contacted [facility] Ron RN in ED went in to speak with patient and family to discuss the discharge options.  ED CM spoke with patient's daughter Jodi [place] [phone] in length concerning patient concerning patient not meeting criteria for admission discussed the options SNF vs [facility] with palliative care.  CM also provided written information explaining these options.  Daytime CM has ordered DME to be delivered to patient's home. Daughter continues to decline SNF placement, but is agreeable to home with [facility] services and Palliative Care services.   CM will update [facility] and forward information to [facility] Palliative services, patient and daughter verbalized understanding, teach back done. Updated Dr. Maryan Rued made aware, PTAR called again for transport home.

## 2019-10-07 NOTE — ED Notes (Signed)
Pt family stops RN in hall, asks about seeing CSW again, spoke to pt about plan for CSW to call pt at home to follow up. Pt family appears satisfied with this. Later, when returning to room to notify pt and family that Corey Harold is in ED for transport at this time, pt family states that she no longer feels comfortable with plan for pt to go home. Encouraged pt family about skilled facilities as recommended by other health professionals during this visit. Pt family states that she would like pt admitted to the hospital until she is able to have full extremity mobility. "If I go home, we will just be coming right back with EMS." Explained to pt that this is why skilled care recommended to family but that there is not an acute issue for pt to be admitted per ED workup making hospital admission less appropriate for her disposition from ED. Asked pt if there were any details of care that she felt this RN was not understanding or listening to; pt family responds that she would like admission to occur and would like to speak to "someone over the doctor." Shared this with case management; AC contacted to speak with family to better understand needs and make a plan. Notified pt family that someone would be to room to speak with them. Communicated to PTAR that transport no longer needed at this time, apologized.

## 2019-10-07 NOTE — ED Notes (Signed)
PTAR recalled @ 1900-per Mariann Laster (CM) called by Levada Dy

## 2019-10-08 LAB — URINE CULTURE: Culture: <10000 — AB

## 2019-10-11 ENCOUNTER — Other Ambulatory Visit: Payer: Self-pay

## 2019-10-11 MED ORDER — HYDROCHLOROTHIAZIDE 12.5 MG PO TABS: 12.5000 mg | ORAL_TABLET | Freq: Every day | ORAL | 0 refills | Status: DC

## 2019-10-12 ENCOUNTER — Other Ambulatory Visit: Payer: Self-pay

## 2019-10-12 ENCOUNTER — Other Ambulatory Visit: Payer: Self-pay | Admitting: Nurse Practitioner

## 2019-10-12 ENCOUNTER — Encounter: Payer: Self-pay | Admitting: Nurse Practitioner

## 2019-10-12 ENCOUNTER — Ambulatory Visit (INDEPENDENT_AMBULATORY_CARE_PROVIDER_SITE_OTHER): Payer: Medicare Other | Admitting: Nurse Practitioner

## 2019-10-12 VITALS — BP 118/80 | HR 81 | Temp 98.4°F | Ht 63.8 in | Wt 151.8 lb

## 2019-10-12 DIAGNOSIS — R110 Nausea: Secondary | ICD-10-CM

## 2019-10-12 DIAGNOSIS — K5901 Slow transit constipation: Secondary | ICD-10-CM | POA: Diagnosis not present

## 2019-10-12 DIAGNOSIS — I10 Essential (primary) hypertension: Secondary | ICD-10-CM

## 2019-10-12 DIAGNOSIS — E559 Vitamin D deficiency, unspecified: Secondary | ICD-10-CM

## 2019-10-12 DIAGNOSIS — E119 Type 2 diabetes mellitus without complications: Secondary | ICD-10-CM | POA: Diagnosis not present

## 2019-10-12 DIAGNOSIS — R296 Repeated falls: Secondary | ICD-10-CM

## 2019-10-12 MED ORDER — NORGESTIM-ETH ESTRAD TRIPHASIC 0.18/0.215/0.25 MG-35 MCG PO TABS: 1.0000 | ORAL_TABLET | Freq: Every day | ORAL | 3 refills | Status: DC

## 2019-10-12 MED ORDER — VITAMIN D (ERGOCALCIFEROL) 1.25 MG (50000 UNIT) PO CAPS: 50000.0000 [IU] | ORAL_CAPSULE | ORAL | 0 refills | Status: DC

## 2019-10-12 MED ORDER — DICLOFENAC SODIUM 1 % EX GEL: CUTANEOUS | 0 refills | Status: DC

## 2019-10-12 MED ORDER — HYDROCHLOROTHIAZIDE 12.5 MG PO TABS: 12.5000 mg | ORAL_TABLET | Freq: Every day | ORAL | 0 refills | Status: DC

## 2019-10-12 MED ORDER — ATORVASTATIN CALCIUM 10 MG PO TABS: 10.0000 mg | ORAL_TABLET | Freq: Every day | ORAL | 1 refills | Status: DC

## 2019-10-12 MED ORDER — METFORMIN HCL 500 MG PO TABS: 500.0000 mg | ORAL_TABLET | Freq: Two times a day (BID) | ORAL | 0 refills | Status: DC

## 2019-10-12 MED ORDER — B-12 500 MCG SL SUBL: SUBLINGUAL_TABLET | SUBLINGUAL | 1 refills | Status: DC

## 2019-10-12 MED ORDER — POLYETHYLENE GLYCOL 3350 17 GM/SCOOP PO POWD: ORAL | 0 refills | Status: DC

## 2019-10-12 MED ORDER — PROMETHAZINE HCL 25 MG PO TABS: 25.0000 mg | ORAL_TABLET | Freq: Four times a day (QID) | ORAL | 0 refills | Status: DC | PRN

## 2019-10-12 MED ORDER — CALCIUM GLUCONATE 500 MG PO TABS: 500.0000 mg | ORAL_TABLET | Freq: Every day | ORAL | 1 refills | Status: DC

## 2019-10-12 MED ORDER — LOSARTAN POTASSIUM 25 MG PO TABS: 25.0000 mg | ORAL_TABLET | Freq: Every day | ORAL | 0 refills | Status: DC

## 2019-10-12 MED ORDER — ATENOLOL 100 MG PO TABS: ORAL_TABLET | ORAL | 0 refills | Status: DC

## 2019-10-12 NOTE — Progress Notes (Signed)
This visit occurred during the SARS-CoV-2 public health emergency.  Safety protocols were in place, including screening questions prior to the visit, additional usage of staff PPE, and extensive cleaning of exam room while observing appropriate contact time as indicated for disinfecting solutions.  Subjective:     Patient ID: Linda Mercado , female    DOB: 1965-08-10 , 54 y.o.   MRN: [id number]   Chief Complaint  Patient presents with  . Hospitalization Follow-up    she stated she has not been doing too well she said she has had a lot of difficulty walking    HPI  She is here today due having an ER visit on 3/17-3/18 for declining in health.  She reports she is unable to do PT due to her back pain.  Home health care is bringing a lift, a shower chair planning to come tomorrow. Since being back home she has not had any falls, unable to keep her balance. She is complaining of her right side pain.     Past Medical History:  Diagnosis Date  . Acid reflux   . Bradycardia   . Chronic back pain   . Diabetes mellitus without complication ([place])   . Hypertension   . Hypokalemia      Family History  Problem Relation Age of Onset  . Diabetes Father   . Hypertension Father   . Kidney disease Mother   . Hypertension Mother   . Bipolar disorder Sister   . Schizophrenia Sister   . Neuropathy Sister   . Hypertension Sister   . Bipolar disorder Brother   . Bipolar disorder Brother   . Schizophrenia Brother   . Bipolar disorder Daughter   . Post-traumatic stress disorder Daughter   . Asthma Daughter   . Depression Daughter   . Bipolar disorder Daughter   . Post-traumatic stress disorder Daughter   . Asthma Daughter   . Depression Daughter   . Asthma Daughter   . Depression Daughter   . Asthma Daughter   . Stroke Neg Hx   . Cancer Neg Hx   . CAD Neg Hx      Current Outpatient Medications:  .  albuterol (VENTOLIN HFA) 108 (90 Base) MCG/ACT inhaler, Inhale 2 puffs into the lungs  every 6 (six) hours as needed for wheezing or shortness of breath., Disp: 3 g, Rfl: 1 .  AMITIZA 24 MCG capsule, Take 24 mcg by mouth 2 (two) times daily. , Disp: , Rfl:  .  atenolol (TENORMIN) 100 MG tablet, Take 1 tablet by mouth daily. (Patient taking differently: Take 100 mg by mouth daily. ), Disp: 30 tablet, Rfl: 0 .  atorvastatin (LIPITOR) 10 MG tablet, Take 1 tablet (10 mg total) by mouth daily. (Patient taking differently: Take 10 mg by mouth at bedtime. ), Disp: 90 tablet, Rfl: 0 .  calcium gluconate 500 MG tablet, Take one Monday through Friday only. (Patient taking differently: Take 500 mg by mouth daily. ), Disp: 90 tablet, Rfl: 0 .  CAPLYTA 42 MG CAPS, Take 42 mg by mouth at bedtime. , Disp: , Rfl:  .  cephALEXin (KEFLEX) 500 MG capsule, Take 1 capsule (500 mg total) by mouth 3 (three) times daily for 7 days., Disp: 21 capsule, Rfl: 0 .  clonazePAM (KLONOPIN) 1 MG tablet, Take 1 mg by mouth 3 (three) times daily., Disp: , Rfl:  .  Cyanocobalamin (B-12) 500 MCG SUBL, One SL QD (Patient taking differently: Take 500 mcg by mouth daily. ),  Disp: 90 tablet, Rfl: 1 .  DEXILANT 60 MG capsule, Take 1 capsule by mouth daily. (Patient taking differently: Take 60 mg by mouth daily. ), Disp: 90 capsule, Rfl: 0 .  diclofenac Sodium (VOLTAREN) 1 % GEL, Apply 4 grams topically four times daily. (Patient taking differently: Apply 4 g topically 4 (four) times daily as needed (topical pain .). ), Disp: 400 g, Rfl: 0 .  divalproex (DEPAKOTE ER) 500 MG 24 hr tablet, Take 1,000 mg by mouth daily. , Disp: , Rfl: 1 .  docusate sodium (COLACE) 50 MG capsule, Take 50 mg by mouth daily. , Disp: , Rfl:  .  ELDERBERRY PO, Take 1 tablet by mouth at bedtime., Disp: , Rfl:  .  fluticasone (FLOVENT HFA) 110 MCG/ACT inhaler, Inhale 2 puffs into the lungs every 12 (twelve) hours., Disp: , Rfl:  .  FREESTYLE LITE test strip, CHECK BLOOD SUGAR 3 TO 4 TIMES DAILY (Patient taking differently: 1 each by Other route once a  week. ), Disp: 150 each, Rfl: 11 .  hydrochlorothiazide (HYDRODIURIL) 12.5 MG tablet, Take 1 tablet (12.5 mg total) by mouth daily., Disp: 90 tablet, Rfl: 0 .  HYDROmorphone (DILAUDID) 4 MG tablet, Take 4 mg by mouth 4 (four) times daily as needed for severe pain., Disp: , Rfl:  .  hydrOXYzine (ATARAX/VISTARIL) 25 MG tablet, Take 25 mg by mouth in the morning, at noon, in the evening, and at bedtime. , Disp: , Rfl:  .  lidocaine (XYLOCAINE) 2 % solution, Use as directed 15 mLs in the mouth or throat 3 (three) times a week. , Disp: , Rfl:  .  Lidocaine 4 % PTCH, Place 1 patch onto the skin in the morning and at bedtime., Disp: , Rfl:  .  losartan (COZAAR) 25 MG tablet, Take 1 tablet by mouth daily. (Patient taking differently: Take 25 mg by mouth daily. ), Disp: 90 tablet, Rfl: 0 .  metFORMIN (GLUCOPHAGE) 500 MG tablet, Take 1 tablet by mouth twice daily with meals., Disp: 180 tablet, Rfl: 0 .  Multiple Vitamin (MULTIVITAMIN WITH MINERALS) TABS tablet, Take 1 tablet by mouth daily. , Disp: , Rfl:  .  NARCAN 4 MG/0.1ML LIQD nasal spray kit, Place 1 spray into the nose as directed., Disp: , Rfl: 0 .  Norgestimate-Ethinyl Estradiol Triphasic (TRI-ESTARYLLA) 0.18/0.215/0.25 MG-35 MCG tablet, Take 1 tablet by mouth daily. (Patient taking differently: Take 1 tablet by mouth at bedtime. ), Disp: 3 Package, Rfl: 3 .  ondansetron (ZOFRAN) 4 MG tablet, Take 4 mg by mouth as needed for nausea. , Disp: , Rfl:  .  PAIN MANAGEMENT INTRATHECAL, IT, PUMP, 1 each by Intrathecal route continuous. Intrathecal (IT) medication: fentanyl, clonidine, bupivicaine  Fentanyl 263m/bupivicaine150mg/clonidine45mg  51.977mper day, 45m45mer day, 1.40m40m5.4ml/18mn, Disp: , Rfl:  .  polyethylene glycol powder (GLYCOLAX/MIRALAX) 17 GM/SCOOP powder, Mix 17 grams (1 capful) with 8 ounces of fluid and take by mouth daily for bowels. (Patient taking differently: Take 0.5 Containers by mouth daily. ), Disp: 255 g, Rfl: 0 .  predniSONE  (STERAPRED UNI-PAK 21 TAB) 10 MG (21) TBPK tablet, Take 6 tabs for 2 days, then 5 for 2 days, then 4 for 2 days, then 3 for 2 days, 2 for 2 days, then 1 for 2 days, Disp: 42 tablet, Rfl: 0 .  promethazine (PHENERGAN) 25 MG tablet, Take 1 tablet (25 mg total) by mouth every 6 (six) hours as needed for nausea or vomiting., Disp: 90 tablet, Rfl: 0 .  QUEtiapine (SEROQUEL XR) 400 MG 24 hr tablet, Take 400 mg by mouth 2 (two) times daily., Disp: , Rfl:  .  QUEtiapine (SEROQUEL) 300 MG tablet, Take 300 mg by mouth at bedtime., Disp: , Rfl:  .  tiZANidine (ZANAFLEX) 4 MG tablet, Take 4 mg by mouth every 4 (four) hours., Disp: , Rfl:  .  traZODone (DESYREL) 100 MG tablet, 5 qhs prescribed by psychiatry (Patient taking differently: Take 100-200 mg by mouth at bedtime. 5 qhs prescribed by psychiatry), Disp: 30 tablet, Rfl: 0 .  triamcinolone cream (KENALOG) 0.1 %, Apply 1 application topically daily as needed (for itching)., Disp: 30 g, Rfl: 2 .  vitamin C (ASCORBIC ACID) 250 MG tablet, Take 250 mg by mouth at bedtime., Disp: , Rfl:  .  Vitamin D, Ergocalciferol, (DRISDOL) 1.25 MG (50000 UNIT) CAPS capsule, Take 1 capsule by mouth twice weekly with a fatty meal. (Patient taking differently: Take 50,000 Units by mouth 2 (two) times a week. Sunday and Thursday), Disp: 24 capsule, Rfl: 0 .  vortioxetine HBr (TRINTELLIX) 10 MG TABS tablet, Take 10 mg by mouth at bedtime., Disp: , Rfl:  .  VRAYLAR 6 MG CAPS, Take 6 mg by mouth daily. , Disp: , Rfl:    Allergies  Allergen Reactions  . Lisinopril Hives    Other reaction(s): anaphylaxis/angioedema  . Latex Hives  . Morphine And Related     Itching, skin get hot.   . Adhesive [Tape] Rash     Review of Systems  Constitutional: Negative.   Respiratory: Negative.   Cardiovascular: Negative.  Negative for chest pain, palpitations and leg swelling.  Musculoskeletal: Positive for arthralgias.  Neurological: Negative for dizziness and headaches.   Psychiatric/Behavioral: Negative.      Today's Vitals   10/12/19 1613  BP: 118/80  Pulse: 81  Temp: 98.4 F (36.9 C)  Weight: 151 lb 12.8 oz (68.9 kg)  Height: 5' 3.8" (1.621 m)  PainSc: 0-No pain   Body mass index is 26.22 kg/m.   Objective:  Physical Exam Constitutional:      Appearance: Normal appearance.  Cardiovascular:     Rate and Rhythm: Normal rate and regular rhythm.     Pulses: Normal pulses.     Heart sounds: Normal heart sounds. No murmur.  Pulmonary:     Effort: Pulmonary effort is normal. No respiratory distress.     Breath sounds: Normal breath sounds.  Neurological:     General: No focal deficit present.     Mental Status: She is alert and oriented to person, place, and time.  Psychiatric:        Mood and Affect: Mood normal.        Behavior: Behavior normal.        Thought Content: Thought content normal.        Judgment: Judgment normal.         Assessment And Plan:     1. Slow transit constipation  She is having problems with constipation due to her chronic pain medication use.   2. Type 2 diabetes mellitus without complication, without long-term current use of insulin (HCC)  Chronic, she is doing well  Continue with current medications - metFORMIN (GLUCOPHAGE) 500 MG tablet; Take 1 tablet (500 mg total) by mouth 2 (two) times daily with a meal.  Dispense: 180 tablet; Refill: 0 - Hemoglobin A1c  3. Nausea  She is taking antiemetic medications regularly.   - promethazine (PHENERGAN) 25 MG tablet; Take 1 tablet (25 mg  total) by mouth every 6 (six) hours as needed for nausea or vomiting.  Dispense: 90 tablet; Refill: 0  4. Vitamin D deficiency  Will check vitamin D level and supplement as needed.     Also encouraged to spend 15 minutes in the sun daily.  - Vitamin D, Ergocalciferol, (DRISDOL) 1.25 MG (50000 UNIT) CAPS capsule; Take 1 capsule (50,000 Units total) by mouth 2 (two) times a week. Sunday and Thursday  Dispense: 24 capsule;  Refill: 0 - VITAMIN D 25 Hydroxy (Vit-D Deficiency, Fractures)  5. Essential hypertension  Chronic, good control  Continue with current medications - atenolol (TENORMIN) 100 MG tablet; Take 1 tablet by mouth daily.  Dispense: 30 tablet; Refill: 0  After her recent visit to the hospital it was recommended she be evaluated for palliative care due to her chronic pain, and decline in her health. Will make referral   [name], FNP    THE PATIENT IS ENCOURAGED TO PRACTICE SOCIAL DISTANCING DUE TO THE COVID-19 PANDEMIC.

## 2019-10-13 LAB — HEMOGLOBIN A1C
Est. average glucose Bld gHb Est-mCnc: 146 mg/dL
Hgb A1c MFr Bld: 6.7 % — ABNORMAL HIGH (ref 4.8–5.6)

## 2019-10-13 LAB — VITAMIN D 25 HYDROXY (VIT D DEFICIENCY, FRACTURES): Vit D, 25-Hydroxy: 142 ng/mL — ABNORMAL HIGH (ref 30.0–100.0)

## 2019-10-14 ENCOUNTER — Ambulatory Visit: Payer: Self-pay

## 2019-10-14 DIAGNOSIS — E119 Type 2 diabetes mellitus without complications: Secondary | ICD-10-CM

## 2019-10-14 DIAGNOSIS — G894 Chronic pain syndrome: Secondary | ICD-10-CM

## 2019-10-14 DIAGNOSIS — E063 Autoimmune thyroiditis: Secondary | ICD-10-CM

## 2019-10-14 DIAGNOSIS — I10 Essential (primary) hypertension: Secondary | ICD-10-CM

## 2019-10-14 NOTE — Chronic Care Management (AMB) (Signed)
  Chronic Care Management   Outreach Note  10/14/2019 Name: Linda Mercado MRN: [id number] DOB: [birth year]  Referred by: Shelby Mattocks, PA-C Reason for referral : Chronic Care Management ([facility] Inbound Call-Chronic pain)   An unsuccessful telephone outreach was attempted today. The patient was referred to the case management team for assistance with care management and care coordination.   Follow Up Plan: A HIPPA compliant phone message was left for the patient providing contact information and requesting a return call.  Telephone follow up appointment with care management team member scheduled for: 10/19/19  Barb Merino, RN, BSN, CCM Care Management Coordinator [place] [facility]  Direct Phone: [phone]

## 2019-10-15 ENCOUNTER — Encounter (HOSPITAL_COMMUNITY): Payer: Self-pay

## 2019-10-15 ENCOUNTER — Ambulatory Visit (HOSPITAL_COMMUNITY): Admission: RE | Admit: 2019-10-15 | Payer: Medicare Other | Source: Ambulatory Visit

## 2019-10-18 ENCOUNTER — Ambulatory Visit (HOSPITAL_COMMUNITY): Payer: Medicare Other

## 2019-10-18 ENCOUNTER — Other Ambulatory Visit: Payer: Self-pay

## 2019-10-18 MED ORDER — ALBUTEROL SULFATE HFA 108 (90 BASE) MCG/ACT IN AERS: 2.0000 | INHALATION_SPRAY | Freq: Four times a day (QID) | RESPIRATORY_TRACT | 1 refills | Status: DC | PRN

## 2019-10-19 ENCOUNTER — Telehealth: Payer: Medicare Other

## 2019-10-19 ENCOUNTER — Ambulatory Visit: Payer: Self-pay

## 2019-10-19 ENCOUNTER — Other Ambulatory Visit: Payer: Self-pay

## 2019-10-19 DIAGNOSIS — I10 Essential (primary) hypertension: Secondary | ICD-10-CM

## 2019-10-19 DIAGNOSIS — G894 Chronic pain syndrome: Secondary | ICD-10-CM

## 2019-10-19 DIAGNOSIS — E063 Autoimmune thyroiditis: Secondary | ICD-10-CM

## 2019-10-19 DIAGNOSIS — E119 Type 2 diabetes mellitus without complications: Secondary | ICD-10-CM

## 2019-10-20 NOTE — Chronic Care Management (AMB) (Signed)
  Chronic Care Management   Outreach Note  10/20/2019 Name: Linda Mercado MRN: [id number] DOB: 1965/12/14  Referred by: Shelby Mattocks, PA-C Reason for referral : Chronic Care Management ([facility] RN Call )   A second unsuccessful telephone outreach was attempted today. The patient was referred to the case management team for assistance with care management and care coordination.   Follow Up Plan: A HIPPA compliant phone message was left for the patient providing contact information and requesting a return call.  Telephone follow up appointment with care management team member scheduled for: 10/29/19  Barb Merino, RN, BSN, CCM Care Management Coordinator [place] [facility]  Direct Phone: [phone]

## 2019-10-21 ENCOUNTER — Encounter: Payer: Self-pay | Admitting: Nurse Practitioner

## 2019-10-22 ENCOUNTER — Telehealth: Payer: Self-pay | Admitting: Hospice

## 2019-10-25 ENCOUNTER — Encounter: Payer: Self-pay | Admitting: Internal Medicine

## 2019-10-26 ENCOUNTER — Other Ambulatory Visit: Payer: Self-pay | Admitting: Internal Medicine

## 2019-10-28 ENCOUNTER — Encounter: Payer: Self-pay | Admitting: Nurse Practitioner

## 2019-10-28 ENCOUNTER — Other Ambulatory Visit (HOSPITAL_COMMUNITY)
Admission: RE | Admit: 2019-10-28 | Discharge: 2019-10-28 | Disposition: A | Discharge status: Home / Self Care | Payer: Medicare Other | Source: Ambulatory Visit | Attending: Nurse Practitioner | Admitting: Nurse Practitioner

## 2019-10-28 ENCOUNTER — Ambulatory Visit (INDEPENDENT_AMBULATORY_CARE_PROVIDER_SITE_OTHER): Payer: Medicare Other | Admitting: Nurse Practitioner

## 2019-10-28 ENCOUNTER — Other Ambulatory Visit: Payer: Self-pay

## 2019-10-28 VITALS — BP 118/80 | HR 77 | Temp 98.2°F | Ht 63.8 in | Wt 157.4 lb

## 2019-10-28 DIAGNOSIS — N898 Other specified noninflammatory disorders of vagina: Secondary | ICD-10-CM | POA: Insufficient documentation

## 2019-10-28 LAB — POCT URINALYSIS DIPSTICK
Bilirubin, UA: NEGATIVE
Blood, UA: NEGATIVE
Glucose, UA: NEGATIVE
Ketones, UA: POSITIVE
Nitrite, UA: NEGATIVE
Protein, UA: POSITIVE — AB
Spec Grav, UA: >=1.03 — AB (ref 1.010–1.025)
Urobilinogen, UA: 0.2 E.U./dL
pH, UA: 5.5 (ref 5.0–8.0)

## 2019-10-28 MED ORDER — ACCU-CHEK MULTICLIX LANCETS MISC: 12 refills | Status: DC

## 2019-10-28 MED ORDER — FLUCONAZOLE 100 MG PO TABS: 100.0000 mg | ORAL_TABLET | Freq: Every day | ORAL | 0 refills | Status: DC

## 2019-10-28 MED ORDER — ACCU-CHEK AVIVA PLUS W/DEVICE KIT: PACK | 0 refills | Status: DC

## 2019-10-28 MED ORDER — METRONIDAZOLE 500 MG PO TABS: 500.0000 mg | ORAL_TABLET | Freq: Three times a day (TID) | ORAL | 0 refills | Status: AC

## 2019-10-28 NOTE — Progress Notes (Signed)
This visit occurred during the SARS-CoV-2 public health emergency.  Safety protocols were in place, including screening questions prior to the visit, additional usage of staff PPE, and extensive cleaning of exam room while observing appropriate contact time as indicated for disinfecting solutions.  Subjective:     Patient ID: Linda Mercado , female    DOB: 1965/11/13 , 54 y.o.   MRN: [id number]   Chief Complaint  Patient presents with  . vaginal odor    patient stated she has a yeast infection from an antibotic she is taking    HPI  She reports having a yeast infection from antibiotics she took from when she was at the hospital on 3/18 she was treated antibiotics due to moderate leukocytes.  Vaginal odor since Tuesday does not feel like a fishy odor.  She just finished a round of antibiotics. No vaginal discharge.      Past Medical History:  Diagnosis Date  . Acid reflux   . Bradycardia   . Chronic back pain   . Diabetes mellitus without complication ([place])   . Hypertension   . Hypokalemia      Family History  Problem Relation Age of Onset  . Diabetes Father   . Hypertension Father   . Kidney disease Mother   . Hypertension Mother   . Bipolar disorder Sister   . Schizophrenia Sister   . Neuropathy Sister   . Hypertension Sister   . Bipolar disorder Brother   . Bipolar disorder Brother   . Schizophrenia Brother   . Bipolar disorder Daughter   . Post-traumatic stress disorder Daughter   . Asthma Daughter   . Depression Daughter   . Bipolar disorder Daughter   . Post-traumatic stress disorder Daughter   . Asthma Daughter   . Depression Daughter   . Asthma Daughter   . Depression Daughter   . Asthma Daughter   . Stroke Neg Hx   . Cancer Neg Hx   . CAD Neg Hx      Current Outpatient Medications:  .  albuterol (VENTOLIN HFA) 108 (90 Base) MCG/ACT inhaler, Inhale 2 puffs into the lungs every 6 (six) hours as needed for wheezing or shortness of breath., Disp: 3 g,  Rfl: 1 .  AMITIZA 24 MCG capsule, Take 24 mcg by mouth 2 (two) times daily. , Disp: , Rfl:  .  atenolol (TENORMIN) 100 MG tablet, TAKE 1 TABLET BY MOUTH DAILY, Disp: 90 tablet, Rfl: 1 .  atorvastatin (LIPITOR) 10 MG tablet, Take 1 tablet (10 mg total) by mouth at bedtime., Disp: 90 tablet, Rfl: 1 .  calcium gluconate 500 MG tablet, Take 1 tablet (500 mg total) by mouth daily., Disp: 90 tablet, Rfl: 1 .  CAPLYTA 42 MG CAPS, Take 42 mg by mouth at bedtime. , Disp: , Rfl:  .  clonazePAM (KLONOPIN) 1 MG tablet, Take 1 mg by mouth 3 (three) times daily., Disp: , Rfl:  .  Cyanocobalamin (B-12) 500 MCG SUBL, One SL QD, Disp: 90 tablet, Rfl: 1 .  DEXILANT 60 MG capsule, Take 1 capsule by mouth daily. (Patient taking differently: Take 60 mg by mouth daily. ), Disp: 90 capsule, Rfl: 0 .  diclofenac Sodium (VOLTAREN) 1 % GEL, Apply 4 grams topically four times daily., Disp: 400 g, Rfl: 1 .  divalproex (DEPAKOTE ER) 500 MG 24 hr tablet, Take 1,000 mg by mouth daily. , Disp: , Rfl: 1 .  docusate sodium (COLACE) 50 MG capsule, Take 50 mg by mouth  daily. , Disp: , Rfl:  .  ELDERBERRY PO, Take 1 tablet by mouth at bedtime., Disp: , Rfl:  .  fluticasone (FLOVENT HFA) 110 MCG/ACT inhaler, Inhale 2 puffs into the lungs every 12 (twelve) hours., Disp: , Rfl:  .  FREESTYLE LITE test strip, CHECK BLOOD SUGAR 3 TO 4 TIMES DAILY (Patient taking differently: 1 each by Other route once a week. ), Disp: 150 each, Rfl: 11 .  hydrochlorothiazide (HYDRODIURIL) 12.5 MG tablet, Take 1 tablet (12.5 mg total) by mouth daily., Disp: 90 tablet, Rfl: 0 .  HYDROmorphone (DILAUDID) 4 MG tablet, Take 4 mg by mouth 4 (four) times daily as needed for severe pain., Disp: , Rfl:  .  hydrOXYzine (ATARAX/VISTARIL) 25 MG tablet, Take 25 mg by mouth in the morning, at noon, in the evening, and at bedtime. , Disp: , Rfl:  .  lidocaine (XYLOCAINE) 2 % solution, Use as directed 15 mLs in the mouth or throat 3 (three) times a week. , Disp: , Rfl:   .  Lidocaine 4 % PTCH, Place 1 patch onto the skin in the morning and at bedtime., Disp: , Rfl:  .  losartan (COZAAR) 25 MG tablet, Take 1 tablet (25 mg total) by mouth daily., Disp: 90 tablet, Rfl: 0 .  metFORMIN (GLUCOPHAGE) 500 MG tablet, Take 1 tablet (500 mg total) by mouth 2 (two) times daily with a meal., Disp: 180 tablet, Rfl: 0 .  Multiple Vitamin (MULTIVITAMIN WITH MINERALS) TABS tablet, Take 1 tablet by mouth daily. , Disp: , Rfl:  .  NARCAN 4 MG/0.1ML LIQD nasal spray kit, Place 1 spray into the nose as directed., Disp: , Rfl: 0 .  Norgestimate-Ethinyl Estradiol Triphasic (TRI-ESTARYLLA) 0.18/0.215/0.25 MG-35 MCG tablet, Take 1 tablet by mouth daily., Disp: 3 Package, Rfl: 3 .  ondansetron (ZOFRAN) 4 MG tablet, Take 4 mg by mouth as needed for nausea. , Disp: , Rfl:  .  PAIN MANAGEMENT INTRATHECAL, IT, PUMP, 1 each by Intrathecal route continuous. Intrathecal (IT) medication: fentanyl, clonidine, bupivicaine  Fentanyl 29m/bupivicaine150mg/clonidine15mg  51.966mper day, 15m89mer day, 1.89m28m5.4ml/515mn, Disp: , Rfl:  .  polyethylene glycol powder (GLYCOLAX/MIRALAX) 17 GM/SCOOP powder, Mix 17 grams (1 capful) with 8 ounces of fluid and take by mouth daily for bowels., Disp: 255 g, Rfl: 1 .  promethazine (PHENERGAN) 25 MG tablet, Take 1 tablet (25 mg total) by mouth every 6 (six) hours as needed for nausea or vomiting., Disp: 90 tablet, Rfl: 0 .  QUEtiapine (SEROQUEL XR) 400 MG 24 hr tablet, Take 400 mg by mouth 2 (two) times daily., Disp: , Rfl:  .  QUEtiapine (SEROQUEL) 300 MG tablet, Take 300 mg by mouth at bedtime., Disp: , Rfl:  .  tiZANidine (ZANAFLEX) 4 MG tablet, Take 4 mg by mouth every 4 (four) hours., Disp: , Rfl:  .  traZODone (DESYREL) 100 MG tablet, 5 qhs prescribed by psychiatry (Patient taking differently: Take 100-200 mg by mouth at bedtime. 5 qhs prescribed by psychiatry), Disp: 30 tablet, Rfl: 0 .  triamcinolone cream (KENALOG) 0.1 %, Apply 1 application topically daily  as needed (for itching)., Disp: 30 g, Rfl: 2 .  vitamin C (ASCORBIC ACID) 250 MG tablet, Take 250 mg by mouth at bedtime., Disp: , Rfl:  .  Vitamin D, Ergocalciferol, (DRISDOL) 1.25 MG (50000 UNIT) CAPS capsule, TAKE ONE CAPSULE BY MOUTH 2 TIMES A WEEK(SUNDAY AND THURSDAY), Disp: 24 capsule, Rfl: 0 .  vortioxetine HBr (TRINTELLIX) 10 MG TABS tablet, Take 10 mg  by mouth at bedtime., Disp: , Rfl:  .  VRAYLAR 6 MG CAPS, Take 6 mg by mouth daily. , Disp: , Rfl:    Allergies  Allergen Reactions  . Lisinopril Hives    Other reaction(s): anaphylaxis/angioedema  . Latex Hives  . Morphine And Related     Itching, skin get hot.   . Adhesive [Tape] Rash     Review of Systems  Constitutional: Negative.   Respiratory: Negative.   Cardiovascular: Negative.  Negative for chest pain, palpitations and leg swelling.  Genitourinary: Positive for dysuria. Negative for frequency and urgency.  Psychiatric/Behavioral: Negative.      Today's Vitals   10/28/19 1113  BP: 118/80  Pulse: 77  Temp: 98.2 F (36.8 C)  TempSrc: Oral  Weight: 157 lb 6.4 oz (71.4 kg)  Height: 5' 3.8" (1.621 m)  PainSc: 8    Body mass index is 27.19 kg/m.   Objective:  Physical Exam Constitutional:      Appearance: Normal appearance.  Cardiovascular:     Rate and Rhythm: Normal rate and regular rhythm.     Pulses: Normal pulses.     Heart sounds: Normal heart sounds. No murmur. No friction rub.  Pulmonary:     Effort: Pulmonary effort is normal. No respiratory distress.     Breath sounds: Normal breath sounds.  Neurological:     General: No focal deficit present.     Mental Status: She is alert and oriented to person, place, and time.     Cranial Nerves: No cranial nerve deficit.  Psychiatric:        Mood and Affect: Mood normal.        Behavior: Behavior normal.        Thought Content: Thought content normal.        Judgment: Judgment normal.         Assessment And Plan:     1. Vaginal odor  No  abnormal findings on physical exam  Will treat for bacterial vaginosis pending results of swab - POCT Urinalysis Dipstick (81002) - fluconazole (DIFLUCAN) 100 MG tablet; Take 1 tablet (100 mg total) by mouth daily. Take 1 tablet by mouth now repeat in 5 days  Dispense: 2 tablet; Refill: 0 - metroNIDAZOLE (FLAGYL) 500 MG tablet; Take 1 tablet (500 mg total) by mouth 3 (three) times daily for 10 days.  Dispense: 30 tablet; Refill: 0 - Cervicovaginal ancillary only   [name], FNP    THE PATIENT IS ENCOURAGED TO PRACTICE SOCIAL DISTANCING DUE TO THE COVID-19 PANDEMIC.

## 2019-10-29 ENCOUNTER — Telehealth: Payer: Medicaid Other

## 2019-10-31 ENCOUNTER — Other Ambulatory Visit: Payer: Self-pay | Admitting: Internal Medicine

## 2019-10-31 NOTE — Progress Notes (Unsigned)
Please request her colonoscopy report from [facility]. I only see her endoscopy report. If they dont have it, please ask Chantel if she has one scheduled, since she is over due to have one. Also ask her if she had her eye exam for her DM yearly check and where so we can get the report. Please ask her  When her last TDAP was and if she had her pneumonia shot yet. Health maintenance needs to be updated. Thank you.

## 2019-11-01 NOTE — Progress Notes (Signed)
Spoke w/pt she stated she will get the TDAP, but she does not want the Pneumonia at this time. She also stated she seen the eye doctor 2-3 months ago and will contact them to send over the office note. She ask if you can put in a new referral for the rheumatologist that she use to see before because they said she would need a new one in order for her to make an appt.  Dr. Benson Norway stated her esophagus is front and center so she has not had a colonoscopy yet

## 2019-11-02 ENCOUNTER — Telehealth: Payer: Self-pay

## 2019-11-02 NOTE — Telephone Encounter (Signed)
Phone call placed to patient to introduce Palliative Care and to offer to schedule a visit for NP. Verbal consent obtained. Patient requested a tele-health visit. Scheduled for 11/03/19 @ 2pm.

## 2019-11-03 ENCOUNTER — Other Ambulatory Visit: Payer: Self-pay

## 2019-11-03 ENCOUNTER — Other Ambulatory Visit: Payer: Medicare Other | Admitting: Hospice

## 2019-11-03 DIAGNOSIS — Z515 Encounter for palliative care: Secondary | ICD-10-CM

## 2019-11-03 DIAGNOSIS — G894 Chronic pain syndrome: Secondary | ICD-10-CM

## 2019-11-03 DIAGNOSIS — F250 Schizoaffective disorder, bipolar type: Secondary | ICD-10-CM

## 2019-11-03 LAB — CERVICOVAGINAL ANCILLARY ONLY
Bacterial Vaginitis (gardnerella): POSITIVE — AB
Candida Glabrata: NEGATIVE
Candida Vaginitis: NEGATIVE
Comment: NEGATIVE
Comment: NEGATIVE
Comment: NEGATIVE

## 2019-11-03 NOTE — Progress Notes (Signed)
[facility] Consult Note Telephone: [phone]  Fax: [phone]  PATIENT NAME: Linda Mercado DOB: October 19, 1965 MRN: [id number]  PRIMARY CARE PROVIDER:   Shelby Mattocks, PA-C  REFERRING PROVIDER:  [name], NP Works with PCP RESPONSIBLE PARTY:   [facility] [phone] Lives with God daughter Jenny Reichmann  [phone] Twin children live at home   TELEHEALTH VISIT STATEMENT Due to the COVID-19 crisis, this visit was done via telephone from my office. It was initiated and consented to by this patient and/or family.  RECOMMENDATIONS/PLAN:   Advance Care Planning/Goals of Care: Telehealth Visit at the request of [name] NP for palliative consult. Visit consisted of building trust and discussions on Palliative Medicine as specialized medical care for people living with serious illness, aimed at facilitating better quality of life through symptoms relief, assisting with advance care plan and establishing goals of care. Patient said she is a full code and wants to stay that way for now. Goals of care include to maximize quality of life and symptom management. Patient says she has four children. She prefers to be called [name] with Y not E. She shared her painful marital life and subsequent divorce. Therapeutic listening provided.  Symptom management: She reported she has chronic pain; her whole body hurts and very cold so it hurts to move; efforts to do PT in the past has been unsuccessful because she was in so much pain. She has a pain pump since August 2017. Plan is to change the pain medications, per her pain doctor; her pain clinic is at [facility]. She uses a cane for ambulation.  Abdomen is still tender. She endorsed schizoaffective, hallucinating, hearing voices. She sees a Teacher, music, next appointment 11/30/2019. She takes Seroquel, Trazadone Deparkote. She is on Amitza and Miralax; these are  helpful for constipation She said she is on Metformin for Type 2 DM last A1c 10/29/2019 is 6. Patient said today is one of her good days and she takes it one day at a time. She was on Cephelaxin for UTI but later changed to  Metronidazole. Education provided on taking it as prescribed and to the end. She had no acute concerns. Follow up: Palliative care will continue to follow patient for goals of care clarification and symptom management. I spent one hour and 25 minutes providing this initial consultation; time includes chart review and documentation. More than 50% of the time in this consultation was spent on coordinating communication   HISTORY OF PRESENT ILLNESS:  Linda Mercado is a 54 y.o. year old female with multiple medical problems including Schizoaffective disorder, Bipolar 1, Type 2 DM HTN , chronic pain syndrome, Endometriosis determined by laparoscopy. Palliative Care was asked to help address goals of care.   CODE STATUS: Full  PPS: 50% HOSPICE ELIGIBILITY/DIAGNOSIS: TBD  PAST MEDICAL HISTORY:  Past Medical History:  Diagnosis Date  . Acid reflux   . Bradycardia   . Chronic back pain   . Diabetes mellitus without complication ([place])   . Hypertension   . Hypokalemia     SOCIAL HX:  Social History   Tobacco Use  . Smoking status: Never Smoker  . Smokeless tobacco: Never Used  Substance Use Topics  . Alcohol use: Not Currently    ALLERGIES:  Allergies  Allergen Reactions  . Lisinopril Hives    Other reaction(s): anaphylaxis/angioedema  . Latex Hives  . Morphine And Related  Itching, skin get hot.   . Adhesive [Tape] Rash     PERTINENT MEDICATIONS:  Outpatient Encounter Medications as of 11/03/2019  Medication Sig  . albuterol (VENTOLIN HFA) 108 (90 Base) MCG/ACT inhaler Inhale 2 puffs into the lungs every 6 (six) hours as needed for wheezing or shortness of breath.  . AMITIZA 24 MCG capsule Take 24 mcg by mouth 2 (two) times daily.   [name] atenolol (TENORMIN) 100  MG tablet TAKE 1 TABLET BY MOUTH DAILY  . atorvastatin (LIPITOR) 10 MG tablet Take 1 tablet (10 mg total) by mouth at bedtime.  . Blood Glucose Monitoring Suppl (ACCU-CHEK AVIVA PLUS) w/Device KIT Use to check blood sugars twice daily E11.9  . calcium gluconate 500 MG tablet Take 1 tablet (500 mg total) by mouth daily.  . CAPLYTA 42 MG CAPS Take 42 mg by mouth at bedtime.   . clonazePAM (KLONOPIN) 1 MG tablet Take 1 mg by mouth 3 (three) times daily.  . Cyanocobalamin (B-12) 500 MCG SUBL One SL QD  . DEXILANT 60 MG capsule Take 1 capsule by mouth daily. (Patient taking differently: Take 60 mg by mouth daily. )  . diclofenac Sodium (VOLTAREN) 1 % GEL Apply 4 grams topically four times daily.  . divalproex (DEPAKOTE ER) 500 MG 24 hr tablet Take 1,000 mg by mouth daily.   [name] docusate sodium (COLACE) 50 MG capsule Take 50 mg by mouth daily.   [name] ELDERBERRY PO Take 1 tablet by mouth at bedtime.  . fluconazole (DIFLUCAN) 100 MG tablet Take 1 tablet (100 mg total) by mouth daily. Take 1 tablet by mouth now repeat in 5 days  . fluticasone (FLOVENT HFA) 110 MCG/ACT inhaler Inhale 2 puffs into the lungs every 12 (twelve) hours.  [name] FREESTYLE LITE test strip CHECK BLOOD SUGAR 3 TO 4 TIMES DAILY (Patient taking differently: 1 each by Other route once a week. )  . hydrochlorothiazide (HYDRODIURIL) 12.5 MG tablet Take 1 tablet (12.5 mg total) by mouth daily.  [name] HYDROmorphone (DILAUDID) 4 MG tablet Take 4 mg by mouth 4 (four) times daily as needed for severe pain.  . hydrOXYzine (ATARAX/VISTARIL) 25 MG tablet Take 25 mg by mouth in the morning, at noon, in the evening, and at bedtime.   . Lancets (ACCU-CHEK MULTICLIX) lancets Use as instructed  . lidocaine (XYLOCAINE) 2 % solution Use as directed 15 mLs in the mouth or throat 3 (three) times a week.   . Lidocaine 4 % PTCH Place 1 patch onto the skin in the morning and at bedtime.  [name] losartan (COZAAR) 25 MG tablet Take 1 tablet (25 mg total) by mouth daily.  .  metFORMIN (GLUCOPHAGE) 500 MG tablet Take 1 tablet (500 mg total) by mouth 2 (two) times daily with a meal.  . metroNIDAZOLE (FLAGYL) 500 MG tablet Take 1 tablet (500 mg total) by mouth 3 (three) times daily for 10 days.  . Multiple Vitamin (MULTIVITAMIN WITH MINERALS) TABS tablet Take 1 tablet by mouth daily.   [name] NARCAN 4 MG/0.1ML LIQD nasal spray kit Place 1 spray into the nose as directed.  . Norgestimate-Ethinyl Estradiol Triphasic (TRI-ESTARYLLA) 0.18/0.215/0.25 MG-35 MCG tablet Take 1 tablet by mouth daily.  . ondansetron (ZOFRAN) 4 MG tablet Take 4 mg by mouth as needed for nausea.   [name] PAIN MANAGEMENT INTRATHECAL, IT, PUMP 1 each by Intrathecal route continuous. Intrathecal (IT) medication: fentanyl, clonidine, bupivicaine  Fentanyl 257m/bupivicaine150mg/clonidine38mg  51.953mper day, 38m8mer day, 1.79m538m5.4ml/638mn  . polyethylene glycol powder (  GLYCOLAX/MIRALAX) 17 GM/SCOOP powder Mix 17 grams (1 capful) with 8 ounces of fluid and take by mouth daily for bowels.  . promethazine (PHENERGAN) 25 MG tablet Take 1 tablet (25 mg total) by mouth every 6 (six) hours as needed for nausea or vomiting.  [name] QUEtiapine (SEROQUEL XR) 400 MG 24 hr tablet Take 400 mg by mouth 2 (two) times daily.  . QUEtiapine (SEROQUEL) 300 MG tablet Take 300 mg by mouth at bedtime.  [name] tiZANidine (ZANAFLEX) 4 MG tablet Take 4 mg by mouth every 4 (four) hours.  . traZODone (DESYREL) 100 MG tablet 5 qhs prescribed by psychiatry (Patient taking differently: Take 100-200 mg by mouth at bedtime. 5 qhs prescribed by psychiatry)  . triamcinolone cream (KENALOG) 0.1 % Apply 1 application topically daily as needed (for itching).  . vitamin C (ASCORBIC ACID) 250 MG tablet Take 250 mg by mouth at bedtime.  . Vitamin D, Ergocalciferol, (DRISDOL) 1.25 MG (50000 UNIT) CAPS capsule TAKE ONE CAPSULE BY MOUTH 2 TIMES A WEEK(SUNDAY AND THURSDAY)  . vortioxetine HBr (TRINTELLIX) 10 MG TABS tablet Take 10 mg by mouth at bedtime.  [name]  VRAYLAR 6 MG CAPS Take 6 mg by mouth daily.    No facility-administered encounter medications on file as of 11/03/2019.   Teodoro Spray, NP

## 2019-11-04 ENCOUNTER — Other Ambulatory Visit: Payer: Self-pay | Admitting: Internal Medicine

## 2019-11-04 DIAGNOSIS — E063 Autoimmune thyroiditis: Secondary | ICD-10-CM

## 2019-11-04 DIAGNOSIS — M791 Myalgia, unspecified site: Secondary | ICD-10-CM

## 2019-11-11 ENCOUNTER — Other Ambulatory Visit: Payer: Self-pay

## 2019-11-11 ENCOUNTER — Other Ambulatory Visit: Payer: Self-pay | Admitting: Nurse Practitioner

## 2019-11-11 DIAGNOSIS — I10 Essential (primary) hypertension: Secondary | ICD-10-CM

## 2019-11-11 MED ORDER — CALCIUM GLUCONATE 500 MG PO TABS: 500.0000 mg | ORAL_TABLET | Freq: Three times a day (TID) | ORAL | 1 refills | Status: DC

## 2019-11-17 ENCOUNTER — Ambulatory Visit (INDEPENDENT_AMBULATORY_CARE_PROVIDER_SITE_OTHER): Payer: Medicare Other | Admitting: Orthopaedic Surgery

## 2019-11-17 ENCOUNTER — Other Ambulatory Visit: Payer: Self-pay

## 2019-11-17 ENCOUNTER — Encounter: Payer: Self-pay | Admitting: Orthopaedic Surgery

## 2019-11-17 DIAGNOSIS — G8929 Other chronic pain: Secondary | ICD-10-CM | POA: Diagnosis not present

## 2019-11-17 DIAGNOSIS — M25561 Pain in right knee: Secondary | ICD-10-CM | POA: Diagnosis not present

## 2019-11-17 DIAGNOSIS — M25562 Pain in left knee: Secondary | ICD-10-CM

## 2019-11-17 DIAGNOSIS — G894 Chronic pain syndrome: Secondary | ICD-10-CM

## 2019-11-17 MED ORDER — BUPIVACAINE HCL 0.5 % IJ SOLN
2.0000 mL | INTRAMUSCULAR | Status: AC | PRN
  Administered 2019-11-17: 2 mL via INTRA_ARTICULAR

## 2019-11-17 MED ORDER — METHYLPREDNISOLONE ACETATE 40 MG/ML IJ SUSP
80.0000 mg | INTRAMUSCULAR | Status: AC | PRN
  Administered 2019-11-17: 80 mg via INTRA_ARTICULAR

## 2019-11-17 MED ORDER — LIDOCAINE HCL 1 % IJ SOLN
2.0000 mL | INTRAMUSCULAR | Status: AC | PRN
  Administered 2019-11-17: 2 mL

## 2019-11-17 NOTE — Progress Notes (Signed)
Office Visit Note   Patient: Linda Mercado           Date of Birth: Nov 05, 1965           MRN: [id number] Visit Date: 11/17/2019              Requested by: Shelby Mattocks, PA-C [address] [place],  [place] [address] PCP: Shelby Mattocks, PA-C   Assessment & Plan: Visit Diagnoses:  1. Chronic pain syndrome   2. Chronic pain of left knee   3. Chronic pain of right knee     Plan: I last saw Linda Mercado in October 2020 for evaluation of bilateral knee pain.  I had ordered MRI scans which were performed in January and she is here for follow-up evaluation.  MRI scan of her left knee demonstrated no meniscal tears but moderate chondromalacia patella and mild chondromalacia of the both the medial lateral compartments.  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  Long discussion regarding the findings with Linda Mercado.  She has multiple comorbidities with chronic pain syndrome.  She has a pain pump and is being considered for a spinal stimulator.  She goes to the pain clinic in [place].  I am really concerned about her potential benefit from any knee surgery given all of her comorbidities and think it is best to approach the problem as conservatively as possible.  I am going to start by cortisone injection in the right knee and have her return in 2 weeks for reevaluation.  If it was of any benefit I will inject the left knee. There is no localization of her pain to the lateral compartment of her left knee.  In fact she is having more pain medially in the right knee and beneath her patella.  On the left knee the pain seems to be somewhat diffuse but more localized to the patella.  She has multiple bodily complaints  Follow-Up Instructions: Return in about 2 weeks (around 12/01/2019).   Orders:    Orders Placed This Encounter  Procedures  . Large Joint Inj: R knee   No orders of the defined types were placed in this encounter.     Procedures: Large Joint Inj: R knee on 11/17/2019 11:44 AM Indications: pain and diagnostic evaluation Details: 25 G 1.5 in needle, anterolateral approach  Arthrogram: No  Medications: 2 mL lidocaine 1 %; 2 mL bupivacaine 0.5 %; 80 mg methylPREDNISolone acetate 40 MG/ML Procedure, treatment alternatives, risks and benefits explained, specific risks discussed. Consent was given by the patient. Immediately prior to procedure a time out was called to verify the correct patient, procedure, equipment, support staff and site/side marked as required. Patient was prepped and draped in the usual sterile fashion.       Clinical Data: No additional findings.   Subjective: Chief Complaint  Patient presents with  . Right Knee - Pain  . Left Knee - Pain  Patient presents today for bilateral knee pain. She was seen last in October of last year. She has had MRIs of both knees in January. She states that both knees are so painful and she cannot continue to live with this pain. She said that her knees cause her to fall. Both hurt equally the same.  Since her last office visit in October she has had several hospitalizations related to falls and  problems with balance.  She has had an extensive work-up.  She has seen Dr. Estanislado Pandy in the past without any specific recommendations according to [name].  She is being followed in a chronic pain clinic with a pain pump per Dr. Maryruth Eve in [place].  She is being considered for a spinal stimulator.  She relates multiple bodily complaints with "pain all over".  In regards to her left knee she seems to have pain diffusely with more discomfort along the patellofemoral joint.  She will note some swelling on occasion.  The right knee pain is also diffuse without any localization.  She does use a cane to help with her  balance and ambulation  HPI  Review of Systems   Objective: Vital Signs: There were no vitals taken for this visit.  Physical Exam Constitutional:      Appearance: She is well-developed.  Eyes:     Pupils: Pupils are equal, round, and reactive to light.  Pulmonary:     Effort: Pulmonary effort is normal.  Skin:    General: Skin is warm and dry.  Neurological:     Mental Status: She is alert and oriented to person, place, and time.  Psychiatric:        Behavior: Behavior normal.     Ortho Exam awake alert and oriented x3.  Comfortable sitting.  Straight leg raise was negative bilaterally.  Left knee was not effused.  The knee was not hot red or swollen.  Significant tenderness about everywhere I touched both medial lateral compartments and about the patellofemoral joint.  There was some patellar crepitation.  Full passive extension and flexion over 100 degrees without instability.  No popliteal mass or fullness.  Right knee had a little bit more tenderness medially and beneath the patella.  No specific catching or popping laterally where the MRI scan demonstrated complex tear of the meniscus.  Full extension of flexed over 100 degrees without instability  Specialty Comments:  No specialty comments available.  Imaging: No results found.   PMFS History: Patient Active Problem List   Diagnosis Date Noted  . Pain in right knee 11/17/2019  . Pain in left knee 05/06/2019  . Hashimoto's disease 04/05/2019  . Nausea 11/23/2018  . Frequent falls 09/17/2018  . Skin lesion 09/17/2018  . GERD (gastroesophageal reflux disease)   . Chronic midline low back pain with sciatica 05/27/2018  . Intractable pain 03/20/2018  . Bradycardia   . Chronic pain syndrome 03/18/2018  . 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 Hypokalemia 03/18/2018  . Intractable back pain 01/03/2018  . HTN (hypertension) 01/03/2018  . Type 2 diabetes mellitus without complication ([place]) XX123456  .  Vitamin D deficiency 07/11/2017  . Postmenopausal 04/24/2017  . Microalbuminuria 01/14/2017  . Dyspareunia in female 12/10/2016  . Endometriosis determined by laparoscopy 12/10/2016  . History of exploratory laparotomy 12/10/2016  . Pelvic pain 12/10/2016  . Bipolar 1 disorder ([place]) 09/19/2016  . Cyst of right ovary 09/19/2016  . Severe episode of recurrent major depressive disorder, with psychotic features ([place]) 09/19/2016  . Schizoaffective disorder, bipolar type ([place]) 09/19/2016  . Chronic pain 03/05/2016  . Non morbid obesity due to excess calories 03/05/2016  . S/P lumbar fusion 03/05/2016   Past Medical History:  Diagnosis Date  . Acid reflux   . Bradycardia   . Chronic back pain   . Diabetes mellitus without complication ([place])   . Hypertension   . Hypokalemia     Family History  Problem Relation  Age of Onset  . Diabetes Father   . Hypertension Father   . Kidney disease Mother   . Hypertension Mother   . Bipolar disorder Sister   . Schizophrenia Sister   . Neuropathy Sister   . Hypertension Sister   . Bipolar disorder Brother   . Bipolar disorder Brother   . Schizophrenia Brother   . Bipolar disorder Daughter   . Post-traumatic stress disorder Daughter   . Asthma Daughter   . Depression Daughter   . Bipolar disorder Daughter   . Post-traumatic stress disorder Daughter   . Asthma Daughter   . Depression Daughter   . Asthma Daughter   . Depression Daughter   . Asthma Daughter   . Stroke Neg Hx   . Cancer Neg Hx   . CAD Neg Hx     Past Surgical History:  Procedure Laterality Date  . APPENDECTOMY    . BACK SURGERY     x6  . CESAREAN SECTION     x3  . ESOPHAGEAL MANOMETRY N/A 08/25/2019   Procedure: ESOPHAGEAL MANOMETRY (EM);  Surgeon: Carol Ada, MD;  Location: [facility];  Service: Endoscopy;  Laterality: N/A;  . HERNIA REPAIR     Social History   Occupational History  . Not on file  Tobacco Use  . Smoking status: Never Smoker  . Smokeless  tobacco: Never Used  Substance and Sexual Activity  . Alcohol use: Not Currently  . Drug use: Never  . Sexual activity: Yes

## 2019-11-22 ENCOUNTER — Telehealth: Payer: Self-pay

## 2019-11-22 ENCOUNTER — Other Ambulatory Visit: Payer: Self-pay

## 2019-11-22 ENCOUNTER — Ambulatory Visit: Payer: Self-pay

## 2019-11-22 ENCOUNTER — Telehealth: Payer: Medicare Other

## 2019-11-22 DIAGNOSIS — E063 Autoimmune thyroiditis: Secondary | ICD-10-CM

## 2019-11-22 DIAGNOSIS — R296 Repeated falls: Secondary | ICD-10-CM

## 2019-11-22 DIAGNOSIS — I10 Essential (primary) hypertension: Secondary | ICD-10-CM

## 2019-11-22 DIAGNOSIS — G894 Chronic pain syndrome: Secondary | ICD-10-CM

## 2019-11-22 DIAGNOSIS — E119 Type 2 diabetes mellitus without complications: Secondary | ICD-10-CM

## 2019-11-22 DIAGNOSIS — M791 Myalgia, unspecified site: Secondary | ICD-10-CM

## 2019-11-22 NOTE — Telephone Encounter (Signed)
I called pt to notify her that her ins doesn't cover voltaren gel but she can purchase some OTC. YL,RMA

## 2019-11-23 ENCOUNTER — Other Ambulatory Visit: Payer: Self-pay | Admitting: Internal Medicine

## 2019-11-23 DIAGNOSIS — I10 Essential (primary) hypertension: Secondary | ICD-10-CM

## 2019-11-23 NOTE — Chronic Care Management (AMB) (Addendum)
  Chronic Care Management   Outreach Note  11/22/2019 Name: Linda Mercado MRN: [id number] DOB: [birth year]  Referred by: Shelby Mattocks, PA-C Reason for referral : Chronic Care Management ([facility] RN CM Call )   An unsuccessful telephone outreach was attempted today. The patient was referred to the case management team for assistance with care management and care coordination. 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   Follow Up Plan: Telephone follow up appointment with care management team member scheduled for: 12/14/19  Barb Merino, RN, BSN, CCM Care Management Coordinator [place] [facility]  Direct Phone: [phone]

## 2019-11-25 ENCOUNTER — Other Ambulatory Visit: Payer: Self-pay

## 2019-11-25 DIAGNOSIS — I10 Essential (primary) hypertension: Secondary | ICD-10-CM

## 2019-11-25 MED ORDER — ATENOLOL 100 MG PO TABS: 100.0000 mg | ORAL_TABLET | Freq: Every day | ORAL | 0 refills | Status: DC

## 2019-11-27 ENCOUNTER — Other Ambulatory Visit: Payer: Self-pay | Admitting: Nurse Practitioner

## 2019-11-27 DIAGNOSIS — I10 Essential (primary) hypertension: Secondary | ICD-10-CM

## 2019-11-29 ENCOUNTER — Encounter: Payer: Self-pay | Admitting: Internal Medicine

## 2019-12-01 ENCOUNTER — Ambulatory Visit: Payer: Medicare Other | Admitting: Orthopaedic Surgery

## 2019-12-06 ENCOUNTER — Telehealth: Payer: Self-pay | Admitting: Hospice

## 2019-12-06 NOTE — Telephone Encounter (Signed)
NP called patient for a scheduled telehealth visit several times with no response.  Left a voicemail with callback number.

## 2019-12-09 LAB — HM COLONOSCOPY

## 2019-12-14 ENCOUNTER — Telehealth: Payer: Medicare Other

## 2019-12-14 ENCOUNTER — Ambulatory Visit: Payer: Self-pay

## 2019-12-14 ENCOUNTER — Other Ambulatory Visit: Payer: Self-pay

## 2019-12-14 DIAGNOSIS — G894 Chronic pain syndrome: Secondary | ICD-10-CM

## 2019-12-14 DIAGNOSIS — E119 Type 2 diabetes mellitus without complications: Secondary | ICD-10-CM

## 2019-12-14 DIAGNOSIS — E063 Autoimmune thyroiditis: Secondary | ICD-10-CM

## 2019-12-14 DIAGNOSIS — I10 Essential (primary) hypertension: Secondary | ICD-10-CM

## 2019-12-14 DIAGNOSIS — R296 Repeated falls: Secondary | ICD-10-CM

## 2019-12-15 ENCOUNTER — Ambulatory Visit (INDEPENDENT_AMBULATORY_CARE_PROVIDER_SITE_OTHER): Payer: Medicare Other | Admitting: Orthopaedic Surgery

## 2019-12-15 ENCOUNTER — Other Ambulatory Visit: Payer: Self-pay

## 2019-12-15 ENCOUNTER — Encounter: Payer: Self-pay | Admitting: Orthopaedic Surgery

## 2019-12-15 DIAGNOSIS — G8929 Other chronic pain: Secondary | ICD-10-CM | POA: Diagnosis not present

## 2019-12-15 DIAGNOSIS — M25561 Pain in right knee: Secondary | ICD-10-CM | POA: Diagnosis not present

## 2019-12-15 DIAGNOSIS — M25562 Pain in left knee: Secondary | ICD-10-CM

## 2019-12-15 MED ORDER — LIDOCAINE HCL 1 % IJ SOLN
2.0000 mL | INTRAMUSCULAR | Status: AC | PRN
  Administered 2019-12-15: 2 mL

## 2019-12-15 MED ORDER — METHYLPREDNISOLONE ACETATE 40 MG/ML IJ SUSP
80.0000 mg | INTRAMUSCULAR | Status: AC | PRN
  Administered 2019-12-15: 80 mg via INTRA_ARTICULAR

## 2019-12-15 MED ORDER — BUPIVACAINE HCL 0.5 % IJ SOLN
2.0000 mL | INTRAMUSCULAR | Status: AC | PRN
  Administered 2019-12-15: 2 mL via INTRA_ARTICULAR

## 2019-12-15 NOTE — Progress Notes (Signed)
Office Visit Note   Patient: Linda Mercado           Date of Birth: 1965-11-09           MRN: [id number] Visit Date: 12/15/2019              Requested by: Shelby Mattocks, PA-C [address] [place],  [place] [address] PCP: Shelby Mattocks, PA-C   Assessment & Plan: Visit Diagnoses:  1. Chronic pain of left knee   2. Chronic pain of right knee     Plan: Linda Mercado was seen several weeks ago for evaluation of multiple joint complaints.  She seemed to be having more trouble with her knees at the time she has had bilateral knee MRI scans performed in January.  The scan of the right knee revealed a complex tear of the junction and anterior horn of the lateral meniscus with an intact medial meniscus and cruciate ligaments.  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  She was not experiencing any pain along the lateral aspect of her right knee and I elected to inject it with cortisone she notes it is better. MRI scan of the left knee revealed moderate inflammation in the upper aspect of Hoffa's fat pad and moderate chondromalacia patella.  No joint effusion or meniscal tears.  Ligaments are intact.  She did well enough on the right with the injection that she like to have her left knee injected today.  This was performed without difficulty.  Plan to see her back in a as needed basis.  Linda Mercado does have multiple comorbidities and chronic pain as mentioned before  In multiple locations  Follow-Up Instructions: Return if symptoms worsen or fail to improve.   Orders:  Orders Placed This Encounter  Procedures  . Large Joint Inj: L knee   No orders of the defined types were placed in this encounter.     Procedures: Large Joint Inj: L knee on 12/15/2019 4:02 PM Indications: pain and diagnostic evaluation Details: 25 G 1.5 in needle, anteromedial approach  Arthrogram: No  Medications: 2 mL lidocaine 1 %; 2 mL  bupivacaine 0.5 %; 80 mg methylPREDNISolone acetate 40 MG/ML Procedure, treatment alternatives, risks and benefits explained, specific risks discussed. Consent was given by the patient. Patient was prepped and draped in the usual sterile fashion.       Clinical Data: No additional findings.   Subjective: Chief Complaint  Patient presents with  . Right Knee - Pain  . Left Knee - Pain  Patient presents today for follow up on her knees. She was here one month ago and received a right cortisone injection. She states that the injection helped, but still "catching". She is wanting to get her left knee injected today.   HPI  Review of Systems   Objective: Vital Signs: There were no vitals taken for this visit.  Physical Exam Constitutional:      Appearance: She is well-developed.  Eyes:     Pupils: Pupils are equal, round, and reactive to light.  Pulmonary:     Effort: Pulmonary effort is normal.  Skin:    General: Skin is warm and dry.  Neurological:     Mental Status: She is alert and oriented to person, place, and time.  Psychiatric:        Behavior: Behavior normal.     Ortho Exam awake alert and oriented x3.  Appears to be comfortable.  Does use of cane for ambulation  related to her chronic pain and particularly her back.  Left knee had predominately medial joint pain with some patellar crepitation no effusion.  Full extension and flexed over 105 degrees without instability  Specialty Comments:  No specialty comments available.  Imaging: No results found.   PMFS History: Patient Active Problem List   Diagnosis Date Noted  . Pain in right knee 11/17/2019  . Pain in left knee 05/06/2019  . Hashimoto's disease 04/05/2019  . Nausea 11/23/2018  . Frequent falls 09/17/2018  . Skin lesion 09/17/2018  . GERD (gastroesophageal reflux disease)   . Chronic midline low back pain with sciatica 05/27/2018  . Intractable pain 03/20/2018  . Bradycardia   . Chronic pain  syndrome 03/18/2018  . 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 Hypokalemia 03/18/2018  . Intractable back pain 01/03/2018  . HTN (hypertension) 01/03/2018  . Type 2 diabetes mellitus without complication ([place]) XX123456  . Vitamin D deficiency 07/11/2017  . Postmenopausal 04/24/2017  . Microalbuminuria 01/14/2017  . Dyspareunia in female 12/10/2016  . Endometriosis determined by laparoscopy 12/10/2016  . History of exploratory laparotomy 12/10/2016  . Pelvic pain 12/10/2016  . Bipolar 1 disorder ([place]) 09/19/2016  . Cyst of right ovary 09/19/2016  . Severe episode of recurrent major depressive disorder, with psychotic features ([place]) 09/19/2016  . Schizoaffective disorder, bipolar type ([place]) 09/19/2016  . Chronic pain 03/05/2016  . Non morbid obesity due to excess calories 03/05/2016  . S/P lumbar fusion 03/05/2016   Past Medical History:  Diagnosis Date  . Acid reflux   . Bradycardia   . Chronic back pain   . Diabetes mellitus without complication ([place])   . Hypertension   . Hypokalemia     Family History  Problem Relation Age of Onset  . Diabetes Father   . Hypertension Father   . Kidney disease Mother   . Hypertension Mother   . Bipolar disorder Sister   . Schizophrenia Sister   . Neuropathy Sister   . Hypertension Sister   . Bipolar disorder Brother   . Bipolar disorder Brother   . Schizophrenia Brother   . Bipolar disorder Daughter   . Post-traumatic stress disorder Daughter   . Asthma Daughter   . Depression Daughter   . Bipolar disorder Daughter   . Post-traumatic stress disorder Daughter   . Asthma Daughter   . Depression Daughter   . Asthma Daughter   . Depression Daughter   . Asthma Daughter   . Stroke Neg Hx   . Cancer Neg Hx   . CAD Neg Hx     Past Surgical History:  Procedure Laterality Date  . APPENDECTOMY    . BACK SURGERY     x6  . CESAREAN SECTION     x3  . ESOPHAGEAL MANOMETRY N/A 08/25/2019   Procedure: ESOPHAGEAL MANOMETRY (EM);   Surgeon: Carol Ada, MD;  Location: [facility];  Service: Endoscopy;  Laterality: N/A;  . HERNIA REPAIR     Social History   Occupational History  . Not on file  Tobacco Use  . Smoking status: Never Smoker  . Smokeless tobacco: Never Used  Substance and Sexual Activity  . Alcohol use: Not Currently  . Drug use: Never  . Sexual activity: Yes

## 2019-12-15 NOTE — Chronic Care Management (AMB) (Signed)
  Chronic Care Management   Outreach Note  12/15/2019 Name: Linda Mercado MRN: [id number] DOB: 1966/02/26  Referred by: Shelby Mattocks, PA-C Reason for referral : Chronic Care Management ([facility] RN CM  Call )   An unsuccessful telephone outreach was attempted today. The patient was referred to the case management team for assistance with care management and care coordination.   Follow Up Plan: A HIPPA compliant phone message was left for the patient providing contact information and requesting a return call.  Telephone follow up appointment with care management team member scheduled for: 01/12/20  Barb Merino, RN, BSN, CCM  Care Management Coordinator [place] [facility]  Direct Phone: [phone]

## 2019-12-17 ENCOUNTER — Other Ambulatory Visit: Payer: Self-pay | Admitting: Nurse Practitioner

## 2019-12-17 DIAGNOSIS — R110 Nausea: Secondary | ICD-10-CM

## 2019-12-21 ENCOUNTER — Other Ambulatory Visit: Payer: Self-pay

## 2019-12-21 ENCOUNTER — Encounter: Payer: Self-pay | Admitting: Internal Medicine

## 2019-12-21 MED ORDER — ATORVASTATIN CALCIUM 10 MG PO TABS: 10.0000 mg | ORAL_TABLET | Freq: Every day | ORAL | 1 refills | Status: DC

## 2019-12-25 ENCOUNTER — Encounter: Payer: Self-pay | Admitting: Internal Medicine

## 2020-01-03 ENCOUNTER — Encounter: Payer: Self-pay | Admitting: Internal Medicine

## 2020-01-04 ENCOUNTER — Other Ambulatory Visit: Payer: Self-pay

## 2020-01-04 MED ORDER — LOSARTAN POTASSIUM 25 MG PO TABS: 25.0000 mg | ORAL_TABLET | Freq: Every day | ORAL | 0 refills | Status: DC

## 2020-01-04 MED ORDER — HYDROCHLOROTHIAZIDE 12.5 MG PO TABS: 12.5000 mg | ORAL_TABLET | Freq: Every day | ORAL | 0 refills | Status: DC

## 2020-01-12 ENCOUNTER — Telehealth: Payer: Medicare Other

## 2020-01-12 NOTE — Progress Notes (Signed)
This encounter was created in error - please disregard.

## 2020-01-15 ENCOUNTER — Other Ambulatory Visit: Payer: Self-pay | Admitting: Nurse Practitioner

## 2020-01-15 DIAGNOSIS — E119 Type 2 diabetes mellitus without complications: Secondary | ICD-10-CM

## 2020-01-19 ENCOUNTER — Ambulatory Visit: Payer: Medicaid Other | Admitting: Nurse Practitioner

## 2020-01-26 ENCOUNTER — Other Ambulatory Visit: Payer: Self-pay | Admitting: Nurse Practitioner

## 2020-01-26 DIAGNOSIS — R110 Nausea: Secondary | ICD-10-CM

## 2020-01-26 MED ORDER — PROMETHAZINE HCL 25 MG PO TABS: 25.0000 mg | ORAL_TABLET | Freq: Four times a day (QID) | ORAL | 0 refills | Status: DC | PRN

## 2020-02-14 ENCOUNTER — Other Ambulatory Visit: Payer: Self-pay | Admitting: Nurse Practitioner

## 2020-02-16 ENCOUNTER — Ambulatory Visit: Payer: Medicare Other | Admitting: Nurse Practitioner

## 2020-02-22 ENCOUNTER — Other Ambulatory Visit: Payer: Self-pay | Admitting: Internal Medicine

## 2020-02-22 ENCOUNTER — Other Ambulatory Visit: Payer: Self-pay | Admitting: Nurse Practitioner

## 2020-02-22 DIAGNOSIS — R110 Nausea: Secondary | ICD-10-CM

## 2020-02-24 ENCOUNTER — Telehealth: Payer: Medicare Other

## 2020-03-01 ENCOUNTER — Telehealth: Payer: Medicare Other

## 2020-03-01 ENCOUNTER — Ambulatory Visit: Payer: Self-pay

## 2020-03-01 ENCOUNTER — Encounter: Payer: Self-pay | Admitting: Internal Medicine

## 2020-03-01 DIAGNOSIS — G894 Chronic pain syndrome: Secondary | ICD-10-CM

## 2020-03-01 DIAGNOSIS — E119 Type 2 diabetes mellitus without complications: Secondary | ICD-10-CM

## 2020-03-01 DIAGNOSIS — M791 Myalgia, unspecified site: Secondary | ICD-10-CM

## 2020-03-01 DIAGNOSIS — I10 Essential (primary) hypertension: Secondary | ICD-10-CM

## 2020-03-01 DIAGNOSIS — E063 Autoimmune thyroiditis: Secondary | ICD-10-CM

## 2020-03-01 DIAGNOSIS — M5416 Radiculopathy, lumbar region: Secondary | ICD-10-CM

## 2020-03-01 NOTE — Chronic Care Management (AMB) (Signed)
Chronic Care Management   Follow Up Note   03/01/2020 Name: Linda Mercado MRN: [id number] DOB: Aug 20, 1965  Referred by: Linda Mattocks, PA-C Reason for referral : Chronic Care Management (CCM Case Closure)   Linda Mercado is a 54 y.o. year old female who is a primary care patient of [place], Linda Mercado, [place]. The CCM team was consulted for assistance with chronic disease management and care coordination needs.    Review of patient status, including review of consultants reports, relevant laboratory and other test results, and collaboration with appropriate care team members and the patient's provider was performed as part of comprehensive patient evaluation and provision of chronic care management services.    SDOH (Social Determinants of Health) assessments performed: No See Care Plan activities for detailed interventions related to [facility])   The care management team is available to follow up with the patient after provider conversation with the patient regarding recommendation for care management engagement and subsequent re-referral to the care management team.    Outpatient Encounter Medications as of 03/01/2020  Medication Sig  . ACCU-CHEK GUIDE test strip TEST TWICE DAILY AS DIRECTED  . albuterol (VENTOLIN HFA) 108 (90 Base) MCG/ACT inhaler INHALE 2 PUFFS INTO THE LUNGS EVERY 6 HOURS AS NEEDED FOR WHEEZING OR SHORTNESS OF BREATH  . AMITIZA 24 MCG capsule Take 24 mcg by mouth 2 (two) times daily.   [name] atenolol (TENORMIN) 100 MG tablet TAKE 1 TABLET(100 MG) BY MOUTH DAILY  . atorvastatin (LIPITOR) 10 MG tablet Take 1 tablet (10 mg total) by mouth at bedtime.  . Blood Glucose Monitoring Suppl (ACCU-CHEK GUIDE) w/Device KIT USE TO CHECK BLOOD SUGAR TWICE DAILY  . calcium gluconate 500 MG tablet Take 1 tablet (500 mg total) by mouth 3 (three) times daily.  . CAPLYTA 42 MG CAPS Take 42 mg by mouth at bedtime.   . clonazePAM (KLONOPIN) 1 MG tablet Take 1 mg by mouth  3 (three) times daily.  . Cyanocobalamin (B-12) 500 MCG SUBL One SL QD  . DEXILANT 60 MG capsule Take 1 capsule by mouth daily. (Patient taking differently: Take 60 mg by mouth daily. )  . diclofenac Sodium (VOLTAREN) 1 % GEL APPLY 4 GRAMS TO THE AFFECTED AREA FOUR TIMES DAILY  . divalproex (DEPAKOTE ER) 500 MG 24 hr tablet Take 1,000 mg by mouth daily.   [name] docusate sodium (COLACE) 50 MG capsule Take 50 mg by mouth daily.   [name] ELDERBERRY PO Take 1 tablet by mouth at bedtime.  . fluconazole (DIFLUCAN) 100 MG tablet Take 1 tablet (100 mg total) by mouth daily. Take 1 tablet by mouth now repeat in 5 days  . fluticasone (FLOVENT HFA) 110 MCG/ACT inhaler Inhale 2 puffs into the lungs every 12 (twelve) hours.  . hydrochlorothiazide (HYDRODIURIL) 12.5 MG tablet Take 1 tablet (12.5 mg total) by mouth daily.  [name] HYDROmorphone (DILAUDID) 4 MG tablet Take 4 mg by mouth 4 (four) times daily as needed for severe pain.  . hydrOXYzine (ATARAX/VISTARIL) 25 MG tablet Take 25 mg by mouth in the morning, at noon, in the evening, and at bedtime.   . Lancets (ACCU-CHEK MULTICLIX) lancets Use as instructed  . lidocaine (XYLOCAINE) 2 % solution Use as directed 15 mLs in the mouth or throat 3 (three) times a week.   . Lidocaine 4 % PTCH Place 1 patch onto the skin in the morning and at bedtime.  [name] losartan (COZAAR) 25 MG tablet TAKE 1 TABLET(25 MG) BY MOUTH DAILY  . metFORMIN (  GLUCOPHAGE) 500 MG tablet TAKE 1 TABLET(500 MG) BY MOUTH TWICE DAILY WITH A MEAL  . Multiple Vitamin (MULTIVITAMIN WITH MINERALS) TABS tablet Take 1 tablet by mouth daily.   [name] NARCAN 4 MG/0.1ML LIQD nasal spray kit Place 1 spray into the nose as directed.  . Norgestimate-Ethinyl Estradiol Triphasic (TRI-ESTARYLLA) 0.18/0.215/0.25 MG-35 MCG tablet Take 1 tablet by mouth daily.  . ondansetron (ZOFRAN) 4 MG tablet Take 4 mg by mouth as needed for nausea.   [name] PAIN MANAGEMENT INTRATHECAL, IT, PUMP 1 each by Intrathecal route continuous. Intrathecal (IT)  medication: fentanyl, clonidine, bupivicaine  Fentanyl 227m/bupivicaine150mg/clonidine74mg  51.93mper day, 74m61mer day, 1.35m67m5.4ml/55mn  . polyethylene glycol powder (GLYCOLAX/MIRALAX) 17 GM/SCOOP powder MIX 17 GRAMS IN 8 OUNCES OF LIQUID AND DRINK DAILY FOR BOWELS  . promethazine (PHENERGAN) 25 MG tablet TAKE 1 TABLET(25 MG) BY MOUTH EVERY 6 HOURS AS NEEDED FOR NAUSEA OR VOMITING  . QUEtiapine (SEROQUEL XR) 400 MG 24 hr tablet Take 400 mg by mouth 2 (two) times daily.  . QUEtiapine (SEROQUEL) 300 MG tablet Take 300 mg by mouth at bedtime.  . tiZMarland KitchenNidine (ZANAFLEX) 4 MG tablet Take 4 mg by mouth every 4 (four) hours.  . traZODone (DESYREL) 100 MG tablet 5 qhs prescribed by psychiatry (Patient taking differently: Take 100-200 mg by mouth at bedtime. 5 qhs prescribed by psychiatry)  . triamcinolone cream (KENALOG) 0.1 % Apply 1 application topically daily as needed (for itching).  . vitamin C (ASCORBIC ACID) 250 MG tablet Take 250 mg by mouth at bedtime.  . Vitamin D, Ergocalciferol, (DRISDOL) 1.25 MG (50000 UNIT) CAPS capsule TAKE ONE CAPSULE BY MOUTH 2 TIMES A WEEK(Linda AND THURSDAY)  . vortioxetine HBr (TRINTELLIX) 10 MG TABS tablet Take 10 mg by mouth at bedtime.  . VRAMarland KitchenLAR 6 MG CAPS Take 6 mg by mouth daily.    No facility-administered encounter medications on file as of 03/01/2020.     Objective:  Lab Results  Component Value Date   HGBA1C 6.7 (H) 10/12/2019   HGBA1C 6.5 (H) 04/29/2019   HGBA1C 6.5 (H) 09/17/2018   Lab Results  Component Value Date   LDLCALC 48 04/29/2019   CREATININE 0.80 10/06/2019   BP Readings from Last 3 Encounters:  10/28/19 118/80  10/12/19 118/80  10/07/19 125/80    Goals Addressed      Patient Stated   .  COMPLETED: "I may make my aunt my power of attorney" (pt-stated)        Current Barriers:  . KnoMarland Kitchenledge Deficits related to how to complete Advanced directives  . No Advanced Directives in place  Nurse Case Manager Clinical Goal(s):    . OveMarland Kitchen the next 90 days, patient will verbalize understanding of plan for completion of Advanced Directives  goal date extended  CCM RN CM Interventions:  03/01/20 Unable to reach patient after multiple attempts and no return call, patient closed from CCM  Patient Self Care Activities:  . Currently UNABLE TO independently perform self care  Please see past updates related to this goal by clicking on the "Past Updates" button in the selected goal      .  COMPLETED: "My bones hurt" "The pain pump is not helping" (pt-stated)        Current Barriers:  . KnoMarland Kitchenledge Deficits related to unknown etiology for chronic debilitating musculoskeletal and bone Pain  . Chronic Disease Management support and education needs related to Hashimoto's, Chronic Pain Syndrome, DMII, HTN . Lacks caregiver support  Nurse Case Manager  Clinical Goal(s):  [name] Over the next 90 days, patient will work with her Pain management MD to establish a plan of care that will provide her with decreased pain and improved quality of life . Over the next 90 days, patient will discuss getting a referral for the [facility] with her Pain management MD for consideration of a second opinion   CCM RN CM Interventions:  03/01/20 Unable to reach patient after multiple attempts and no return call, patient closed from CCM  Patient Self Care Activities: . Unable to performs ADL's independently . Unable to performs IADL's independentl . Calls provider office for new concerns or questions  Please see past updates related to this goal by clicking on the "Past Updates" button in the selected goal       .  COMPLETED: "to learn more about Hashimoto's" (pt-stated)        Current Barriers:  [name] Knowledge Deficits related to disease process and Self Health management of Hashimoto's thyroiditis  . Lacks caregiver support.  . Chronic Disease Management support and education needs related to Hashimoto's, Chronic Pain Syndrome, DMII, HTN  Nurse  Case Manager Clinical Goal(s):  [name] Over the next 90 days, patient will verbalize basic understanding of Hashimoto's thyroiditis disease process and self health management plan as evidenced by patient will be able to verbalize what Hashimoto's is, how she will Self management this condition and when to call the doctor if needed for symptoms suggestive of thyroiditis   Interventions:  03/01/20 Unable to reach patient after multiple attempts and no return call, patient closed from CCM  Patient Self Care Activities:  . Lacks social connections . Unable to perform ADLs independently . Unable to perform IADLs independently  Initial goal documentation       Plan:   No further follow up required  Barb Merino, RN, BSN, CCM Care Management Coordinator [place]  Direct Phone: [phone]

## 2020-03-22 ENCOUNTER — Other Ambulatory Visit: Payer: Self-pay

## 2020-03-22 ENCOUNTER — Ambulatory Visit: Payer: Medicare Other | Admitting: Orthopaedic Surgery

## 2020-03-22 ENCOUNTER — Other Ambulatory Visit: Payer: Medicare Other | Admitting: Hospice

## 2020-03-22 DIAGNOSIS — G894 Chronic pain syndrome: Secondary | ICD-10-CM

## 2020-03-22 DIAGNOSIS — F250 Schizoaffective disorder, bipolar type: Secondary | ICD-10-CM

## 2020-03-22 DIAGNOSIS — Z515 Encounter for palliative care: Secondary | ICD-10-CM

## 2020-03-22 NOTE — Progress Notes (Signed)
[place] Consult Note Telephone: [phone]  Fax: [phone]  PATIENT NAME: Linda Mercado DOB: 16-Dec-1965 MRN: [id number]  PRIMARY CARE PROVIDER:   Shelby Mattocks, PA-C  REFERRING PROVIDER:  [name], NP Works with PCP RESPONSIBLE PARTY:   OPFY [phone] [address] Lives with God daughter Linda Mercado  [phone] Twin children live at home   TELEHEALTH VISIT STATEMENT Due to the COVID-19 crisis, this visit was done via telephone from my office. It was initiated and consented to by this patient and/or family.  RECOMMENDATIONS/PLAN:   Advance Care Planning/Goals of Care: Telehealth Visit to build trust and follow-up on palliative care.   CODE STATUS: After extensive discussion with patient on the implications and ramifications of CODE STATUS she affirmed that she remains a FULL CODE.  Goals of care:  Goals of care include to maximize quality of life and symptom management. Patient says she has four children. She prefers to be called Linda Mercado with Y not E. She shared her chronic pain status the painful marital life and subsequent divorce. Therapeutic listening provided.  She will prepare telehealth visits to in person visits at this time.  Follow up: Palliative care will continue to follow patient for goals of care clarification and symptom management. Follow-up in 3 months.  Symptom management:  For her chronic pain she says she continues on her pain pump since August 2017; her pain clinic is at [facility].   She endorsed schizoaffective, hallucinating, hearing voices sometimes. She is followed by her psychiatrist and is compliant with medications as prescribed.    She is on Amitza and Miralax; these are helpful for constipation  She said she is on Metformin for Type 2 DM last A1c 10/29/2019 is 6. Patient in no medical acuity. No hospitalizations since last visit. Encouraged ongoing  care. I spent  30 minutes providing this consultation; time includes time spent with patient, chart review and documentation. More than 50% of the time in this consultation was spent on coordinating communication   HISTORY OF PRESENT ILLNESS:  Linda Mercado is a 54 y.o. year old female with multiple medical problems including Schizoaffective disorder, Bipolar 1, Type 2 DM HTN , chronic pain syndrome, Endometriosis determined by laparoscopy. Palliative Care was asked to help address goals of care.   CODE STATUS: Full  PPS: 50% HOSPICE ELIGIBILITY/DIAGNOSIS: TBD  PAST MEDICAL HISTORY:  Past Medical History:  Diagnosis Date  . Acid reflux   . Bradycardia   . Chronic back pain   . Diabetes mellitus without complication ([place])   . Hypertension   . Hypokalemia     SOCIAL HX:  Social History   Tobacco Use  . Smoking status: Never Smoker  . Smokeless tobacco: Never Used  Substance Use Topics  . Alcohol use: Not Currently    ALLERGIES:  Allergies  Allergen Reactions  . Lisinopril Hives    Other reaction(s): anaphylaxis/angioedema  . Latex Hives  . Morphine And Related     Itching, skin get hot.   . Adhesive [Tape] Rash     PERTINENT MEDICATIONS:  Outpatient Encounter Medications as of 03/22/2020  Medication Sig  . ACCU-CHEK GUIDE test strip TEST TWICE DAILY AS DIRECTED  . albuterol (VENTOLIN HFA) 108 (90 Base) MCG/ACT inhaler INHALE 2 PUFFS INTO THE LUNGS EVERY 6 HOURS AS NEEDED FOR WHEEZING OR SHORTNESS OF BREATH  . AMITIZA 24 MCG capsule Take 24 mcg by mouth 2 (  two) times daily.   [name] atenolol (TENORMIN) 100 MG tablet TAKE 1 TABLET(100 MG) BY MOUTH DAILY  . atorvastatin (LIPITOR) 10 MG tablet Take 1 tablet (10 mg total) by mouth at bedtime.  . Blood Glucose Monitoring Suppl (ACCU-CHEK GUIDE) w/Device KIT USE TO CHECK BLOOD SUGAR TWICE DAILY  . calcium gluconate 500 MG tablet Take 1 tablet (500 mg total) by mouth 3 (three) times daily.  . CAPLYTA 42 MG CAPS Take 42 mg by  mouth at bedtime.   . clonazePAM (KLONOPIN) 1 MG tablet Take 1 mg by mouth 3 (three) times daily.  . Cyanocobalamin (B-12) 500 MCG SUBL One SL QD  . DEXILANT 60 MG capsule Take 1 capsule by mouth daily. (Patient taking differently: Take 60 mg by mouth daily. )  . diclofenac Sodium (VOLTAREN) 1 % GEL APPLY 4 GRAMS TO THE AFFECTED AREA FOUR TIMES DAILY  . divalproex (DEPAKOTE ER) 500 MG 24 hr tablet Take 1,000 mg by mouth daily.   [name] docusate sodium (COLACE) 50 MG capsule Take 50 mg by mouth daily.   [name] ELDERBERRY PO Take 1 tablet by mouth at bedtime.  . fluconazole (DIFLUCAN) 100 MG tablet Take 1 tablet (100 mg total) by mouth daily. Take 1 tablet by mouth now repeat in 5 days  . fluticasone (FLOVENT HFA) 110 MCG/ACT inhaler Inhale 2 puffs into the lungs every 12 (twelve) hours.  . hydrochlorothiazide (HYDRODIURIL) 12.5 MG tablet Take 1 tablet (12.5 mg total) by mouth daily.  [name] HYDROmorphone (DILAUDID) 4 MG tablet Take 4 mg by mouth 4 (four) times daily as needed for severe pain.  . hydrOXYzine (ATARAX/VISTARIL) 25 MG tablet Take 25 mg by mouth in the morning, at noon, in the evening, and at bedtime.   . Lancets (ACCU-CHEK MULTICLIX) lancets Use as instructed  . lidocaine (XYLOCAINE) 2 % solution Use as directed 15 mLs in the mouth or throat 3 (three) times a week.   . Lidocaine 4 % PTCH Place 1 patch onto the skin in the morning and at bedtime.  [name] losartan (COZAAR) 25 MG tablet TAKE 1 TABLET(25 MG) BY MOUTH DAILY  . metFORMIN (GLUCOPHAGE) 500 MG tablet TAKE 1 TABLET(500 MG) BY MOUTH TWICE DAILY WITH A MEAL  . Multiple Vitamin (MULTIVITAMIN WITH MINERALS) TABS tablet Take 1 tablet by mouth daily.   [name] NARCAN 4 MG/0.1ML LIQD nasal spray kit Place 1 spray into the nose as directed.  . Norgestimate-Ethinyl Estradiol Triphasic (TRI-ESTARYLLA) 0.18/0.215/0.25 MG-35 MCG tablet Take 1 tablet by mouth daily.  . ondansetron (ZOFRAN) 4 MG tablet Take 4 mg by mouth as needed for nausea.   [name] PAIN MANAGEMENT  INTRATHECAL, IT, PUMP 1 each by Intrathecal route continuous. Intrathecal (IT) medication: fentanyl, clonidine, bupivicaine  Fentanyl 255m/bupivicaine150mg/clonidine68mg  51.948mper day, 68m46mer day, 1.4m84m5.4ml/20mn  . polyethylene glycol powder (GLYCOLAX/MIRALAX) 17 GM/SCOOP powder MIX 17 GRAMS IN 8 OUNCES OF LIQUID AND DRINK DAILY FOR BOWELS  . promethazine (PHENERGAN) 25 MG tablet TAKE 1 TABLET(25 MG) BY MOUTH EVERY 6 HOURS AS NEEDED FOR NAUSEA OR VOMITING  . QUEtiapine (SEROQUEL XR) 400 MG 24 hr tablet Take 400 mg by mouth 2 (two) times daily.  . QUEtiapine (SEROQUEL) 300 MG tablet Take 300 mg by mouth at bedtime.  . tiZMarland KitchenNidine (ZANAFLEX) 4 MG tablet Take 4 mg by mouth every 4 (four) hours.  . traZODone (DESYREL) 100 MG tablet 5 qhs prescribed by psychiatry (Patient taking differently: Take 100-200 mg by mouth at bedtime. 5 qhs prescribed by psychiatry)  .  triamcinolone cream (KENALOG) 0.1 % Apply 1 application topically daily as needed (for itching).  . vitamin C (ASCORBIC ACID) 250 MG tablet Take 250 mg by mouth at bedtime.  . Vitamin D, Ergocalciferol, (DRISDOL) 1.25 MG (50000 UNIT) CAPS capsule TAKE ONE CAPSULE BY MOUTH 2 TIMES A WEEK(SUNDAY AND THURSDAY)  . vortioxetine HBr (TRINTELLIX) 10 MG TABS tablet Take 10 mg by mouth at bedtime.  [name] VRAYLAR 6 MG CAPS Take 6 mg by mouth daily.    No facility-administered encounter medications on file as of 03/22/2020.   Teodoro Spray, NP

## 2020-03-26 ENCOUNTER — Other Ambulatory Visit: Payer: Self-pay | Admitting: Nurse Practitioner

## 2020-03-26 DIAGNOSIS — R110 Nausea: Secondary | ICD-10-CM

## 2020-04-10 ENCOUNTER — Ambulatory Visit: Payer: Medicare Other | Admitting: Nurse Practitioner

## 2020-04-19 ENCOUNTER — Telehealth: Payer: Self-pay

## 2020-04-19 ENCOUNTER — Telehealth: Payer: Medicare Other

## 2020-04-19 NOTE — Telephone Encounter (Cosign Needed)
  Chronic Care Management   Outreach Note  04/19/2020 Name: Linda Mercado MRN: [id number] DOB: September 06, 1965  Referred by: Shelby Mattocks, PA-C Reason for referral : Care Coordination (Inbound call from patient)  Voice message received from patient requesting a return call. An unsuccessful telephone outreach was attempted today. The patient was referred to the case management team for assistance with care management and care coordination. Ms. Tecson was closed from CCM in August 2021 due to not being able to reach her for ongoing engagement.   Follow Up Plan: A HIPAA compliant phone message was left for the patient providing contact information and requesting a return call.   Barb Merino, RN, BSN, CCM Care Management Coordinator [place] [facility]  Direct Phone: [phone]

## 2020-04-21 ENCOUNTER — Other Ambulatory Visit: Payer: Self-pay | Admitting: Nurse Practitioner

## 2020-04-21 DIAGNOSIS — E119 Type 2 diabetes mellitus without complications: Secondary | ICD-10-CM

## 2020-04-27 ENCOUNTER — Other Ambulatory Visit: Payer: Self-pay

## 2020-04-27 ENCOUNTER — Telehealth: Payer: Medicare Other

## 2020-04-27 ENCOUNTER — Ambulatory Visit: Payer: Self-pay

## 2020-04-27 DIAGNOSIS — E063 Autoimmune thyroiditis: Secondary | ICD-10-CM

## 2020-04-27 DIAGNOSIS — G894 Chronic pain syndrome: Secondary | ICD-10-CM

## 2020-04-27 DIAGNOSIS — M5416 Radiculopathy, lumbar region: Secondary | ICD-10-CM

## 2020-04-27 DIAGNOSIS — I10 Essential (primary) hypertension: Secondary | ICD-10-CM

## 2020-04-27 DIAGNOSIS — E119 Type 2 diabetes mellitus without complications: Secondary | ICD-10-CM

## 2020-04-28 ENCOUNTER — Telehealth: Payer: Self-pay

## 2020-04-28 ENCOUNTER — Telehealth: Payer: Medicare Other

## 2020-04-28 NOTE — Telephone Encounter (Signed)
  Chronic Care Management   Outreach Note  04/28/2020 Name: Linda Mercado MRN: [id number] DOB: Feb 17, 1966  Referred by: Shelby Mattocks, PA-C Reason for referral : Care Coordination   An unsuccessful telephone outreach was attempted today. The patient was referred to the case management team for assistance with care management and care coordination.   Follow Up Plan: A HIPAA compliant phone message was left for the patient providing contact information and requesting a return call.  The care management team will reach out to the patient again over the next 10 days.   Daneen Schick, BSW, CDP Social Worker, Certified Dementia Practitioner [facility] / [facility] [phone]

## 2020-05-02 NOTE — Patient Instructions (Signed)
Visit Information  Goals Addressed      Patient Stated   .  "I need help at home" (pt-stated)        CARE PLAN ENTRY (see longitudinal plan of care for additional care plan information)  Current Barriers:  [name] Knowledge Deficits related to resources for custodial care needs, home delivered meals and transportation  . Chronic Disease Management support and education needs related to Type 2 diabetes Mellitus, Essential hypertension, Chronic pain syndrome, Hashimoto's disease, Lumbar radiculopathy . Lacks caregiver support . Transportation barriers  Nurse Case Manager Clinical Goal(s):  [name] Over the next 90 days, patient will work with the embedded BSW Daneen Schick to address needs related to caregiver assistance including custodial care, home delivered meals and transportation  CCM RN CM Interventions:  04/27/20 call completed with patient  . Inter-disciplinary care team collaboration (see longitudinal plan of care) . Evaluation of current treatment plan related to level of care concerns and patient's adherence to plan as established by provider . Determined patient is requesting assistance with learning more about caregiver assistance including custodial care and home delivered meals . Discussed patient has dual coverage and her live in [place] is no longer living with her or available to assist with care needs or transportation . Collaborated with embedded BSW Kendra regarding patients request for assistance with custodial care, home delivered meals and transportation  . Discussed plans with patient for ongoing care management follow up and provided patient with direct contact information for care management team  Patient Self Care Activities:  . Self administers medications as prescribed . Attends all scheduled provider appointments . Calls pharmacy for medication refills . Calls provider office for new concerns or questions . Lacks social connections . Unable to perform ADLs  independently . Unable to perform IADLs independently  Initial goal documentation     .  "to get my pain under better control" (pt-stated)        CARE PLAN ENTRY (see longitudinal plan of care for additional care plan information)  Current Barriers:  [name] Knowledge Deficits related to disease process and Self Health management of Chronic Pain  . Chronic Disease Management support and education needs related to Type 2 diabetes Mellitus, Essential hypertension, Chronic pain syndrome, Hashimoto's disease, Lumbar radiculopathy . Lacks caregiver support . Transportation barriers  Nurse Case Manager Clinical Goal(s):  [name] Over the next 180 days, patient will work with the CCM team and PCP to address needs related to disease education and support for improved Self Health management of Chronic Pain Syndrome   CCM RN CM Interventions:  04/27/20 call completed with patient  . Inter-disciplinary care team collaboration (see longitudinal plan of care) . Evaluation of current treatment plan related to Chronic Pain Syndrome and patient's adherence to plan as established by provider . Determined patient continues to have her Chronic pain managed by Dr. Cherly Anderson with [facility] with her last pump refill completed on 04/12/20 . Determined Dr. Cherly Anderson will contact Ms. Sundstrom to let her know if her implanted pain pump can be surgically manipulated to a new position/placement due to patient states the pain pump has "flipped upside down" and is causing her more pain and discomfort . Reviewed medications with patient and discussed patient's current intrathecal pain regimen is as follows:                 [name] Bupivacaine PF 2 mg/ml, Clonidine PF 150 mcg/ml, Fentanyl 100 mcg/ml TV 20 ml for Intrathecal Intrathecal ONCE   .  Discussed plans with patient for ongoing care management follow up and provided patient with direct contact information for care management team  Patient Self Care Activities:  . Self administers  medications as prescribed . Attends all scheduled provider appointments . Calls pharmacy for medication refills . Calls provider office for new concerns or questions . Lacks social connections . Unable to perform ADLs independently . Unable to perform IADLs independently  Initial goal documentation       Patient verbalizes understanding of instructions provided today.   Telephone follow up appointment with care management team member scheduled for: 05/22/20  Barb Merino, RN, BSN, CCM Care Management Coordinator [facility]  Direct Phone: [phone]

## 2020-05-02 NOTE — Chronic Care Management (AMB) (Signed)
Chronic Care Management   Initial Visit Note  04/27/2020 Name: Linda Mercado MRN: [id number] DOB: 07/17/1966  Referred by: Rodriguez-Southworth, Sylvia, PA-C Reason for referral : Chronic Care Management (Inbound call from patient )   Linda Mercado is a 54 y.o. year old female who is a primary care patient of Rodriguez-Southworth, Sylvia, PA-C. The CCM team was consulted for assistance with chronic disease management and care coordination needs related to HTN, DMII and Hashimoto's, Chronic Pain Syndrome  Review of patient status, including review of consultants reports, relevant laboratory and other test results, and collaboration with appropriate care team members and the patient's provider was performed as part of comprehensive patient evaluation and provision of chronic care management services.    SDOH (Social Determinants of Health) assessments performed: Yes See Care Plan activities for detailed interventions related to SDOH   Inbound call received from patient concerning her need for caregiver assistance, home delivered meals and transportation.     Medications: Outpatient Encounter Medications as of 04/27/2020  Medication Sig  . ACCU-CHEK GUIDE test strip TEST TWICE DAILY AS DIRECTED  . albuterol (VENTOLIN HFA) 108 (90 Base) MCG/ACT inhaler INHALE 2 PUFFS INTO THE LUNGS EVERY 6 HOURS AS NEEDED FOR WHEEZING OR SHORTNESS OF BREATH  . AMITIZA 24 MCG capsule Take 24 mcg by mouth 2 (two) times daily.   . atenolol (TENORMIN) 100 MG tablet TAKE 1 TABLET(100 MG) BY MOUTH DAILY  . atorvastatin (LIPITOR) 10 MG tablet Take 1 tablet (10 mg total) by mouth at bedtime.  . Blood Glucose Monitoring Suppl (ACCU-CHEK GUIDE) w/Device KIT USE TO CHECK BLOOD SUGAR TWICE DAILY  . calcium gluconate 500 MG tablet Take 1 tablet (500 mg total) by mouth 3 (three) times daily.  . CAPLYTA 42 MG CAPS Take 42 mg by mouth at bedtime.   . clonazePAM (KLONOPIN) 1 MG tablet Take 1 mg by mouth 3 (three) times  daily.  . Cyanocobalamin (B-12) 500 MCG SUBL One SL QD  . DEXILANT 60 MG capsule Take 1 capsule by mouth daily. (Patient taking differently: Take 60 mg by mouth daily. )  . diclofenac Sodium (VOLTAREN) 1 % GEL APPLY 4 GRAMS TO THE AFFECTED AREA FOUR TIMES DAILY  . divalproex (DEPAKOTE ER) 500 MG 24 hr tablet Take 1,000 mg by mouth daily.   . docusate sodium (COLACE) 50 MG capsule Take 50 mg by mouth daily.   . ELDERBERRY PO Take 1 tablet by mouth at bedtime.  . fluconazole (DIFLUCAN) 100 MG tablet Take 1 tablet (100 mg total) by mouth daily. Take 1 tablet by mouth now repeat in 5 days  . fluticasone (FLOVENT HFA) 110 MCG/ACT inhaler Inhale 2 puffs into the lungs every 12 (twelve) hours.  . hydrochlorothiazide (HYDRODIURIL) 12.5 MG tablet Take 1 tablet (12.5 mg total) by mouth daily.  . HYDROmorphone (DILAUDID) 4 MG tablet Take 4 mg by mouth 4 (four) times daily as needed for severe pain.  . hydrOXYzine (ATARAX/VISTARIL) 25 MG tablet Take 25 mg by mouth in the morning, at noon, in the evening, and at bedtime.   . Lancets (ACCU-CHEK MULTICLIX) lancets Use as instructed  . lidocaine (XYLOCAINE) 2 % solution Use as directed 15 mLs in the mouth or throat 3 (three) times a week.   . Lidocaine 4 % PTCH Place 1 patch onto the skin in the morning and at bedtime.  . losartan (COZAAR) 25 MG tablet TAKE 1 TABLET(25 MG) BY MOUTH DAILY  . metFORMIN (GLUCOPHAGE) 500 MG tablet TAKE 1   TABLET(500 MG) BY MOUTH TWICE DAILY WITH A MEAL  . Multiple Vitamin (MULTIVITAMIN WITH MINERALS) TABS tablet Take 1 tablet by mouth daily.   . NARCAN 4 MG/0.1ML LIQD nasal spray kit Place 1 spray into the nose as directed.  . Norgestimate-Ethinyl Estradiol Triphasic (TRI-ESTARYLLA) 0.18/0.215/0.25 MG-35 MCG tablet Take 1 tablet by mouth daily.  . ondansetron (ZOFRAN) 4 MG tablet Take 4 mg by mouth as needed for nausea.   . PAIN MANAGEMENT INTRATHECAL, IT, PUMP 1 each by Intrathecal route continuous. Intrathecal (IT) medication:  fentanyl, clonidine, bupivicaine  Fentanyl 250mg/bupivicaine150mg/clonidine2mg  51.97mg per day, 2mg per day, 1.44mcg 15.4ml/qmin  . polyethylene glycol powder (GLYCOLAX/MIRALAX) 17 GM/SCOOP powder MIX 17 GRAMS IN 8 OUNCES OF LIQUID AND DRINK DAILY FOR BOWELS  . promethazine (PHENERGAN) 25 MG tablet TAKE 1 TABLET(25 MG) BY MOUTH EVERY 6 HOURS AS NEEDED FOR NAUSEA OR VOMITING  . QUEtiapine (SEROQUEL XR) 400 MG 24 hr tablet Take 400 mg by mouth 2 (two) times daily.  . QUEtiapine (SEROQUEL) 300 MG tablet Take 300 mg by mouth at bedtime.  . tiZANidine (ZANAFLEX) 4 MG tablet Take 4 mg by mouth every 4 (four) hours.  . traZODone (DESYREL) 100 MG tablet 5 qhs prescribed by psychiatry (Patient taking differently: Take 100-200 mg by mouth at bedtime. 5 qhs prescribed by psychiatry)  . triamcinolone cream (KENALOG) 0.1 % Apply 1 application topically daily as needed (for itching).  . vitamin C (ASCORBIC ACID) 250 MG tablet Take 250 mg by mouth at bedtime.  . Vitamin D, Ergocalciferol, (DRISDOL) 1.25 MG (50000 UNIT) CAPS capsule TAKE ONE CAPSULE BY MOUTH 2 TIMES A WEEK(SUNDAY AND THURSDAY)  . vortioxetine HBr (TRINTELLIX) 10 MG TABS tablet Take 10 mg by mouth at bedtime.  . VRAYLAR 6 MG CAPS Take 6 mg by mouth daily.    No facility-administered encounter medications on file as of 04/27/2020.     Objective:  Lab Results  Component Value Date   HGBA1C 6.7 (H) 10/12/2019   HGBA1C 6.5 (H) 04/29/2019   HGBA1C 6.5 (H) 09/17/2018   Lab Results  Component Value Date   LDLCALC 48 04/29/2019   CREATININE 0.80 10/06/2019   BP Readings from Last 3 Encounters:  10/28/19 118/80  10/12/19 118/80  10/07/19 125/80    Goals Addressed      Patient Stated   .  "I need help at home" (pt-stated)        CARE PLAN ENTRY (see longitudinal plan of care for additional care plan information)  Current Barriers:  . Knowledge Deficits related to resources for custodial care needs, home delivered meals and  transportation  . Chronic Disease Management support and education needs related to Type 2 diabetes Mellitus, Essential hypertension, Chronic pain syndrome, Hashimoto's disease, Lumbar radiculopathy . Lacks caregiver support . Transportation barriers  Nurse Case Manager Clinical Goal(s):  . Over the next 90 days, patient will work with the embedded BSW Kendra Humble to address needs related to caregiver assistance including custodial care, home delivered meals and transportation  CCM RN CM Interventions:  04/27/20 call completed with patient  . Inter-disciplinary care team collaboration (see longitudinal plan of care) . Evaluation of current treatment plan related to level of care concerns and patient's adherence to plan as established by provider . Determined patient is requesting assistance with learning more about caregiver assistance including custodial care and home delivered meals . Discussed patient has dual coverage and her live in Goddaughter is no longer living with her or available to assist   with care needs or transportation . Collaborated with embedded BSW Kendra regarding patients request for assistance with custodial care, home delivered meals and transportation  . Discussed plans with patient for ongoing care management follow up and provided patient with direct contact information for care management team  Patient Self Care Activities:  . Self administers medications as prescribed . Attends all scheduled provider appointments . Calls pharmacy for medication refills . Calls provider office for new concerns or questions . Lacks social connections . Unable to perform ADLs independently . Unable to perform IADLs independently  Initial goal documentation     .  "to get my pain under better control" (pt-stated)        CARE PLAN ENTRY (see longitudinal plan of care for additional care plan information)  Current Barriers:  [name] Knowledge Deficits related to disease process and  Self Health management of Chronic Pain  . Chronic Disease Management support and education needs related to Type 2 diabetes Mellitus, Essential hypertension, Chronic pain syndrome, Hashimoto's disease, Lumbar radiculopathy . Lacks caregiver support . Transportation barriers  Nurse Case Manager Clinical Goal(s):  [name] Over the next 180 days, patient will work with the CCM team and PCP to address needs related to disease education and support for improved Self Health management of Chronic Pain Syndrome   CCM RN CM Interventions:  04/27/20 call completed with patient  . Inter-disciplinary care team collaboration (see longitudinal plan of care) . Evaluation of current treatment plan related to Chronic Pain Syndrome and patient's adherence to plan as established by provider . Determined patient continues to have her Chronic pain managed by Dr. Cherly Anderson with [facility] with her last pump refill completed on 04/12/20 . Determined Dr. Cherly Anderson will contact Ms. Assad to let her know if her implanted pain pump can be surgically manipulated to a new position/placement due to patient states the pain pump has "flipped upside down" and is causing her more pain and discomfort . Reviewed medications with patient and discussed patient's current intrathecal pain regimen is as follows:                 [name] Bupivacaine PF 2 mg/ml, Clonidine PF 150 mcg/ml, Fentanyl 100 mcg/ml TV 20 ml for Intrathecal Intrathecal ONCE   . Discussed plans with patient for ongoing care management follow up and provided patient with direct contact information for care management team  Patient Self Care Activities:  . Self administers medications as prescribed . Attends all scheduled provider appointments . Calls pharmacy for medication refills . Calls provider office for new concerns or questions . Lacks social connections . Unable to perform ADLs independently . Unable to perform IADLs independently  Initial goal documentation          Ms. Hulsebus was given information about Chronic Care Management services today including:  1. CCM service includes personalized support from designated clinical staff supervised by her physician, including individualized plan of care and coordination with other care providers 2. 24/7 contact phone numbers for assistance for urgent and routine care needs. 3. Service will only be billed when office clinical staff spend 20 minutes or more in a month to coordinate care. 4. Only one practitioner may furnish and bill the service in a calendar month. 5. The patient may stop CCM services at any time (effective at the end of the month) by phone call to the office staff. 6. The patient will be responsible for cost sharing (co-pay) of up to 20% of the service fee (  after annual deductible is met).  Patient agreed to services and verbal consent obtained.   Plan:   Telephone follow up appointment with care management team member scheduled for: 05/22/20   , RN, BSN, CCM Care Management Coordinator [facility]  Direct Phone: [phone]     

## 2020-05-05 ENCOUNTER — Telehealth: Payer: Self-pay

## 2020-05-05 ENCOUNTER — Telehealth: Payer: Medicare Other

## 2020-05-05 NOTE — Telephone Encounter (Signed)
  Chronic Care Management   Outreach Note  05/05/2020 Name: Linda Mercado MRN: [id number] DOB: Aug 21, 1965  Referred by: Shelby Mattocks, PA-C Reason for referral : Care Coordination   A second unsuccessful telephone outreach was attempted today. The patient was referred to the case management team for assistance with care management and care coordination.   Follow Up Plan: A HIPAA compliant phone message was left for the patient providing contact information and requesting a return call.  The care management team will reach out to the patient again over the next 21 days.   Daneen Schick, BSW, CDP Social Worker, Certified Dementia Practitioner [place] [facility] [place] [facility] [phone]

## 2020-05-07 ENCOUNTER — Other Ambulatory Visit: Payer: Self-pay | Admitting: Nurse Practitioner

## 2020-05-11 ENCOUNTER — Encounter: Payer: Medicare Other | Admitting: Internal Medicine

## 2020-05-12 DIAGNOSIS — G894 Chronic pain syndrome: Secondary | ICD-10-CM | POA: Diagnosis not present

## 2020-05-18 ENCOUNTER — Encounter: Payer: Medicare Other | Admitting: Internal Medicine

## 2020-05-18 ENCOUNTER — Ambulatory Visit: Payer: Medicare Other

## 2020-05-18 ENCOUNTER — Telehealth: Payer: Self-pay

## 2020-05-18 ENCOUNTER — Encounter: Payer: Self-pay | Admitting: Nurse Practitioner

## 2020-05-18 NOTE — Telephone Encounter (Signed)
This nurse attempted to call patient in order to perform scheduled telephonic AWV. Called three times at 1145, 1150, and 1200. Message left for patient to call back in order to reschedule for another time.

## 2020-05-20 ENCOUNTER — Other Ambulatory Visit: Payer: Self-pay | Admitting: Nurse Practitioner

## 2020-05-22 ENCOUNTER — Telehealth: Payer: Self-pay

## 2020-05-22 ENCOUNTER — Other Ambulatory Visit: Payer: Self-pay

## 2020-05-22 ENCOUNTER — Ambulatory Visit (INDEPENDENT_AMBULATORY_CARE_PROVIDER_SITE_OTHER): Payer: Medicare Other | Admitting: Nurse Practitioner

## 2020-05-22 ENCOUNTER — Other Ambulatory Visit: Payer: Self-pay | Admitting: Nurse Practitioner

## 2020-05-22 ENCOUNTER — Telehealth: Payer: Medicare Other

## 2020-05-22 ENCOUNTER — Encounter: Payer: Self-pay | Admitting: Nurse Practitioner

## 2020-05-22 VITALS — BP 120/76 | HR 68 | Temp 98.7°F | Ht 65.6 in | Wt 169.8 lb

## 2020-05-22 DIAGNOSIS — Z79899 Other long term (current) drug therapy: Secondary | ICD-10-CM | POA: Diagnosis not present

## 2020-05-22 DIAGNOSIS — Z1231 Encounter for screening mammogram for malignant neoplasm of breast: Secondary | ICD-10-CM

## 2020-05-22 DIAGNOSIS — R110 Nausea: Secondary | ICD-10-CM

## 2020-05-22 DIAGNOSIS — E119 Type 2 diabetes mellitus without complications: Secondary | ICD-10-CM

## 2020-05-22 DIAGNOSIS — G8929 Other chronic pain: Secondary | ICD-10-CM

## 2020-05-22 DIAGNOSIS — I10 Essential (primary) hypertension: Secondary | ICD-10-CM

## 2020-05-22 DIAGNOSIS — E559 Vitamin D deficiency, unspecified: Secondary | ICD-10-CM

## 2020-05-22 DIAGNOSIS — Z1159 Encounter for screening for other viral diseases: Secondary | ICD-10-CM

## 2020-05-22 DIAGNOSIS — F250 Schizoaffective disorder, bipolar type: Secondary | ICD-10-CM

## 2020-05-22 MED ORDER — ATORVASTATIN CALCIUM 10 MG PO TABS: 10.0000 mg | ORAL_TABLET | Freq: Every day | ORAL | 1 refills | Status: DC

## 2020-05-22 MED ORDER — LOSARTAN POTASSIUM 25 MG PO TABS
25.0000 mg | ORAL_TABLET | Freq: Every day | ORAL | 1 refills | Status: DC
Start: 2020-05-22 — End: 2020-08-31

## 2020-05-22 MED ORDER — B-12 500 MCG SL SUBL
SUBLINGUAL_TABLET | SUBLINGUAL | 1 refills | Status: DC
Start: 2020-05-22 — End: 2021-10-11

## 2020-05-22 MED ORDER — ATENOLOL 100 MG PO TABS: 100.0000 mg | ORAL_TABLET | Freq: Every day | ORAL | 1 refills | Status: DC

## 2020-05-22 MED ORDER — LIDOCAINE 4 % EX PTCH
1.0000 | MEDICATED_PATCH | Freq: Two times a day (BID) | CUTANEOUS | 5 refills | Status: DC
Start: 2020-05-22 — End: 2023-05-21

## 2020-05-22 MED ORDER — PROMETHAZINE HCL 25 MG PO TABS: 25.0000 mg | ORAL_TABLET | Freq: Four times a day (QID) | ORAL | 0 refills | Status: DC | PRN

## 2020-05-22 MED ORDER — CALCIUM GLUCONATE 500 MG PO TABS
500.0000 mg | ORAL_TABLET | Freq: Three times a day (TID) | ORAL | 1 refills | Status: DC
Start: 2020-05-22 — End: 2021-01-30

## 2020-05-22 MED ORDER — DICLOFENAC SODIUM 1 % EX GEL: CUTANEOUS | 1 refills | Status: AC

## 2020-05-22 MED ORDER — FLUTICASONE PROPIONATE HFA 110 MCG/ACT IN AERO
2.0000 | INHALATION_SPRAY | Freq: Two times a day (BID) | RESPIRATORY_TRACT | 1 refills | Status: DC
Start: 2020-05-22 — End: 2020-11-23

## 2020-05-22 MED ORDER — METFORMIN HCL 500 MG PO TABS: 500.0000 mg | ORAL_TABLET | Freq: Two times a day (BID) | ORAL | 1 refills | Status: DC

## 2020-05-22 MED ORDER — NORGESTIM-ETH ESTRAD TRIPHASIC 0.18/0.215/0.25 MG-35 MCG PO TABS: 1.0000 | ORAL_TABLET | Freq: Every day | ORAL | 1 refills | Status: DC

## 2020-05-22 NOTE — Telephone Encounter (Signed)
  Chronic Care Management   Outreach Note  05/22/2020 Name: Linda Mercado MRN: [id number] DOB: 12-19-1965  Referred by: [name], FNP Reason for referral : Care Coordination   Third unsuccessful telephone outreach was attempted today. The patient was referred to the case management team for assistance with care management and care coordination. The patient's primary care provider has been notified of our unsuccessful attempts to make or maintain contact with the patient. The care management team is pleased to engage with this patient at any time in the future should he/she be interested in assistance from the care management team.   Follow Up Plan: A HIPAA compliant phone message was left for the patient providing contact information and requesting a return call.  The patient will remain active with RN Care Manager. SW will remain available as a member of the patients care team.  Daneen Schick, BSW, CDP Social Worker, Certified Dementia Practitioner [place] [facility] [place] [facility] [phone]

## 2020-05-22 NOTE — Telephone Encounter (Cosign Needed)
  Chronic Care Management   Outreach Note  05/22/2020 Name: Linda Mercado MRN: [id number] DOB: [birth year]  Referred by: [name], FNP Reason for referral : Chronic Care Management (Inbound Call from patient )   An unsuccessful telephone outreach was attempted today. The patient was referred to the case management team for assistance with care management and care coordination.   Follow Up Plan: Telephone follow up appointment with care management team member scheduled for: 05/25/20  Barb Merino, RN, BSN, CCM Care Management Coordinator [place] [facility]  Direct Phone: [phone]

## 2020-05-22 NOTE — Progress Notes (Signed)
I,Yamilka Roman [employer] as a Education administrator for [employer], FNP.,have documented all relevant documentation on the behalf of [name], FNP,as directed by  [name], FNP while in the presence of [name], [place]. This visit occurred during the SARS-CoV-2 public health emergency.  Safety protocols were in place, including screening questions prior to the visit, additional usage of staff PPE, and extensive cleaning of exam room while observing appropriate contact time as indicated for disinfecting solutions.  Subjective:     Patient ID: Linda Mercado , female    DOB: [birth year] , 54 y.o.   MRN: [id number]   Chief Complaint  Patient presents with  . Diabetes  . Hypertension    HPI  HPI   Past Medical History:  Diagnosis Date  . Acid reflux   . Bradycardia   . Chronic back pain   . Diabetes mellitus without complication ([place])   . Hypertension   . Hypokalemia      Family History  Problem Relation Age of Onset  . Diabetes Father   . Hypertension Father   . Kidney disease Mother   . Hypertension Mother   . Bipolar disorder Sister   . Schizophrenia Sister   . Neuropathy Sister   . Hypertension Sister   . Bipolar disorder Brother   . Bipolar disorder Brother   . Schizophrenia Brother   . Bipolar disorder Daughter   . Post-traumatic stress disorder Daughter   . Asthma Daughter   . Depression Daughter   . Bipolar disorder Daughter   . Post-traumatic stress disorder Daughter   . Asthma Daughter   . Depression Daughter   . Asthma Daughter   . Depression Daughter   . Asthma Daughter   . Stroke Neg Hx   . Cancer Neg Hx   . CAD Neg Hx      Current Outpatient Medications:  .  ACCU-CHEK GUIDE test strip, TEST TWICE DAILY AS DIRECTED, Disp: 100 strip, Rfl: 2 .  albuterol (VENTOLIN HFA) 108 (90 Base) MCG/ACT inhaler, INHALE 2 PUFFS INTO THE LUNGS EVERY 6 HOURS AS NEEDED FOR WHEEZING OR SHORTNESS OF BREATH, Disp: 20.1 g, Rfl: 1 .  AMITIZA 24 MCG capsule, Take 24 mcg  by mouth 2 (two) times daily. , Disp: , Rfl:  .  atenolol (TENORMIN) 100 MG tablet, Take 1 tablet (100 mg total) by mouth daily., Disp: 90 tablet, Rfl: 1 .  atorvastatin (LIPITOR) 10 MG tablet, Take 1 tablet (10 mg total) by mouth at bedtime., Disp: 90 tablet, Rfl: 1 .  Blood Glucose Monitoring Suppl (ACCU-CHEK GUIDE) w/Device KIT, USE TO CHECK BLOOD SUGAR TWICE DAILY, Disp: 1 kit, Rfl: 3 .  calcium gluconate 500 MG tablet, Take 1 tablet (500 mg total) by mouth 3 (three) times daily., Disp: 270 tablet, Rfl: 1 .  CAPLYTA 42 MG CAPS, Take 42 mg by mouth at bedtime. , Disp: , Rfl:  .  clonazePAM (KLONOPIN) 1 MG tablet, Take 1 mg by mouth 3 (three) times daily., Disp: , Rfl:  .  Cyanocobalamin (B-12) 500 MCG SUBL, One SL QD, Disp: 90 tablet, Rfl: 1 .  DEXILANT 60 MG capsule, Take 1 capsule by mouth daily. (Patient taking differently: Take 60 mg by mouth daily. ), Disp: 90 capsule, Rfl: 0 .  diclofenac Sodium (VOLTAREN) 1 % GEL, APPLY 4 GRAMS TO THE AFFECTED AREA FOUR TIMES DAILY, Disp: 400 g, Rfl: 1 .  docusate sodium (COLACE) 50 MG capsule, Take 50 mg by mouth daily. , Disp: , Rfl:  .  ELDERBERRY PO, Take 1 tablet by mouth at bedtime., Disp: , Rfl:  .  fluticasone (FLOVENT HFA) 110 MCG/ACT inhaler, Inhale 2 puffs into the lungs every 12 (twelve) hours., Disp: 3 each, Rfl: 1 .  hydrochlorothiazide (HYDRODIURIL) 12.5 MG tablet, TAKE 1 TABLET(12.5 MG) BY MOUTH DAILY, Disp: 90 tablet, Rfl: 0 .  HYDROmorphone (DILAUDID) 4 MG tablet, Take 4 mg by mouth 4 (four) times daily as needed for severe pain., Disp: , Rfl:  .  hydrOXYzine (ATARAX/VISTARIL) 25 MG tablet, Take 25 mg by mouth in the morning, at noon, in the evening, and at bedtime. , Disp: , Rfl:  .  Lancets (ACCU-CHEK MULTICLIX) lancets, Use as instructed, Disp: 100 each, Rfl: 12 .  lidocaine (XYLOCAINE) 2 % solution, Use as directed 15 mLs in the mouth or throat 3 (three) times a week. , Disp: , Rfl:  .  Lidocaine 4 % PTCH, Place 1 patch onto the skin  in the morning and at bedtime., Disp: 60 patch, Rfl: 5 .  LORazepam (ATIVAN) 2 MG tablet, Take 2 mg by mouth 4 (four) times daily as needed for anxiety. , Disp: , Rfl:  .  losartan (COZAAR) 25 MG tablet, Take 1 tablet (25 mg total) by mouth daily., Disp: 90 tablet, Rfl: 1 .  metFORMIN (GLUCOPHAGE) 500 MG tablet, Take 1 tablet (500 mg total) by mouth 2 (two) times daily with a meal., Disp: 180 tablet, Rfl: 1 .  Multiple Vitamin (MULTIVITAMIN WITH MINERALS) TABS tablet, Take 1 tablet by mouth daily. , Disp: , Rfl:  .  NARCAN 4 MG/0.1ML LIQD nasal spray kit, Place 1 spray into the nose as directed., Disp: , Rfl: 0 .  Norgestimate-Ethinyl Estradiol Triphasic (TRI-ESTARYLLA) 0.18/0.215/0.25 MG-35 MCG tablet, Take 1 tablet by mouth daily., Disp: 90 tablet, Rfl: 1 .  ondansetron (ZOFRAN) 4 MG tablet, Take 4 mg by mouth as needed for nausea. , Disp: , Rfl:  .  PAIN MANAGEMENT INTRATHECAL, IT, PUMP, 1 each by Intrathecal route continuous. Intrathecal (IT) medication: fentanyl, clonidine, bupivicaine  Fentanyl 259m/bupivicaine150mg/clonidine57mg  51.939mper day, 57m91mer day, 1.7m59m5.4ml/39mn, Disp: , Rfl:  .  polyethylene glycol powder (GLYCOLAX/MIRALAX) 17 GM/SCOOP powder, MIX 17 GRAMS IN 8 OUNCES OF LIQUID AND DRINK DAILY FOR BOWELS, Disp: 238 g, Rfl: 1 .  promethazine (PHENERGAN) 25 MG tablet, Take 1 tablet (25 mg total) by mouth every 6 (six) hours as needed for nausea or vomiting., Disp: 90 tablet, Rfl: 0 .  QUEtiapine (SEROQUEL XR) 400 MG 24 hr tablet, Take 400 mg by mouth 2 (two) times daily., Disp: , Rfl:  .  QUEtiapine (SEROQUEL) 300 MG tablet, Take 300 mg by mouth at bedtime., Disp: , Rfl:  .  tiZANidine (ZANAFLEX) 4 MG tablet, Take 4 mg by mouth every 4 (four) hours., Disp: , Rfl:  .  traZODone (DESYREL) 100 MG tablet, 5 qhs prescribed by psychiatry (Patient taking differently: Take 100-200 mg by mouth at bedtime. 5 qhs prescribed by psychiatry), Disp: 30 tablet, Rfl: 0 .  triamcinolone cream  (KENALOG) 0.1 %, Apply 1 application topically daily as needed (for itching)., Disp: 30 g, Rfl: 2 .  vitamin C (ASCORBIC ACID) 250 MG tablet, Take 250 mg by mouth at bedtime., Disp: , Rfl:  .  Vitamin D, Ergocalciferol, (DRISDOL) 1.25 MG (50000 UNIT) CAPS capsule, TAKE ONE CAPSULE BY MOUTH 2 TIMES A WEEK(SUNDAY AND THURSDAY), Disp: 24 capsule, Rfl: 0 .  VRAYLAR 6 MG CAPS, Take 6 mg by mouth daily. , Disp: , Rfl:  Allergies  Allergen Reactions  . Lisinopril Hives    Other reaction(s): anaphylaxis/angioedema  . Latex Hives  . Morphine And Related     Itching, skin get hot.   . Adhesive [Tape] Rash     Review of Systems  Constitutional: Negative.  Negative for fatigue.  Respiratory: Negative.   Cardiovascular: Negative.  Negative for chest pain, palpitations and leg swelling.  Neurological: Negative for dizziness and headaches.  Psychiatric/Behavioral: Negative.      Today's Vitals   05/22/20 1614  BP: 120/76  Pulse: 68  Temp: 98.7 F (37.1 C)  TempSrc: Oral  Weight: 169 lb 12.8 oz (77 kg)  Height: 5' 5.6" (1.666 m)  PainSc: 8    Body mass index is 27.74 kg/m.   Objective:  Physical Exam Vitals reviewed.  Constitutional:      General: She is not in acute distress.    Appearance: Normal appearance.  Cardiovascular:     Rate and Rhythm: Normal rate and regular rhythm.     Pulses: Normal pulses.     Heart sounds: Normal heart sounds. No murmur heard.   Pulmonary:     Effort: Pulmonary effort is normal. No respiratory distress.     Breath sounds: Normal breath sounds.  Skin:    Capillary Refill: Capillary refill takes less than 2 seconds.  Neurological:     General: No focal deficit present.     Mental Status: She is alert and oriented to person, place, and time.     Cranial Nerves: No cranial nerve deficit.     Deep Tendon Reflexes: Abnormal reflex: Flat affect, she speaks slowly.  Psychiatric:        Behavior: Behavior normal.        Thought Content: Thought  content normal.        Judgment: Judgment normal.     Comments: Flat affect and speech is slow         Assessment And Plan:     1. Essential hypertension . B/P is well controlled.  . CMP ordered to check renal function.  . The importance of regular exercise and dietary modification was stressed to the patient.  - atenolol (TENORMIN) 100 MG tablet; Take 1 tablet (100 mg total) by mouth daily.  Dispense: 90 tablet; Refill: 1 - CMP14 + Anion Gap  2. Type 2 diabetes mellitus without complication, without long-term current use of insulin (HCC)  Chronic, controlled  Continue with current medications  Encouraged to limit intake of sugary foods and drinks  Encouraged to increase physical activity to 150 minutes per week as tolerated - metFORMIN (GLUCOPHAGE) 500 MG tablet; Take 1 tablet (500 mg total) by mouth 2 (two) times daily with a meal.  Dispense: 180 tablet; Refill: 1 - Lipid panel - CMP14 + Anion Gap - Hemoglobin A1c  3. Nausea  She reports this was started by a previous provider for the promethazine daily due to her constant nausea.  I do feel this nausea may be related to the multiple medications she is taking.  I have sent a referral to [facility] to evaluate for polypharmacy, most of her medications are related to her psychiatric history - promethazine (PHENERGAN) 25 MG tablet; Take 1 tablet (25 mg total) by mouth every 6 (six) hours as needed for nausea or vomiting.  Dispense: 90 tablet; Refill: 0  4. Polypharmacy  She is on multiple medications, it would be ideal to see if she is able to discontinue any medications, this will require also  communication with her behavioral specialist   I have made a referral for CCM  - Referral to Chronic Care Management Services  5. Vitamin D deficiency  Will check vitamin D level and supplement as needed.     Also encouraged to spend 15 minutes in the sun daily.  - Vitamin D (25 hydroxy)  6. Other chronic pain  Patient  reports she is on a pain pump given by the pain provider  7. Other long term (current) drug therapy - TSH  8. Schizoaffective disorder, bipolar type ([place])  Continue follow up with behavioral health  9. Encounter for hepatitis C screening test for low risk patient  Will check Hepatitis C screening due to recent recommendations to screen all adults 18 years and older - Hepatitis C antibody  10. Screening mammogram, encounter for  Pt instructed on Self Breast Exam.According to ACOG guidelines Women aged 19 and older are recommended to get an annual mammogram. Form completed and given to patient contact the [facility] for appointment scheduing.   Pt encouraged to get annual mammogram - MM DIGITAL SCREENING BILATERAL; Future   She will return later in the week for labs only as she needs to hydrate  Patient was given opportunity to ask questions. Patient verbalized understanding of the plan and was able to repeat key elements of the plan. All questions were answered to their satisfaction.   Teola Bradley, FNP, have reviewed all documentation for this visit. The documentation on 05/22/20 for the exam, diagnosis, procedures, and orders are all accurate and complete.  THE PATIENT IS ENCOURAGED TO PRACTICE SOCIAL DISTANCING DUE TO THE COVID-19 PANDEMIC.

## 2020-05-22 NOTE — Patient Instructions (Signed)
COVID-19 Vaccine Information can be found at: [email or web address] For questions related to vaccine distribution or appointments, please email [email or web address] or call [phone].    

## 2020-05-22 NOTE — Telephone Encounter (Cosign Needed)
  Chronic Care Management   Outreach Note  05/22/2020 Name: Linda Mercado MRN: [id number] DOB: [birth year]  Referred by: [name], FNP Reason for referral : Chronic Care Management (CCM RNCM FU Call )   An unsuccessful telephone outreach was attempted today. The patient was referred to the case management team for assistance with care management and care coordination.   Follow Up Plan: A HIPAA compliant phone message was left for the patient providing contact information and requesting a return call.  Telephone follow up appointment with care management team member scheduled for:06/13/20  Barb Merino, RN, BSN, CCM Care Management Coordinator [place] [facility]  Direct Phone: [phone]

## 2020-05-23 ENCOUNTER — Telehealth: Payer: Self-pay

## 2020-05-23 NOTE — Telephone Encounter (Signed)
I left a message asking the patient to call and reschedule AWV with [place].

## 2020-05-25 ENCOUNTER — Telehealth: Payer: Self-pay

## 2020-05-25 ENCOUNTER — Telehealth: Payer: Medicare Other

## 2020-05-25 NOTE — Telephone Encounter (Cosign Needed)
  Chronic Care Management   Outreach Note  05/25/2020 Name: Linda Mercado MRN: [id number] DOB: [birth year]  Referred by: [name], FNP Reason for referral : Chronic Care Management (CCM RNCM FU Call )  Voice message received from patient stating she is returning my call. Patient requested a call back. A second unsuccessful telephone outreach was attempted today. The patient was referred to the case management team for assistance with care management and care coordination.   Follow Up Plan: Telephone follow up appointment with care management team member scheduled for:06/13/20  Barb Merino, RN, BSN, CCM Care Management Coordinator [place] [facility]  Direct Phone: [phone]

## 2020-05-26 ENCOUNTER — Telehealth: Payer: Self-pay

## 2020-05-26 ENCOUNTER — Telehealth: Payer: Medicare Other

## 2020-05-26 NOTE — Telephone Encounter (Cosign Needed)
  Chronic Care Management   Outreach Note  05/26/2020 Name: Linda Mercado MRN: [id number] DOB: 1966/02/07  Referred by: [name], FNP Reason for referral : Chronic Care Management (Inbound call from patient )   An unsuccessful telephone outreach was attempted today. The patient was referred to the case management team for assistance with care management and care coordination.   Follow Up Plan: Telephone follow up appointment with care management team member scheduled for: 06/13/20  Barb Merino, RN, BSN, CCM Care Management Coordinator [place] [facility]  Direct Phone: [phone]

## 2020-05-29 ENCOUNTER — Telehealth: Payer: Self-pay | Admitting: *Deleted

## 2020-05-29 NOTE — Chronic Care Management (AMB) (Signed)
  Chronic Care Management   Note  05/29/2020 Name: MILIANNA ERICSSON MRN: [id number] DOB: [birth year]  ASHLEN KIGER is a 54 y.o. year old female who is a primary care patient of [name], [place]. I reached out to [name] by phone today in response to a referral sent by Ms. Westley Chandler Pribyl's PCP, [name], FNP.     Ms. Singleterry was given information about Chronic Care Management services today including:  1. CCM service includes personalized support from designated clinical staff supervised by her physician, including individualized plan of care and coordination with other care providers 2. 24/7 contact phone numbers for assistance for urgent and routine care needs. 3. Service will only be billed when office clinical staff spend 20 minutes or more in a month to coordinate care. 4. Only one practitioner may furnish and bill the service in a calendar month. 5. The patient may stop CCM services at any time (effective at the end of the month) by phone call to the office staff. 6. The patient will be responsible for cost sharing (co-pay) of up to 20% of the service fee (after annual deductible is met).  Patient agreed to services and verbal consent obtained.   Follow up plan: Telephone appointment with care management team member scheduled for:06/21/2020  [place] [facility]  Direct Dial: [phone]

## 2020-05-31 ENCOUNTER — Telehealth: Payer: Medicare Other

## 2020-05-31 ENCOUNTER — Telehealth: Payer: Self-pay

## 2020-05-31 NOTE — Telephone Encounter (Signed)
  Chronic Care Management   Outreach Note  05/31/2020 Name: Linda Mercado MRN: [id number] DOB: 03-28-1966  Referred by: [name], FNP Reason for referral : Care Coordination   SW placed an unsuccessful outbound call to the patient in response to a voice message received. Unfortunately, the patients voice mailbox is full which prohibited SW from leaving a HIPAA compliant voice message requesting a return call.  Follow Up Plan: The care management team will reach out to the patient again over the next 45 days.   Daneen Schick, BSW, CDP Social Worker, Certified Dementia Practitioner [place] [facility] [place] [facility] [phone]

## 2020-06-02 ENCOUNTER — Ambulatory Visit: Payer: Medicare Other

## 2020-06-13 ENCOUNTER — Other Ambulatory Visit: Payer: Self-pay

## 2020-06-13 ENCOUNTER — Ambulatory Visit: Payer: Self-pay

## 2020-06-13 ENCOUNTER — Telehealth: Payer: Medicare Other

## 2020-06-13 DIAGNOSIS — I10 Essential (primary) hypertension: Secondary | ICD-10-CM

## 2020-06-13 DIAGNOSIS — M5416 Radiculopathy, lumbar region: Secondary | ICD-10-CM

## 2020-06-13 DIAGNOSIS — E119 Type 2 diabetes mellitus without complications: Secondary | ICD-10-CM

## 2020-06-13 DIAGNOSIS — E063 Autoimmune thyroiditis: Secondary | ICD-10-CM

## 2020-06-13 DIAGNOSIS — G894 Chronic pain syndrome: Secondary | ICD-10-CM

## 2020-06-14 NOTE — Patient Instructions (Addendum)
Visit Information  Goals Addressed      Patient Stated   .  "I need my equipment replaced" (pt-stated)        [place] (see longitudinal plan of care for additional care plan information)  Current Barriers:  . Patient needs replacement equipment including a manual W/C and 4 pronged cane  . Chronic Disease Management support and education needs related to Type 2 diabetes Mellitus, Essential hypertension, Chronic pain syndrome, Hashimoto's disease, Lumbar radiculopathy  Nurse Case Manager Clinical Goal(s):  [name] Over the next 45 days, patient will work with the CCM team and PCP  to address needs related to care coordination with DME needs  CCM RN CM Interventions:  06/13/20 call completed with patient  . Inter-disciplinary care team collaboration (see longitudinal plan of care) . Evaluation of current treatment plan related to DME needs  and patient's adherence to plan as established by provider. Nash Dimmer with PCP [name] FNP regarding patient's request for replacement DME for a manual W/C and 4 pronged cane due to patient reports her equipment was taken when her Goddaughter's care was repossed  . Discussed plans with patient for ongoing care management follow up and provided patient with direct contact information for care management team . Reviewed scheduled/upcoming provider appointments including: next PCP OV scheduled for 07/19/20 @ 3:00 PM   Patient Self Care Activities:  . Self administers medications as prescribed . Attend all scheduled provider appointments . Call pharmacy for medication refills . Call provider office for new concerns or questions  Initial goal documentation     .  "to get my pain under better control" (pt-stated)        CARE PLAN ENTRY (see longitudinal plan of care for additional care plan information)  Current Barriers:  [name] Knowledge Deficits related to disease process and Self Health management of Chronic Pain  . Chronic Disease  Management support and education needs related to Type 2 diabetes Mellitus, Essential hypertension, Chronic pain syndrome, Hashimoto's disease, Lumbar radiculopathy . Lacks caregiver support . Transportation barriers  Nurse Case Manager Clinical Goal(s):  [name] Over the next 180 days, patient will work with the CCM team and PCP to address needs related to disease education and support for improved Self Health management of Chronic Pain Syndrome   CCM RN CM Interventions:  06/13/20 call completed with patient  . Inter-disciplinary care team collaboration (see longitudinal plan of care) . Evaluation of current treatment plan related to Chronic Pain Syndrome and patient's adherence to plan as established by provider . Determined patient continues to have her Chronic pain managed by Dr. Cherly Anderson with [facility] with her last pump refill completed on 05/26/20 . Determined Dr. Cherly Anderson has scheduled a pump refill and replacement to attempt to decrease patient's pain secondary to pump displacement  . Reviewed medications with patient and discussed patient's current intrathecal pain regimen is as follows:   o Iohexol (OMNIPAQUE) 180 mg/mL injection o 1 o 05/26/2020 o   o  lidocaine 1% (PF) 10 mg/mL injection o 1 o 05/26/2020 o   o  promethazine (PHENERGAN) injection 25 mg o 1 o 05/26/2020 o    . Discussed plans with patient for ongoing care management follow up and provided patient with direct contact information for care management team  Patient Self Care Activities:  . Self administers medications as prescribed . Attends all scheduled provider appointments . Calls pharmacy for medication refills . Calls provider office for new concerns or questions . Lacks  social connections . Unable to perform ADLs independently . Unable to perform IADLs independently  Please see past updates related to this goal by clicking on the "Past Updates" button in the selected goal        The patient verbalized  understanding of instructions, educational materials, and care plan provided today and declined offer to receive copy of patient instructions, educational materials, and care plan.   Telephone follow up appointment with care management team member scheduled for: 07/26/20  Barb Merino, RN, BSN, CCM Care Management Coordinator [place] [facility]  Direct Phone: [phone]

## 2020-06-14 NOTE — Chronic Care Management (AMB) (Signed)
Chronic Care Management   Follow Up Note   06/13/2020 Name: Linda Mercado MRN: [id number] DOB: 1966-01-20  Referred by: [name], FNP Reason for referral : Chronic Care Management (CCM RNCM FU Call )   Linda Mercado is a 54 y.o. year old female who is a primary care patient of [name], [place]. The CCM team was consulted for assistance with chronic disease management and care coordination needs.    Review of patient status, including review of consultants reports, relevant laboratory and other test results, and collaboration with appropriate care team members and the patient's provider was performed as part of comprehensive patient evaluation and provision of chronic care management services.    SDOH (Social Determinants of Health) assessments performed: Yes  See Care Plan activities for detailed interventions related to [place])   Placed outbound CCM RN CM follow up call to patient for a care plan update.     Outpatient Encounter Medications as of 06/13/2020  Medication Sig  . ACCU-CHEK GUIDE test strip TEST TWICE DAILY AS DIRECTED  . albuterol (VENTOLIN HFA) 108 (90 Base) MCG/ACT inhaler INHALE 2 PUFFS INTO THE LUNGS EVERY 6 HOURS AS NEEDED FOR WHEEZING OR SHORTNESS OF BREATH  . AMITIZA 24 MCG capsule Take 24 mcg by mouth 2 (two) times daily.   [name] atenolol (TENORMIN) 100 MG tablet Take 1 tablet (100 mg total) by mouth daily.  [name] atorvastatin (LIPITOR) 10 MG tablet Take 1 tablet (10 mg total) by mouth at bedtime.  . Blood Glucose Monitoring Suppl (ACCU-CHEK GUIDE) w/Device KIT USE TO CHECK BLOOD SUGAR TWICE DAILY  . calcium gluconate 500 MG tablet Take 1 tablet (500 mg total) by mouth 3 (three) times daily.  . CAPLYTA 42 MG CAPS Take 42 mg by mouth at bedtime.   . clonazePAM (KLONOPIN) 1 MG tablet Take 1 mg by mouth 3 (three) times daily.  . Cyanocobalamin (B-12) 500 MCG SUBL One SL QD  . DEXILANT 60 MG capsule Take 1 capsule by mouth daily. (Patient taking differently: Take 60  mg by mouth daily. )  . diclofenac Sodium (VOLTAREN) 1 % GEL APPLY 4 GRAMS TO THE AFFECTED AREA FOUR TIMES DAILY  . docusate sodium (COLACE) 50 MG capsule Take 50 mg by mouth daily.   [name] ELDERBERRY PO Take 1 tablet by mouth at bedtime.  . fluticasone (FLOVENT HFA) 110 MCG/ACT inhaler Inhale 2 puffs into the lungs every 12 (twelve) hours.  . hydrochlorothiazide (HYDRODIURIL) 12.5 MG tablet TAKE 1 TABLET(12.5 MG) BY MOUTH DAILY  . HYDROmorphone (DILAUDID) 4 MG tablet Take 4 mg by mouth 4 (four) times daily as needed for severe pain.  . hydrOXYzine (ATARAX/VISTARIL) 25 MG tablet Take 25 mg by mouth in the morning, at noon, in the evening, and at bedtime.   . Lancets (ACCU-CHEK MULTICLIX) lancets Use as instructed  . lidocaine (XYLOCAINE) 2 % solution Use as directed 15 mLs in the mouth or throat 3 (three) times a week.   . Lidocaine 4 % PTCH Place 1 patch onto the skin in the morning and at bedtime.  [name] LORazepam (ATIVAN) 2 MG tablet Take 2 mg by mouth 4 (four) times daily as needed for anxiety.   [name] losartan (COZAAR) 25 MG tablet Take 1 tablet (25 mg total) by mouth daily.  . metFORMIN (GLUCOPHAGE) 500 MG tablet Take 1 tablet (500 mg total) by mouth 2 (two) times daily with a meal.  . Multiple Vitamin (MULTIVITAMIN WITH MINERALS) TABS tablet Take 1 tablet by mouth  daily.   [name] NARCAN 4 MG/0.1ML LIQD nasal spray kit Place 1 spray into the nose as directed.  . Norgestimate-Ethinyl Estradiol Triphasic (TRI-ESTARYLLA) 0.18/0.215/0.25 MG-35 MCG tablet Take 1 tablet by mouth daily.  . ondansetron (ZOFRAN) 4 MG tablet Take 4 mg by mouth as needed for nausea.   [name] PAIN MANAGEMENT INTRATHECAL, IT, PUMP 1 each by Intrathecal route continuous. Intrathecal (IT) medication: fentanyl, clonidine, bupivicaine  Fentanyl 29m/bupivicaine150mg/clonidine53mg  51.937mper day, 53m43mer day, 1.36m63m5.4ml/61mn  . polyethylene glycol powder (GLYCOLAX/MIRALAX) 17 GM/SCOOP powder MIX 17 GRAMS IN 8 OUNCES OF LIQUID AND DRINK  DAILY FOR BOWELS  . promethazine (PHENERGAN) 25 MG tablet Take 1 tablet (25 mg total) by mouth every 6 (six) hours as needed for nausea or vomiting.  . QUEMarland Kitcheniapine (SEROQUEL XR) 400 MG 24 hr tablet Take 400 mg by mouth 2 (two) times daily.  . QUEtiapine (SEROQUEL) 300 MG tablet Take 300 mg by mouth at bedtime.  . tiZMarland KitchenNidine (ZANAFLEX) 4 MG tablet Take 4 mg by mouth every 4 (four) hours.  . traZODone (DESYREL) 100 MG tablet 5 qhs prescribed by psychiatry (Patient taking differently: Take 100-200 mg by mouth at bedtime. 5 qhs prescribed by psychiatry)  . triamcinolone cream (KENALOG) 0.1 % Apply 1 application topically daily as needed (for itching).  . vitamin C (ASCORBIC ACID) 250 MG tablet Take 250 mg by mouth at bedtime.  . Vitamin D, Ergocalciferol, (DRISDOL) 1.25 MG (50000 UNIT) CAPS capsule TAKE ONE CAPSULE BY MOUTH 2 TIMES A WEEK(SUNDAY AND THURSDAY)  . VRAYLAR 6 MG CAPS Take 6 mg by mouth daily.    No facility-administered encounter medications on file as of 06/13/2020.     Objective:  Lab Results  Component Value Date   HGBA1C 6.7 (H) 10/12/2019   HGBA1C 6.5 (H) 04/29/2019   HGBA1C 6.5 (H) 09/17/2018   Lab Results  Component Value Date   LDLCALC 48 04/29/2019   CREATININE 0.80 10/06/2019   BP Readings from Last 3 Encounters:  05/22/20 120/76  10/28/19 118/80  10/12/19 118/80    Goals Addressed      Patient Stated   .  "I need my equipment replaced" (pt-stated)        CARE [place] longitudinal plan of care for additional care plan information)  Current Barriers:  . Patient needs replacement equipment including a manual W/C and 4 pronged cane  . Chronic Disease Management support and education needs related to Type 2 diabetes Mellitus, Essential hypertension, Chronic pain syndrome, Hashimoto's disease, Lumbar radiculopathy  Nurse Case Manager Clinical Goal(s):  . OveMarland Kitchen the next 45 days, patient will work with the CCM team and PCP  to address needs related to  care coordination with DME needs  CCM RN CM Interventions:  06/13/20 call completed with patient  . Inter-disciplinary care team collaboration (see longitudinal plan of care) . Evaluation of current treatment plan related to DME needs  and patient's adherence to plan as established by provider. . ColNash Dimmer PCP JanecMinette Brineregarding patient's request for replacement DME for a manual W/C and 4 pronged cane due to patient reports her equipment was taken when her Goddaughter's care was repossed  . Discussed plans with patient for ongoing care management follow up and provided patient with direct contact information for care management team . Reviewed scheduled/upcoming provider appointments including: next PCP OV scheduled for 07/19/20 @ 3:00 PM   Patient Self Care Activities:  . Self administers medications as prescribed . Attend all scheduled  provider appointments . Call pharmacy for medication refills . Call provider office for new concerns or questions  Initial goal documentation     .  "to get my pain under better control" (pt-stated)        CARE PLAN ENTRY (see longitudinal plan of care for additional care plan information)  Current Barriers:  [name] Knowledge Deficits related to disease process and Self Health management of Chronic Pain  . Chronic Disease Management support and education needs related to Type 2 diabetes Mellitus, Essential hypertension, Chronic pain syndrome, Hashimoto's disease, Lumbar radiculopathy . Lacks caregiver support . Transportation barriers  Nurse Case Manager Clinical Goal(s):  [name] Over the next 180 days, patient will work with the CCM team and PCP to address needs related to disease education and support for improved Self Health management of Chronic Pain Syndrome   CCM RN CM Interventions:  06/13/20 call completed with patient  . Inter-disciplinary care team collaboration (see longitudinal plan of care) . Evaluation of current treatment plan  related to Chronic Pain Syndrome and patient's adherence to plan as established by provider . Determined patient continues to have her Chronic pain managed by Dr. Cherly Anderson with [facility] with her last pump refill completed on 05/26/20 . Determined Dr. Cherly Anderson has scheduled a pump refill and replacement to attempt to decrease patient's pain secondary to pump displacement  . Reviewed medications with patient and discussed patient's current intrathecal pain regimen is as follows:   o Iohexol (OMNIPAQUE) 180 mg/mL injection o 1 o 05/26/2020 o   o  lidocaine 1% (PF) 10 mg/mL injection o 1 o 05/26/2020 o   o  promethazine (PHENERGAN) injection 25 mg o 1 o 05/26/2020 o    . Discussed plans with patient for ongoing care management follow up and provided patient with direct contact information for care management team  Patient Self Care Activities:  . Self administers medications as prescribed . Attends all scheduled provider appointments . Calls pharmacy for medication refills . Calls provider office for new concerns or questions . Lacks social connections . Unable to perform ADLs independently . Unable to perform IADLs independently  Please see past updates related to this goal by clicking on the "Past Updates" button in the selected goal       Plan:   Telephone follow up appointment with care management team member scheduled for: 07/26/20  Barb Merino, RN, BSN, CCM Care Management Coordinator [facility]  Direct Phone: [phone]

## 2020-06-19 ENCOUNTER — Telehealth: Payer: Self-pay

## 2020-06-19 NOTE — Chronic Care Management (AMB) (Signed)
Chronic Care Management Pharmacy Assistant   Name: Linda Mercado  MRN: [id number] DOB: Jun 13, 1966  Reason for Encounter: Medication Review Initial questions for pharmacist visit 06/21/2020.    Have you seen any other providers since your last visit?yes 05/26/2020 Caryl Never MD-  Anesthesiology - Pain Management Fluoroscopy  05/22/2020 [name] FNP (PCP) -  Hypertension, Diabetes , Nausea    Any changes in your medications or health?Yes Promethazine 25 mg 1 tablet every 6 hours for nausea PRN  Any side effects from any medications? Yes Seroquel-sometimes make her nauseated. PCP gave Promethazine 25 mg which helps. Patient is having to use medication daily.  Do you have an symptoms or problems not managed by your medications? Patient denies any symptoms or problems with medications.  Any concerns about your health right now? Patient states that she is suffering from chronic pain due to injury from 12/18/2006 where patient fell off a ladder at work.   Has your provider asked that you check blood pressure, blood sugar, or follow special diet at home? Patient does check blood sugars once a week fasting. Runs between 98-144. Patient did state she has a couple low blood sugar readings (50-67) once a month. She does know when they are low, she will have lightheadedness and dizziness.   Do you get any type of exercise on a regular basis? No, patient does not have an exercise regimen. Patient states she is active, cleans her house and does walk outside. She is unable to do any chair exercises.   Can you think of a goal you would like to reach for your health? Patient states she would like to get her pain under control  Do you have any problems getting your medications? No, patient currently gets her prescriptions at [facility] on [address] in [place].   Is there anything that you would like to discuss during the appointment? Patient would like to discuss her medications.  Patient  is aware to have her medications and supplements at phone appointment    PCP : [name], FNP  Allergies:   Allergies  Allergen Reactions  . Lisinopril Hives    Other reaction(s): anaphylaxis/angioedema  . Latex Hives  . Morphine And Related     Itching, skin get hot.   . Adhesive [Tape] Rash    Medications: Outpatient Encounter Medications as of 06/19/2020  Medication Sig  . ACCU-CHEK GUIDE test strip TEST TWICE DAILY AS DIRECTED  . albuterol (VENTOLIN HFA) 108 (90 Base) MCG/ACT inhaler INHALE 2 PUFFS INTO THE LUNGS EVERY 6 HOURS AS NEEDED FOR WHEEZING OR SHORTNESS OF BREATH  . AMITIZA 24 MCG capsule Take 24 mcg by mouth 2 (two) times daily.   [name] atenolol (TENORMIN) 100 MG tablet Take 1 tablet (100 mg total) by mouth daily.  [name] atorvastatin (LIPITOR) 10 MG tablet Take 1 tablet (10 mg total) by mouth at bedtime.  . Blood Glucose Monitoring Suppl (ACCU-CHEK GUIDE) w/Device KIT USE TO CHECK BLOOD SUGAR TWICE DAILY  . calcium gluconate 500 MG tablet Take 1 tablet (500 mg total) by mouth 3 (three) times daily.  . CAPLYTA 42 MG CAPS Take 42 mg by mouth at bedtime.   . clonazePAM (KLONOPIN) 1 MG tablet Take 1 mg by mouth 3 (three) times daily.  . Cyanocobalamin (B-12) 500 MCG SUBL One SL QD  . DEXILANT 60 MG capsule Take 1 capsule by mouth daily. (Patient taking differently: Take 60 mg by mouth daily. )  . diclofenac Sodium (VOLTAREN) 1 %  GEL APPLY 4 GRAMS TO THE AFFECTED AREA FOUR TIMES DAILY  . docusate sodium (COLACE) 50 MG capsule Take 50 mg by mouth daily.   [name] ELDERBERRY PO Take 1 tablet by mouth at bedtime.  . fluticasone (FLOVENT HFA) 110 MCG/ACT inhaler Inhale 2 puffs into the lungs every 12 (twelve) hours.  . hydrochlorothiazide (HYDRODIURIL) 12.5 MG tablet TAKE 1 TABLET(12.5 MG) BY MOUTH DAILY  . HYDROmorphone (DILAUDID) 4 MG tablet Take 4 mg by mouth 4 (four) times daily as needed for severe pain.  . hydrOXYzine (ATARAX/VISTARIL) 25 MG tablet Take 25 mg by mouth in the  morning, at noon, in the evening, and at bedtime.   . Lancets (ACCU-CHEK MULTICLIX) lancets Use as instructed  . lidocaine (XYLOCAINE) 2 % solution Use as directed 15 mLs in the mouth or throat 3 (three) times a week.   . Lidocaine 4 % PTCH Place 1 patch onto the skin in the morning and at bedtime.  [name] LORazepam (ATIVAN) 2 MG tablet Take 2 mg by mouth 4 (four) times daily as needed for anxiety.   [name] losartan (COZAAR) 25 MG tablet Take 1 tablet (25 mg total) by mouth daily.  . metFORMIN (GLUCOPHAGE) 500 MG tablet Take 1 tablet (500 mg total) by mouth 2 (two) times daily with a meal.  . Multiple Vitamin (MULTIVITAMIN WITH MINERALS) TABS tablet Take 1 tablet by mouth daily.   [name] NARCAN 4 MG/0.1ML LIQD nasal spray kit Place 1 spray into the nose as directed.  . Norgestimate-Ethinyl Estradiol Triphasic (TRI-ESTARYLLA) 0.18/0.215/0.25 MG-35 MCG tablet Take 1 tablet by mouth daily.  . ondansetron (ZOFRAN) 4 MG tablet Take 4 mg by mouth as needed for nausea.   [name] PAIN MANAGEMENT INTRATHECAL, IT, PUMP 1 each by Intrathecal route continuous. Intrathecal (IT) medication: fentanyl, clonidine, bupivicaine  Fentanyl $RemoveB'250mg'xpLJWGIT$ /bupivicaine$Remove'150mg'XnbfuGo$ /clonidi'2mg'$   51.$Re'97mg'USE$  per day, $Remove'2mg'CcZSWkq$  per day, 1.76mcg 15.65ml/qmin  . polyethylene glycol powder (GLYCOLAX/MIRALAX) 17 GM/SCOOP powder MIX 17 GRAMS IN 8 OUNCES OF LIQUID AND DRINK DAILY FOR BOWELS  . promethazine (PHENERGAN) 25 MG tablet Take 1 tablet (25 mg total) by mouth every 6 (six) hours as needed for nausea or vomiting.  [name] QUEtiapine (SEROQUEL XR) 400 MG 24 hr tablet Take 400 mg by mouth 2 (two) times daily.  . QUEtiapine (SEROQUEL) 300 MG tablet Take 300 mg by mouth at bedtime.  [name] tiZANidine (ZANAFLEX) 4 MG tablet Take 4 mg by mouth every 4 (four) hours.  . traZODone (DESYREL) 100 MG tablet 5 qhs prescribed by psychiatry (Patient taking differently: Take 100-200 mg by mouth at bedtime. 5 qhs prescribed by psychiatry)  . triamcinolone cream (KENALOG) 0.1 % Apply 1  application topically daily as needed (for itching).  . vitamin C (ASCORBIC ACID) 250 MG tablet Take 250 mg by mouth at bedtime.  . Vitamin D, Ergocalciferol, (DRISDOL) 1.25 MG (50000 UNIT) CAPS capsule TAKE ONE CAPSULE BY MOUTH 2 TIMES A WEEK(SUNDAY AND THURSDAY)  . VRAYLAR 6 MG CAPS Take 6 mg by mouth daily.    No facility-administered encounter medications on file as of 06/19/2020.    Current Diagnosis: Patient Active Problem List   Diagnosis Date Noted  . Pain in right knee 11/17/2019  . Pain in left knee 05/06/2019  . Hashimoto's disease 04/05/2019  . Nausea 11/23/2018  . Frequent falls 09/17/2018  . Skin lesion 09/17/2018  . GERD (gastroesophageal reflux disease)   . Chronic midline low back pain with sciatica 05/27/2018  . Intractable pain 03/20/2018  . Bradycardia   . Chronic  pain syndrome 03/18/2018  . 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 Hypokalemia 03/18/2018  . Intractable back pain 01/03/2018  . HTN (hypertension) 01/03/2018  . Type 2 diabetes mellitus without complication ([place]) 99/37/1696  . Vitamin D deficiency 07/11/2017  . Postmenopausal 04/24/2017  . Microalbuminuria 01/14/2017  . Dyspareunia in female 12/10/2016  . Endometriosis determined by laparoscopy 12/10/2016  . History of exploratory laparotomy 12/10/2016  . Pelvic pain 12/10/2016  . Bipolar 1 disorder ([place]) 09/19/2016  . Cyst of right ovary 09/19/2016  . Severe episode of recurrent major depressive disorder, with psychotic features ([place]) 09/19/2016  . Schizoaffective disorder, bipolar type ([place]) 09/19/2016  . Chronic pain 03/05/2016  . Non morbid obesity due to excess calories 03/05/2016  . S/P lumbar fusion 03/05/2016      Follow-Up:  Pharmacist Review - Patient states she does not have a blood pressure cuff at home.  Orlando Penner, CPP notified   Judithann Sheen, [place] Pharmacist Assistant [phone]

## 2020-06-20 ENCOUNTER — Telehealth: Payer: Self-pay

## 2020-06-20 NOTE — Telephone Encounter (Signed)
I called the pt to schedule AWV with Nickeah.  We had a bad connection and couldn't hear each other.  The call dropped, and I called her back, but it went to voicemail.  I left a message asking her to call back to schedule it.

## 2020-06-21 ENCOUNTER — Ambulatory Visit: Payer: Medicare Other

## 2020-06-21 DIAGNOSIS — E559 Vitamin D deficiency, unspecified: Secondary | ICD-10-CM

## 2020-06-21 DIAGNOSIS — F250 Schizoaffective disorder, bipolar type: Secondary | ICD-10-CM

## 2020-06-21 DIAGNOSIS — E119 Type 2 diabetes mellitus without complications: Secondary | ICD-10-CM

## 2020-06-21 DIAGNOSIS — G894 Chronic pain syndrome: Secondary | ICD-10-CM

## 2020-06-21 DIAGNOSIS — I10 Essential (primary) hypertension: Secondary | ICD-10-CM

## 2020-06-21 DIAGNOSIS — K219 Gastro-esophageal reflux disease without esophagitis: Secondary | ICD-10-CM

## 2020-06-21 NOTE — Chronic Care Management (AMB) (Signed)
Chronic Care Management Pharmacy  Name: Linda Mercado  MRN: [id number] DOB: 1966-04-24   Chief Complaint/ HPI  Linda Mercado,  54 y.o. , female presents for their Initial CCM visit with the clinical pharmacist via telephone due to COVID-19 Pandemic. Patient has twin daughter who live with her. They help her a lot because she reports she falls a lot. She reports that in the past she had bone pain. She does not know why it happened.  She has a medical pump that she uses for pain that was implanted by her pain management doctor. The pump is in her stomach and she reports they are going to call her about the pump replacement. She has had three fusions in her back and has four rods in her back. Patient reports at her last office visit she was unable to get her blood work done because she was a hard stick and she has not come back to the office for updated blood work.   PCP : [name], FNP  Their chronic conditions include: Hypertension, Type 2 Diabetes, Hyperlipidemia, Chronic Pain, GERD, Depression,   Office Visits:      01/21/2020 Order re-entered: Phenergan for Nausea PRN   Consult Visit:  05/12/2020: Pump refill   04/27/2020 RN CCM Visit: Initial visit with RN for CCM services   04/12/2020 Chronic Pain OV: Pump refill- Fentanyl was increased to 45.23 mcg/24h, reflecting a 4.9% increase. She has a bolus q 6 hours reflecting 2 mcg q6h, maximum 4 activations per day.See Scanned Pump sheet for details. Next pump refill before 05/15/20.  03/22/2020 Palliative Care home visit  03/08/2020 Chronic Pain OV: Surgery intrathecal pump refill  03/01/2020 CCM Visit: RN closed patients CCM services after multiple visit attemps  02/02/2020 [facility]:Mrs. 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 Continues to have exquisite tenderness throughout her whole  body to light touch consistent with allodynia. In meanttime, we will plan to adjust pump settings as noted below to see if it can provide her further benefit as she states she is finding some mild improvement in her pain with the device. In future, if studies return unremarkable, may need to consider removing pump if remains in-effective and/or consider opioid weaning as she likely has element of opioid induced hyperalgesia.      Medications: Outpatient Encounter Medications as of 06/21/2020  Medication Sig  . ACCU-CHEK GUIDE test strip TEST TWICE DAILY AS DIRECTED  . albuterol (VENTOLIN HFA) 108 (90 Base) MCG/ACT inhaler INHALE 2 PUFFS INTO THE LUNGS EVERY 6 HOURS AS NEEDED FOR WHEEZING OR SHORTNESS OF BREATH  . AMITIZA 24 MCG capsule Take 24 mcg by mouth 2 (two) times daily.   [name] atenolol (TENORMIN) 100 MG tablet Take 1 tablet (100 mg total) by mouth daily.  [name] atorvastatin (LIPITOR) 10 MG tablet Take 1 tablet (10 mg total) by mouth at bedtime.  . Blood Glucose Monitoring Suppl (ACCU-CHEK GUIDE) w/Device KIT USE TO CHECK BLOOD SUGAR TWICE DAILY  . calcium gluconate 500 MG tablet Take 1 tablet (500 mg total) by mouth 3 (three) times daily.  . CAPLYTA 42 MG CAPS Take 42 mg by mouth at bedtime.   . clonazePAM (KLONOPIN) 1 MG tablet Take 1 mg by mouth 3 (three) times daily.  . Cyanocobalamin (B-12) 500 MCG SUBL One SL QD  . DEXILANT 60 MG capsule Take 1 capsule by  mouth daily. (Patient taking differently: Take 60 mg by mouth daily. )  . diclofenac Sodium (VOLTAREN) 1 % GEL APPLY 4 GRAMS TO THE AFFECTED AREA FOUR TIMES DAILY  . docusate sodium (COLACE) 50 MG capsule Take 50 mg by mouth daily.   [name] ELDERBERRY PO Take 1 tablet by mouth at bedtime.  . fluticasone (FLOVENT HFA) 110 MCG/ACT inhaler Inhale 2 puffs into the lungs every 12 (twelve) hours.  . hydrochlorothiazide (HYDRODIURIL) 12.5 MG tablet TAKE 1 TABLET(12.5 MG) BY MOUTH DAILY  . HYDROmorphone (DILAUDID) 4 MG tablet Take 4 mg by mouth 4  (four) times daily as needed for severe pain.  . hydrOXYzine (ATARAX/VISTARIL) 25 MG tablet Take 25 mg by mouth in the morning, at noon, in the evening, and at bedtime.   . Lancets (ACCU-CHEK MULTICLIX) lancets Use as instructed  . lidocaine (XYLOCAINE) 2 % solution Use as directed 15 mLs in the mouth or throat 3 (three) times a week.   . Lidocaine 4 % PTCH Place 1 patch onto the skin in the morning and at bedtime.  [name] LORazepam (ATIVAN) 2 MG tablet Take 2 mg by mouth 4 (four) times daily as needed for anxiety.   [name] losartan (COZAAR) 25 MG tablet Take 1 tablet (25 mg total) by mouth daily.  . metFORMIN (GLUCOPHAGE) 500 MG tablet Take 1 tablet (500 mg total) by mouth 2 (two) times daily with a meal.  . Multiple Vitamin (MULTIVITAMIN WITH MINERALS) TABS tablet Take 1 tablet by mouth daily.   [name] NARCAN 4 MG/0.1ML LIQD nasal spray kit Place 1 spray into the nose as directed.  . Norgestimate-Ethinyl Estradiol Triphasic (TRI-ESTARYLLA) 0.18/0.215/0.25 MG-35 MCG tablet Take 1 tablet by mouth daily.  . ondansetron (ZOFRAN) 4 MG tablet Take 4 mg by mouth as needed for nausea.   [name] PAIN MANAGEMENT INTRATHECAL, IT, PUMP 1 each by Intrathecal route continuous. Intrathecal (IT) medication: fentanyl, clonidine, bupivicaine  Fentanyl 260m/bupivicaine150mg/clonidine67mg  51.9667mper day, 67m27mer day, 1.24m64m5.4ml/69mn  . polyethylene glycol powder (GLYCOLAX/MIRALAX) 17 GM/SCOOP powder MIX 17 GRAMS IN 8 OUNCES OF LIQUID AND DRINK DAILY FOR BOWELS  . promethazine (PHENERGAN) 25 MG tablet Take 1 tablet (25 mg total) by mouth every 6 (six) hours as needed for nausea or vomiting.  . QUEMarland Kitcheniapine (SEROQUEL XR) 400 MG 24 hr tablet Take 400 mg by mouth 2 (two) times daily.  . QUEtiapine (SEROQUEL) 300 MG tablet Take 300 mg by mouth at bedtime.  . tiZMarland KitchenNidine (ZANAFLEX) 4 MG tablet Take 4 mg by mouth every 4 (four) hours.  . traZODone (DESYREL) 100 MG tablet 5 qhs prescribed by psychiatry (Patient taking differently: Take  100-200 mg by mouth at bedtime. 5 qhs prescribed by psychiatry)  . triamcinolone cream (KENALOG) 0.1 % Apply 1 application topically daily as needed (for itching).  . vitamin C (ASCORBIC ACID) 250 MG tablet Take 250 mg by mouth at bedtime.  . Vitamin D, Ergocalciferol, (DRISDOL) 1.25 MG (50000 UNIT) CAPS capsule TAKE ONE CAPSULE BY MOUTH 2 TIMES A WEEK(SUNDAY AND THURSDAY)  . VRAYLAR 6 MG CAPS Take 6 mg by mouth daily.    No facility-administered encounter medications on file as of 06/21/2020.     Current Diagnosis/Assessment:  Goals Addressed              This Visit's Progress   .  Pharmacy Care Plan (pt-stated)        CARE PLAN ENTRY (see longitudinal plan of care for additional care plan information)  Current Barriers:  .  Chronic Disease Management support, education, and care coordination needs related to Hypertension, Hyperlipidemia, and Diabetes   Hypertension BP Readings from Last 3 Encounters:  05/22/20 120/76  10/28/19 118/80  10/12/19 118/80   . Pharmacist Clinical Goal(s): o Over the next 90 days, patient will work with PharmD and providers to maintain BP goal <130/80 . Current regimen:  o Losartan 25 MG tablet take daily  o Hydrochlorothiazide 12.5 MG take daily o Atenolol 100 MG tablet take daily o Clonidine 0.1 MG  take 1 tablet three times a day as needed  . Interventions: o Maintain heart healthy, balanced diet o Patient receiving a BP cuff through her insurance program . Patient self care activities - Over the next 90 days, patient will: o Check BP 2-3 times per week, document, and provide at future appointments o Ensure daily salt intake < 2300 mg/day  Hyperlipidemia Lab Results  Component Value Date/Time   LDLCALC 48 04/29/2019 03:53 PM   . Pharmacist Clinical Goal(s): o Over the next 90 days, patient will work with PharmD and providers to maintain LDL goal < 70 . Current regimen:  o Atorvastatin 10 MG -Take daily at  bedtime . Interventions: o Using her BP cuff at least 2-3 times per week  . Patient self care activities - Over the next 90 days, patient will: o Continue to limit fried foods o Take medications daily as directed o Try to exercise for 30 minutes daily 5 times per week (150 minutes per week total)  Diabetes Lab Results  Component Value Date/Time   HGBA1C 6.7 (H) 10/12/2019 05:03 PM   HGBA1C 6.5 (H) 04/29/2019 03:53 PM   . Pharmacist Clinical Goal(s): o Over the next 90 days, patient will work with PharmD and providers to maintain A1c goal <7% . Current regimen:  o Metformin 500 MG take twice daily with a meal . Interventions: o Encouraged continuation of diabetic-friendly, balanced diet . Patient self care activities - Over the next 90 days, patient will: o Check blood sugar twice daily, document, and provide at future appointments o Contact provider with any episodes of hypoglycemia  Vitamin D Deficiency . Pharmacist Clinical Goal(s) o Over the next 90 days, patient will work with PharmD to increase Vitamin D levels in patient to greater than  . Current regimen:  o Vitamin D Ergocalciferol 1.25 MG -Take one capsule by mouth two times a week on Sunday and Thursday  . Interventions: o Collaborate with provider team to determine if patient should still be taking Vitamin D.  . Patient self care activities - Over the next 90 days, patient will: o Continue to go outside on sunny days   Medication management . Pharmacist Clinical Goal(s): o Over the next 90 days, patient will work with PharmD and providers to achieve optimal medication adherence . Current pharmacy: [facility]  . Interventions o Comprehensive medication review performed. o Continue current medication management strategy . Patient self care activities - Over the next 90 days, patient will: o Focus on medication adherence by using a medication reminder system o Take medications as prescribed o Report any  questions or concerns to PharmD and/or provider(s)  Initial goal documentation        Hypertension   BP today is:  <130/80  Office blood pressures are  BP Readings from Last 3 Encounters:  05/22/20 120/76  10/28/19 118/80  10/12/19 118/80    Patient has failed these meds in the past:  Patient is currently controlled on the following medications:  .  Atenolol 100 MG - Taking daily  . Hydrochlorothiazide 12.5 MG- taking daily  . Losartan 25 MG- Taking 1 tablet daily   Patient checks BP at home infrequently  Patient home BP readings are ranging: she does not write down her BP readings   We discussed:  Monitoring her BP at home   Pt reports that she is eligible for a BP cuff through insurance   Recording her BP readings once she is able to get the cuff    Plan  Continue current medications   Hyperlipidemia   LDL goal < 70  Last lipids Lab Results  Component Value Date   CHOL 111 04/29/2019   HDL 45 04/29/2019   LDLCALC 48 04/29/2019   TRIG 94 04/29/2019   CHOLHDL 2.5 04/29/2019   Hepatic Function Latest Ref Rng & Units 09/17/2018 08/13/2018 03/19/2018  Total Protein 6.0 - 8.5 g/dL 7.7 8.2(H) 7.2  Albumin 3.8 - 4.9 g/dL 4.6 4.3 3.9  AST 0 - 40 IU/L 9 51(H) 19  ALT 0 - 32 IU/L 9 24 16   Alk Phosphatase 39 - 117 IU/L 110 79 67  Total Bilirubin 0.0 - 1.2 mg/dL <0.2 1.1 0.7     The ASCVD Risk score (Kimball., et al., 2013) failed to calculate for the following reasons:   The valid total cholesterol range is 130 to 320 mg/dL   Patient has failed these meds in past: none  Patient is currently controlled on the following medications:  . Atorvastatin 10 MG- Taking daily   We discussed:  diet and exercise extensively  Plan  Continue current medications  Diabetes   A1c goal <7%  Recent Relevant Labs: Lab Results  Component Value Date/Time   HGBA1C 6.7 (H) 10/12/2019 05:03 PM   HGBA1C 6.5 (H) 04/29/2019 03:53 PM    Last diabetic Eye exam: No results  found for: HMDIABEYEEXA  Last diabetic Foot exam: No results found for: HMDIABFOOTEX   Checking BG: Weekly - checking on Monday   Recent FBG Readings:  98, 101, 129, 140   Patient has failed these meds in past: none noted  Patient is currently controlled on the following medications: [name] Metformin 500 MG Tablet - taking 1 tablet by mouth twice per day   We discussed:   Signs and symptoms of hypoglycemia   How to treat hypoglycemia   Dietary: Patient reports eating one meal a day   Increasing the amount of meals she was eating to three smaller meals a day   Plan  Continue current medications  GERD   Patient has failed these meds in past:None noted  Patient is currently controlled on the following medications:  . Dexilant 60 MG capsule- Taking daily   We discussed:    -Avoiding fried foods             -Not laying down immediately after meals   Plan  Continue current medications  Chronic Pain    Patient has failed these meds in past:  Patient is currently controlled on the following medications:    . Hydromorphone - Take 4 mg by mouth 4 (four) times daily as needed for severe pain. . Intrathecal Pain Pump  - Fentanyl 243m/bupivicaine150mg/clonidine41mg               51.969mper day, 41m58mer day, 1.106m7m15.4ml/17mn  Narcan  MG/0.1 ML Liquid Nasal Spray- Place 1 spray into the nose as directed   We discussed:    Pt seen by pain doctor  frequently   Scheduled for follow up appointment this month  Patient reports pain is tolerable 4/10   Discussed how to use Narcan spray   Plan  Continue current medications, continue to be seen on a regular basis by pain specialist   Vitamin D Deficiency    Patient has failed these meds in past:   Lab Results  Component Value Date   VD25OH 142.0 (H) 10/12/2019   VD25OH 100.0 02/23/2019   VD25OH 26.7 (L) 10/06/2018    Patient is currently uncontrolled on the following medications:   [name]  Vitamin D, Ergocalciferol,  (DRISDOL) 1.25 MG (50000 UNIT) CAPS capsule - Take 1 capsule by mouth two times a week Sunday and Thursday.    Plan  Will discuss with provider discontinuation of Vitamin D. Patient needs more recent lab work completed.   Vaccines   Reviewed and discussed patient's vaccination history.    Immunization History  Administered Date(s) Administered  . Influenza Inj Mdck Quad With Preservative 05/11/2018  . Influenza,inj,Quad PF,6+ Mos 09/19/2016, 04/24/2017   Patient got first dose of COVID-19 vaccine at [facility] will get second dose on 06/26/2020  Plan Recommended patient receive pneumonia vaccine in office.   SCHIZOAFFECTIVE DISORDER   Patient has failed these meds in past: VRAYLAR 6 MG CAPS Patient is currently controlled on the following medications:  . CAPLYTA 42 MG CAPS- TAKING 1 DAILY . QUETIAPINE  300 MG- TAKING 1 TABLET BY MOUTH AT BEDTIME . QUETIAPINE XR 400 MG- TAKING 1 TABLET BY MOUTH TWICE PER DAY  . PRISTIQ 50 MG - TAKING 1 TABLET DAILY  . OLANZAPINE 5 MG- TAKING 1 TABLET DAILY . TRINTELLIX 20 MG TABLET- TAKING  .   We discussed:   -Patient seen by Dr. Dorethea Clan psychiatrist every 2 months  -She sees her therapist once or twice month Dr. Abagail Kitchens  -She feels supported by her team  -She has not noticed any issues with her medications  -  Plan  Continue current medications. Continue to be seen by psychiatrist on a consistent basis.   Medication Management   Patient's preferred pharmacy is:  [facility], [address] [place] [address] Phone: [phone] Fax: [phone]  [facility] [place], [address] [place] [address] Phone: [phone] Fax: [phone]  Uses pill box? Yes Pt endorses 100% compliance  We discussed: Discussed benefits of medication synchronization, packaging and  delivery as well as enhanced pharmacist oversight with [facility]. At this time the patient is not interested in switching pharmacies.   Plan  Continue current medication management strategy. Will discuss [facility] pharmacy again at a later date.     Follow up: 1 month phone visit  Orlando Penner, PharmD Clinical Pharmacist [facility] [phone]

## 2020-06-26 ENCOUNTER — Other Ambulatory Visit: Payer: Medicare Other | Admitting: Hospice

## 2020-06-26 ENCOUNTER — Other Ambulatory Visit: Payer: Self-pay

## 2020-06-26 DIAGNOSIS — Z515 Encounter for palliative care: Secondary | ICD-10-CM

## 2020-06-26 DIAGNOSIS — F250 Schizoaffective disorder, bipolar type: Secondary | ICD-10-CM

## 2020-06-26 NOTE — Progress Notes (Signed)
[facility] Consult Note Telephone: [phone]  Fax: [phone]  PATIENT NAME: Linda Mercado DOB: 11/21/1965 MRN: [id number]  PRIMARY CARE PROVIDER:   [name], [place], [place], [place] [address] [place],  [place] [address]  REFERRING PROVIDER: [name], [place], [place], [place],  [place] [address]  RESPONSIBLE PARTY:Self [phone] Prefers to be called Linda Mercado Lives with God daughter - [phone] Twin children live at home   TELEHEALTH VISIT STATEMENT Due to the COVID-19 crisis, this visit was done via telephone from my office. It was initiated and consented to by this patient and/or family.  RECOMMENDATIONS/PLAN:  Advance Care Planning/Goals of Care: Telehealth Visit to build trust and follow-up on palliative care.   CODE STATUS: CODE STATUS reviewed today; she affirmed that she remains a FULL CODE. Visit consisted of counseling and education dealing with the complex and emotionally intense issues of symptom management and palliative care in the setting of serious and potentially life-threatening illness. Palliative care team will continue to support patient, patient's family, and medical team. Goals of care: Goals of care include to maximize quality of life and symptom management.  Follow BX:IDHWYSHUOH care will continue to follow patient for goals of care clarification and symptom management. Follow-up in 4 months.  Patient continues to prefer telehealth medicine at this time.  Symptom management: Chronic pain syndrome:  Patient continues on pain pump with no reported adverse effects; continue with pain clinic and keep appointments as scheduled.   Hallucination: She denies hallucinating, hearing voices in the last month. She is followed by her psychiatrist and is compliant with medications as prescribed.    She is  compliant with her medications and has follow appointment with Psychiatrist next month.  Constipation: Denies constipation. Continue Amitza and Miralax. No hospitalizations since last visit. Encouraged ongoing care. I spent3mnutes providing this consultation; time includes time spent with patient, chart review and documentation. More than 50% of the time in this consultation was spent on counseling and coordinating communication  HISTORY OF PRESENT ILLNESS:Linda Y Loganis a 54y.o.year oldfemalewith multiple medical problems including Schizoaffective disorder, Bipolar 1, Type 2 DM HTN , chronic pain syndrome, Endometriosis determined by laparoscopy. Palliative Care was asked to help address goals of care.   CODE STATUS:Full  PPS:50% HOSPICE ELIGIBILITY/DIAGNOSIS: TBD  PAST MEDICAL HISTORY:  Past Medical History:  Diagnosis Date  . Acid reflux   . Bradycardia   . Chronic back pain   . Diabetes mellitus without complication ([place]   . Hypertension   . Hypokalemia     SOCIAL HX:  Social History   Tobacco Use  . Smoking status: Never Smoker  . Smokeless tobacco: Never Used  Substance Use Topics  . Alcohol use: Not Currently    ALLERGIES:  Allergies  Allergen Reactions  . Lisinopril Hives    Other reaction(s): anaphylaxis/angioedema  . Latex Hives  . Morphine And Related     Itching, skin get hot.   . Adhesive [Tape] Rash     PERTINENT MEDICATIONS:  Outpatient Encounter Medications as of 06/26/2020  Medication Sig  . ACCU-CHEK GUIDE test strip TEST TWICE DAILY AS DIRECTED  . albuterol (VENTOLIN HFA) 108 (90 Base) MCG/ACT inhaler INHALE 2 PUFFS INTO THE LUNGS EVERY 6 HOURS AS NEEDED FOR WHEEZING OR SHORTNESS OF BREATH  . AMITIZA 24 MCG capsule Take 24 mcg by mouth 2 (two) times  daily.   . atenolol (TENORMIN) 100 MG tablet Take 1 tablet (100 mg total) by mouth daily.  [name] atorvastatin (LIPITOR) 10 MG tablet Take 1 tablet (10 mg total) by mouth at bedtime.  .  Blood Glucose Monitoring Suppl (ACCU-CHEK GUIDE) w/Device KIT USE TO CHECK BLOOD SUGAR TWICE DAILY  . calcium gluconate 500 MG tablet Take 1 tablet (500 mg total) by mouth 3 (three) times daily.  . CAPLYTA 42 MG CAPS Take 42 mg by mouth at bedtime.   . clonazePAM (KLONOPIN) 1 MG tablet Take 1 mg by mouth 3 (three) times daily.  . Cyanocobalamin (B-12) 500 MCG SUBL One SL QD  . DEXILANT 60 MG capsule Take 1 capsule by mouth daily. (Patient taking differently: Take 60 mg by mouth daily. )  . diclofenac Sodium (VOLTAREN) 1 % GEL APPLY 4 GRAMS TO THE AFFECTED AREA FOUR TIMES DAILY  . docusate sodium (COLACE) 50 MG capsule Take 50 mg by mouth daily.   [name] ELDERBERRY PO Take 1 tablet by mouth at bedtime.  . fluticasone (FLOVENT HFA) 110 MCG/ACT inhaler Inhale 2 puffs into the lungs every 12 (twelve) hours.  . hydrochlorothiazide (HYDRODIURIL) 12.5 MG tablet TAKE 1 TABLET(12.5 MG) BY MOUTH DAILY  . HYDROmorphone (DILAUDID) 4 MG tablet Take 4 mg by mouth 4 (four) times daily as needed for severe pain.  . hydrOXYzine (ATARAX/VISTARIL) 25 MG tablet Take 25 mg by mouth in the morning, at noon, in the evening, and at bedtime.   . Lancets (ACCU-CHEK MULTICLIX) lancets Use as instructed  . lidocaine (XYLOCAINE) 2 % solution Use as directed 15 mLs in the mouth or throat 3 (three) times a week.   . Lidocaine 4 % PTCH Place 1 patch onto the skin in the morning and at bedtime.  [name] LORazepam (ATIVAN) 2 MG tablet Take 2 mg by mouth 4 (four) times daily as needed for anxiety.   [name] losartan (COZAAR) 25 MG tablet Take 1 tablet (25 mg total) by mouth daily.  . metFORMIN (GLUCOPHAGE) 500 MG tablet Take 1 tablet (500 mg total) by mouth 2 (two) times daily with a meal.  . Multiple Vitamin (MULTIVITAMIN WITH MINERALS) TABS tablet Take 1 tablet by mouth daily.   [name] NARCAN 4 MG/0.1ML LIQD nasal spray kit Place 1 spray into the nose as directed.  . Norgestimate-Ethinyl Estradiol Triphasic (TRI-ESTARYLLA) 0.18/0.215/0.25 MG-35 MCG  tablet Take 1 tablet by mouth daily.  . ondansetron (ZOFRAN) 4 MG tablet Take 4 mg by mouth as needed for nausea.   [name] PAIN MANAGEMENT INTRATHECAL, IT, PUMP 1 each by Intrathecal route continuous. Intrathecal (IT) medication: fentanyl, clonidine, bupivicaine  Fentanyl 2256m/bupivicaine150mg/clonidine56mg  51.953mper day, 56m87mer day, 1.74m60m5.4ml/8mn  . polyethylene glycol powder (GLYCOLAX/MIRALAX) 17 GM/SCOOP powder MIX 17 GRAMS IN 8 OUNCES OF LIQUID AND DRINK DAILY FOR BOWELS  . promethazine (PHENERGAN) 25 MG tablet Take 1 tablet (25 mg total) by mouth every 6 (six) hours as needed for nausea or vomiting.  . QUEMarland Kitcheniapine (SEROQUEL XR) 400 MG 24 hr tablet Take 400 mg by mouth 2 (two) times daily.  . QUEtiapine (SEROQUEL) 300 MG tablet Take 300 mg by mouth at bedtime.  . tiZMarland KitchenNidine (ZANAFLEX) 4 MG tablet Take 4 mg by mouth every 4 (four) hours.  . traZODone (DESYREL) 100 MG tablet 5 qhs prescribed by psychiatry (Patient taking differently: Take 100-200 mg by mouth at bedtime. 5 qhs prescribed by psychiatry)  . triamcinolone cream (KENALOG) 0.1 % Apply 1 application topically daily as needed (for  itching).  . TRINTELLIX 20 MG TABS tablet Take 20 mg by mouth daily.  . vitamin C (ASCORBIC ACID) 250 MG tablet Take 250 mg by mouth at bedtime.  . Vitamin D, Ergocalciferol, (DRISDOL) 1.25 MG (50000 UNIT) CAPS capsule TAKE ONE CAPSULE BY MOUTH 2 TIMES A WEEK(SUNDAY AND THURSDAY)  . VRAYLAR 6 MG CAPS Take 6 mg by mouth daily.  (Patient not taking: Reported on 06/21/2020)   No facility-administered encounter medications on file as of 06/26/2020.    Note:  Portions of this note were generated with L[employer]. Dictation errors may occur despite attempts at proofreading.  Teodoro Spray, NP

## 2020-06-27 ENCOUNTER — Other Ambulatory Visit: Payer: Medicare Other

## 2020-07-06 ENCOUNTER — Ambulatory Visit: Payer: Medicare Other

## 2020-07-06 DIAGNOSIS — I10 Essential (primary) hypertension: Secondary | ICD-10-CM

## 2020-07-06 DIAGNOSIS — E119 Type 2 diabetes mellitus without complications: Secondary | ICD-10-CM

## 2020-07-06 NOTE — Chronic Care Management (AMB) (Signed)
  Chronic Care Management    Social Work CCM Outreach Note  07/06/2020 Name: LEE-ANN GAL MRN: [id number] DOB: 08-03-1965  ZEEVA COURSER is a 54 y.o. year old female who is a primary care patient of [name], [place] . The CCM team was consulted for assistance with care coordination.    SW placed a successful outbound call to the patient to assist with resource needs. Patient reports she is not feeling well and requests a call at a later time.    Follow Up Plan: Appointment scheduled for SW follow up with client by phone on: 07/20/20  Daneen Schick, [place], CDP Social Worker, Certified Dementia Practitioner [facility] [phone]

## 2020-07-09 NOTE — Patient Instructions (Signed)
Visit Information  Goals Addressed              This Visit's Progress   .  Pharmacy Care Plan (pt-stated)        CARE PLAN ENTRY (see longitudinal plan of care for additional care plan information)  Current Barriers:  . Chronic Disease Management support, education, and care coordination needs related to Hypertension, Hyperlipidemia, and Diabetes   Hypertension BP Readings from Last 3 Encounters:  05/22/20 120/76  10/28/19 118/80  10/12/19 118/80   . Pharmacist Clinical Goal(s): o Over the next 90 days, patient will work with PharmD and providers to maintain BP goal <130/80 . Current regimen:  o Losartan 25 MG tablet take daily  o Hydrochlorothiazide 12.5 MG take daily o Atenolol 100 MG tablet take daily o Clonidine 0.1 MG  take 1 tablet three times a day as needed  . Interventions: o Maintain heart healthy, balanced diet o Patient receiving a BP cuff through her insurance program . Patient self care activities - Over the next 90 days, patient will: o Check BP 2-3 times per week, document, and provide at future appointments o Ensure daily salt intake < 2300 mg/day  Hyperlipidemia Lab Results  Component Value Date/Time   LDLCALC 48 04/29/2019 03:53 PM   . Pharmacist Clinical Goal(s): o Over the next 90 days, patient will work with PharmD and providers to maintain LDL goal < 70 . Current regimen:  o Atorvastatin 10 MG -Take daily at bedtime . Interventions: o Using her BP cuff at least 2-3 times per week  . Patient self care activities - Over the next 90 days, patient will: o Continue to limit fried foods o Take medications daily as directed o Try to exercise for 30 minutes daily 5 times per week (150 minutes per week total)  Diabetes Lab Results  Component Value Date/Time   HGBA1C 6.7 (H) 10/12/2019 05:03 PM   HGBA1C 6.5 (H) 04/29/2019 03:53 PM   . Pharmacist Clinical Goal(s): o Over the next 90 days, patient will work with PharmD and providers to maintain  A1c goal <7% . Current regimen:  o Metformin 500 MG take twice daily with a meal . Interventions: o Encouraged continuation of diabetic-friendly, balanced diet . Patient self care activities - Over the next 90 days, patient will: o Check blood sugar twice daily, document, and provide at future appointments o Contact provider with any episodes of hypoglycemia  Vitamin D Deficiency . Pharmacist Clinical Goal(s) o Over the next 90 days, patient will work with PharmD to increase Vitamin D levels in patient to greater than  . Current regimen:  o Vitamin D Ergocalciferol 1.25 MG -Take one capsule by mouth two times a week on Sunday and Thursday  . Interventions: o Collaborate with provider team to determine if patient should still be taking Vitamin D.  . Patient self care activities - Over the next 90 days, patient will: o Continue to go outside on sunny days   Medication management . Pharmacist Clinical Goal(s): o Over the next 90 days, patient will work with PharmD and providers to achieve optimal medication adherence . Current pharmacy: [facility]  . Interventions o Comprehensive medication review performed. o Continue current medication management strategy . Patient self care activities - Over the next 90 days, patient will: o Focus on medication adherence by using a medication reminder system o Take medications as prescribed o Report any questions or concerns to PharmD and/or provider(s)  Initial goal documentation   Coralyn Helling  Yannick Steuber, PharmD Clinical Pharmacist [facility] [phone]         The patient verbalized understanding of instructions, educational materials, and care plan provided today and declined offer to receive copy of patient instructions, educational materials, and care plan.   The pharmacy team will reach out to the patient again over the next 30 days.    Orlando Penner, PharmD Clinical Pharmacist [facility] [phone]

## 2020-07-19 ENCOUNTER — Encounter: Payer: Self-pay | Admitting: Nurse Practitioner

## 2020-07-19 ENCOUNTER — Other Ambulatory Visit: Payer: Self-pay

## 2020-07-19 ENCOUNTER — Ambulatory Visit (INDEPENDENT_AMBULATORY_CARE_PROVIDER_SITE_OTHER): Payer: Medicare Other | Admitting: Nurse Practitioner

## 2020-07-19 DIAGNOSIS — R0981 Nasal congestion: Secondary | ICD-10-CM | POA: Diagnosis not present

## 2020-07-19 DIAGNOSIS — Z1152 Encounter for screening for COVID-19: Secondary | ICD-10-CM | POA: Diagnosis not present

## 2020-07-19 DIAGNOSIS — R059 Cough, unspecified: Secondary | ICD-10-CM

## 2020-07-19 MED ORDER — BENZONATATE 100 MG PO CAPS: 100.0000 mg | ORAL_CAPSULE | Freq: Four times a day (QID) | ORAL | 1 refills | Status: AC | PRN

## 2020-07-19 NOTE — Patient Instructions (Signed)
° ° °  Person Under Monitoring Name: Linda Mercado  Location: [address] [place] [address]   CORONAVIRUS DISEASE 2019 (COVID-19) Guidance for Persons Under Investigation You are being tested for the virus that causes coronavirus disease 2019 (COVID-19). Public health actions are necessary to ensure protection of your health and the health of others, and to prevent further spread of infection. COVID-19 is caused by a virus that can cause symptoms, such as fever, cough, and shortness of breath. The primary transmission from person to person is by coughing or sneezing. On August 20, 2018, the World Health Organization announced a [employer] Emergency of International Concern and on August 21, 2018 the U.S. Department of Health and Human Services declared a public health emergency. If the virus that causesCOVID-19 spreads in the community, it could have severe public health consequences.  As a person under investigation for COVID-19, the [employer] of Health and [employer], Division of [employer] advises you to adhere to the following guidance until your test results are reported to you. If your test result is positive, you will receive additional information from your provider and your local health department at that time.   Remain at home until you are cleared by your health provider or public health authorities.   Keep a log of visitors to your home using the form provided. Any visitors to your home must be aware of your isolation status.  If you plan to move to a new address or leave the county, notify the local health department in your county.  Call a doctor or seek care if you have an urgent medical need. Before seeking medical care, call ahead and get instructions from the provider before arriving at the medical office, clinic or hospital. Notify them that you are being tested for the virus that causes COVID-19 so arrangements can be made, as  necessary, to prevent transmission to others in the healthcare setting. Next, notify the local health department in your county.  If a medical emergency arises and you need to call 911, inform the first responders that you are being tested for the virus that causes COVID-19. Next, notify the local health department in your county.  Adhere to all guidance set forth by the [facility] of [employer] for [facility] of patients that is based on guidance from the Center for Disease Control and Prevention with suspected or confirmed COVID-19. It is provided with this guidance for Persons Under Investigation.  Your health and the health of our community are our top priorities. Public Health officials remain available to provide assistance and counseling to you about COVID-19 and compliance with this guidance.  Provider: ____________________________________________________________ Date: ______/_____/_________  By signing below, you acknowledge that you have read and agree to comply with this Guidance for Persons Under Investigation. ______________________________________________________________ Date: ______/_____/_________  WHO DO I CALL? You can find a list of local health departments here: [email or web address] Health Department: ____________________________________________________________________ Contact Name: ________________________________________________________________________ Telephone: ___________________________________________________________________________  Nedra Hai, Division of Public Health, Communicable Disease Branch COVID-19 Guidance for Persons Under Investigation September 26, 2018

## 2020-07-19 NOTE — Progress Notes (Signed)
This visit occurred during the SARS-CoV-2 public health emergency.  Safety protocols were in place, including screening questions prior to the visit, additional usage of staff PPE, and extensive cleaning of exam room while observing appropriate contact time as indicated for disinfecting solutions.  Subjective:     Patient ID: Linda Mercado , female    DOB: 1966-04-14 , 54 y.o.   MRN: [id number]   Chief Complaint  Patient presents with  . covid symptoms    Patient stated she has been having a cough and postnasal drip since Sunday.     HPI  Patient is being seen outside due to having symptoms of covid since Sunday.  She reports she and her daughters went shopping on Friday and then on Sunday began having congestion, cough and fatigue.  She has been taken theraflu and Robitussin with some relief. She has not had a loss of taste or smell, denies shortness of breath. She reports she is fully vaccinated but does not know the date.     Past Medical History:  Diagnosis Date  . Acid reflux   . Bradycardia   . Chronic back pain   . Diabetes mellitus without complication ([place])   . Hypertension   . Hypokalemia      Family History  Problem Relation Age of Onset  . Diabetes Father   . Hypertension Father   . Kidney disease Mother   . Hypertension Mother   . Bipolar disorder Sister   . Schizophrenia Sister   . Neuropathy Sister   . Hypertension Sister   . Bipolar disorder Brother   . Bipolar disorder Brother   . Schizophrenia Brother   . Bipolar disorder Daughter   . Post-traumatic stress disorder Daughter   . Asthma Daughter   . Depression Daughter   . Bipolar disorder Daughter   . Post-traumatic stress disorder Daughter   . Asthma Daughter   . Depression Daughter   . Asthma Daughter   . Depression Daughter   . Asthma Daughter   . Stroke Neg Hx   . Cancer Neg Hx   . CAD Neg Hx      Current Outpatient Medications:  .  ACCU-CHEK GUIDE test strip, TEST TWICE DAILY AS  DIRECTED, Disp: 100 strip, Rfl: 2 .  albuterol (VENTOLIN HFA) 108 (90 Base) MCG/ACT inhaler, INHALE 2 PUFFS INTO THE LUNGS EVERY 6 HOURS AS NEEDED FOR WHEEZING OR SHORTNESS OF BREATH, Disp: 20.1 g, Rfl: 1 .  AMITIZA 24 MCG capsule, Take 24 mcg by mouth 2 (two) times daily. , Disp: , Rfl:  .  atenolol (TENORMIN) 100 MG tablet, Take 1 tablet (100 mg total) by mouth daily., Disp: 90 tablet, Rfl: 1 .  atorvastatin (LIPITOR) 10 MG tablet, Take 1 tablet (10 mg total) by mouth at bedtime., Disp: 90 tablet, Rfl: 1 .  benzonatate (TESSALON PERLES) 100 MG capsule, Take 1 capsule (100 mg total) by mouth every 6 (six) hours as needed., Disp: 30 capsule, Rfl: 1 .  Blood Glucose Monitoring Suppl (ACCU-CHEK GUIDE) w/Device KIT, USE TO CHECK BLOOD SUGAR TWICE DAILY, Disp: 1 kit, Rfl: 3 .  CAPLYTA 42 MG CAPS, Take 42 mg by mouth at bedtime. , Disp: , Rfl:  .  clonazePAM (KLONOPIN) 1 MG tablet, Take 1 mg by mouth 3 (three) times daily., Disp: , Rfl:  .  DEXILANT 60 MG capsule, Take 1 capsule by mouth daily. (Patient taking differently: Take 60 mg by mouth daily.), Disp: 90 capsule, Rfl: 0 .  diclofenac  Sodium (VOLTAREN) 1 % GEL, APPLY 4 GRAMS TO THE AFFECTED AREA FOUR TIMES DAILY, Disp: 400 g, Rfl: 1 .  docusate sodium (COLACE) 50 MG capsule, Take 50 mg by mouth daily. , Disp: , Rfl:  .  fluticasone (FLOVENT HFA) 110 MCG/ACT inhaler, Inhale 2 puffs into the lungs every 12 (twelve) hours., Disp: 3 each, Rfl: 1 .  hydrochlorothiazide (HYDRODIURIL) 12.5 MG tablet, TAKE 1 TABLET(12.5 MG) BY MOUTH DAILY, Disp: 90 tablet, Rfl: 0 .  HYDROmorphone (DILAUDID) 4 MG tablet, Take 4 mg by mouth 4 (four) times daily as needed for severe pain., Disp: , Rfl:  .  hydrOXYzine (ATARAX/VISTARIL) 25 MG tablet, Take 25 mg by mouth in the morning, at noon, in the evening, and at bedtime., Disp: , Rfl:  .  Lancets (ACCU-CHEK MULTICLIX) lancets, Use as instructed, Disp: 100 each, Rfl: 12 .  lidocaine (XYLOCAINE) 2 % solution, Use as directed  15 mLs in the mouth or throat 3 (three) times a week. , Disp: , Rfl:  .  Lidocaine 4 % PTCH, Place 1 patch onto the skin in the morning and at bedtime., Disp: 60 patch, Rfl: 5 .  LORazepam (ATIVAN) 2 MG tablet, Take 2 mg by mouth 4 (four) times daily as needed for anxiety. , Disp: , Rfl:  .  losartan (COZAAR) 25 MG tablet, Take 1 tablet (25 mg total) by mouth daily., Disp: 90 tablet, Rfl: 1 .  metFORMIN (GLUCOPHAGE) 500 MG tablet, Take 1 tablet (500 mg total) by mouth 2 (two) times daily with a meal., Disp: 180 tablet, Rfl: 1 .  Multiple Vitamin (MULTIVITAMIN WITH MINERALS) TABS tablet, Take 1 tablet by mouth daily. , Disp: , Rfl:  .  NARCAN 4 MG/0.1ML LIQD nasal spray kit, Place 1 spray into the nose as directed., Disp: , Rfl: 0 .  Norgestimate-Ethinyl Estradiol Triphasic (TRI-ESTARYLLA) 0.18/0.215/0.25 MG-35 MCG tablet, Take 1 tablet by mouth daily., Disp: 90 tablet, Rfl: 1 .  ondansetron (ZOFRAN) 4 MG tablet, Take 4 mg by mouth as needed for nausea. , Disp: , Rfl:  .  PAIN MANAGEMENT INTRATHECAL, IT, PUMP, 1 each by Intrathecal route continuous. Intrathecal (IT) medication: fentanyl, clonidine, bupivicaine  Fentanyl 247m/bupivicaine150mg/clonidine70mg  51.997mper day, 70m33mer day, 1.31m75m5.4ml/79mn, Disp: , Rfl:  .  promethazine (PHENERGAN) 25 MG tablet, Take 1 tablet (25 mg total) by mouth every 6 (six) hours as needed for nausea or vomiting., Disp: 90 tablet, Rfl: 0 .  QUEtiapine (SEROQUEL XR) 400 MG 24 hr tablet, Take 400 mg by mouth 2 (two) times daily., Disp: , Rfl:  .  QUEtiapine (SEROQUEL) 300 MG tablet, Take 300 mg by mouth at bedtime., Disp: , Rfl:  .  tiZANidine (ZANAFLEX) 4 MG tablet, Take 4 mg by mouth every 4 (four) hours., Disp: , Rfl:  .  traZODone (DESYREL) 100 MG tablet, 5 qhs prescribed by psychiatry (Patient taking differently: Take 100-200 mg by mouth at bedtime. 5 qhs prescribed by psychiatry), Disp: 30 tablet, Rfl: 0 .  triamcinolone cream (KENALOG) 0.1 %, Apply 1  application topically daily as needed (for itching)., Disp: 30 g, Rfl: 2 .  TRINTELLIX 20 MG TABS tablet, Take 20 mg by mouth daily., Disp: , Rfl:  .  VRAYLAR 6 MG CAPS, Take 6 mg by mouth daily., Disp: , Rfl:  .  calcium gluconate 500 MG tablet, Take 1 tablet (500 mg total) by mouth 3 (three) times daily. (Patient not taking: Reported on 07/19/2020), Disp: 270 tablet, Rfl: 1 .  Cyanocobalamin (  B-12) 500 MCG SUBL, One SL QD (Patient not taking: Reported on 07/19/2020), Disp: 90 tablet, Rfl: 1 .  polyethylene glycol powder (GLYCOLAX/MIRALAX) 17 GM/SCOOP powder, MIX 17 GRAMS IN 8 OUNCES OF LIQUID AND DRINK DAILY FOR BOWELS (Patient not taking: Reported on 07/19/2020), Disp: 238 g, Rfl: 1   Allergies  Allergen Reactions  . Lisinopril Hives    Other reaction(s): anaphylaxis/angioedema  . Latex Hives  . Morphine And Related     Itching, skin get hot.   . Adhesive [Tape] Rash     Review of Systems  Constitutional: Negative.   HENT: Positive for congestion and postnasal drip.   Respiratory: Positive for cough and chest tightness. Negative for shortness of breath and wheezing.   Cardiovascular: Positive for chest pain (intermittent chest discomfort when coughing).  Neurological: Positive for light-headedness.  Psychiatric/Behavioral: Negative.      There were no vitals filed for this visit. There is no height or weight on file to calculate BMI.   Objective:  Physical Exam Constitutional:      General: She is not in acute distress.    Appearance: Normal appearance.  HENT:     Head: Normocephalic.  Cardiovascular:     Rate and Rhythm: Normal rate and regular rhythm.     Pulses: Normal pulses.     Heart sounds: Normal heart sounds. No murmur heard.   Pulmonary:     Effort: Pulmonary effort is normal. No respiratory distress.     Breath sounds: Normal breath sounds. No wheezing.  Musculoskeletal:     Cervical back: Normal range of motion.  Skin:    Capillary Refill: Capillary  refill takes less than 2 seconds.  Neurological:     General: No focal deficit present.     Mental Status: She is alert and oriented to person, place, and time.     Cranial Nerves: No cranial nerve deficit.  Psychiatric:        Mood and Affect: Mood normal.        Behavior: Behavior normal.        Thought Content: Thought content normal.        Judgment: Judgment normal.         Assessment And Plan:     1. Cough  She is encouraged to stay well hydrated with water if has shortness of breath or chest pain/leg pain she is to go to ER for evaluation.  Lungs are clear on physical exam. - benzonatate (TESSALON PERLES) 100 MG capsule; Take 1 capsule (100 mg total) by mouth every 6 (six) hours as needed.  Dispense: 30 capsule; Refill: 1  2. Head congestion - benzonatate (TESSALON PERLES) 100 MG capsule; Take 1 capsule (100 mg total) by mouth every 6 (six) hours as needed.  Dispense: 30 capsule; Refill: 1  3. Encounter for screening for COVID-19 Patient evaluated, tested and sent home with instructions for home care and Quarantine. Instructed to seek further care if symptoms worsen.  Is set up with follow up for a virtual visit with PCP in next 24-48 hours. - Novel Coronavirus, NAA ([facility])     Patient was given opportunity to ask questions. Patient verbalized understanding of the plan and was able to repeat key elements of the plan. All questions were answered to their satisfaction.  [name], FNP   I, [name], FNP, have reviewed all documentation for this visit. The documentation on 07/19/20 for the exam, diagnosis, procedures, and orders are all accurate and complete.  THE PATIENT  IS ENCOURAGED TO PRACTICE SOCIAL DISTANCING DUE TO THE COVID-19 PANDEMIC.    

## 2020-07-20 ENCOUNTER — Telehealth: Payer: Medicare Other

## 2020-07-20 ENCOUNTER — Telehealth: Payer: Self-pay

## 2020-07-20 ENCOUNTER — Telehealth: Payer: Self-pay | Admitting: Nurse Practitioner

## 2020-07-20 LAB — NOVEL CORONAVIRUS, NAA: SARS-CoV-2, NAA: DETECTED — AB

## 2020-07-20 LAB — SARS-COV-2, NAA 2 DAY TAT

## 2020-07-20 MED ORDER — AZITHROMYCIN 250 MG PO TABS: ORAL_TABLET | ORAL | 0 refills | Status: AC

## 2020-07-20 NOTE — Telephone Encounter (Signed)
  Chronic Care Management   Outreach Note  07/20/2020 Name: Linda Mercado MRN: [id number] DOB: 1966/01/28  Referred by: [name], FNP Reason for referral : Care Coordination   Linda Mercado is enrolled in a Managed Medicaid Health Plan: No  An unsuccessful telephone outreach was attempted today. The patient was referred to the case management team for assistance with care management and care coordination.   Follow Up Plan: A HIPAA compliant phone message was left for the patient providing contact information and requesting a return call.  The care management team will reach out to the patient again over the next 30 days.   Bevelyn Ngo, BSW, CDP Social Worker, Certified Dementia Practitioner [facility] Care Management [phone]

## 2020-07-20 NOTE — Telephone Encounter (Signed)
Called to inform of her positive results for covid, continue to remain in quarantine for a total of 10 days from your onset of symptoms.she is aware If you have shortness of breath or chest pain or any sudden onset of pain go to the ER for further evaluation. She is requiring frequent use of her albuterol.  I will set up remote health and have the infusion clinic to call her.

## 2020-07-25 ENCOUNTER — Encounter: Payer: Self-pay | Admitting: Nurse Practitioner

## 2020-07-26 ENCOUNTER — Ambulatory Visit: Payer: Self-pay

## 2020-07-26 ENCOUNTER — Telehealth: Payer: Medicare Other

## 2020-07-26 DIAGNOSIS — I10 Essential (primary) hypertension: Secondary | ICD-10-CM

## 2020-07-26 DIAGNOSIS — F250 Schizoaffective disorder, bipolar type: Secondary | ICD-10-CM

## 2020-07-26 DIAGNOSIS — E559 Vitamin D deficiency, unspecified: Secondary | ICD-10-CM

## 2020-07-26 DIAGNOSIS — E119 Type 2 diabetes mellitus without complications: Secondary | ICD-10-CM

## 2020-07-26 DIAGNOSIS — G894 Chronic pain syndrome: Secondary | ICD-10-CM

## 2020-07-26 DIAGNOSIS — E063 Autoimmune thyroiditis: Secondary | ICD-10-CM

## 2020-07-26 DIAGNOSIS — M5416 Radiculopathy, lumbar region: Secondary | ICD-10-CM

## 2020-07-26 DIAGNOSIS — K219 Gastro-esophageal reflux disease without esophagitis: Secondary | ICD-10-CM

## 2020-07-31 NOTE — Chronic Care Management (AMB) (Signed)
Chronic Care Management   Follow Up Note   07/26/2020 Name: Linda Mercado MRN: [id number] DOB: 1966-02-19  Referred by: [name], FNP Reason for referral : Chronic Care Management (RN CM FU Call )   Linda Mercado is a 55 y.o. year old female who is a primary care patient of [name], [place]. The CCM team was consulted for assistance with chronic disease management and care coordination needs.    Review of patient status, including review of consultants reports, relevant laboratory and other test results, and collaboration with appropriate care team members and the patient's provider was performed as part of comprehensive patient evaluation and provision of chronic care management services.    SDOH (Social Determinants of Health) assessments performed: Yes See Care Plan activities for detailed interventions related to [place])   Placed outbound CCM RN CM follow up call with patient for a care plan update.    Outpatient Encounter Medications as of 07/26/2020  Medication Sig Note  . losartan (COZAAR) 50 MG tablet Take 50 mg by mouth daily.   [name] ACCU-CHEK GUIDE test strip TEST TWICE DAILY AS DIRECTED   . albuterol (VENTOLIN HFA) 108 (90 Base) MCG/ACT inhaler INHALE 2 PUFFS INTO THE LUNGS EVERY 6 HOURS AS NEEDED FOR WHEEZING OR SHORTNESS OF BREATH   . AMITIZA 24 MCG capsule Take 24 mcg by mouth 2 (two) times daily.    [name] atenolol (TENORMIN) 100 MG tablet Take 1 tablet (100 mg total) by mouth daily.   [name] atorvastatin (LIPITOR) 10 MG tablet Take 1 tablet (10 mg total) by mouth at bedtime.   . benzonatate (TESSALON PERLES) 100 MG capsule Take 1 capsule (100 mg total) by mouth every 6 (six) hours as needed.   . Blood Glucose Monitoring Suppl (ACCU-CHEK GUIDE) w/Device KIT USE TO CHECK BLOOD SUGAR TWICE DAILY   . calcium gluconate 500 MG tablet Take 1 tablet (500 mg total) by mouth 3 (three) times daily. (Patient not taking: Reported on 07/19/2020)   . CAPLYTA 42 MG CAPS Take 42 mg by mouth at  bedtime.    . clonazePAM (KLONOPIN) 1 MG tablet Take 1 mg by mouth 3 (three) times daily.   . Cyanocobalamin (B-12) 500 MCG SUBL One SL QD (Patient not taking: Reported on 07/19/2020)   . DEXILANT 60 MG capsule Take 1 capsule by mouth daily. (Patient taking differently: Take 60 mg by mouth daily.)   . diclofenac Sodium (VOLTAREN) 1 % GEL APPLY 4 GRAMS TO THE AFFECTED AREA FOUR TIMES DAILY   . docusate sodium (COLACE) 50 MG capsule Take 50 mg by mouth daily.    . fluticasone (FLOVENT HFA) 110 MCG/ACT inhaler Inhale 2 puffs into the lungs every 12 (twelve) hours.   . hydrochlorothiazide (HYDRODIURIL) 12.5 MG tablet TAKE 1 TABLET(12.5 MG) BY MOUTH DAILY   . HYDROmorphone (DILAUDID) 4 MG tablet Take 4 mg by mouth 4 (four) times daily as needed for severe pain.   . hydrOXYzine (ATARAX/VISTARIL) 25 MG tablet Take 25 mg by mouth in the morning, at noon, in the evening, and at bedtime.   . Lancets (ACCU-CHEK MULTICLIX) lancets Use as instructed   . lidocaine (XYLOCAINE) 2 % solution Use as directed 15 mLs in the mouth or throat 3 (three) times a week.    . Lidocaine 4 % PTCH Place 1 patch onto the skin in the morning and at bedtime.   [name] LORazepam (ATIVAN) 2 MG tablet Take 2 mg by mouth 4 (four) times daily as needed  for anxiety.    [name] losartan (COZAAR) 25 MG tablet Take 1 tablet (25 mg total) by mouth daily.   . metFORMIN (GLUCOPHAGE) 500 MG tablet Take 1 tablet (500 mg total) by mouth 2 (two) times daily with a meal.   . Multiple Vitamin (MULTIVITAMIN WITH MINERALS) TABS tablet Take 1 tablet by mouth daily.    [name] NARCAN 4 MG/0.1ML LIQD nasal spray kit Place 1 spray into the nose as directed.   . Norgestimate-Ethinyl Estradiol Triphasic (TRI-ESTARYLLA) 0.18/0.215/0.25 MG-35 MCG tablet Take 1 tablet by mouth daily.   . ondansetron (ZOFRAN) 4 MG tablet Take 4 mg by mouth as needed for nausea.    [name] PAIN MANAGEMENT INTRATHECAL, IT, PUMP 1 each by Intrathecal route continuous. Intrathecal (IT) medication:  fentanyl, clonidine, bupivicaine  Fentanyl 275m/bupivicaine150mg/clonidine34mg  51.974mper day, 34m48mer day, 1.56m37m5.4ml/54mn   . polyethylene glycol powder (GLYCOLAX/MIRALAX) 17 GM/SCOOP powder MIX 17 GRAMS IN 8 OUNCES OF LIQUID AND DRINK DAILY FOR BOWELS (Patient not taking: Reported on 07/19/2020)   . promethazine (PHENERGAN) 25 MG tablet Take 1 tablet (25 mg total) by mouth every 6 (six) hours as needed for nausea or vomiting.   . QUEMarland Kitcheniapine (SEROQUEL XR) 400 MG 24 hr tablet Take 400 mg by mouth 2 (two) times daily.   . QUEtiapine (SEROQUEL) 300 MG tablet Take 300 mg by mouth at bedtime.   . tiZMarland KitchenNidine (ZANAFLEX) 4 MG tablet Take 4 mg by mouth every 4 (four) hours.   . traZODone (DESYREL) 100 MG tablet 5 qhs prescribed by psychiatry (Patient taking differently: Take 100-200 mg by mouth at bedtime. 5 qhs prescribed by psychiatry)   . triamcinolone cream (KENALOG) 0.1 % Apply 1 application topically daily as needed (for itching).   . TRINTELLIX 20 MG TABS tablet Take 20 mg by mouth daily.   . VRAMarland KitchenLAR 6 MG CAPS Take 6 mg by mouth daily. 06/21/2020: D/C by psychiatrist Lida Shelbie Hutchingpatient    No facility-administered encounter medications on file as of 07/26/2020.     Objective:  Lab Results  Component Value Date   HGBA1C 6.7 (H) 10/12/2019   HGBA1C 6.5 (H) 04/29/2019   HGBA1C 6.5 (H) 09/17/2018   Lab Results  Component Value Date   LDLCALC 48 04/29/2019   CREATININE 0.80 10/06/2019   BP Readings from Last 3 Encounters:  05/22/20 120/76  10/28/19 118/80  10/12/19 118/80    Goals Addressed      Patient Stated   .  "I need help at home" (pt-stated)   On track     CARE [place] longitudinal plan of care for additional care plan information)  Current Barriers:  . [place] Kitchen[place] Deficits related to resources for custodial care needs, home delivered meals and transportation  . Chronic Disease Management support and education needs related to Type 2 diabetes Mellitus,  Essential hypertension, Chronic pain syndrome, Hashimoto's disease, Lumbar radiculopathy . Lacks caregiver support . Transportation barriers . Lacks social connections . Unable to perform ADLs independently . Unable to perform IADLs independently  Nurse Case Manager Clinical Goal(s):  . Ove[place]land Kitchen the next 90 days, patient will work with the embedded BSW KendrDaneen Schickddress needs related to caregiver assistance including custodial care, home delivered meals and transportation  CCM RN CM Interventions:  07/26/20 call completed with patient  . Inter-disciplinary care team collaboration (see longitudinal plan of care) . Evaluation of current treatment plan related to level of care concerns and patient's adherence to plan as established by provider .  Determined patient is currently receiving in home services from [place] following recent COVID 19 infection  . Determined she continues to struggle with independent iADL's due to chronic pain syndrome and Impaired Physical Mobility  . Determined patient remains to be engaged with embedded [place] for resources and support of level of care concerns  . Discussed plans with patient for ongoing care management follow up and provided patient with direct contact information for care management team  Patient Self Care Activities:  . Continues to self administer medications as prescribed . Attends all scheduled provider appointments . Call pharmacy for medication refills . Call provider office for new concerns or questions . Continue to stay engaged with embedded BSW for support and resources needed for level of care concerns   Please see past updates related to this goal by clicking on the "Past Updates" button in the selected goal      .  "I need my equipment replaced" (pt-stated)   On track     [place] (see longitudinal plan of care for additional care plan information)  Current Barriers:  . Patient needs replacement  equipment including a manual W/C and 4 pronged cane  . Chronic Disease Management support and education needs related to Type 2 diabetes Mellitus, Essential hypertension, Chronic pain syndrome, Hashimoto's disease, Lumbar radiculopathy  Nurse Case Manager Clinical Goal(s):  [name] Over the next 45 days, patient will work with the CCM team and PCP  to address needs related to care coordination with DME needs  CCM RN CM Interventions:  07/26/20 call completed with patient  . Inter-disciplinary care team collaboration (see longitudinal plan of care) . Evaluation of current treatment plan related to DME needs  and patient's adherence to plan as established by provider. . Determined patient received her replacement manual W/C however, she continues to need a replacement standard walker . Sent secure message to PCP provider [name] FNP requesting an Rx be sent to [place] for a standard walker  . Discussed plans with patient for ongoing care management follow up and provided patient with direct contact information for care management team  Patient Self Care Activities:  . Continue to self administer medications as prescribed . Attend all scheduled provider appointments . Call pharmacy for medication refills . Call provider office for new concerns or questions  Please see past updates related to this goal by clicking on the "Past Updates" button in the selected goal      .  "to get my pain under better control" (pt-stated)   On track     [place] (see longitudinal plan of care for additional care plan information)  Current Barriers:  [name] Knowledge Deficits related to disease process and Self Health management of Chronic Pain  . Chronic Disease Management support and education needs related to Type 2 diabetes Mellitus, Essential hypertension, Chronic pain syndrome, Hashimoto's disease, Lumbar radiculopathy . Lacks caregiver support . Transportation barriers . Lacks social  connections . Unable to perform ADLs independently . Unable to perform IADLs independently  Nurse Case Manager Clinical Goal(s):  [name] Over the next 180 days, patient will work with the CCM team and PCP to address needs related to disease education and support for improved Self Health management of Chronic Pain Syndrome   CCM RN CM Interventions:  07/26/20 call completed with patient  . Inter-disciplinary care team collaboration (see longitudinal plan of care) . Evaluation of current treatment plan related to Chronic Pain Syndrome and patient's  adherence to plan as established by provider . Determined patient continues to have her Chronic pain managed by Dr. Cherly Anderson with [facility] with her next pump refill scheduled on 07/28/20 . Determined Dr. Cherly Anderson has scheduled a pump refill and replacement to attempt to decrease patient's pain secondary to pump displacement set for 09/08/20, scheduled for outpatient but may require inpatient  . Reviewed medications with patient and discussed patient's current intrathecal pain regimen is as follows:   o Iohexol (OMNIPAQUE) 180 mg/mL injection o 1 o 05/26/2020 o   o  lidocaine 1% (PF) 10 mg/mL injection o 1 o 05/26/2020 o   o  promethazine (PHENERGAN) injection 25 mg o 1 o 05/26/2020 o    . Discussed plans with patient for ongoing care management follow up and provided patient with direct contact information for care management team  Patient Self Care Activities:  . Continue to self administers medications as prescribed . Attend all scheduled provider appointments . Call pharmacy for medication refills . Call provider office for new concerns or questions   Please see past updates related to this goal by clicking on the "Past Updates" button in the selected goal         There are no care plans to display for this patient.  Plan:   Telephone follow up appointment with care management team member scheduled for: 09/11/20  Barb Merino, RN, BSN,  CCM Care Management Coordinator [place] [facility]  Direct Phone: [phone]

## 2020-07-31 NOTE — Patient Instructions (Signed)
Visit Information Goals      Patient Stated   .  "I need help at home" (pt-stated)      CARE PLAN ENTRY (see longitudinal plan of care for additional care plan information)  Current Barriers:  [name] Knowledge Deficits related to resources for custodial care needs, home delivered meals and transportation  . Chronic Disease Management support and education needs related to Type 2 diabetes Mellitus, Essential hypertension, Chronic pain syndrome, Hashimoto's disease, Lumbar radiculopathy . Lacks caregiver support . Transportation barriers . Lacks social connections . Unable to perform ADLs independently . Unable to perform IADLs independently  Nurse Case Manager Clinical Goal(s):  [name] Over the next 90 days, patient will work with the embedded BSW Daneen Schick to address needs related to caregiver assistance including custodial care, home delivered meals and transportation  CCM RN CM Interventions:  07/26/20 call completed with patient  . Inter-disciplinary care team collaboration (see longitudinal plan of care) . Evaluation of current treatment plan related to level of care concerns and patient's adherence to plan as established by provider . Determined patient is currently receiving in home services from [place] following recent COVID 19 infection  . Determined she continues to struggle with independent iADL's due to chronic pain syndrome and Impaired Physical Mobility  . Determined patient remains to be engaged with embedded [place] for resources and support of level of care concerns  . Discussed plans with patient for ongoing care management follow up and provided patient with direct contact information for care management team  Patient Self Care Activities:  . Continues to self administer medications as prescribed . Attends all scheduled provider appointments . Call pharmacy for medication refills . Call provider office for new concerns or questions . Continue to stay  engaged with embedded BSW for support and resources needed for level of care concerns   Please see past updates related to this goal by clicking on the "Past Updates" button in the selected goal      .  "I need my equipment replaced" (pt-stated)      [place] (see longitudinal plan of care for additional care plan information)  Current Barriers:  . Patient needs replacement equipment including a manual W/C and 4 pronged cane  . Chronic Disease Management support and education needs related to Type 2 diabetes Mellitus, Essential hypertension, Chronic pain syndrome, Hashimoto's disease, Lumbar radiculopathy  Nurse Case Manager Clinical Goal(s):  [name] Over the next 45 days, patient will work with the CCM team and PCP  to address needs related to care coordination with DME needs  CCM RN CM Interventions:  07/26/20 call completed with patient  . Inter-disciplinary care team collaboration (see longitudinal plan of care) . Evaluation of current treatment plan related to DME needs  and patient's adherence to plan as established by provider. . Determined patient received her replacement manual W/C however, she continues to need a replacement standard walker . Sent secure message to PCP provider [name] FNP requesting an Rx be sent to [place] for a standard walker  . Discussed plans with patient for ongoing care management follow up and provided patient with direct contact information for care management team  Patient Self Care Activities:  . Continue to self administer medications as prescribed . Attend all scheduled provider appointments . Call pharmacy for medication refills . Call provider office for new concerns or questions  Please see past updates related to this goal by clicking on the "Past Updates" button  in the selected goal      .  "to get my pain under better control" (pt-stated)      [place] (see longitudinal plan of care for additional care plan  information)  Current Barriers:  [name] Knowledge Deficits related to disease process and Self Health management of Chronic Pain  . Chronic Disease Management support and education needs related to Type 2 diabetes Mellitus, Essential hypertension, Chronic pain syndrome, Hashimoto's disease, Lumbar radiculopathy . Lacks caregiver support . Transportation barriers . Lacks social connections . Unable to perform ADLs independently . Unable to perform IADLs independently  Nurse Case Manager Clinical Goal(s):  [name] Over the next 180 days, patient will work with the CCM team and PCP to address needs related to disease education and support for improved Self Health management of Chronic Pain Syndrome   CCM RN CM Interventions:  07/26/20 call completed with patient  . Inter-disciplinary care team collaboration (see longitudinal plan of care) . Evaluation of current treatment plan related to Chronic Pain Syndrome and patient's adherence to plan as established by provider . Determined patient continues to have her Chronic pain managed by Dr. Cherly Anderson with [facility] with her next pump refill scheduled on 07/28/20 . Determined Dr. Cherly Anderson has scheduled a pump refill and replacement to attempt to decrease patient's pain secondary to pump displacement set for 09/08/20, scheduled for outpatient but may require inpatient  . Reviewed medications with patient and discussed patient's current intrathecal pain regimen is as follows:   o Iohexol (OMNIPAQUE) 180 mg/mL injection o 1 o 05/26/2020 o   o  lidocaine 1% (PF) 10 mg/mL injection o 1 o 05/26/2020 o   o  promethazine (PHENERGAN) injection 25 mg o 1 o 05/26/2020 o    . Discussed plans with patient for ongoing care management follow up and provided patient with direct contact information for care management team  Patient Self Care Activities:  . Continue to self administers medications as prescribed . Attend all scheduled provider appointments . Call pharmacy for  medication refills . Call provider office for new concerns or questions   Please see past updates related to this goal by clicking on the "Past Updates" button in the selected goal      .  Pharmacy Care Plan (pt-stated)      CARE PLAN ENTRY (see longitudinal plan of care for additional care plan information)  Current Barriers:  . Chronic Disease Management support, education, and care coordination needs related to Hypertension, Hyperlipidemia, and Diabetes   Hypertension BP Readings from Last 3 Encounters:  05/22/20 120/76  10/28/19 118/80  10/12/19 118/80   . Pharmacist Clinical Goal(s): o Over the next 90 days, patient will work with PharmD and providers to maintain BP goal <130/80 . Current regimen:  o Losartan 25 MG tablet take daily  o Hydrochlorothiazide 12.5 MG take daily o Atenolol 100 MG tablet take daily o Clonidine 0.1 MG  take 1 tablet three times a day as needed  . Interventions: o Maintain heart healthy, balanced diet o Patient receiving a BP cuff through her insurance program . Patient self care activities - Over the next 90 days, patient will: o Check BP 2-3 times per week, document, and provide at future appointments o Ensure daily salt intake < 2300 mg/day  Hyperlipidemia Lab Results  Component Value Date/Time   LDLCALC 48 04/29/2019 03:53 PM   . Pharmacist Clinical Goal(s): o Over the next 90 days, patient will work with PharmD and providers to  maintain LDL goal < 70 . Current regimen:  o Atorvastatin 10 MG -Take daily at bedtime . Interventions: o Using her BP cuff at least 2-3 times per week  . Patient self care activities - Over the next 90 days, patient will: o Continue to limit fried foods o Take medications daily as directed o Try to exercise for 30 minutes daily 5 times per week (150 minutes per week total)  Diabetes Lab Results  Component Value Date/Time   HGBA1C 6.7 (H) 10/12/2019 05:03 PM   HGBA1C 6.5 (H) 04/29/2019 03:53 PM    . Pharmacist Clinical Goal(s): o Over the next 90 days, patient will work with PharmD and providers to maintain A1c goal <7% . Current regimen:  o Metformin 500 MG take twice daily with a meal . Interventions: o Encouraged continuation of diabetic-friendly, balanced diet . Patient self care activities - Over the next 90 days, patient will: o Check blood sugar twice daily, document, and provide at future appointments o Contact provider with any episodes of hypoglycemia  Vitamin D Deficiency . Pharmacist Clinical Goal(s) o Over the next 90 days, patient will work with PharmD to increase Vitamin D levels in patient to greater than  . Current regimen:  o Vitamin D Ergocalciferol 1.25 MG -Take one capsule by mouth two times a week on Sunday and Thursday  . Interventions: o Collaborate with provider team to determine if patient should still be taking Vitamin D.  . Patient self care activities - Over the next 90 days, patient will: o Continue to go outside on sunny days   Medication management . Pharmacist Clinical Goal(s): o Over the next 90 days, patient will work with PharmD and providers to achieve optimal medication adherence . Current pharmacy: [facility]  . Interventions o Comprehensive medication review performed. o Continue current medication management strategy . Patient self care activities - Over the next 90 days, patient will: o Focus on medication adherence by using a medication reminder system o Take medications as prescribed o Report any questions or concerns to PharmD and/or provider(s)  Initial goal documentation   Orlando Penner, PharmD Clinical Pharmacist [facility] [phone]       There are no care plans to display for this patient.   The patient verbalized understanding of instructions, educational materials, and care plan provided today and declined offer to receive copy of patient instructions, educational materials,  and care plan.   Telephone follow up appointment with care management team member scheduled for:09/11/20  Lynne Marano, RN

## 2020-08-02 ENCOUNTER — Telehealth: Payer: Medicare Other

## 2020-08-02 ENCOUNTER — Telehealth: Payer: Self-pay

## 2020-08-02 NOTE — Telephone Encounter (Signed)
  Chronic Care Management   Outreach Note  08/02/2020 Name: Linda Mercado MRN: [id number] DOB: 1966/01/03  Referred by: [name], FNP Reason for referral : Care Coordination   A second unsuccessful telephone outreach was attempted today. The patient was referred to the case management team for assistance with care management and care coordination.   Follow Up Plan: Unfortunately, the patients voice mailbox was full which prohibited SW from leaving a HIPAA compliant voice message. The care management team will reach out to the patient again over the next 30 days.   Daneen Schick, BSW, CDP Social Worker, Certified Dementia Practitioner [place] [facility] [place] [facility] [phone]

## 2020-08-03 ENCOUNTER — Telehealth: Payer: Self-pay | Admitting: *Deleted

## 2020-08-03 NOTE — Chronic Care Management (AMB) (Signed)
  Care Management   Note  08/03/2020 Name: Linda Mercado MRN: [id number] DOB: November 17, 1965  Linda Mercado is a 55 y.o. year old female who is a primary care patient of [name], [place] and is actively engaged with the care management team. I reached out to [name] by phone today to assist with re-scheduling a follow up visit with the Pharmacist  Follow up plan: Unsuccessful telephone outreach attempt made.  The care management team will reach out to the patient again over the next 7 days. If patient returns call to provider office, please advise to call [place] at [phone].  [place] [facility]

## 2020-08-08 ENCOUNTER — Other Ambulatory Visit: Payer: Self-pay

## 2020-08-08 ENCOUNTER — Telehealth: Payer: Medicare Other

## 2020-08-08 DIAGNOSIS — N898 Other specified noninflammatory disorders of vagina: Secondary | ICD-10-CM

## 2020-08-08 MED ORDER — FLUCONAZOLE 100 MG PO TABS: 100.0000 mg | ORAL_TABLET | Freq: Every day | ORAL | 0 refills | Status: DC

## 2020-08-08 NOTE — Chronic Care Management (AMB) (Signed)
  Care Management   Note  08/08/2020 Name: KHRYSTYNA SCHWALM MRN: [id number] DOB: Oct 13, 1965  JAYCELYN ORRISON is a 55 y.o. year old female who is a primary care patient of [name], [place] and is actively engaged with the care management team. I reached out to [name] by phone today to assist with re-scheduling a follow up visit with the Pharmacist  Follow up plan: Telephone appointment with care management team member scheduled for:08/31/2020  [place] [facility]

## 2020-08-14 ENCOUNTER — Telehealth: Payer: Self-pay

## 2020-08-14 NOTE — Chronic Care Management (AMB) (Signed)
Chronic Care Management Pharmacy Assistant   Name: Linda Mercado  MRN: [id number] DOB: [birth year]  Reason for Encounter: Disease State - Hypertension Adherence Call.  Patient Questions:  1.  Have you seen any other providers since your last visit? Yes, 07/19/2020 [name], FNP - General - Cough  07/24/2020 - [facility].   2.  Any changes in your medicines or health? No     PCP : [name], FNP  Allergies:   Allergies  Allergen Reactions   Lisinopril Hives    Other reaction(s): anaphylaxis/angioedema   Latex Hives   Morphine And Related     Itching, skin get hot.    Adhesive [Tape] Rash    Medications: Outpatient Encounter Medications as of 08/14/2020  Medication Sig Note   ACCU-CHEK GUIDE test strip TEST TWICE DAILY AS DIRECTED    albuterol (VENTOLIN HFA) 108 (90 Base) MCG/ACT inhaler INHALE 2 PUFFS INTO THE LUNGS EVERY 6 HOURS AS NEEDED FOR WHEEZING OR SHORTNESS OF BREATH    AMITIZA 24 MCG capsule Take 24 mcg by mouth 2 (two) times daily.     atenolol (TENORMIN) 100 MG tablet Take 1 tablet (100 mg total) by mouth daily.    atorvastatin (LIPITOR) 10 MG tablet Take 1 tablet (10 mg total) by mouth at bedtime.    benzonatate (TESSALON PERLES) 100 MG capsule Take 1 capsule (100 mg total) by mouth every 6 (six) hours as needed.    Blood Glucose Monitoring Suppl (ACCU-CHEK GUIDE) w/Device KIT USE TO CHECK BLOOD SUGAR TWICE DAILY    calcium gluconate 500 MG tablet Take 1 tablet (500 mg total) by mouth 3 (three) times daily. (Patient not taking: Reported on 07/19/2020)    CAPLYTA 42 MG CAPS Take 42 mg by mouth at bedtime.     clonazePAM (KLONOPIN) 1 MG tablet Take 1 mg by mouth 3 (three) times daily.    Cyanocobalamin (B-12) 500 MCG SUBL One SL QD (Patient not taking: Reported on 07/19/2020)    DEXILANT 60 MG capsule Take 1 capsule by mouth daily. (Patient taking differently: Take 60 mg by mouth daily.)    diclofenac Sodium (VOLTAREN) 1 %  GEL APPLY 4 GRAMS TO THE AFFECTED AREA FOUR TIMES DAILY    docusate sodium (COLACE) 50 MG capsule Take 50 mg by mouth daily.     fluconazole (DIFLUCAN) 100 MG tablet Take 1 tablet (100 mg total) by mouth daily. Take 1 tablet by mouth now repeat in 5 days    fluticasone (FLOVENT HFA) 110 MCG/ACT inhaler Inhale 2 puffs into the lungs every 12 (twelve) hours.    hydrochlorothiazide (HYDRODIURIL) 12.5 MG tablet TAKE 1 TABLET(12.5 MG) BY MOUTH DAILY    HYDROmorphone (DILAUDID) 4 MG tablet Take 4 mg by mouth 4 (four) times daily as needed for severe pain.    hydrOXYzine (ATARAX/VISTARIL) 25 MG tablet Take 25 mg by mouth in the morning, at noon, in the evening, and at bedtime.    Lancets (ACCU-CHEK MULTICLIX) lancets Use as instructed    lidocaine (XYLOCAINE) 2 % solution Use as directed 15 mLs in the mouth or throat 3 (three) times a week.     Lidocaine 4 % PTCH Place 1 patch onto the skin in the morning and at bedtime.    LORazepam (ATIVAN) 2 MG tablet Take 2 mg by mouth 4 (four) times daily as needed for anxiety.     losartan (COZAAR) 25 MG tablet Take 1 tablet (25 mg total) by mouth daily.  losartan (COZAAR) 50 MG tablet Take 50 mg by mouth daily.    metFORMIN (GLUCOPHAGE) 500 MG tablet Take 1 tablet (500 mg total) by mouth 2 (two) times daily with a meal.    Multiple Vitamin (MULTIVITAMIN WITH MINERALS) TABS tablet Take 1 tablet by mouth daily.     NARCAN 4 MG/0.1ML LIQD nasal spray kit Place 1 spray into the nose as directed.    Norgestimate-Ethinyl Estradiol Triphasic (TRI-ESTARYLLA) 0.18/0.215/0.25 MG-35 MCG tablet Take 1 tablet by mouth daily.    ondansetron (ZOFRAN) 4 MG tablet Take 4 mg by mouth as needed for nausea.     PAIN MANAGEMENT INTRATHECAL, IT, PUMP 1 each by Intrathecal route continuous. Intrathecal (IT) medication: fentanyl, clonidine, bupivicaine  Fentanyl 270m/bupivicaine150mg/clonidine72mg  51.955mper day, 72m272mer day, 1.91m59m5.4ml/40mn    polyethylene  glycol powder (GLYCOLAX/MIRALAX) 17 GM/SCOOP powder MIX 17 GRAMS IN 8 OUNCES OF LIQUID AND DRINK DAILY FOR BOWELS (Patient not taking: Reported on 07/19/2020)    promethazine (PHENERGAN) 25 MG tablet Take 1 tablet (25 mg total) by mouth every 6 (six) hours as needed for nausea or vomiting.    QUEtiapine (SEROQUEL XR) 400 MG 24 hr tablet Take 400 mg by mouth 2 (two) times daily.    QUEtiapine (SEROQUEL) 300 MG tablet Take 300 mg by mouth at bedtime.    tiZANidine (ZANAFLEX) 4 MG tablet Take 4 mg by mouth every 4 (four) hours.    traZODone (DESYREL) 100 MG tablet 5 qhs prescribed by psychiatry (Patient taking differently: Take 100-200 mg by mouth at bedtime. 5 qhs prescribed by psychiatry)    triamcinolone cream (KENALOG) 0.1 % Apply 1 application topically daily as needed (for itching).    TRINTELLIX 20 MG TABS tablet Take 20 mg by mouth daily.    VRAYLAR 6 MG CAPS Take 6 mg by mouth daily. 06/21/2020: D/C by psychiatrist Lida Shelbie Hutchingpatient    No facility-administered encounter medications on file as of 08/14/2020.    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   Hypokalemia 03/18/2018   Intractable back pain 01/03/2018   HTN (hypertension) 01/03/2018   Type 2 diabetes mellitus without complication (HCC) [id number]/63/8756tamin D deficiency 07/11/2017   Postmenopausal 04/24/2017   Microalbuminuria 01/14/2017   Dyspareunia in female 12/10/2016   Endometriosis determined by laparoscopy 12/10/2016   History of exploratory laparotomy 12/10/2016    Pelvic pain 12/10/2016   Bipolar 1 disorder (HCC) [place]01/2018   Cyst of right ovary 09/19/2016   Severe episode of recurrent major depressive disorder, with psychotic features (HCC) [place]01/2018   Schizoaffective disorder, bipolar type (HCC) [place]01/2018   Chronic pain 03/05/2016   Non morbid obesity due to excess calories 03/05/2016   S/P lumbar fusion 03/05/2016   Reviewed chart prior to disease state call. Spoke with patient regarding BP  Recent Office Vitals: BP Readings from Last 3 Encounters:  05/22/20 120/76  10/28/19 118/80  10/12/19 118/80   Pulse Readings from Last 3 Encounters:  05/22/20 68  10/28/19 77  10/12/19 81    Wt Readings from  Last 3 Encounters:  05/22/20 169 lb 12.8 oz (77 kg)  10/28/19 157 lb 6.4 oz (71.4 kg)  10/12/19 151 lb 12.8 oz (68.9 kg)     Kidney Function Lab Results  Component Value Date/Time   CREATININE 0.80 10/06/2019 06:01 PM   CREATININE 0.90 10/06/2019 02:11 PM   GFRNONAA >60 10/06/2019 02:11 PM   GFRAA >60 10/06/2019 02:11 PM    BMP Latest Ref Rng & Units 10/06/2019 10/06/2019 09/17/2019  Glucose 70 - 99 mg/dL 94 161(H) 127(H)  BUN 6 - 20 mg/dL 11 11 11   Creatinine 0.44 - 1.00 mg/dL 0.80 0.90 0.83  BUN/Creat Ratio 9 - 23 - - -  Sodium 135 - 145 mmol/L 138 138 139  Potassium 3.5 - 5.1 mmol/L 3.5 3.5 3.5  Chloride 98 - 111 mmol/L 101 99 103  CO2 22 - 32 mmol/L - 26 24  Calcium 8.9 - 10.3 mg/dL - 9.5 9.2     Current antihypertensive regimen:  o HCTZ 12.5 mg one tablet a day o Losartan 25 Mg one tablet a day o Atenolol 100 one  aday   How often are you checking your Blood Pressure? Once or twice  a week    Current home BP readings: 136/74 Last Week- 08/11/2020   What recent interventions/DTPs have been made by any provider to improve Blood Pressure control since last CPP Visit: Patient states she takes her medications as directed.   Any recent hospitalizations or ED visits since last visit with CPP? No   What diet  changes have been made to improve Blood Pressure Control?  o Patient does no eat salt, states she eats baked or air fried foods.   What exercise is being done to improve your Blood Pressure Control?  o Patient states she can not walk, she is not active. Patient states she does move around house takesdog    Adherence Review: Is the patient currently on ACE/ARB medication? Yes  Does the patient have >5 day gap between last estimated fill dates? No       Follow-Up:  Pharmacist Review - Called patient for hypertension adherence call / and to reschedule initial visit. Patient need refills on her Losartan 50 mg / Hydrochlorothiazide / Zofran sent to [address]. Appointment scheduled for 08/29/2020 @ 9:00 AM for initial visit with Orlando Penner, CPP .  Sent team message to Pattricia Boss about refills and appointment .    Vallie Pearson,CPP Notified  Judithann Sheen, [facility] Clinical Pharmacist Assistant [phone]

## 2020-08-17 ENCOUNTER — Other Ambulatory Visit: Payer: Self-pay

## 2020-08-17 MED ORDER — HYDROCHLOROTHIAZIDE 12.5 MG PO TABS
ORAL_TABLET | ORAL | 0 refills | Status: DC
Start: 2020-08-17 — End: 2021-01-30

## 2020-08-17 MED ORDER — LOSARTAN POTASSIUM 50 MG PO TABS: 50.0000 mg | ORAL_TABLET | Freq: Every day | ORAL | 0 refills | Status: DC

## 2020-08-25 LAB — CMP14 + ANION GAP
ALT: 12 IU/L (ref 0–32)
AST: 14 IU/L (ref 0–40)
Albumin/Globulin Ratio: 1.5 (ref 1.2–2.2)
Albumin: 4.5 g/dL (ref 3.8–4.9)
Alkaline Phosphatase: 140 IU/L — ABNORMAL HIGH (ref 44–121)
Anion Gap: 17 mmol/L (ref 10.0–18.0)
BUN/Creatinine Ratio: 8 — ABNORMAL LOW (ref 9–23)
BUN: 9 mg/dL (ref 6–24)
Bilirubin Total: 0.3 mg/dL (ref 0.0–1.2)
CO2: 25 mmol/L (ref 20–29)
Calcium: 9.9 mg/dL (ref 8.7–10.2)
Chloride: 92 mmol/L — ABNORMAL LOW (ref 96–106)
Creatinine, Ser: 1.19 mg/dL — ABNORMAL HIGH (ref 0.57–1.00)
GFR calc Af Amer: 60 mL/min/{1.73_m2} (ref >59)
GFR calc non Af Amer: 52 mL/min/{1.73_m2} — ABNORMAL LOW (ref >59)
Globulin, Total: 3 g/dL (ref 1.5–4.5)
Glucose: 333 mg/dL — ABNORMAL HIGH (ref 65–99)
Potassium: 4.4 mmol/L (ref 3.5–5.2)
Sodium: 134 mmol/L (ref 134–144)
Total Protein: 7.5 g/dL (ref 6.0–8.5)

## 2020-08-25 LAB — TSH: TSH: 4.92 u[IU]/mL — ABNORMAL HIGH (ref 0.450–4.500)

## 2020-08-25 LAB — HEPATITIS C ANTIBODY: Hep C Virus Ab: <0.1 s/co ratio (ref 0.0–0.9)

## 2020-08-25 LAB — LIPID PANEL
Chol/HDL Ratio: 2.9 ratio (ref 0.0–4.4)
Cholesterol, Total: 120 mg/dL (ref 100–199)
HDL: 41 mg/dL (ref >39)
LDL Chol Calc (NIH): 50 mg/dL (ref 0–99)
Triglycerides: 175 mg/dL — ABNORMAL HIGH (ref 0–149)
VLDL Cholesterol Cal: 29 mg/dL (ref 5–40)

## 2020-08-25 LAB — HEMOGLOBIN A1C
Est. average glucose Bld gHb Est-mCnc: 306 mg/dL
Hgb A1c MFr Bld: 12.3 % — ABNORMAL HIGH (ref 4.8–5.6)

## 2020-08-25 LAB — VITAMIN D 25 HYDROXY (VIT D DEFICIENCY, FRACTURES): Vit D, 25-Hydroxy: 51.6 ng/mL (ref 30.0–100.0)

## 2020-08-28 ENCOUNTER — Telehealth: Payer: Self-pay

## 2020-08-28 NOTE — Chronic Care Management (AMB) (Signed)
08/28/20-Per request from [facility] through staff message to let the patient know refills of letting the patient know Losartan 50 mg, Hydrochlorithiazide 12.5 mg. Per Dr. [name], FNP patient can just take phenergan 25 mg; does not need the Zofran.   Unsuccessful telephone outreach to the patient. No answer, left a voicemail notifying the patient.  Orlando Penner, CPP Notified.  Raynelle Highland, [place] Pharmacist Assistant [phone]

## 2020-08-29 ENCOUNTER — Telehealth: Payer: Self-pay

## 2020-08-29 ENCOUNTER — Telehealth: Payer: Medicare Other

## 2020-08-29 NOTE — Telephone Encounter (Signed)
  Chronic Care Management   Outreach Note  08/29/2020 Name: Linda Mercado MRN: [id number] DOB: 1966/02/26  Referred by: [name], FNP Reason for referral : Care Coordination   Third unsuccessful telephone outreach was attempted today. The patient was referred to the case management team for assistance with care management and care coordination. The patient's primary care provider has been notified of our unsuccessful attempts to make or maintain contact with the patient. The care management team is pleased to engage with this patient at any time in the future should he/she be interested in assistance from the care management team.     Follow Up Plan: No SW follow up planned due to inability to establish patient contact. Patient will remain active with RN Care Manager.  Daneen Schick, BSW, CDP Social Worker, Certified Dementia Practitioner [place] [facility] [place] [facility] [phone]

## 2020-08-30 ENCOUNTER — Encounter: Payer: Self-pay | Admitting: Nurse Practitioner

## 2020-08-30 ENCOUNTER — Ambulatory Visit (INDEPENDENT_AMBULATORY_CARE_PROVIDER_SITE_OTHER): Payer: Medicare Other | Admitting: Nurse Practitioner

## 2020-08-30 ENCOUNTER — Other Ambulatory Visit: Payer: Self-pay

## 2020-08-30 ENCOUNTER — Telehealth: Payer: Self-pay

## 2020-08-30 VITALS — BP 124/80 | HR 76 | Temp 98.4°F | Ht 65.6 in | Wt 160.2 lb

## 2020-08-30 DIAGNOSIS — E119 Type 2 diabetes mellitus without complications: Secondary | ICD-10-CM

## 2020-08-30 MED ORDER — JANUMET 50-500 MG PO TABS: 1.0000 | ORAL_TABLET | Freq: Two times a day (BID) | ORAL | 2 refills | Status: DC

## 2020-08-30 MED ORDER — SITAGLIPTIN PHOSPHATE 25 MG PO TABS: 25.0000 mg | ORAL_TABLET | Freq: Every day | ORAL | 2 refills | Status: DC

## 2020-08-30 MED ORDER — TRESIBA FLEXTOUCH 100 UNIT/ML ~~LOC~~ SOPN: 10.0000 [IU] | PEN_INJECTOR | Freq: Every day | SUBCUTANEOUS | Status: DC

## 2020-08-30 NOTE — Patient Instructions (Addendum)
Diabetes Mellitus Basics  Diabetes mellitus, or diabetes, is a long-term (chronic) disease. It occurs when the body does not properly use sugar (glucose) that is released from food after you eat. Diabetes mellitus may be caused by one or both of these problems:  Your pancreas does not make enough of a hormone called insulin.  Your body does not react in a normal way to the insulin that it makes. Insulin lets glucose enter cells in your body. This gives you energy. If you have diabetes, glucose cannot get into cells. This causes high blood glucose (hyperglycemia). How to treat and manage diabetes You may need to take insulin or other diabetes medicines daily to keep your glucose in balance. If you are prescribed insulin, you will learn how to give yourself insulin by injection. You may need to adjust the amount of insulin you take based on the foods that you eat. You will need to check your blood glucose levels using a glucose monitor as told by your health care provider. The readings can help determine if you have low or high blood glucose. Generally, you should have these blood glucose levels:  Before meals (preprandial): 80-130 mg/dL (4.4-7.2 mmol/L).  After meals (postprandial): below 180 mg/dL (10 mmol/L).  Hemoglobin A1c (HbA1c) level: less than 7%. Your health care provider will set treatment goals for you. Keep all follow-up visits. This is important. Follow these instructions at home: Diabetes medicines Take your diabetes medicines every day as told by your health care provider. List your diabetes medicines here:  Name of medicine: ______________________________ ? Amount (dose): _______________ Time (a.m./p.m.): _______________ Notes: ___________________________________  Name of medicine: ______________________________ ? Amount (dose): _______________ Time (a.m./p.m.): _______________ Notes: ___________________________________  Name of medicine:  ______________________________ ? Amount (dose): _______________ Time (a.m./p.m.): _______________ Notes: ___________________________________ Insulin If you use insulin, list the types of insulin you use here:  Insulin type: ______________________________ ? Amount (dose): _______________ Time (a.m./p.m.): _______________Notes: ___________________________________  Insulin type: ______________________________ ? Amount (dose): _______________ Time (a.m./p.m.): _______________ Notes: ___________________________________  Insulin type: ______________________________ ? Amount (dose): _______________ Time (a.m./p.m.): _______________ Notes: ___________________________________  Insulin type: ______________________________ ? Amount (dose): _______________ Time (a.m./p.m.): _______________ Notes: ___________________________________  Insulin type: ______________________________ ? Amount (dose): _______________ Time (a.m./p.m.): _______________ Notes: ___________________________________ Managing blood glucose Check your blood glucose levels using a glucose monitor as told by your health care provider. Write down the times that you check your glucose levels here:  Time: _______________ Notes: ___________________________________  Time: _______________ Notes: ___________________________________  Time: _______________ Notes: ___________________________________  Time: _______________ Notes: ___________________________________  Time: _______________ Notes: ___________________________________  Time: _______________ Notes: ___________________________________   Low blood glucose Low blood glucose (hypoglycemia) is when glucose is at or below 70 mg/dL (3.9 mmol/L). Symptoms may include:  Feeling: ? Hungry. ? Sweaty and clammy. ? Irritable or easily upset. ? Dizzy. ? Sleepy.  Having: ? A fast heartbeat. ? A headache. ? A change in your vision. ? Numbness around the mouth, lips, or  tongue.  Having trouble with: ? Moving (coordination). ? Sleeping. Treating low blood glucose To treat low blood glucose, eat or drink something containing sugar right away. If you can think clearly and swallow safely, follow the 15:15 rule:  Take 15 grams of a fast-acting carb (carbohydrate), as told by your health care provider.  Some fast-acting carbs are: ? Glucose tablets: take 3-4 tablets. ? Hard candy: eat 3-5 pieces. ? Fruit juice: drink 4 oz (120 mL). ? Regular (not diet) soda: drink 4-6 oz (120-180 mL). ? Honey or sugar:   eat 1 Tbsp (15 mL).  Check your blood glucose levels 15 minutes after you take the carb.  If your glucose is still at or below 70 mg/dL (3.9 mmol/L), take 15 grams of a carb again.  If your glucose does not go above 70 mg/dL (3.9 mmol/L) after 3 tries, get help right away.  After your glucose goes back to normal, eat a meal or a snack within 1 hour. Treating very low blood glucose If your glucose is at or below 54 mg/dL (3 mmol/L), you have very low blood glucose (severe hypoglycemia). This is an emergency. Do not wait to see if the symptoms will go away. Get medical help right away. Call your local emergency services (911 in the [place] Do not drive yourself to the hospital. Questions to ask your health care provider  Should I talk with a diabetes educator?  What equipment will I need to care for myself at home?  What diabetes medicines do I need? When should I take them?  How often do I need to check my blood glucose levels?  What number can I call if I have questions?  When is my follow-up visit?  Where can I find a support group for people with diabetes? Where to find more information  American Diabetes Association: www.diabetes.org  Association of Diabetes Care and Education Specialists: www.diabeteseducator.org Contact a health care provider if:  Your blood glucose is at or above 240 mg/dL (13.3 mmol/L) for 2 days in a row.  You have  been sick or have had a fever for 2 days or more, and you are not getting better.  You have any of these problems for more than 6 hours: ? You cannot eat or drink. ? You feel nauseous. ? You vomit. ? You have diarrhea. Get help right away if:  Your blood glucose is lower than 54 mg/dL (3 mmol/L).  You get confused.  You have trouble thinking clearly.  You have trouble breathing. These symptoms may represent a serious problem that is an emergency. Do not wait to see if the symptoms will go away. Get medical help right away. Call your local emergency services (911 in the [place] Do not drive yourself to the hospital. Summary  Diabetes mellitus is a chronic disease that occurs when the body does not properly use sugar (glucose) that is released from food after you eat.  Take insulin and diabetes medicines as told.  Check your blood glucose every day, as often as told.  Keep all follow-up visits. This is important. This information is not intended to replace advice given to you by your health care provider. Make sure you discuss any questions you have with your health care provider. Document Revised: 11/09/2019 Document Reviewed: 11/09/2019 Elsevier Patient Education  [place].   Stop taking metformin and start taking janumet two times a day, when you run out let us know beforehand. If you have continuous vomiting hold the medication and call the office. I will have the diabetic educator to call

## 2020-08-30 NOTE — Progress Notes (Addendum)
I,Yamilka Roman [employer] as a Education administrator for [employer], FNP.,have documented all relevant documentation on the behalf of [name], FNP,as directed by  [name], FNP while in the presence of [name], [place]. This visit occurred during the SARS-CoV-2 public health emergency.  Safety protocols were in place, including screening questions prior to the visit, additional usage of staff PPE, and extensive cleaning of exam room while observing appropriate contact time as indicated for disinfecting solutions.  Subjective:     Patient ID: Linda Mercado , female    DOB: [birth year] , 55 y.o.   MRN: [id number]   Chief Complaint  Patient presents with  . Diabetes    HPI  Patient presents today for a dm f/u. She stated her blood sugars have been elevated for the last few days. She had went to Dr. Benson [place] last week to have her throat stretched, and her blood sugar was up to 300's. She has been under an increased amount of stress. She has been more nauseated after eating foods. She has been vomiting every other day.  She is checking her blood sugars at home down to 224. It usually is around 98-121.  She has been having elevated blood sugars over the last 1.5 months but did not notify the office. She reports she has been drinking more orange juice (craving).   Diabetes She presents for her follow-up diabetic visit. She has type 2 diabetes mellitus. There are no hypoglycemic associated symptoms. Pertinent negatives for hypoglycemia include no headaches or nervousness/anxiousness. There are no diabetic associated symptoms. Pertinent negatives for diabetes include no polydipsia, no polyphagia and no polyuria. There are no hypoglycemic complications. There are no diabetic complications. Risk factors for coronary artery disease include sedentary lifestyle and hypertension. Current diabetic treatment includes oral agent (monotherapy). She is following a generally unhealthy diet. When asked about meal planning,  she reported none. She has not had a previous visit with a dietitian. She rarely participates in exercise. An ACE inhibitor/angiotensin II receptor blocker is being taken. She does not see a podiatrist.Eye exam is not current.     Past Medical History:  Diagnosis Date  . Acid reflux   . Bradycardia   . Chronic back pain   . Diabetes mellitus without complication ([place])   . Hypertension   . Hypokalemia      Family History  Problem Relation Age of Onset  . Diabetes Father   . Hypertension Father   . Kidney disease Mother   . Hypertension Mother   . Bipolar disorder Sister   . Schizophrenia Sister   . Neuropathy Sister   . Hypertension Sister   . Bipolar disorder Brother   . Bipolar disorder Brother   . Schizophrenia Brother   . Bipolar disorder Daughter   . Post-traumatic stress disorder Daughter   . Asthma Daughter   . Depression Daughter   . Bipolar disorder Daughter   . Post-traumatic stress disorder Daughter   . Asthma Daughter   . Depression Daughter   . Asthma Daughter   . Depression Daughter   . Asthma Daughter   . Stroke Neg Hx   . Cancer Neg Hx   . CAD Neg Hx      Current Outpatient Medications:  .  ACCU-CHEK GUIDE test strip, TEST TWICE DAILY AS DIRECTED, Disp: 100 strip, Rfl: 2 .  albuterol (VENTOLIN HFA) 108 (90 Base) MCG/ACT inhaler, INHALE 2 PUFFS INTO THE LUNGS EVERY 6 HOURS AS NEEDED FOR WHEEZING OR SHORTNESS OF BREATH, Disp:  20.1 g, Rfl: 1 .  AMITIZA 24 MCG capsule, Take 24 mcg by mouth 2 (two) times daily. , Disp: , Rfl:  .  atenolol (TENORMIN) 100 MG tablet, Take 1 tablet (100 mg total) by mouth daily., Disp: 90 tablet, Rfl: 1 .  atorvastatin (LIPITOR) 10 MG tablet, Take 1 tablet (10 mg total) by mouth at bedtime., Disp: 90 tablet, Rfl: 1 .  benzonatate (TESSALON PERLES) 100 MG capsule, Take 1 capsule (100 mg total) by mouth every 6 (six) hours as needed., Disp: 30 capsule, Rfl: 1 .  Blood Glucose Monitoring Suppl (ACCU-CHEK GUIDE) w/Device KIT, USE  TO CHECK BLOOD SUGAR TWICE DAILY, Disp: 1 kit, Rfl: 3 .  CAPLYTA 42 MG CAPS, Take 42 mg by mouth at bedtime. , Disp: , Rfl:  .  clonazePAM (KLONOPIN) 1 MG tablet, Take 1 mg by mouth 3 (three) times daily., Disp: , Rfl:  .  DEXILANT 60 MG capsule, Take 1 capsule by mouth daily. (Patient taking differently: Take 60 mg by mouth daily.), Disp: 90 capsule, Rfl: 0 .  diclofenac Sodium (VOLTAREN) 1 % GEL, APPLY 4 GRAMS TO THE AFFECTED AREA FOUR TIMES DAILY, Disp: 400 g, Rfl: 1 .  docusate sodium (COLACE) 50 MG capsule, Take 50 mg by mouth daily. , Disp: , Rfl:  .  fluconazole (DIFLUCAN) 100 MG tablet, Take 1 tablet (100 mg total) by mouth daily. Take 1 tablet by mouth now repeat in 5 days (Patient not taking: Reported on 09/13/2020), Disp: 2 tablet, Rfl: 0 .  fluticasone (FLOVENT HFA) 110 MCG/ACT inhaler, Inhale 2 puffs into the lungs every 12 (twelve) hours., Disp: 3 each, Rfl: 1 .  hydrochlorothiazide (HYDRODIURIL) 12.5 MG tablet, TAKE 1 TABLET(12.5 MG) BY MOUTH DAILY, Disp: 90 tablet, Rfl: 0 .  HYDROmorphone (DILAUDID) 4 MG tablet, Take 4 mg by mouth 4 (four) times daily as needed for severe pain., Disp: , Rfl:  .  hydrOXYzine (ATARAX/VISTARIL) 25 MG tablet, Take 25 mg by mouth in the morning, at noon, in the evening, and at bedtime., Disp: , Rfl:  .  lidocaine (XYLOCAINE) 2 % solution, Use as directed 15 mLs in the mouth or throat 3 (three) times a week. , Disp: , Rfl:  .  Lidocaine 4 % PTCH, Place 1 patch onto the skin in the morning and at bedtime., Disp: 60 patch, Rfl: 5 .  LORazepam (ATIVAN) 2 MG tablet, Take 2 mg by mouth 4 (four) times daily as needed for anxiety. , Disp: , Rfl:  .  losartan (COZAAR) 50 MG tablet, Take 1 tablet (50 mg total) by mouth daily., Disp: 90 tablet, Rfl: 0 .  Multiple Vitamin (MULTIVITAMIN WITH MINERALS) TABS tablet, Take 1 tablet by mouth daily. , Disp: , Rfl:  .  NARCAN 4 MG/0.1ML LIQD nasal spray kit, Place 1 spray into the nose as directed., Disp: , Rfl: 0 .   Norgestimate-Ethinyl Estradiol Triphasic (TRI-ESTARYLLA) 0.18/0.215/0.25 MG-35 MCG tablet, Take 1 tablet by mouth daily., Disp: 90 tablet, Rfl: 1 .  ondansetron (ZOFRAN) 4 MG tablet, Take 4 mg by mouth as needed for nausea. , Disp: , Rfl:  .  PAIN MANAGEMENT INTRATHECAL, IT, PUMP, 1 each by Intrathecal route continuous. Intrathecal (IT) medication: fentanyl, clonidine, bupivicaine  Fentanyl 26m/bupivicaine150mg/clonidine24mg  51.963mper day, 24m21mer day, 1.39m89m5.4ml/224mn, Disp: , Rfl:  .  promethazine (PHENERGAN) 25 MG tablet, Take 1 tablet (25 mg total) by mouth every 6 (six) hours as needed for nausea or vomiting., Disp: 90 tablet, Rfl: 0 .  QUEtiapine (SEROQUEL XR) 400 MG 24 hr tablet, Take 400 mg by mouth 2 (two) times daily., Disp: , Rfl:  .  QUEtiapine (SEROQUEL) 300 MG tablet, Take 300 mg by mouth at bedtime., Disp: , Rfl:  .  tiZANidine (ZANAFLEX) 4 MG tablet, Take 4 mg by mouth every 4 (four) hours., Disp: , Rfl:  .  traZODone (DESYREL) 100 MG tablet, 5 qhs prescribed by psychiatry (Patient taking differently: Take 100-200 mg by mouth at bedtime. 5 qhs prescribed by psychiatry), Disp: 30 tablet, Rfl: 0 .  triamcinolone cream (KENALOG) 0.1 %, Apply 1 application topically daily as needed (for itching)., Disp: 30 g, Rfl: 2 .  TRINTELLIX 20 MG TABS tablet, Take 20 mg by mouth daily., Disp: , Rfl:  .  VRAYLAR 6 MG CAPS, Take 6 mg by mouth daily. (Patient not taking: Reported on 09/13/2020), Disp: , Rfl:  .  calcium gluconate 500 MG tablet, Take 1 tablet (500 mg total) by mouth 3 (three) times daily. (Patient not taking: Reported on 09/13/2020), Disp: 270 tablet, Rfl: 1 .  Continuous Blood Gluc Receiver (DEXCOM G6 RECEIVER) DEVI, Use to check blood sugars 4 times daily (Patient not taking: Reported on 09/13/2020), Disp: 1 each, Rfl: 2 .  Continuous Blood Gluc Sensor (DEXCOM G6 SENSOR) MISC, Use to check blood sugars 4 times daily (Patient not taking: Reported on 09/13/2020), Disp: 3 each, Rfl: 2 .   Continuous Blood Gluc Transmit (DEXCOM G6 TRANSMITTER) MISC, Use to check blood sugars 4 times [id number] (Patient not taking: Reported on 09/13/2020), Disp: 1 each, Rfl: 2 .  Cyanocobalamin (B-12) 500 MCG SUBL, One SL QD (Patient not taking: Reported on 09/13/2020), Disp: 90 tablet, Rfl: 1 .  insulin degludec (TRESIBA FLEXTOUCH) 100 UNIT/ML FlexTouch Pen, Inject 10 Units into the skin daily. Sample, Disp: 3 mL, Rfl: 0 .  Insulin Pen Needle (PEN NEEDLES) 32G X 4 MM MISC, Use as directed with tresiba, Disp: 50 each, Rfl: 2 .  Lancets (ACCU-CHEK MULTICLIX) lancets, Use to check blood sugars daily E11.69, Disp: 100 each, Rfl: 12 .  polyethylene glycol powder (GLYCOLAX/MIRALAX) 17 GM/SCOOP powder, MIX 17 GRAMS IN 8 OUNCES OF LIQUID AND DRINK DAILY FOR BOWELS (Patient not taking: No sig reported), Disp: 238 g, Rfl: 1 .  sitaGLIPtin (JANUVIA) 25 MG tablet, Take 1 tablet (25 mg total) by mouth daily. (Patient not taking: Reported on 09/13/2020), Disp: 30 tablet, Rfl: 2 .  sitaGLIPtin-metformin (JANUMET) 50-500 MG tablet, Take 1 tablet by mouth 2 (two) times daily with a meal., Disp: 60 tablet, Rfl: 2   Allergies  Allergen Reactions  . Lisinopril Hives    Other reaction(s): anaphylaxis/angioedema  . Latex Hives  . Morphine And Related     Itching, skin get hot.   . Adhesive [Tape] Rash     Review of Systems  Constitutional: Negative.   HENT: Negative.   Eyes: Negative.   Respiratory: Negative.   Cardiovascular: Negative.   Gastrointestinal: Negative.   Endocrine: Negative for polydipsia, polyphagia and polyuria.  Genitourinary: Negative.   Musculoskeletal: Negative.   Skin: Negative.   Neurological: Negative.  Negative for headaches.  Hematological: Negative.   Psychiatric/Behavioral: Negative.  The patient is not nervous/anxious.      Today's Vitals   08/30/20 1544  BP: 124/80  Pulse: 76  Temp: 98.4 F (36.9 C)  TempSrc: Oral  Weight: 160 lb 3.2 oz (72.7 kg)  Height: 5' 5.6" (1.666 m)   PainSc: 8    Body mass index is 26.17 kg/m.  Objective:  Physical Exam Constitutional:      Appearance: Normal appearance. She is normal weight.  Cardiovascular:     Rate and Rhythm: Normal rate and regular rhythm.     Pulses: Normal pulses.     Heart sounds: Normal heart sounds.  Pulmonary:     Effort: Pulmonary effort is normal.     Breath sounds: Normal breath sounds.  Abdominal:     General: Abdomen is flat. Bowel sounds are normal.     Palpations: Abdomen is soft.  Musculoskeletal:        General: Normal range of motion.     Cervical back: Normal range of motion and neck supple.  Skin:    General: Skin is warm and dry.     Capillary Refill: Capillary refill takes less than 2 seconds.  Neurological:     General: No focal deficit present.     Mental Status: She is alert and oriented to person, place, and time.  Psychiatric:        Mood and Affect: Mood normal.        Behavior: Behavior normal.        Thought Content: Thought content normal.        Judgment: Judgment normal.         Assessment And Plan:     1. Type 2 diabetes mellitus without complication, without long-term current use of insulin ([facility])  She is to be taking Januvia 25 mg daily and is currently on 10 units of Tresiba as well as we continue to try and get her blood sugars down. Will recheck her HgbA1c in April.   She is checking her blood sugar 4 times a day and administering her Tyler Aas once a day.    At this time she has not been checking her blood sugar with a glucometer.   If her blood sugars continue to be elevated she will need as needed insulin 3 additional times a day.   Her blood sugars are fluctuating and require frequent adjustments - sitaGLIPtin (JANUVIA) 25 MG tablet; Take 1 tablet (25 mg total) by mouth daily. (Patient not taking: Reported on 09/13/2020)  Dispense: 30 tablet; Refill: 2     Patient was given opportunity to ask questions. Patient verbalized understanding of the plan  and was able to repeat key elements of the plan. All questions were answered to their satisfaction.  [name], FNP   I, [name], FNP, have reviewed all documentation for this visit. The documentation on 09/24/20 for the exam, diagnosis, procedures, and orders are all accurate and complete.  THE PATIENT IS ENCOURAGED TO PRACTICE SOCIAL DISTANCING DUE TO THE COVID-19 PANDEMIC.

## 2020-08-30 NOTE — Progress Notes (Signed)
08/30/20-Notified the patient of upcoming CCM Call appointment tomorrow 08/31/20 at 2:30 pm with Orlando Penner, CPP. Made the patient aware to have medications and supplements near during phone visit. The patient confirmed and verbalized understanding.  Orlando Penner, CPP Notified.  Raynelle Highland, [place] Pharmacist Assistant [phone]

## 2020-08-31 ENCOUNTER — Ambulatory Visit (INDEPENDENT_AMBULATORY_CARE_PROVIDER_SITE_OTHER): Payer: Medicare Other

## 2020-08-31 DIAGNOSIS — I10 Essential (primary) hypertension: Secondary | ICD-10-CM | POA: Diagnosis not present

## 2020-08-31 DIAGNOSIS — E119 Type 2 diabetes mellitus without complications: Secondary | ICD-10-CM

## 2020-08-31 DIAGNOSIS — R52 Pain, unspecified: Secondary | ICD-10-CM

## 2020-08-31 NOTE — Progress Notes (Signed)
Chronic Care Management Pharmacy Note  09/14/2020 Name:  Linda Mercado MRN:  [id number] DOB:  10/31/1965  Subjective: Linda Mercado is an 55 y.o. year old female who is a primary patient of [name], [facility].  The CCM team was consulted for assistance with disease management and care coordination needs.    Engaged with patient by telephone for follow up visit in response to provider referral for pharmacy case management and/or care coordination services.   Consent to Services:  The patient was given information about Chronic Care Management services, agreed to services, and gave verbal consent prior to initiation of services.  Please see initial visit note for detailed documentation.   Patient Care Team: [name], FNP as PCP - General (General Practice) Lynne Zill, RN as Case Manager Mayford Knife, [facility] (Pharmacist)  Recent office visits: 08/30/2020 OV   Recent consult visits: 07/28/2020 [facility] visits:   Objective:  Lab Results  Component Value Date   CREATININE 1.19 (H) 08/24/2020   BUN 9 08/24/2020   GFRNONAA 52 (L) 08/24/2020   GFRAA 60 08/24/2020   NA 134 08/24/2020   K 4.4 08/24/2020   CALCIUM 9.9 08/24/2020   CO2 25 08/24/2020    Lab Results  Component Value Date/Time   HGBA1C 12.3 (H) 08/24/2020 03:51 PM   HGBA1C 6.7 (H) 10/12/2019 05:03 PM    Last diabetic Eye exam:  Lab Results  Component Value Date/Time   HMDIABEYEEXA No Retinopathy 09/08/2020 12:00 AM    Last diabetic Foot exam: No results found for: HMDIABFOOTEX   Lab Results  Component Value Date   CHOL 120 08/24/2020   HDL 41 08/24/2020   LDLCALC 50 08/24/2020   TRIG 175 (H) 08/24/2020   CHOLHDL 2.9 08/24/2020    Hepatic Function Latest Ref Rng & Units 08/24/2020 09/17/2018 08/13/2018  Total Protein 6.0 - 8.5 g/dL 7.5 7.7 8.2(H)  Albumin 3.8 - 4.9 g/dL 4.5 4.6 4.3  AST 0 - 40 IU/L 14 9 51(H)  ALT 0 - 32 IU/L 12 9 24   Alk Phosphatase 44 - 121 IU/L 140(H) 110 79   Total Bilirubin 0.0 - 1.2 mg/dL 0.3 <0.2 1.1    Lab Results  Component Value Date/Time   TSH 4.920 (H) 08/24/2020 03:51 PM   TSH 1.660 02/18/2019 12:47 PM   FREET4 1.16 02/23/2019 03:58 PM    CBC Latest Ref Rng & Units 10/06/2019 10/06/2019 10/06/2019  WBC 4.0 - 10.5 K/uL - 7.2 6.3  Hemoglobin 12.0 - 15.0 g/dL 13.6 13.0 13.5  Hematocrit 36.0 - 46.0 % 40.0 39.5 42.0  Platelets 150 - 400 K/uL - 332 367    Lab Results  Component Value Date/Time   VD25OH 51.6 08/24/2020 03:51 PM   VD25OH 142.0 (H) 10/12/2019 05:03 PM    Clinical ASCVD: No  The ASCVD Risk score Mikey Bussing DC Jr., et al., 2013) failed to calculate for the following reasons:   The valid total cholesterol range is 130 to 320 mg/dL    Depression screen [facility] 2/9 09/13/2020 03/25/2019 02/18/2019  Decreased Interest 2 2 3   Down, Depressed, Hopeless 2 2 3   PHQ - 2 Score 4 4 6   Altered sleeping 3 3 2   Tired, decreased energy 0 2 3  Change in appetite 3 2 2   Feeling bad or failure about yourself  0 2 2  Trouble concentrating 2 3 2   Moving slowly or fidgety/restless 0 2 2  Suicidal thoughts 0 0 -  PHQ-9 Score  12 18 19   Difficult doing work/chores Somewhat difficult Very difficult -       Social History   Tobacco Use  Smoking Status Never Smoker  Smokeless Tobacco Never Used   BP Readings from Last 3 Encounters:  09/13/20 (!) 142/80  08/30/20 124/80  05/22/20 120/76   Pulse Readings from Last 3 Encounters:  09/13/20 80  08/30/20 76  05/22/20 68   Wt Readings from Last 3 Encounters:  09/13/20 164 lb (74.4 kg)  08/30/20 160 lb 3.2 oz (72.7 kg)  05/22/20 169 lb 12.8 oz (77 kg)    Assessment/Interventions: Review of patient past medical history, allergies, medications, health status, including review of consultants reports, laboratory and other test data, was performed as part of comprehensive evaluation and provision of chronic care management services.   SDOH:  (Social Determinants of Health) assessments and  interventions performed: No   CCM Care Plan  Allergies  Allergen Reactions  . Lisinopril Hives    Other reaction(s): anaphylaxis/angioedema  . Latex Hives  . Morphine And Related     Itching, skin get hot.   . Adhesive [Tape] Rash    Medications Reviewed Today    Reviewed by Kellie Simmering, LPN (Licensed Practical Nurse) on 09/13/20 at [phone]  Med List Status: <None>  Medication Order Taking? Sig Documenting Provider Last Dose Status Informant  ACCU-CHEK GUIDE test strip [id number] Yes TEST TWICE DAILY AS DIRECTED [name], FNP Taking Active   albuterol (VENTOLIN HFA) 108 (90 Base) MCG/ACT inhaler [id number] Yes INHALE 2 PUFFS INTO THE LUNGS EVERY 6 HOURS AS NEEDED FOR WHEEZING OR SHORTNESS OF Placido Sou, FNP Taking Active   AMITIZA 24 MCG capsule [id number] Yes Take 24 mcg by mouth 2 (two) times daily.  [provider] Taking Active Multiple Informants  atenolol (TENORMIN) 100 MG tablet [id number] Yes Take 1 tablet (100 mg total) by mouth daily. [name], FNP Taking Active   atorvastatin (LIPITOR) 10 MG tablet [id number] Yes Take 1 tablet (10 mg total) by mouth at bedtime. [name], FNP Taking Active   benzonatate (TESSALON PERLES) 100 MG capsule [id number] Yes Take 1 capsule (100 mg total) by mouth every 6 (six) hours as needed. [name], FNP Taking Active   Blood Glucose Monitoring Suppl (ACCU-CHEK GUIDE) w/Device KIT [id number] Yes USE TO CHECK BLOOD SUGAR TWICE DAILY [name], FNP Taking Active   calcium gluconate 500 MG tablet [id number] No Take 1 tablet (500 mg total) by mouth 3 (three) times daily.  Patient not taking: Reported on 09/13/2020   [name], FNP Not Taking Active   CAPLYTA 42 MG CAPS [id number] Yes Take 42 mg by mouth at bedtime.  [provider] Taking Active Multiple Informants  clonazePAM (KLONOPIN) 1 MG tablet [id number] Yes Take 1 mg by mouth 3 (three) times daily. [provider] Taking Active Multiple  Informants  Continuous Blood Gluc Receiver (DEXCOM G6 RECEIVER) DEVI [id number] No Use to check blood sugars 4 times daily  Patient not taking: Reported on 09/13/2020   [name], FNP Not Taking Active   Continuous Blood Gluc Sensor (DEXCOM G6 SENSOR) MISC [id number] No Use to check blood sugars 4 times daily  Patient not taking: Reported on 09/13/2020   [name], FNP Not Taking Active   Continuous Blood Gluc Transmit (DEXCOM G6 TRANSMITTER) MISC [id number] No Use to check blood sugars 4 times [id number]  Patient not taking: Reported on 09/13/2020   [name], FNP Not Taking Active   Cyanocobalamin (B-12) 500  MCG SUBL [id number] No One SL QD  Patient not taking: Reported on 09/13/2020   [name], FNP Not Taking Active   DEXILANT 60 MG capsule [id number] Yes Take 1 capsule by mouth daily.  Patient taking differently: Take 60 mg by mouth daily.   [name], FNP Taking Active   diclofenac Sodium (VOLTAREN) 1 % GEL [id number] Yes APPLY 4 GRAMS TO THE AFFECTED AREA FOUR TIMES DAILY [name], FNP Taking Active   docusate sodium (COLACE) 50 MG capsule [id number] Yes Take 50 mg by mouth daily.  [provider] Taking Active Multiple Informants  fluconazole (DIFLUCAN) 100 MG tablet [id number] No Take 1 tablet (100 mg total) by mouth daily. Take 1 tablet by mouth now repeat in 5 days  Patient not taking: Reported on 09/13/2020   [name], FNP Not Taking Active   fluticasone [facility] HFA) 110 MCG/ACT inhaler [id number] Yes Inhale 2 puffs into the lungs every 12 (twelve) hours. [name], FNP Taking Active   hydrochlorothiazide (HYDRODIURIL) 12.5 MG tablet [id number] Yes TAKE 1 TABLET(12.5 MG) BY MOUTH DAILY [name], FNP Taking Active   HYDROmorphone (DILAUDID) 4 MG tablet [id number] Yes Take 4 mg by mouth 4 (four) times daily as needed for severe pain. [provider] Taking Active Multiple Informants  hydrOXYzine (ATARAX/VISTARIL) 25 MG tablet [id number] Yes Take  25 mg by mouth in the morning, at noon, in the evening, and at bedtime. [provider] Taking Active Multiple Informants  insulin degludec (TRESIBA FLEXTOUCH) 100 UNIT/ML FlexTouch Pen [id number] Yes Inject 10 Units into the skin daily. Sample [name], FNP Taking Active   Insulin Pen Needle (PEN NEEDLES) 32G X 4 MM MISC [id number] Yes Use as directed with Rudean Curt, FNP Taking Active   Lancets (ACCU-CHEK MULTICLIX) lancets [id number] Yes Use to check blood sugars daily E11.69 [name], FNP Taking Active   lidocaine (XYLOCAINE) 2 % solution [id number] Yes Use as directed 15 mLs in the mouth or throat 3 (three) times a week.  [provider] Taking Active Multiple Informants  Lidocaine 4 % PTCH [id number] Yes Place 1 patch onto the skin in the morning and at bedtime. [name], FNP Taking Active   LORazepam (ATIVAN) 2 MG tablet [id number] Yes Take 2 mg by mouth 4 (four) times daily as needed for anxiety.  [provider] Taking Active   losartan (COZAAR) 50 MG tablet [id number] Yes Take 1 tablet (50 mg total) by mouth daily. [name], FNP Taking Active   Multiple Vitamin (MULTIVITAMIN WITH MINERALS) TABS tablet [id number] Yes Take 1 tablet by mouth daily.  [provider] Taking Active Multiple Informants  NARCAN 4 MG/0.1ML LIQD nasal spray kit [id number] Yes Place 1 spray into the nose as directed. [provider] Taking Active Multiple Informants  Norgestimate-Ethinyl Estradiol Triphasic (TRI-ESTARYLLA) 0.18/0.215/0.25 MG-35 MCG tablet [id number] Yes Take 1 tablet by mouth daily. [name], FNP Taking Active   ondansetron [facility]) 4 MG tablet [id number] Yes Take 4 mg by mouth as needed for nausea.  [provider] Taking Active Multiple Informants  PAIN MANAGEMENT INTRATHECAL, IT, PUMP [id number] Yes 1 each by Intrathecal route continuous. Intrathecal (IT) medication: fentanyl, clonidine, bupivicaine  Fentanyl  281m/bupivicaine150mg/clonidine11mg  51.99mper day, 11m57mer day, 1.47m311m5.4ml/16mn [provider] Taking Active Multiple Informants  polyethylene glycol powder (GLYCOLAX/MIRALAX) 17 GM/SCOOP powder [id number] 17 GRAMS IN 8 OUNCES OF LIQUID AND DRINK DAILY FOR BOWELS  Patient not taking: No sig reported   Rodriguez-Southworth, SylviSunday SpillersC  Not Taking Active   promethazine (PHENERGAN) 25 MG tablet [id number] Yes Take 1 tablet (25 mg total) by mouth every 6 (six) hours as needed for nausea or vomiting. [name], FNP Taking Active   QUEtiapine (SEROQUEL XR) 400 MG 24 hr tablet [id number] Yes Take 400 mg by mouth 2 (two) times daily. [provider] Taking Active Multiple Informants           Med Note Duffy Bruce, Legrand Como   Wed Mar 18, 2018  7:28 PM)    QUEtiapine (SEROQUEL) 300 MG tablet [id number] Yes Take 300 mg by mouth at bedtime. [provider] Taking Active Multiple Informants  sitaGLIPtin (JANUVIA) 25 MG tablet [id number] No Take 1 tablet (25 mg total) by mouth daily.  Patient not taking: Reported on 09/13/2020   [name], FNP Not Taking Active   sitaGLIPtin-metformin (JANUMET) 50-500 MG tablet [id number] No Take 1 tablet by mouth 2 (two) times daily with a meal.  Patient not taking: Reported on 09/13/2020   [name], FNP Not Taking Active   tiZANidine (ZANAFLEX) 4 MG tablet [id number] Yes Take 4 mg by mouth every 4 (four) hours. [provider] Taking Active Multiple Informants  traZODone (DESYREL) 100 MG tablet [id number] Yes 5 qhs prescribed by psychiatry  Patient taking differently: Take 100-200 mg by mouth at bedtime. 5 qhs prescribed by psychiatry   Rodriguez-Southworth, [name], PA-C Taking Active Multiple Informants  triamcinolone cream (KENALOG) 0.1 % [id number] Yes Apply 1 application topically daily as needed (for itching). Rodriguez-Southworth, [name], PA-C Taking Active Multiple Informants  TRINTELLIX 20 MG TABS tablet [id number] Yes  Take 20 mg by mouth daily. [provider] Taking Active   VRAYLAR 6 MG CAPS [id number] No Take 6 mg by mouth daily.  Patient not taking: Reported on 09/13/2020   [provider] Not Taking Active Multiple Informants           Med Note Jeoffrey Massed Jun 21, 2020  1:55 PM) D/C by psychiatrist Shelbie Hutching per patient           Patient Active Problem List   Diagnosis Date Noted  . Pain in right knee 11/17/2019  . Pain in left knee 05/06/2019  . Hashimoto's disease 04/05/2019  . Nausea 11/23/2018  . Frequent falls 09/17/2018  . Skin lesion 09/17/2018  . GERD (gastroesophageal reflux disease)   . Chronic midline low back pain with sciatica 05/27/2018  . Intractable pain 03/20/2018  . Bradycardia   . Chronic pain syndrome 03/18/2018  . 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 Hypokalemia 03/18/2018  . Intractable back pain 01/03/2018  . HTN (hypertension) 01/03/2018  . Type 2 diabetes mellitus without complication ([place]) 76/81/1572  . Vitamin D deficiency 07/11/2017  . Postmenopausal 04/24/2017  . Microalbuminuria 01/14/2017  . Dyspareunia in female 12/10/2016  . Endometriosis determined by laparoscopy 12/10/2016  . History of exploratory laparotomy 12/10/2016  . Pelvic pain 12/10/2016  . Bipolar 1 disorder ([place]) 09/19/2016  . Cyst of right ovary 09/19/2016  . Severe episode of recurrent major depressive disorder, with psychotic features ([place]) 09/19/2016  . Schizoaffective disorder, bipolar type ([place]) 09/19/2016  . Chronic pain 03/05/2016  . Non morbid obesity due to excess calories 03/05/2016  . S/P lumbar fusion 03/05/2016    Immunization History  Administered Date(s) Administered  . Influenza Inj Mdck Quad With Preservative 05/11/2018  . Influenza,inj,Quad PF,6+ Mos 09/19/2016, 04/24/2017  . PFIZER(Purple Top)SARS-COV-2 Vaccination 06/05/2020, 06/26/2020    Conditions to be addressed/monitored:  Hypertension, Hyperlipidemia, Diabetes and  Chronic Pain  Care Plan : [place]  Updates made by Mayford Knife, RPH since 09/14/2020 12:00 AM    Problem: HTN, HLD, DM   Priority: High    Long-Range Goal: Disease Management   This Visit's Progress: On track  Priority: High  Note:   Current Barriers:  . Unable to independently afford treatment regimen . Unable to achieve control of glucose levels.    Pharmacist Clinical Goal(s):  [name] Over the next 30 days, patient will achieve adherence to monitoring guidelines and medication adherence to achieve therapeutic efficacy through collaboration with PharmD and provider.   Interventions: . 1:1 collaboration with [name], FNP regarding development and update of comprehensive plan of care as evidenced by provider attestation and co-signature . Inter-disciplinary care team collaboration (see longitudinal plan of care) . Comprehensive medication review performed; medication list updated in electronic medical record  Hypertension (BP goal <130/80) -controlled -Current treatment: . Losartan 50 mg tablet daily  . Atenolol 100 mg take daily   . Hydrochlorothiazide 12.5 mg tablet daily  -Medications previously tried: losartan 25 mg,  -Current home readings: 126/80, 121/70 -Current dietary habits: patient is avoiding fried and fatty foods and she is using an air fryer, she also uses an indoor grill for her meats  -Current exercise habits: none at this time  -Denies hypotensive/hypertensive symptoms -Educated on BP goals and benefits of medications for prevention of heart attack, stroke and kidney damage; Daily salt intake goal < 2300 mg; Exercise goal of 150 minutes per week; Importance of home blood pressure monitoring; Proper BP monitoring technique; -Counseled to monitor BP at home once per week, document, and provide log at future appointments -Recommended to continue current medication  Hyperlipidemia: (LDL goal < 70) -controlled -Current  treatment: . Atorvastatin 10 mg tablet take daily  -Current exercise habits: she parks far from where she is going and uses her cane or walker, and she also does a lot around the house for the twins.  -Educated on Cholesterol goals;  Benefits of statin for ASCVD risk reduction; Importance of limiting foods high in cholesterol; Exercise goal of 150 minutes per week; -Counseled on diet and exercise extensively Recommended to continue current medication  Diabetes (A1c goal <7%) -uncontrolled -Current medications: . Tyler Aas - Inject 10 units once per day  . Janumet 50 - 500 mg - taking 1 tablet by mouth twice daily  -Medications previously tried:  -Current home glucose readings -Patient was supposed to have esophagus stretched but her blood sugar was 345, she went to the office and her blood work showing her A1c was extremely high - Now checking her BS twice per day  . fasting glucose:  345, 260, 152   . post prandial glucose: she will check before dinner  -Reports hypoglycemic/hyperglycemic symptoms  -Was nauseous  -Current meal patterns: patient reports that she has been having a lot of sugar cravings  . Has been eating a lot of sugary stuff she did not discuss in detail  . drinks: 2-3 gallons of orange juice in 4 days to a week. She recently stopped drinking juice, now she is is drinking water  -Current exercise: none at this time  -Educated onA1c and blood sugar goals; Complications of diabetes including kidney damage, retinal damage, and cardiovascular disease; Benefits of weight loss; Proper insulin injection technique; Benefits of routine self-monitoring of blood sugar; -Counseled to check feet daily and get yearly eye exams -Counseled on diet and exercise  extensively Recommended to continue current medication Patients current dose of Janumet should be increased to Janumet 50-1000 mg take 1 tablet by mouth twice per day   Patient Goals/Self-Care Activities . Over the next 30  days, patient will:  - take medications as prescribed  Follow Up Plan: Telephone follow up appointment with care management team member scheduled for: 09/27/2020      Medication Assistance: Patient is paying for her medicaid drugs and she is not able to afford them. CPA will contact [facility] and see why the charge has gone up. Her medications were covered but the last time she went to the pharmacy it was over 20 dollars and she was not able to pick up all of her medication.   Patient's preferred pharmacy is:  [id number] - [address], [address] [place] [address] Phone: [phone] Fax: [phone]  Uses pill box? No - patient uses vials and does not forget to take her medication.  Pt endorses 100% compliance  We discussed: Benefits of medication synchronization, packaging and delivery as well as enhanced pharmacist oversight with [facility]. Patient decided to: Continue current medication management strategy  Care Plan and Follow Up Patient Decision:  Patient agrees to Care Plan and Follow-up.  Plan: Telephone follow up appointment with care management team member scheduled for:  one month., The patient has been provided with contact information for the care management team and has been advised to call with any health related questions or concerns. , The care management team will reach out to the patient again over the next 30 days. and Next PCP appointment scheduled for:   Orlando Penner, PharmD Clinical Pharmacist [facility] [phone]

## 2020-09-04 ENCOUNTER — Other Ambulatory Visit: Payer: Self-pay

## 2020-09-04 IMAGING — CT CT CERVICAL SPINE W/O CM
3 series · 12 of 33 positions shown, 14 images · IV contrast (OMNI 350)
Comparison: CT angiography neck completed on the same date.

CLINICAL DATA: Hypertension.

EXAM:
CT CERVICAL SPINE WITHOUT CONTRAST
TECHNIQUE: Multidetector CT imaging of the cervical spine was performed without
intravenous contrast. Multiplanar CT image reconstructions were also
generated.

[Series 14: c-spine_ax st · axial · 0.27mm/px · z∈[-227,-95]mm · 4 of 97 slices shown, 5 images]
[im 15/97  soft-tissue]
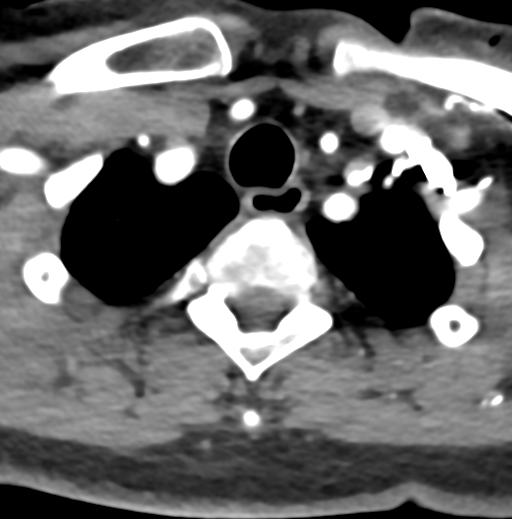
[im 15/97  bone]
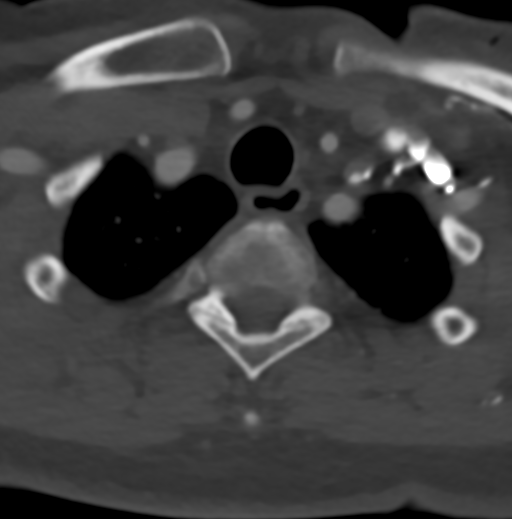
[im 37/97  bone]
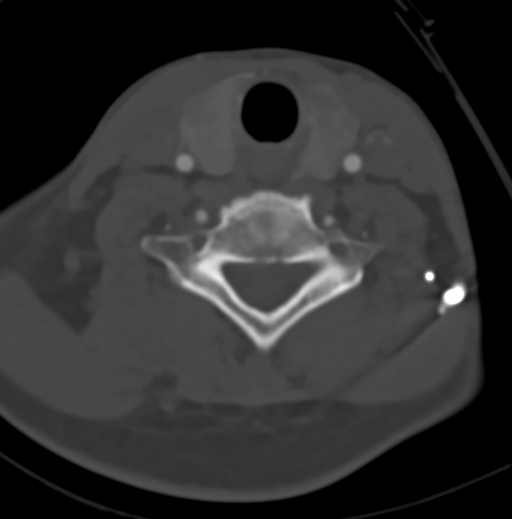
[im 60/97  bone]
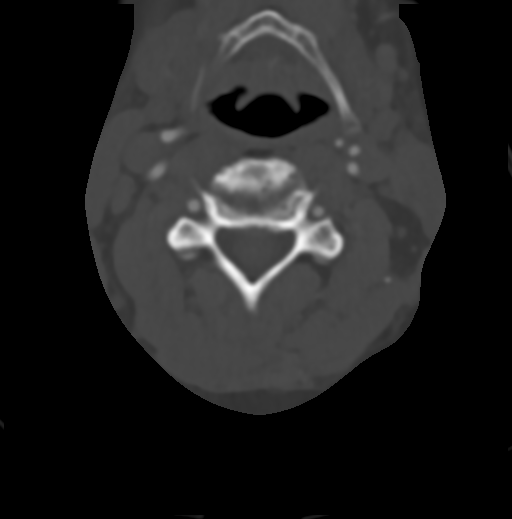
[im 82/97  bone]
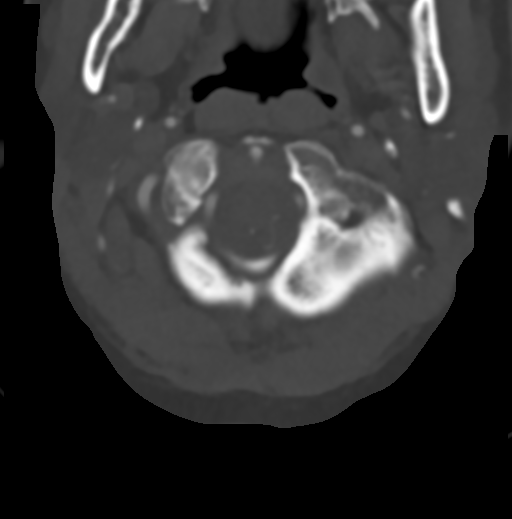

[Series 15: c-spine_coronal · coronal · 0.27mm/px · 3 of 62 slices shown]
[im 13/62  bone]
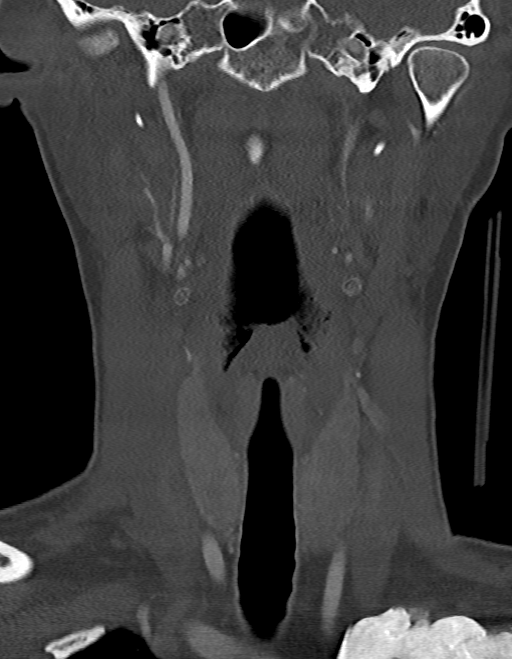
[im 25/62  bone]
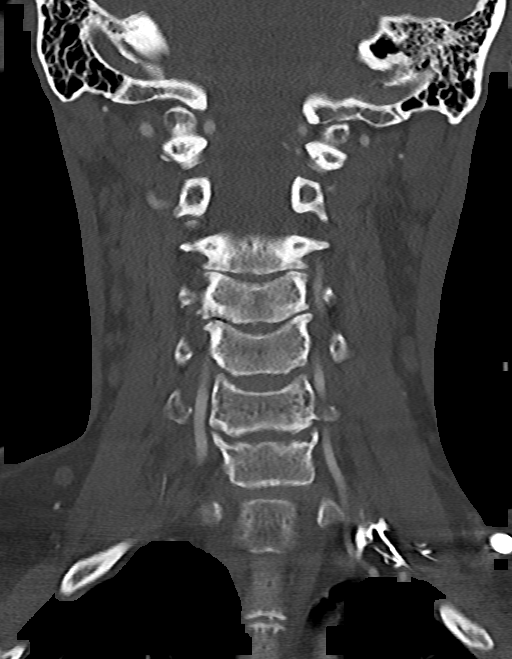
[im 37/62  bone]
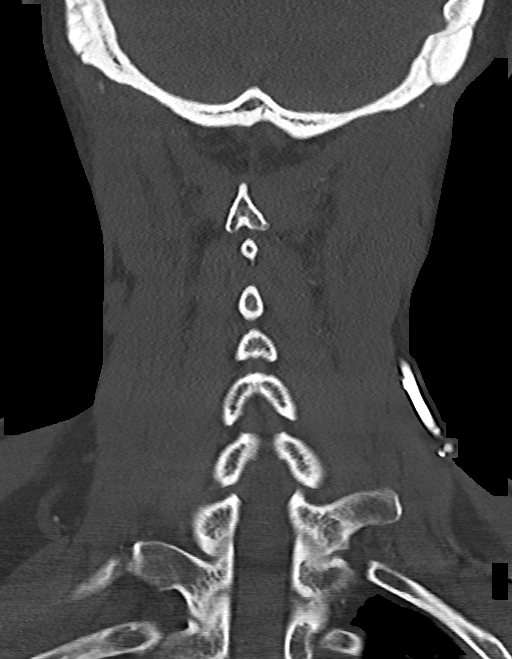

[Series 16: c-spine_sagittal · sagittal · 0.24mm/px · 5 of 70 slices shown, 6 images]
[im 24/70  bone]
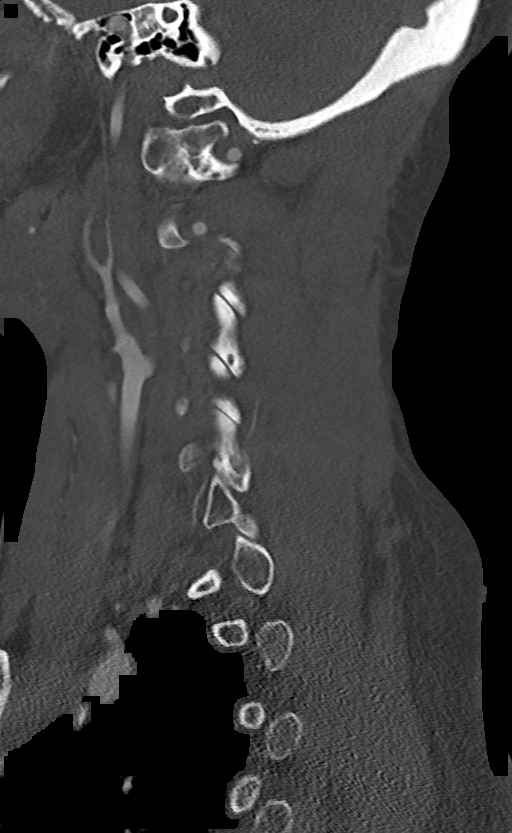
[im 29/70  bone]
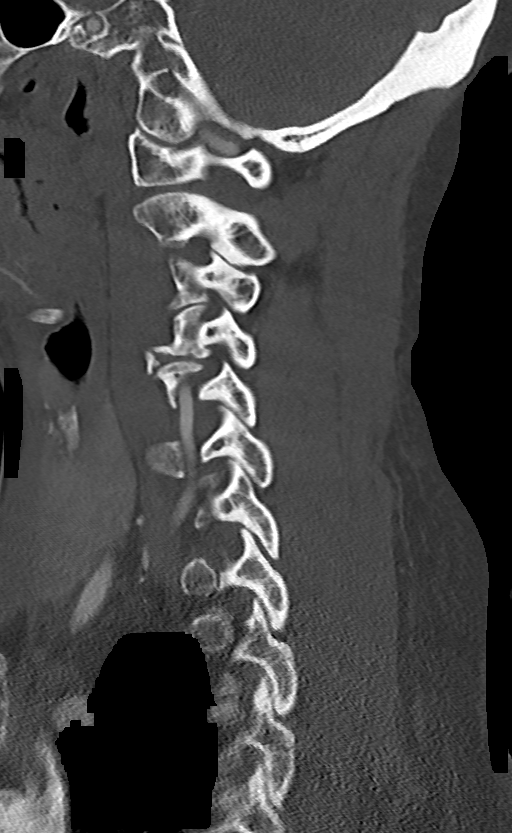
[im 35/70  soft-tissue]
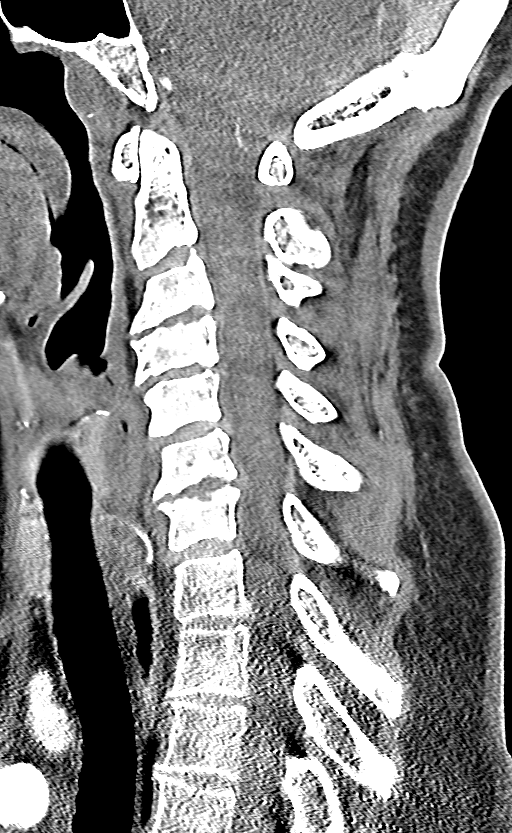
[im 35/70  bone]
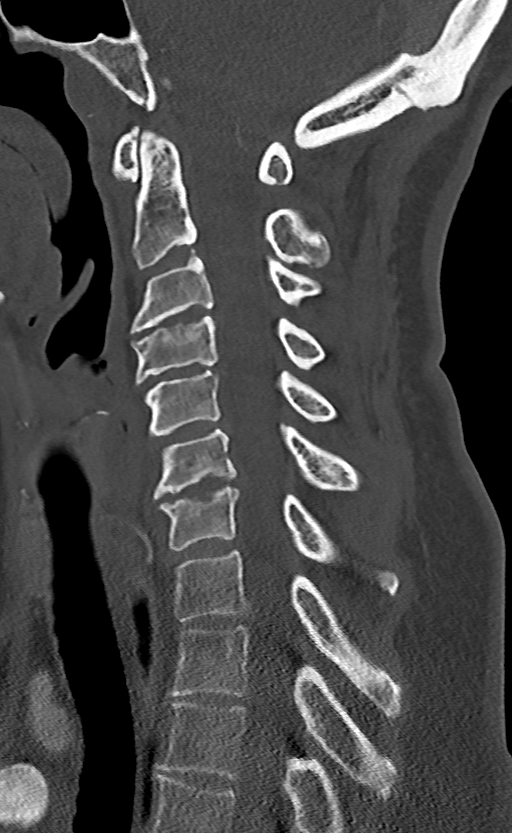
[im 41/70  bone]
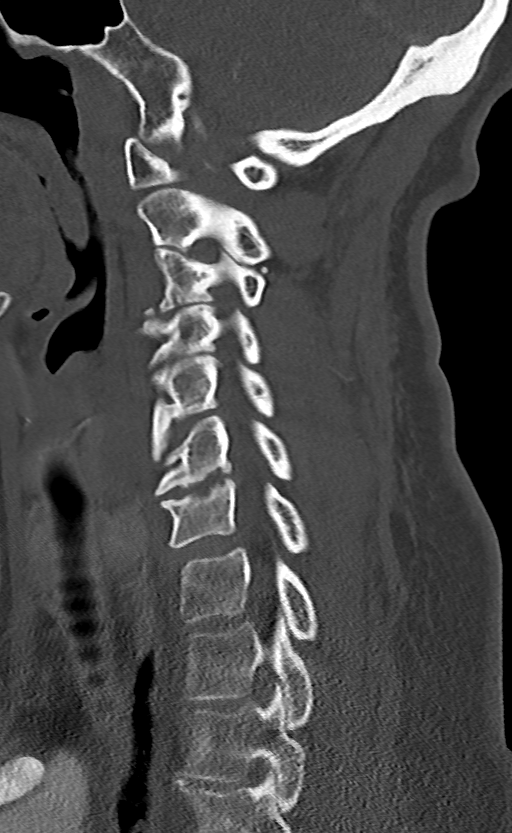
[im 47/70  bone]
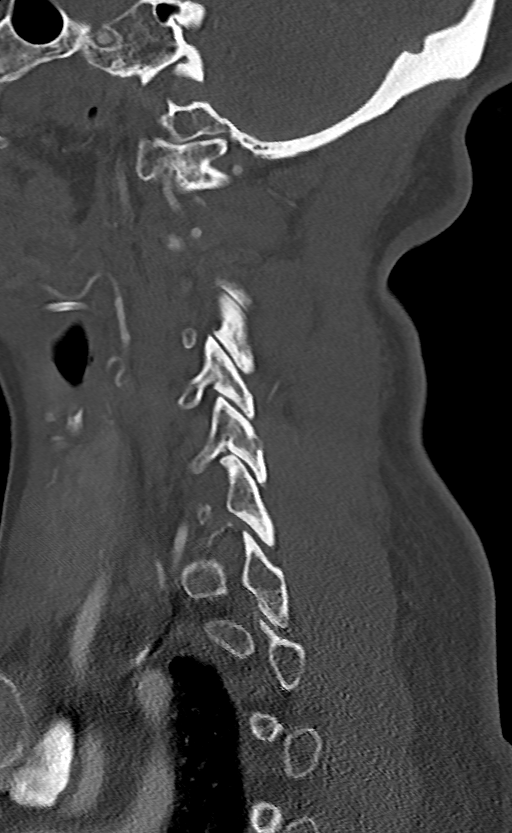

[12 of 33 positions shown; findings below may reference images not displayed]

FINDINGS: Alignment: Subtle grade 1 retrolisthesis of C4 on C5, likely
degenerative. Straightening of the cervical lordosis.

Skull base and vertebrae: No cervical compression fracture or
fracture lucency. Advanced narrowing and sclerosis at the
atlantoaxial articulation. Small intravertebral disc herniations at
C4 and C7.

Soft tissues and spinal canal: No prevertebral fluid or swelling. No
visible canal hematoma. Goiter.

Disc levels: Moderate disc space narrowing and bulky spondylosis
from C3 through C7. No posttraumatic widening or displacement.

Upper chest: No apical pneumothorax or suspicious pulmonary nodule.

Other: Benign nuchal calcifications at the C5-6 levels. Mild bone
demineralization.
IMPRESSION: Moderate osteoarthropathy with cervical spondylosis from C3 through
C7, and likely degenerative grade 1 retrolisthesis of C4 on C5.
Cervical muscle spasm.

## 2020-09-04 IMAGING — CT CT ABD-PELV W/ CM
2 of 5 series · 15 of 46 positions shown, 17 images · IV contrast (omnipaque)
Comparison: Noncontrast CT 08/13/2018

CLINICAL DATA: Abdominal abscess/infection suspected Abd pain,
acute, generalized, with neutropenia IT pump, concern for
intr-abdominal infection

EXAM:
CT ABDOMEN AND PELVIS WITH CONTRAST
TECHNIQUE: Multidetector CT imaging of the abdomen and pelvis was performed
using the standard protocol following bolus administration of
intravenous contrast.
CONTRAST:  80 cc Omnipaque 300 IV

[Series 3: a/p w/ 5mm · axial · 0.88mm/px · z∈[+708,+1133]mm · 12 of 96 slices shown, 14 images]
[im 6/96  soft-tissue]
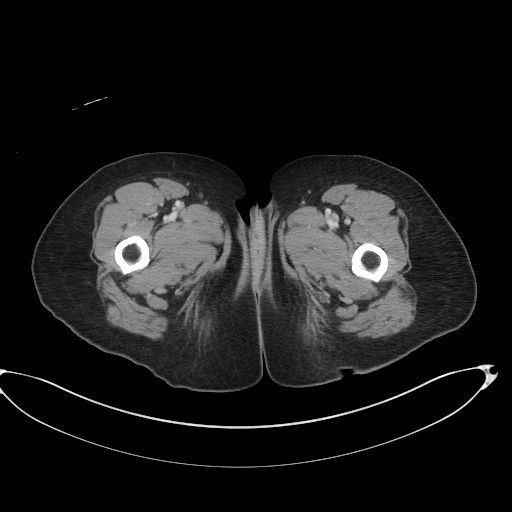
[im 6/96  bone]
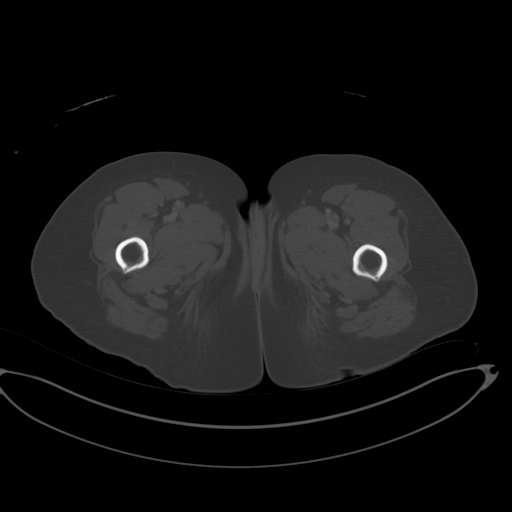
[im 16/96  soft-tissue]
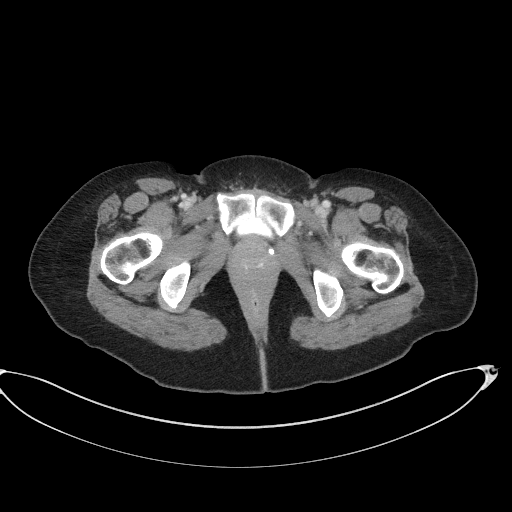
[im 21/96  soft-tissue]
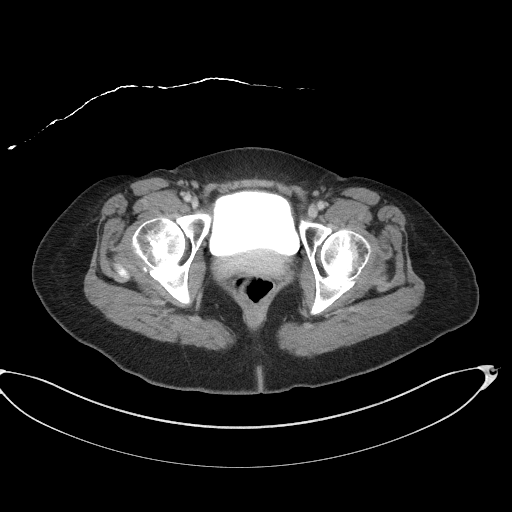
[im 31/96  soft-tissue]
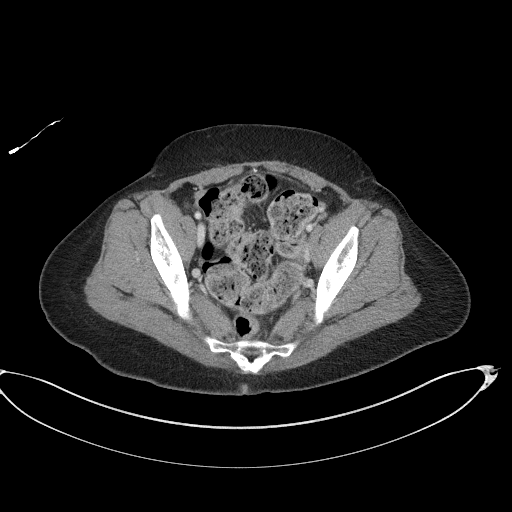
[im 36/96  soft-tissue]
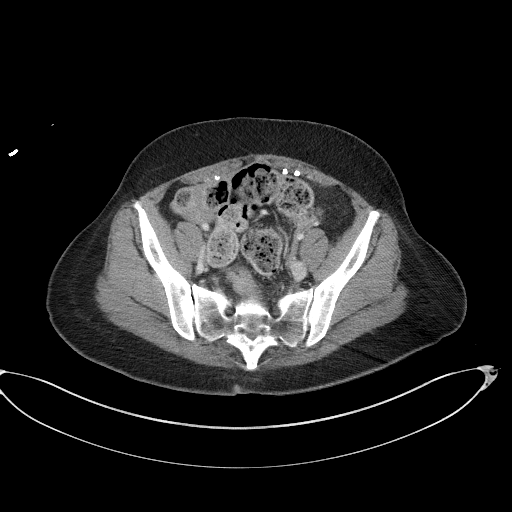
[im 46/96  soft-tissue]
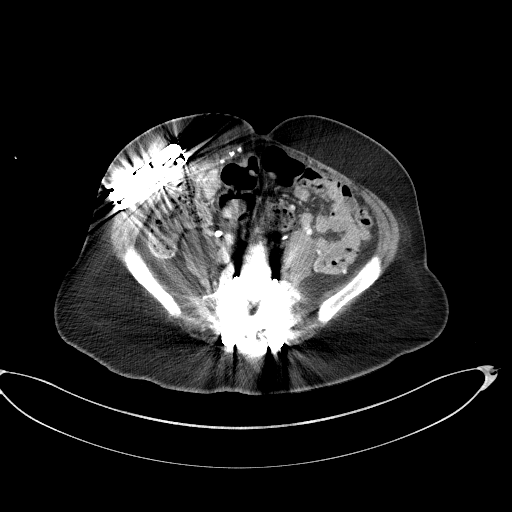
[im 51/96  soft-tissue]
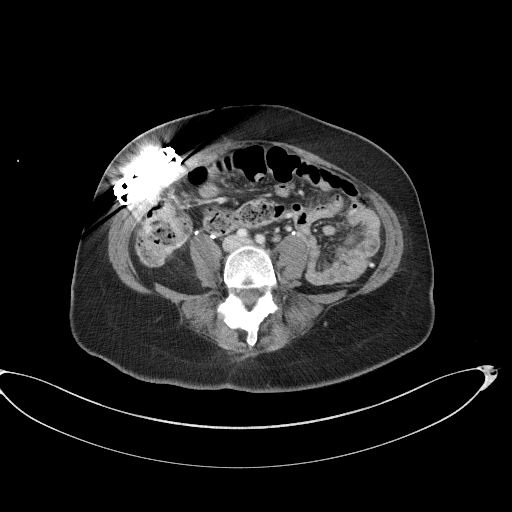
[im 61/96  soft-tissue]
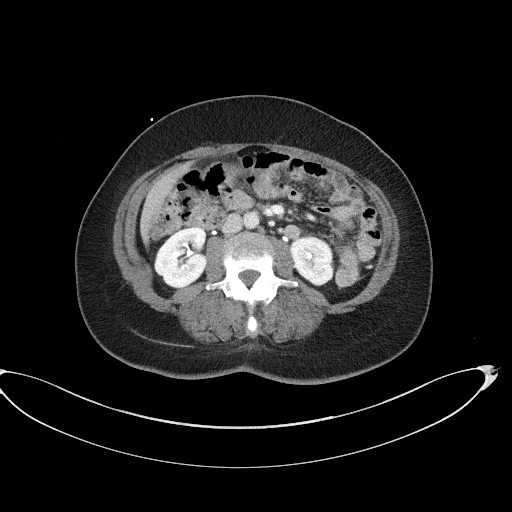
[im 66/96  soft-tissue]
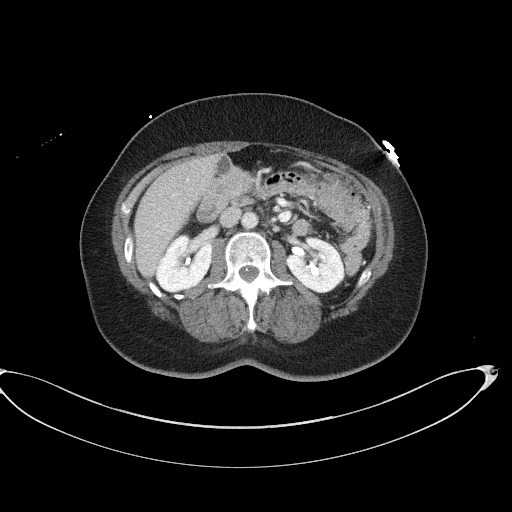
[im 66/96  bone]
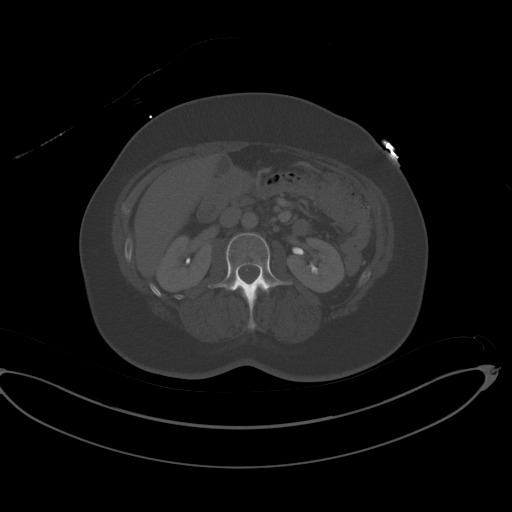
[im 76/96  soft-tissue]
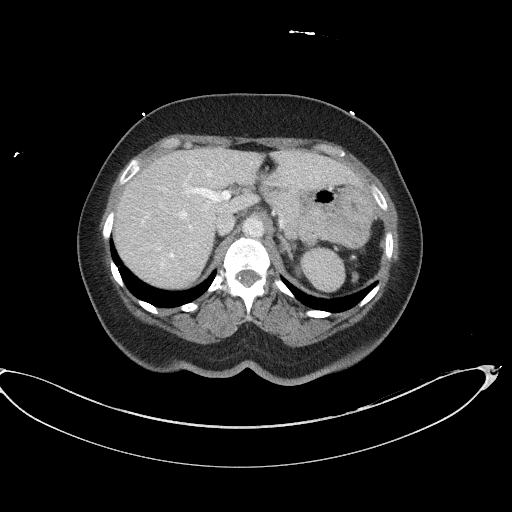
[im 81/96  soft-tissue]
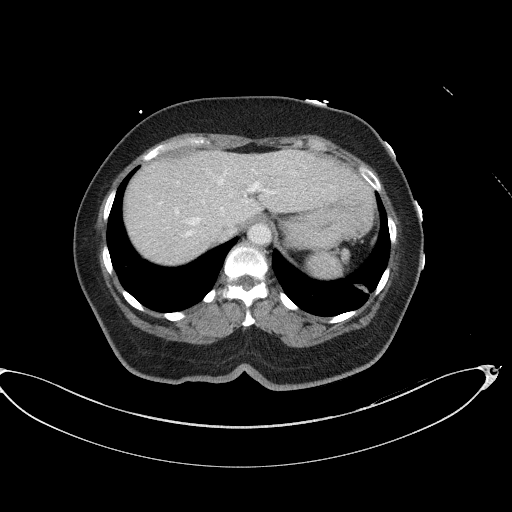
[im 91/96  soft-tissue]
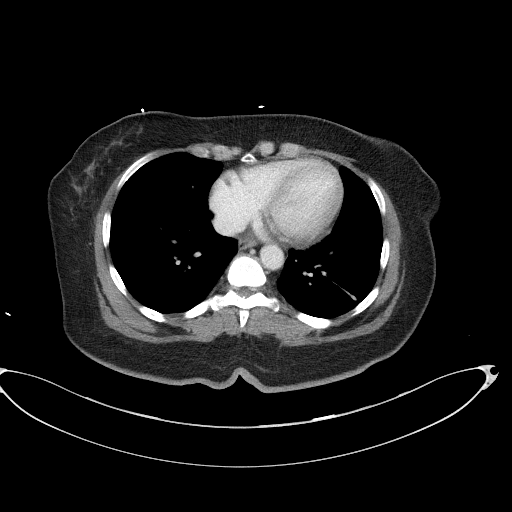

[Series 6: a/p w/ cor · coronal · 0.85mm/px · 3 of 143 slices shown]
[im 48/143  soft-tissue]
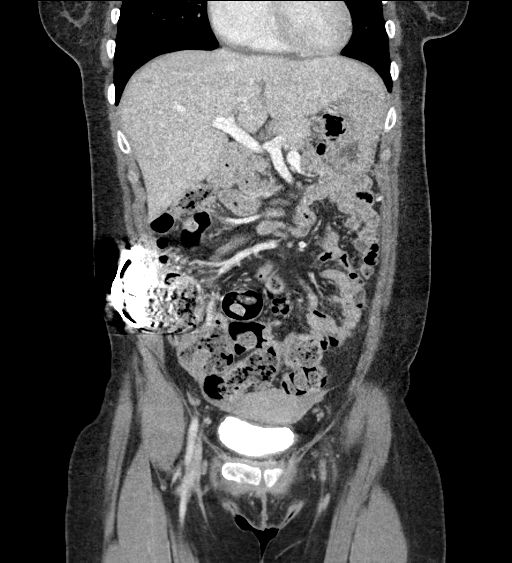
[im 64/143  soft-tissue]
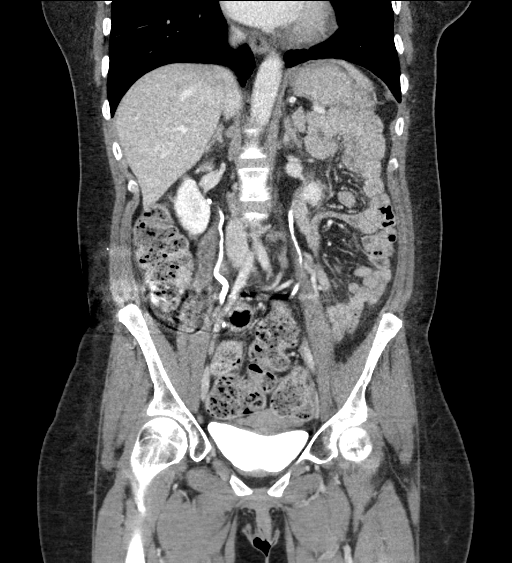
[im 79/143  soft-tissue]
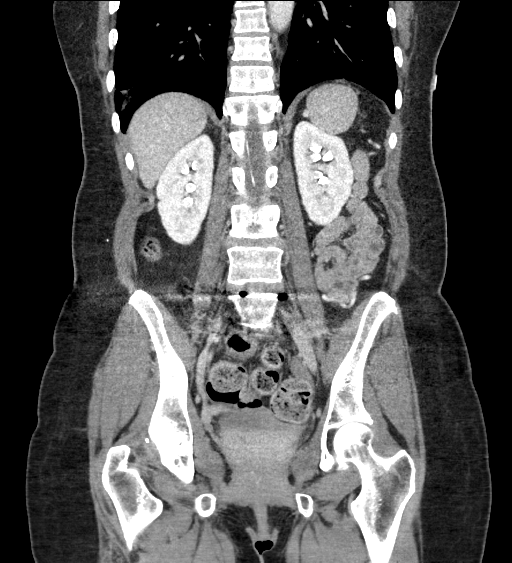

[15 of 46 positions shown; findings below may reference images not displayed]

FINDINGS: Lower chest: Bandlike atelectasis or scarring in both lower lobes.
No pleural fluid. No confluent consolidation. Heart is normal in
size.

Hepatobiliary: No focal liver abnormality is seen. No gallstones,
gallbladder wall thickening, or biliary dilatation.

Pancreas: No ductal dilatation or inflammation.

Spleen: Small in size. Multiple adjacent soft tissue deposits in the
left upper quadrant are consistent with splenule. Zhenya prior
injury.

Adrenals/Urinary Tract: There is excreted IV contrast in the renal
collecting systems on initial imaging limits assessment for renal
stones. Subcentimeter nodularity of the left adrenal gland. Normal
right adrenal gland. No hydronephrosis or perinephric edema.
Excreted IV contrast within both renal collecting systems and
ureters. Patient head and neck CTA earlier this day. No bladder wall
thickening.

Stomach/Bowel: Bowel evaluation is limited in the absence of enteric
contrast. Nondistended stomach. Duodenum approaches the midline,
however than remains in the right abdomen. Majority of the small
bowel loops are on the left. No small bowel obstruction or
inflammation. Appendix is not confidently visualized. No secondary
findings of acute appendicitis. Moderate to large stool burden
throughout the colon. Descending colon located in the central
abdomen. Mild sigmoid colonic tortuosity.

Vascular/Lymphatic: Normal caliber abdominal aorta. Portal vein is
patent. No enlarged lymph nodes in the abdomen or pelvis.

Reproductive: Uterus and bilateral adnexa are unremarkable.

Other: Prior anterior abdominal wall hernia repair with mesh. No
free air or free fluid in the abdomen or pelvis. No evidence of
intra-abdominal abscess. Battery pack overlies the right abdominal
wall subcutaneous tissues with associated streak artifact.

Musculoskeletal: Posterior fusion L5-S1. intrathecal pump in place
with tip not included in the field of view in the thoracic spine.
There are no acute or suspicious osseous abnormalities.
IMPRESSION: 1. No acute abnormality in the abdomen/pelvis. No evidence for
intra-abdominal infection.
2. Moderate to large stool burden throughout the colon, can be seen
with constipation.
3. Findings suggesting some degree of congenital bowel malrotation.
Ligament of Treitz appears in the right abdomen, small-bowel located
on the left and majority of the colon in the right. There is no
evidence of obstruction or bowel ischemia.

## 2020-09-04 IMAGING — DX DG CHEST 2V
2 series · 2 of 2 positions shown · non-contrast
Comparison: Chest radiograph dated 09/17/2019.

CLINICAL DATA: 53-year-old female with fall and right chest and
shoulder pain.

EXAM:
CHEST - 2 VIEW; RIGHT SHOULDER - 2+ VIEW

[chest lat]
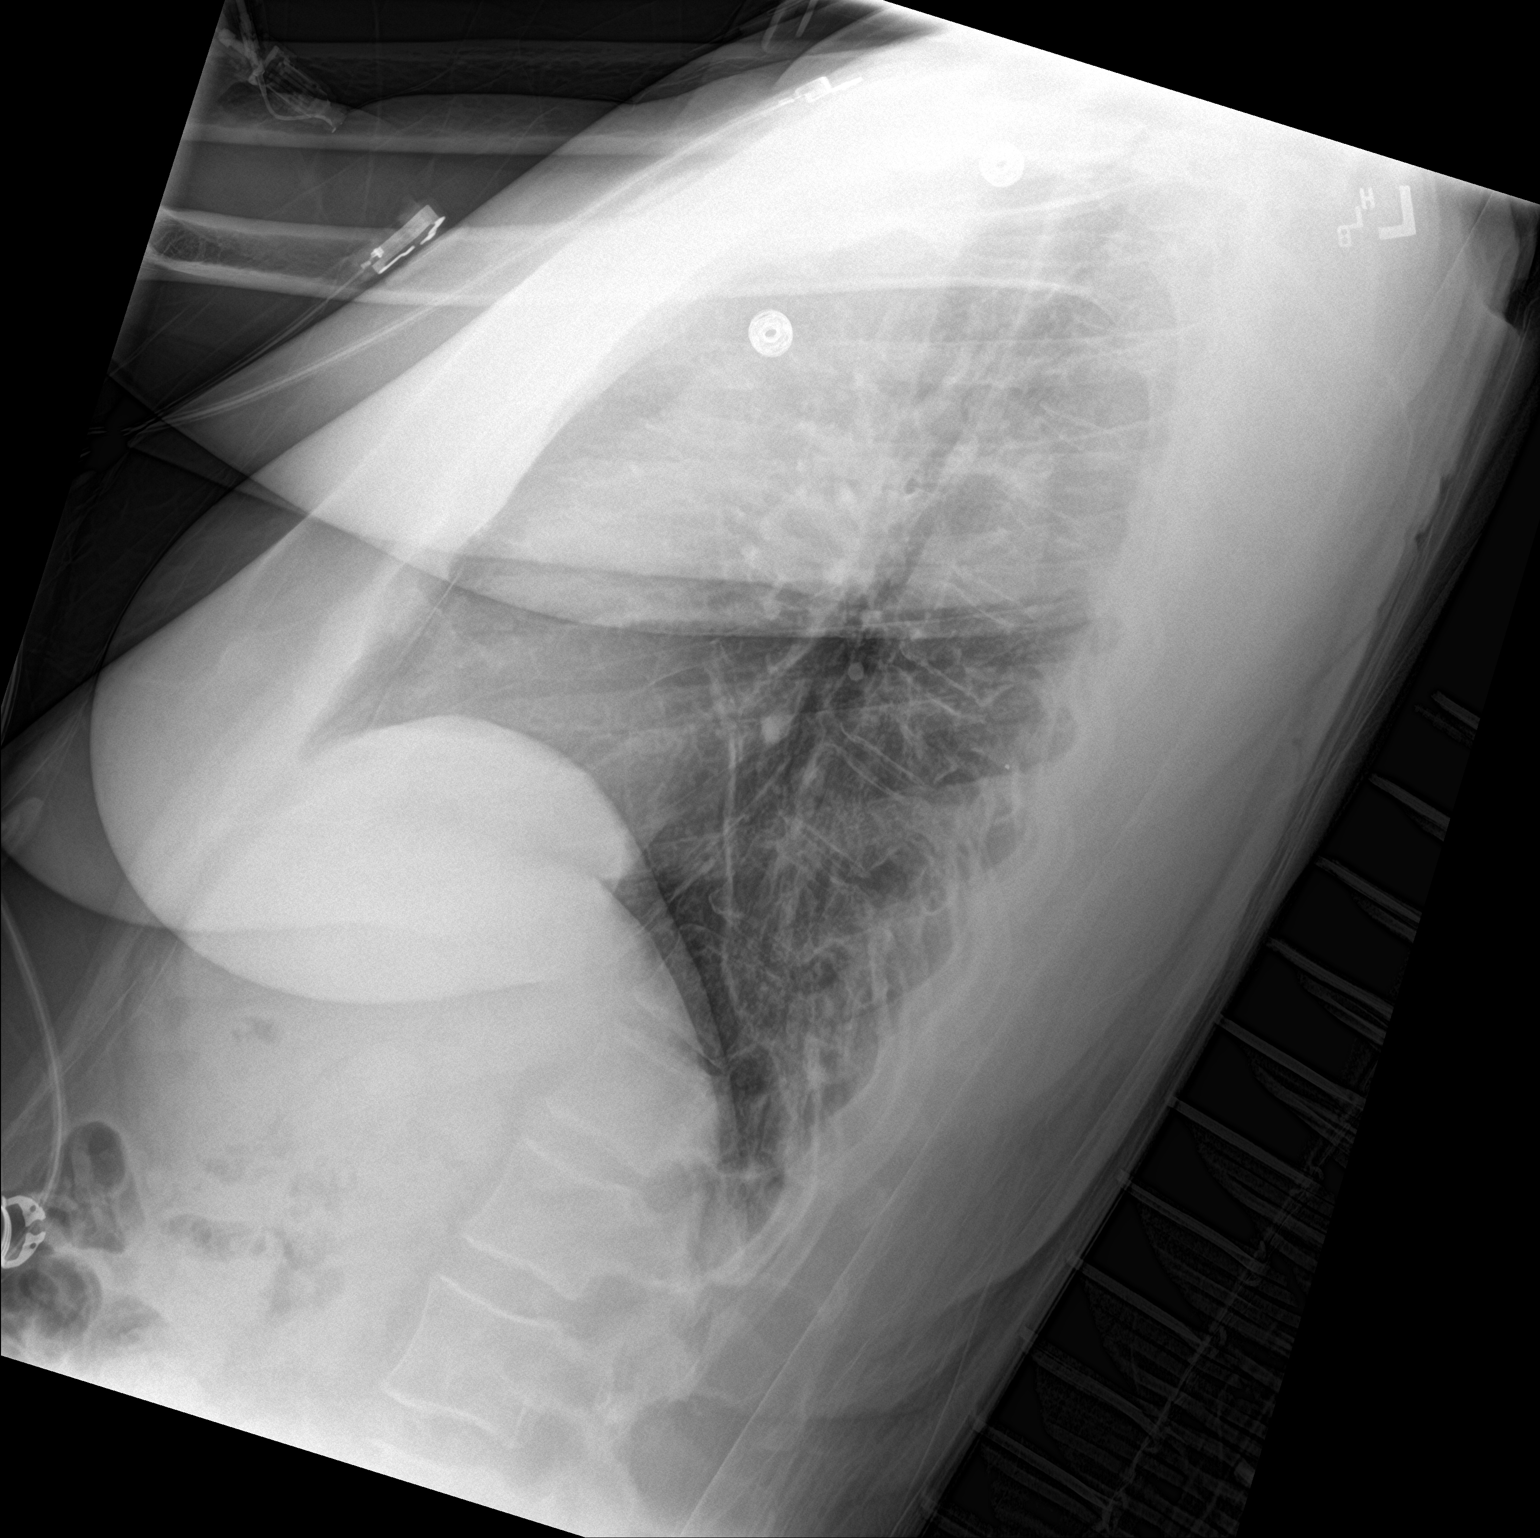

[chest ap]
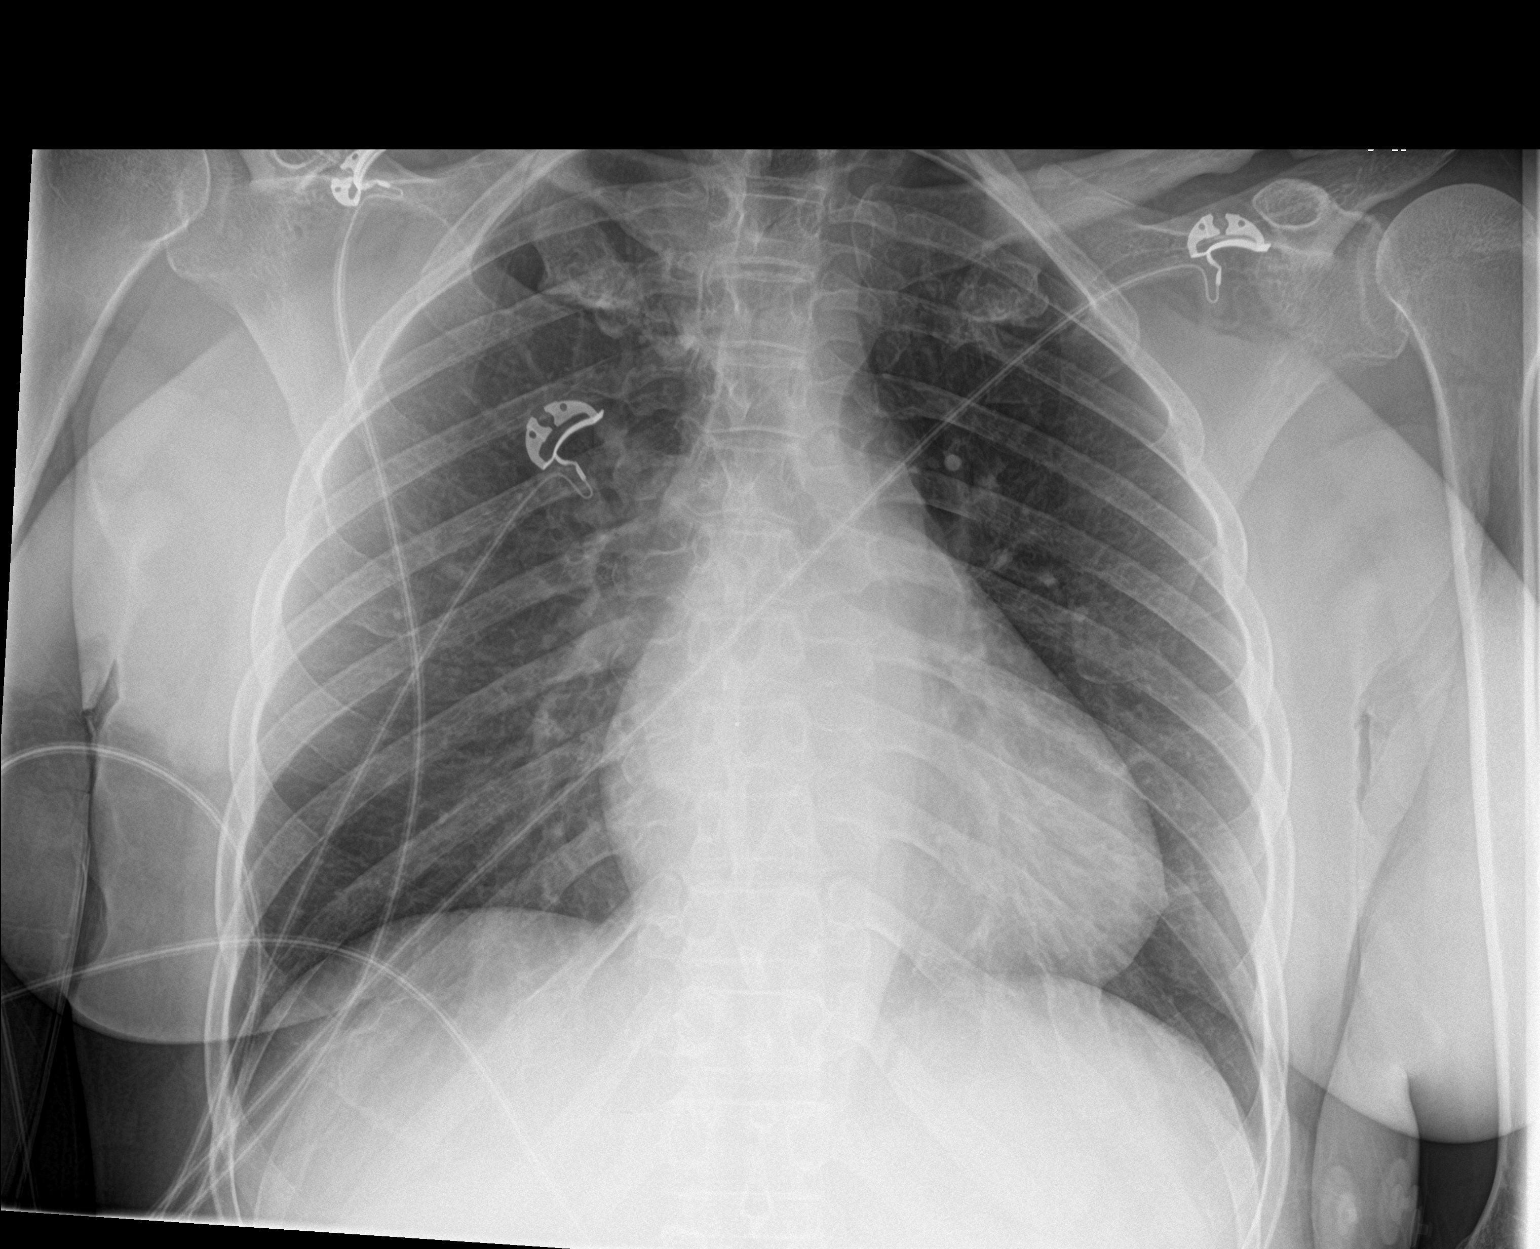

[2 of 2 positions shown; findings below may reference images not displayed]

FINDINGS: The lungs are clear. There is no pleural effusion or pneumothorax.
The cardiac silhouette is within limits. No acute osseous pathology.

There is no acute fracture or dislocation of the right shoulder. The
bones are well mineralized. No arthritic changes. The soft tissues
are unremarkable.
IMPRESSION: Negative.

## 2020-09-04 MED ORDER — ACCU-CHEK MULTICLIX LANCETS MISC: 12 refills | Status: DC

## 2020-09-06 ENCOUNTER — Other Ambulatory Visit: Payer: Self-pay

## 2020-09-06 DIAGNOSIS — E119 Type 2 diabetes mellitus without complications: Secondary | ICD-10-CM

## 2020-09-06 MED ORDER — JANUMET 50-500 MG PO TABS: 1.0000 | ORAL_TABLET | Freq: Two times a day (BID) | ORAL | 2 refills | Status: DC

## 2020-09-06 MED ORDER — TRESIBA FLEXTOUCH 100 UNIT/ML ~~LOC~~ SOPN: 10.0000 [IU] | PEN_INJECTOR | Freq: Every day | SUBCUTANEOUS | 0 refills | Status: DC

## 2020-09-08 LAB — HM DIABETES EYE EXAM

## 2020-09-11 ENCOUNTER — Other Ambulatory Visit: Payer: Self-pay

## 2020-09-11 ENCOUNTER — Encounter: Payer: Self-pay | Admitting: Nurse Practitioner

## 2020-09-11 ENCOUNTER — Telehealth: Payer: Medicare Other

## 2020-09-11 MED ORDER — PEN NEEDLES 32G X 4 MM MISC: 2 refills | Status: DC

## 2020-09-11 MED ORDER — DEXCOM G6 SENSOR MISC: 2 refills | Status: DC

## 2020-09-11 MED ORDER — DEXCOM G6 TRANSMITTER MISC: 2 refills | Status: DC

## 2020-09-11 MED ORDER — DEXCOM G6 RECEIVER DEVI: 2 refills | Status: DC

## 2020-09-13 ENCOUNTER — Telehealth: Payer: Self-pay | Admitting: Nurse Practitioner

## 2020-09-13 ENCOUNTER — Other Ambulatory Visit: Payer: Self-pay

## 2020-09-13 ENCOUNTER — Ambulatory Visit (INDEPENDENT_AMBULATORY_CARE_PROVIDER_SITE_OTHER): Payer: Medicare Other

## 2020-09-13 ENCOUNTER — Ambulatory Visit: Payer: Medicare Other | Admitting: Nurse Practitioner

## 2020-09-13 VITALS — BP 142/80 | HR 80 | Temp 97.7°F | Ht 66.0 in | Wt 164.0 lb

## 2020-09-13 DIAGNOSIS — Z1231 Encounter for screening mammogram for malignant neoplasm of breast: Secondary | ICD-10-CM

## 2020-09-13 DIAGNOSIS — Z0000 Encounter for general adult medical examination without abnormal findings: Secondary | ICD-10-CM

## 2020-09-13 DIAGNOSIS — I10 Essential (primary) hypertension: Secondary | ICD-10-CM | POA: Diagnosis not present

## 2020-09-13 MED ORDER — JANUMET 50-500 MG PO TABS: 1.0000 | ORAL_TABLET | Freq: Two times a day (BID) | ORAL | 2 refills | Status: DC

## 2020-09-13 NOTE — Patient Instructions (Signed)
Linda Mercado , Thank you for taking time to come for your Medicare Wellness Visit. I appreciate your ongoing commitment to your health goals. Please review the following plan we discussed and let me know if I can assist you in the future.   Screening recommendations/referrals: Colonoscopy: completed 12/09/2019 Mammogram: referral today Bone Density: n/a Recommended yearly ophthalmology/optometry visit for glaucoma screening and checkup Recommended yearly dental visit for hygiene and checkup  Vaccinations: Influenza vaccine: decline Pneumococcal vaccine: decline Tdap vaccine: completed 05/18/2014, due 05/18/2024 Shingles vaccine: discussed  Covid-19:  06/26/2020, 06/05/2020  Advanced directives: Advance directive discussed with you today. I have provided a copy for you to complete at home and have notarized. Once this is complete please bring a copy in to our office so we can scan it into your chart.  Conditions/risks identified: none  Next appointment: Follow up in one year for your annual wellness visit.   Preventive Care 40-64 Years, Female Preventive care refers to lifestyle choices and visits with your health care provider that can promote health and wellness. What does preventive care include?  A yearly physical exam. This is also called an annual well check.  Dental exams once or twice a year.  Routine eye exams. Ask your health care provider how often you should have your eyes checked.  Personal lifestyle choices, including:  Daily care of your teeth and gums.  Regular physical activity.  Eating a healthy diet.  Avoiding tobacco and drug use.  Limiting alcohol use.  Practicing safe sex.  Taking low-dose aspirin daily starting at age 86.  Taking vitamin and mineral supplements as recommended by your health care provider. What happens during an annual well check? The services and screenings done by your health care provider during your annual well check will depend  on your age, overall health, lifestyle risk factors, and family history of disease. Counseling  Your health care provider may ask you questions about your:  Alcohol use.  Tobacco use.  Drug use.  Emotional well-being.  Home and relationship well-being.  Sexual activity.  Eating habits.  Work and work Statistician.  Method of birth control.  Menstrual cycle.  Pregnancy history. Screening  You may have the following tests or measurements:  Height, weight, and BMI.  Blood pressure.  Lipid and cholesterol levels. These may be checked every 5 years, or more frequently if you are over 3 years old.  Skin check.  Lung cancer screening. You may have this screening every year starting at age 83 if you have a 30-pack-year history of smoking and currently smoke or have quit within the past 15 years.  Fecal occult blood test (FOBT) of the stool. You may have this test every year starting at age 67.  Flexible sigmoidoscopy or colonoscopy. You may have a sigmoidoscopy every 5 years or a colonoscopy every 10 years starting at age 39.  Hepatitis C blood test.  Hepatitis B blood test.  Sexually transmitted disease (STD) testing.  Diabetes screening. This is done by checking your blood sugar (glucose) after you have not eaten for a while (fasting). You may have this done every 1-3 years.  Mammogram. This may be done every 1-2 years. Talk to your health care provider about when you should start having regular mammograms. This may depend on whether you have a family history of breast cancer.  BRCA-related cancer screening. This may be done if you have a family history of breast, ovarian, tubal, or peritoneal cancers.  Pelvic exam and Pap test. This  may be done every 3 years starting at age 30. Starting at age 55, this may be done every 5 years if you have a Pap test in combination with an HPV test.  Bone density scan. This is done to screen for osteoporosis. You may have this scan  if you are at high risk for osteoporosis. Discuss your test results, treatment options, and if necessary, the need for more tests with your health care provider. Vaccines  Your health care provider may recommend certain vaccines, such as:  Influenza vaccine. This is recommended every year.  Tetanus, diphtheria, and acellular pertussis (Tdap, Td) vaccine. You may need a Td booster every 10 years.  Zoster vaccine. You may need this after age 61.  Pneumococcal 13-valent conjugate (PCV13) vaccine. You may need this if you have certain conditions and were not previously vaccinated.  Pneumococcal polysaccharide (PPSV23) vaccine. You may need one or two doses if you smoke cigarettes or if you have certain conditions. Talk to your health care provider about which screenings and vaccines you need and how often you need them. This information is not intended to replace advice given to you by your health care provider. Make sure you discuss any questions you have with your health care provider. Document Released: 08/04/2015 Document Revised: 03/27/2016 Document Reviewed: 05/09/2015 Elsevier Interactive Patient Education  2017 [employer] Prevention in the Home Falls can cause injuries. They can happen to people of all ages. There are many things you can do to make your home safe and to help prevent falls. What can I do on the outside of my home?  Regularly fix the edges of walkways and driveways and fix any cracks.  Remove anything that might make you trip as you walk through a door, such as a raised step or threshold.  Trim any bushes or trees on the path to your home.  Use bright outdoor lighting.  Clear any walking paths of anything that might make someone trip, such as rocks or tools.  Regularly check to see if handrails are loose or broken. Make sure that both sides of any steps have handrails.  Any raised decks and porches should have guardrails on the edges.  Have any  leaves, snow, or ice cleared regularly.  Use sand or salt on walking paths during winter.  Clean up any spills in your garage right away. This includes oil or grease spills. What can I do in the bathroom?  Use night lights.  Install grab bars by the toilet and in the tub and shower. Do not use towel bars as grab bars.  Use non-skid mats or decals in the tub or shower.  If you need to sit down in the shower, use a plastic, non-slip stool.  Keep the floor dry. Clean up any water that spills on the floor as soon as it happens.  Remove soap buildup in the tub or shower regularly.  Attach bath mats securely with double-sided non-slip rug tape.  Do not have throw rugs and other things on the floor that can make you trip. What can I do in the bedroom?  Use night lights.  Make sure that you have a light by your bed that is easy to reach.  Do not use any sheets or blankets that are too big for your bed. They should not hang down onto the floor.  Have a firm chair that has side arms. You can use this for support while you get dressed.  Do not have throw rugs and other things on the floor that can make you trip. What can I do in the kitchen?  Clean up any spills right away.  Avoid walking on wet floors.  Keep items that you use a lot in easy-to-reach places.  If you need to reach something above you, use a strong step stool that has a grab bar.  Keep electrical cords out of the way.  Do not use floor polish or wax that makes floors slippery. If you must use wax, use non-skid floor wax.  Do not have throw rugs and other things on the floor that can make you trip. What can I do with my stairs?  Do not leave any items on the stairs.  Make sure that there are handrails on both sides of the stairs and use them. Fix handrails that are broken or loose. Make sure that handrails are as long as the stairways.  Check any carpeting to make sure that it is firmly attached to the stairs.  Fix any carpet that is loose or worn.  Avoid having throw rugs at the top or bottom of the stairs. If you do have throw rugs, attach them to the floor with carpet tape.  Make sure that you have a light switch at the top of the stairs and the bottom of the stairs. If you do not have them, ask someone to add them for you. What else can I do to help prevent falls?  Wear shoes that:  Do not have high heels.  Have rubber bottoms.  Are comfortable and fit you well.  Are closed at the toe. Do not wear sandals.  If you use a stepladder:  Make sure that it is fully opened. Do not climb a closed stepladder.  Make sure that both sides of the stepladder are locked into place.  Ask someone to hold it for you, if possible.  Clearly mark and make sure that you can see:  Any grab bars or handrails.  First and last steps.  Where the edge of each step is.  Use tools that help you move around (mobility aids) if they are needed. These include:  Canes.  Walkers.  Scooters.  Crutches.  Turn on the lights when you go into a dark area. Replace any light bulbs as soon as they burn out.  Set up your furniture so you have a clear path. Avoid moving your furniture around.  If any of your floors are uneven, fix them.  If there are any pets around you, be aware of where they are.  Review your medicines with your doctor. Some medicines can make you feel dizzy. This can increase your chance of falling. Ask your doctor what other things that you can do to help prevent falls. This information is not intended to replace advice given to you by your health care provider. Make sure you discuss any questions you have with your health care provider. Document Released: 05/04/2009 Document Revised: 12/14/2015 Document Reviewed: 08/12/2014 Elsevier Interactive Patient Education  2017 [employer].

## 2020-09-13 NOTE — Progress Notes (Signed)
This visit occurred during the SARS-CoV-2 public health emergency.  Safety protocols were in place, including screening questions prior to the visit, additional usage of staff PPE, and extensive cleaning of exam room while observing appropriate contact time as indicated for disinfecting solutions.  Subjective:   Linda Mercado is a 54 y.o. female who presents for an Initial Medicare Annual Wellness Visit.  Review of Systems     Cardiac Risk Factors include: diabetes mellitus;sedentary lifestyle     Objective:    Today's Vitals   09/13/20 0909 09/13/20 0916  BP: (!) 142/80   Pulse: 80   Temp: 97.7 F (36.5 C)   TempSrc: Oral   Weight: 164 lb (74.4 kg)   Height: 5' 6"  (1.676 m)   PainSc:  10-Worst pain ever   Body mass index is 26.47 kg/m.  Advanced Directives 09/13/2020 10/07/2019 02/23/2019 09/17/2018 08/13/2018 03/19/2018 01/04/2018  Does Patient Have a Medical Advance Directive? No No No No No No No  Would patient like information on creating a medical advance directive? Yes (MAU/Ambulatory/Procedural Areas - Information given) No - Patient declined Yes (MAU/Ambulatory/Procedural Areas - Information given) (No Data) No - Patient declined Yes (Inpatient - patient requests chaplain consult to create a medical advance directive) No - Patient declined    Current Medications (verified) Outpatient Encounter Medications as of 09/13/2020  Medication Sig  . ACCU-CHEK GUIDE test strip TEST TWICE DAILY AS DIRECTED  . albuterol (VENTOLIN HFA) 108 (90 Base) MCG/ACT inhaler INHALE 2 PUFFS INTO THE LUNGS EVERY 6 HOURS AS NEEDED FOR WHEEZING OR SHORTNESS OF BREATH  . AMITIZA 24 MCG capsule Take 24 mcg by mouth 2 (two) times daily.   [name] atenolol (TENORMIN) 100 MG tablet Take 1 tablet (100 mg total) by mouth daily.  [name] atorvastatin (LIPITOR) 10 MG tablet Take 1 tablet (10 mg total) by mouth at bedtime.  . benzonatate (TESSALON PERLES) 100 MG capsule Take 1 capsule (100 mg total) by mouth every 6 (six)  hours as needed.  . Blood Glucose Monitoring Suppl (ACCU-CHEK GUIDE) w/Device KIT USE TO CHECK BLOOD SUGAR TWICE DAILY  . CAPLYTA 42 MG CAPS Take 42 mg by mouth at bedtime.   . clonazePAM (KLONOPIN) 1 MG tablet Take 1 mg by mouth 3 (three) times daily.  [name] DEXILANT 60 MG capsule Take 1 capsule by mouth daily. (Patient taking differently: Take 60 mg by mouth daily.)  . diclofenac Sodium (VOLTAREN) 1 % GEL APPLY 4 GRAMS TO THE AFFECTED AREA FOUR TIMES DAILY  . docusate sodium (COLACE) 50 MG capsule Take 50 mg by mouth daily.   . fluticasone (FLOVENT HFA) 110 MCG/ACT inhaler Inhale 2 puffs into the lungs every 12 (twelve) hours.  . hydrochlorothiazide (HYDRODIURIL) 12.5 MG tablet TAKE 1 TABLET(12.5 MG) BY MOUTH DAILY  . HYDROmorphone (DILAUDID) 4 MG tablet Take 4 mg by mouth 4 (four) times daily as needed for severe pain.  . hydrOXYzine (ATARAX/VISTARIL) 25 MG tablet Take 25 mg by mouth in the morning, at noon, in the evening, and at bedtime.  . insulin degludec (TRESIBA FLEXTOUCH) 100 UNIT/ML FlexTouch Pen Inject 10 Units into the skin daily. Sample  . Insulin Pen Needle (PEN NEEDLES) 32G X 4 MM MISC Use as directed with tresiba  . Lancets (ACCU-CHEK MULTICLIX) lancets Use to check blood sugars daily E11.69  . lidocaine (XYLOCAINE) 2 % solution Use as directed 15 mLs in the mouth or throat 3 (three) times a week.   . Lidocaine 4 % PTCH Place 1 patch  onto the skin in the morning and at bedtime.  [name] LORazepam (ATIVAN) 2 MG tablet Take 2 mg by mouth 4 (four) times daily as needed for anxiety.   [name] losartan (COZAAR) 50 MG tablet Take 1 tablet (50 mg total) by mouth daily.  . Multiple Vitamin (MULTIVITAMIN WITH MINERALS) TABS tablet Take 1 tablet by mouth daily.   [name] NARCAN 4 MG/0.1ML LIQD nasal spray kit Place 1 spray into the nose as directed.  . Norgestimate-Ethinyl Estradiol Triphasic (TRI-ESTARYLLA) 0.18/0.215/0.25 MG-35 MCG tablet Take 1 tablet by mouth daily.  . ondansetron (ZOFRAN) 4 MG tablet Take  4 mg by mouth as needed for nausea.   [name] PAIN MANAGEMENT INTRATHECAL, IT, PUMP 1 each by Intrathecal route continuous. Intrathecal (IT) medication: fentanyl, clonidine, bupivicaine  Fentanyl 243m/bupivicaine150mg/clonidine67mg  51.974mper day, 67m73mer day, 1.45m80m5.4ml/33mn  . promethazine (PHENERGAN) 25 MG tablet Take 1 tablet (25 mg total) by mouth every 6 (six) hours as needed for nausea or vomiting.  . QUEMarland Kitcheniapine (SEROQUEL XR) 400 MG 24 hr tablet Take 400 mg by mouth 2 (two) times daily.  . QUEtiapine (SEROQUEL) 300 MG tablet Take 300 mg by mouth at bedtime.  . tiZMarland KitchenNidine (ZANAFLEX) 4 MG tablet Take 4 mg by mouth every 4 (four) hours.  . traZODone (DESYREL) 100 MG tablet 5 qhs prescribed by psychiatry (Patient taking differently: Take 100-200 mg by mouth at bedtime. 5 qhs prescribed by psychiatry)  . triamcinolone cream (KENALOG) 0.1 % Apply 1 application topically daily as needed (for itching).  . TRINTELLIX 20 MG TABS tablet Take 20 mg by mouth daily.  . calcium gluconate 500 MG tablet Take 1 tablet (500 mg total) by mouth 3 (three) times daily. (Patient not taking: Reported on 09/13/2020)  . Continuous Blood Gluc Receiver (DEXCOM G6 RECEIVER) DEVI Use to check blood sugars 4 times daily (Patient not taking: Reported on 09/13/2020)  . Continuous Blood Gluc Sensor (DEXCOM G6 SENSOR) MISC Use to check blood sugars 4 times daily (Patient not taking: Reported on 09/13/2020)  . Continuous Blood Gluc Transmit (DEXCOM G6 TRANSMITTER) MISC Use to check blood sugars 4 times [id number] not taking: Reported on 09/13/2020)  . Cyanocobalamin (B-12) 500 MCG SUBL One SL QD (Patient not taking: Reported on 09/13/2020)  . fluconazole (DIFLUCAN) 100 MG tablet Take 1 tablet (100 mg total) by mouth daily. Take 1 tablet by mouth now repeat in 5 days (Patient not taking: Reported on 09/13/2020)  . polyethylene glycol powder (GLYCOLAX/MIRALAX) 17 GM/SCOOP powder MIX 17 GRAMS IN 8 OUNCES OF LIQUID AND DRINK DAILY  FOR BOWELS (Patient not taking: No sig reported)  . sitaGLIPtin (JANUVIA) 25 MG tablet Take 1 tablet (25 mg total) by mouth daily. (Patient not taking: Reported on 09/13/2020)  . VRAYLAR 6 MG CAPS Take 6 mg by mouth daily. (Patient not taking: Reported on 09/13/2020)  . [DISCONTINUED] sitaGLIPtin-metformin (JANUMET) 50-500 MG tablet Take 1 tablet by mouth 2 (two) times daily with a meal. (Patient not taking: Reported on 09/13/2020)   No facility-administered encounter medications on file as of 09/13/2020.    Allergies (verified) Lisinopril, Latex, Morphine and related, and Adhesive [tape]   History: Past Medical History:  Diagnosis Date  . Acid reflux   . Bradycardia   . Chronic back pain   . Diabetes mellitus without complication (HCC) [place] Hypertension   . Hypokalemia    Past Surgical History:  Procedure Laterality Date  . APPENDECTOMY    . BACK SURGERY  x6  . CESAREAN SECTION     x3  . ESOPHAGEAL MANOMETRY N/A 08/25/2019   Procedure: ESOPHAGEAL MANOMETRY (EM);  Surgeon: Carol Ada, MD;  Location: [facility];  Service: Endoscopy;  Laterality: N/A;  . HERNIA REPAIR     Family History  Problem Relation Age of Onset  . Diabetes Father   . Hypertension Father   . Kidney disease Mother   . Hypertension Mother   . Bipolar disorder Sister   . Schizophrenia Sister   . Neuropathy Sister   . Hypertension Sister   . Bipolar disorder Brother   . Bipolar disorder Brother   . Schizophrenia Brother   . Bipolar disorder Daughter   . Post-traumatic stress disorder Daughter   . Asthma Daughter   . Depression Daughter   . Bipolar disorder Daughter   . Post-traumatic stress disorder Daughter   . Asthma Daughter   . Depression Daughter   . Asthma Daughter   . Depression Daughter   . Asthma Daughter   . Stroke Neg Hx   . Cancer Neg Hx   . CAD Neg Hx    Social History   Socioeconomic History  . Marital status: Divorced    Spouse name: Not on file  . Number of children:  Not on file  . Years of education: Not on file  . Highest education level: Not on file  Occupational History  . Occupation: disability  Tobacco Use  . Smoking status: Never Smoker  . Smokeless tobacco: Never Used  Vaping Use  . Vaping Use: Never used  Substance and Sexual Activity  . Alcohol use: Not Currently  . Drug use: Never  . Sexual activity: Yes  Other Topics Concern  . Not on file  Social History Narrative   Caffeine- none   Social Determinants of Health   Financial Resource Strain: Low Risk   . Difficulty of Paying Living Expenses: Not hard at all  Food Insecurity: Food Insecurity Present  . Worried About Charity fundraiser in the Last Year: Sometimes true  . Ran Out of Food in the Last Year: Sometimes true  Transportation Needs: No Transportation Needs  . Lack of Transportation (Medical): No  . Lack of Transportation (Non-Medical): No  Physical Activity: Inactive  . Days of Exercise per Week: 0 days  . Minutes of Exercise per Session: 0 min  Stress: Stress Concern Present  . Feeling of Stress : Very much  Social Connections: Not on file    Tobacco Counseling Counseling given: Not Answered   Clinical Intake:  Pre-visit preparation completed: Yes  Pain : 0-10 Pain Score: 10-Worst pain ever Pain Location: Back Pain Orientation: Mid,Lower Pain Descriptors / Indicators: Stabbing Pain Onset: More than a month ago Pain Frequency: Constant     Nutritional Status: BMI 25 -29 Overweight Nutritional Risks: None Diabetes: Yes  How often do you need to have someone help you when you read instructions, pamphlets, or other written materials from your doctor or pharmacy?: 1 - Never What is the last grade level you completed in school?: 12th grade  Diabetic? Yes Nutrition Risk Assessment:  Has the patient had any N/V/D within the last 2 months?  No  Does the patient have any non-healing wounds?  No  Has the patient had any unintentional weight loss or  weight gain?  Yes   Diabetes:  Is the patient diabetic?  Yes  If diabetic, was a CBG obtained today?  No  Did the patient bring in their glucometer  from home?  No  How often do you monitor your CBG's? Twice daily.   Financial Strains and Diabetes Management:  Are you having any financial strains with the device, your supplies or your medication? Yes .  Does the patient want to be seen by Chronic Care Management for management of their diabetes?  No  Would the patient like to be referred to a Nutritionist or for Diabetic Management?  No   Diabetic Exams:  Diabetic Eye Exam: Completed 09/08/2020 Diabetic Foot Exam: Overdue, Pt has been advised about the importance in completing this exam. Pt is scheduled for diabetic foot exam on next appointment.   Interpreter Needed?: No  Information entered by :: NAllen LPN   Activities of Daily Living In your present state of health, do you have any difficulty performing the following activities: 09/13/2020 05/22/2020  Hearing? N N  Vision? N N  Difficulty concentrating or making decisions? N N  Walking or climbing stairs? Y Y  Dressing or bathing? Y Y  Comment at times -  Doing errands, shopping? N Y  Conservation officer, nature and eating ? N -  Using the Toilet? N -  In the past six months, have you accidently leaked urine? N -  Do you have problems with loss of bowel control? N -  Managing your Medications? N -  Managing your Finances? N -  Housekeeping or managing your Housekeeping? N -  Some recent data might be hidden    Patient Care Team: [name], FNP as PCP - General ([place]) Rex Kras, Claudette Stapler, RN as Case Manager Mayford Knife, [facility] (Pharmacist)  Indicate any recent Medical Services you may have received from other than [facility] providers in the past year (date may be approximate).     Assessment:   This is a routine wellness examination for Ikran.  Hearing/Vision screen  Hearing Screening   125Hz  250Hz  500Hz  1000Hz   2000Hz  3000Hz  4000Hz  6000Hz  8000Hz   Right ear:           Left ear:           Vision Screening Comments: Regular eye exams, [facility]  Dietary issues and exercise activities discussed: Current Exercise Habits: The patient does not participate in regular exercise at present  Goals    .  "I need help at home" (pt-stated)      CARE PLAN ENTRY (see longitudinal plan of care for additional care plan information)  Current Barriers:  [name] Knowledge Deficits related to resources for custodial care needs, home delivered meals and transportation  . Chronic Disease Management support and education needs related to Type 2 diabetes Mellitus, Essential hypertension, Chronic pain syndrome, Hashimoto's disease, Lumbar radiculopathy . Lacks caregiver support . Transportation barriers . Lacks social connections . Unable to perform ADLs independently . Unable to perform IADLs independently  Nurse Case Manager Clinical Goal(s):  [name] Over the next 90 days, patient will work with the embedded BSW Daneen Schick to address needs related to caregiver assistance including custodial care, home delivered meals and transportation  CCM RN CM Interventions:  07/26/20 call completed with patient  . Inter-disciplinary care team collaboration (see longitudinal plan of care) . Evaluation of current treatment plan related to level of care concerns and patient's adherence to plan as established by provider . Determined patient is currently receiving in home services from [facility] following recent COVID 19 infection  . Determined she continues to struggle with independent iADL's due to chronic pain syndrome and Impaired Physical Mobility  .  Determined patient remains to be engaged with embedded [place] for resources and support of level of care concerns  . Discussed plans with patient for ongoing care management follow up and provided patient with direct contact information for care management team  Patient  Self Care Activities:  . Continues to self administer medications as prescribed . Attends all scheduled provider appointments . Call pharmacy for medication refills . Call provider office for new concerns or questions . Continue to stay engaged with embedded BSW for support and resources needed for level of care concerns   Please see past updates related to this goal by clicking on the "Past Updates" button in the selected goal      .  "I need my equipment replaced" (pt-stated)      [place] (see longitudinal plan of care for additional care plan information)  Current Barriers:  . Patient needs replacement equipment including a manual W/C and 4 pronged cane  . Chronic Disease Management support and education needs related to Type 2 diabetes Mellitus, Essential hypertension, Chronic pain syndrome, Hashimoto's disease, Lumbar radiculopathy  Nurse Case Manager Clinical Goal(s):  [name] Over the next 45 days, patient will work with the CCM team and PCP  to address needs related to care coordination with DME needs  CCM RN CM Interventions:  07/26/20 call completed with patient  . Inter-disciplinary care team collaboration (see longitudinal plan of care) . Evaluation of current treatment plan related to DME needs  and patient's adherence to plan as established by provider. . Determined patient received her replacement manual W/C however, she continues to need a replacement standard walker . Sent secure message to PCP provider [name] FNP requesting an Rx be sent to [facility] for a standard walker  . Discussed plans with patient for ongoing care management follow up and provided patient with direct contact information for care management team  Patient Self Care Activities:  . Continue to self administer medications as prescribed . Attend all scheduled provider appointments . Call pharmacy for medication refills . Call provider office for new concerns or questions  Please see past  updates related to this goal by clicking on the "Past Updates" button in the selected goal      .  "to get my pain under better control" (pt-stated)      CARE PLAN ENTRY (see longitudinal plan of care for additional care plan information)  Current Barriers:  [name] Knowledge Deficits related to disease process and Self Health management of Chronic Pain  . Chronic Disease Management support and education needs related to Type 2 diabetes Mellitus, Essential hypertension, Chronic pain syndrome, Hashimoto's disease, Lumbar radiculopathy . Lacks caregiver support . Transportation barriers . Lacks social connections . Unable to perform ADLs independently . Unable to perform IADLs independently  Nurse Case Manager Clinical Goal(s):  [name] Over the next 180 days, patient will work with the CCM team and PCP to address needs related to disease education and support for improved Self Health management of Chronic Pain Syndrome   CCM RN CM Interventions:  07/26/20 call completed with patient  . Inter-disciplinary care team collaboration (see longitudinal plan of care) . Evaluation of current treatment plan related to Chronic Pain Syndrome and patient's adherence to plan as established by provider . Determined patient continues to have her Chronic pain managed by Dr. Cherly Anderson with [facility] with her next pump refill scheduled on 07/28/20 . Determined Dr. Cherly Anderson has scheduled a pump refill and replacement  to attempt to decrease patient's pain secondary to pump displacement set for 09/08/20, scheduled for outpatient but may require inpatient  . Reviewed medications with patient and discussed patient's current intrathecal pain regimen is as follows:   o Iohexol (OMNIPAQUE) 180 mg/mL injection o 1 o 05/26/2020 o   o  lidocaine 1% (PF) 10 mg/mL injection o 1 o 05/26/2020 o   o  promethazine (PHENERGAN) injection 25 mg o 1 o 05/26/2020 o    . Discussed plans with patient for ongoing care management follow up and  provided patient with direct contact information for care management team  Patient Self Care Activities:  . Continue to self administers medications as prescribed . Attend all scheduled provider appointments . Call pharmacy for medication refills . Call provider office for new concerns or questions   Please see past updates related to this goal by clicking on the "Past Updates" button in the selected goal      .  Patient Stated      09/13/2020, no goals    .  Pharmacy Care Plan (pt-stated)      CARE PLAN ENTRY (see longitudinal plan of care for additional care plan information)  Current Barriers:  . Chronic Disease Management support, education, and care coordination needs related to Hypertension, Hyperlipidemia, and Diabetes   Hypertension BP Readings from Last 3 Encounters:  05/22/20 120/76  10/28/19 118/80  10/12/19 118/80   . Pharmacist Clinical Goal(s): o Over the next 90 days, patient will work with PharmD and providers to maintain BP goal <130/80 . Current regimen:  o Losartan 25 MG tablet take daily  o Hydrochlorothiazide 12.5 MG take daily o Atenolol 100 MG tablet take daily o Clonidine 0.1 MG  take 1 tablet three times a day as needed  . Interventions: o Maintain heart healthy, balanced diet o Patient receiving a BP cuff through her insurance program . Patient self care activities - Over the next 90 days, patient will: o Check BP 2-3 times per week, document, and provide at future appointments o Ensure daily salt intake < 2300 mg/day  Hyperlipidemia Lab Results  Component Value Date/Time   LDLCALC 48 04/29/2019 03:53 PM   . Pharmacist Clinical Goal(s): o Over the next 90 days, patient will work with PharmD and providers to maintain LDL goal < 70 . Current regimen:  o Atorvastatin 10 MG -Take daily at bedtime . Interventions: o Using her BP cuff at least 2-3 times per week  . Patient self care activities - Over the next 90 days, patient will: o Continue  to limit fried foods o Take medications daily as directed o Try to exercise for 30 minutes daily 5 times per week (150 minutes per week total)  Diabetes Lab Results  Component Value Date/Time   HGBA1C 6.7 (H) 10/12/2019 05:03 PM   HGBA1C 6.5 (H) 04/29/2019 03:53 PM   . Pharmacist Clinical Goal(s): o Over the next 90 days, patient will work with PharmD and providers to maintain A1c goal <7% . Current regimen:  o Metformin 500 MG take twice daily with a meal . Interventions: o Encouraged continuation of diabetic-friendly, balanced diet . Patient self care activities - Over the next 90 days, patient will: o Check blood sugar twice daily, document, and provide at future appointments o Contact provider with any episodes of hypoglycemia  Vitamin D Deficiency . Pharmacist Clinical Goal(s) o Over the next 90 days, patient will work with PharmD to increase Vitamin D levels in patient to greater than  .  Current regimen:  o Vitamin D Ergocalciferol 1.25 MG -Take one capsule by mouth two times a week on Sunday and Thursday  . Interventions: o Collaborate with provider team to determine if patient should still be taking Vitamin D.  . Patient self care activities - Over the next 90 days, patient will: o Continue to go outside on sunny days   Medication management . Pharmacist Clinical Goal(s): o Over the next 90 days, patient will work with PharmD and providers to achieve optimal medication adherence . Current pharmacy: [facility]  . Interventions o Comprehensive medication review performed. o Continue current medication management strategy . Patient self care activities - Over the next 90 days, patient will: o Focus on medication adherence by using a medication reminder system o Take medications as prescribed o Report any questions or concerns to PharmD and/or provider(s)  Initial goal documentation   Orlando Penner, PharmD Clinical Pharmacist [facility] [phone]        Depression Screen PHQ 2/9 Scores 09/13/2020 03/25/2019 02/18/2019 09/17/2018 06/23/2018  PHQ - 2 Score 4 4 6 6 4   PHQ- 9 Score 12 18 19 19 22     Fall Risk Fall Risk  09/13/2020 04/29/2019 03/25/2019 02/18/2019 10/06/2018  Falls in the past year? 1 0 1 0 1  Comment lost balance - - - -  Number falls in past yr: 1 1 0 1 1  Injury with Fall? 0 1 1 0 1  Risk for fall due to : History of fall(s);Impaired balance/gait;Impaired mobility;Medication side effect - - - -  Follow up Falls evaluation completed;Education provided;Falls prevention discussed - - - -    FALL RISK PREVENTION PERTAINING TO THE HOME:  Any stairs in or around the home? Yes  If so, are there any without handrails? No  Home free of loose throw rugs in walkways, pet beds, electrical cords, etc? Yes  Adequate lighting in your home to reduce risk of falls? Yes   ASSISTIVE DEVICES UTILIZED TO PREVENT FALLS:  Life alert? No  Use of a cane, walker or w/c? Yes  Grab bars in the bathroom? No  Shower chair or bench in shower? No  Elevated toilet seat or a handicapped toilet? Yes   TIMED UP AND GO:  Was the test performed? No .    Gait slow and steady with assistive device  Cognitive Function:     6CIT Screen 09/13/2020 09/17/2018 09/17/2018  What Year? 0 points 0 points 0 points  What month? 0 points 0 points 0 points  What time? 0 points 0 points 0 points  Count back from 20 0 points 0 points 0 points  Months in reverse 0 points 0 points 2 points  Repeat phrase 4 points 10 points 2 points  Total Score 4 10 4     Immunizations Immunization History  Administered Date(s) Administered  . Influenza Inj Mdck Quad With Preservative 05/11/2018  . Influenza,inj,Quad PF,6+ Mos 09/19/2016, 04/24/2017  . PFIZER(Purple Top)SARS-COV-2 Vaccination 06/05/2020, 06/26/2020    TDAP status: Up to date  Flu Vaccine status: Declined, Education has been provided regarding the importance of this vaccine  but patient still declined. Advised may receive this vaccine at local pharmacy or Health Dept. Aware to provide a copy of the vaccination record if obtained from local pharmacy or Health Dept. Verbalized acceptance and understanding.  Pneumococcal vaccine status: Declined,  Education has been provided regarding the importance of this vaccine but patient still declined. Advised may receive this vaccine at local  pharmacy or Health Dept. Aware to provide a copy of the vaccination record if obtained from local pharmacy or Health Dept. Verbalized acceptance and understanding.   Covid-19 vaccine status: Completed vaccines  Qualifies for Shingles Vaccine? Yes   Zostavax completed No   Shingrix Completed?: No.    Education has been provided regarding the importance of this vaccine. Patient has been advised to call insurance company to determine out of pocket expense if they have not yet received this vaccine. Advised may also receive vaccine at local pharmacy or Health Dept. Verbalized acceptance and understanding.  Screening Tests Health Maintenance  Topic Date Due  . PNEUMOCOCCAL POLYSACCHARIDE VACCINE AGE 547-64 HIGH RISK  Never done  . MAMMOGRAM  Never done  . FOOT EXAM  04/28/2020  . INFLUENZA VACCINE  10/19/2020 (Originally 02/20/2020)  . COVID-19 Vaccine (3 - Booster for Pfizer series) 12/25/2020  . HEMOGLOBIN A1C  02/21/2021  . OPHTHALMOLOGY EXAM  09/08/2021  . PAP SMEAR-Modifier  04/28/2022  . TETANUS/TDAP  05/18/2024  . COLONOSCOPY (Pts 45-61yr Insurance coverage will need to be confirmed)  12/08/2029  . Hepatitis C Screening  Completed  . HIV Screening  Completed    Health Maintenance  Health Maintenance Due  Topic Date Due  . PNEUMOCOCCAL POLYSACCHARIDE VACCINE AGE 547-64 HIGH RISK  Never done  . MAMMOGRAM  Never done  . FOOT EXAM  04/28/2020    Colorectal cancer screening: Type of screening: Colonoscopy. Completed 12/09/2019. Repeat every 10 years  Mammogram status: Ordered  today. Pt provided with contact info and advised to call to schedule appt.   Bone Density status: n/a  Lung Cancer Screening: (Low Dose CT Chest recommended if Age 55-80years, 30 pack-year currently smoking OR have quit w/in 15years.) does not qualify.   Lung Cancer Screening Referral: no  Additional Screening:  Hepatitis C Screening: does qualify; Completed 08/24/2020  Vision Screening: Recommended annual ophthalmology exams for early detection of glaucoma and other disorders of the eye. Is the patient up to date with their annual eye exam?  Yes  Who is the provider or what is the name of the office in which the patient attends annual eye exams? ABouseIf pt is not established with a provider, would they like to be referred to a provider to establish care? No .   Dental Screening: Recommended annual dental exams for proper oral hygiene  Community Resource Referral / Chronic Care Management: CRR required this visit?  No   CCM required this visit?  No      Plan:     I have personally reviewed and noted the following in the patient's chart:   . Medical and social history . Use of alcohol, tobacco or illicit drugs  . Current medications and supplements . Functional ability and status . Nutritional status . Physical activity . Advanced directives . List of other physicians . Hospitalizations, surgeries, and ER visits in previous 12 months . Vitals . Screenings to include cognitive, depression, and falls . Referrals and appointments  In addition, I have reviewed and discussed with patient certain preventive protocols, quality metrics, and best practice recommendations. A written personalized care plan for preventive services as well as general preventive health recommendations were provided to patient.     NKellie Simmering LPN   26/22/6333  Nurse Notes:

## 2020-09-13 NOTE — Addendum Note (Signed)
Addended by: Kellie Simmering on: 09/13/2020 11:34 AM   Modules accepted: Level of Service

## 2020-09-13 NOTE — Telephone Encounter (Signed)
   Telephone encounter was:  Unsuccessful.  09/13/2020 Name: Linda Mercado MRN: [id number] DOB: 1965-09-18  Unsuccessful outbound call made today to assist with:  Food Insecurity  Outreach Attempt:  1st Attempt. Called patient voicemail was could not leave message at this time. Care Guide will reach out to patient again in a few days.  [place], Care Management Phone: [phone] Email: [email or web address]

## 2020-09-14 NOTE — Patient Instructions (Signed)
Visit Information It was great speaking with you today!  Please let me know if you have any questions about our visit.  Goals Addressed            This Visit's Progress   . Manage My Medicine       Timeframe:  Long-Range Goal Priority:  High Start Date:                             Expected End Date:                       Follow Up Date 09/27/2020   - call for medicine refill 2 or 3 days before it runs out - call if I am sick and can't take my medicine - use a pillbox to sort medicine - use an alarm clock or phone to remind me to take my medicine    Why is this important?   . These steps will help you keep on track with your medicines.          Patient Care Plan: CCM Pharmacy Care Plan    Problem Identified: HTN, HLD, DM   Priority: High    Long-Range Goal: Disease Management   This Visit's Progress: On track  Priority: High  Note:   Current Barriers:  . Unable to independently afford treatment regimen . Unable to achieve control of glucose levels.    Pharmacist Clinical Goal(s):  [name] Over the next 30 days, patient will achieve adherence to monitoring guidelines and medication adherence to achieve therapeutic efficacy through collaboration with PharmD and provider.   Interventions: . 1:1 collaboration with [name], FNP regarding development and update of comprehensive plan of care as evidenced by provider attestation and co-signature . Inter-disciplinary care team collaboration (see longitudinal plan of care) . Comprehensive medication review performed; medication list updated in electronic medical record  Hypertension (BP goal <130/80) -controlled -Current treatment: . Losartan 50 mg tablet daily  . Atenolol 100 mg take daily   . Hydrochlorothiazide 12.5 mg tablet daily  -Medications previously tried: losartan 25 mg,  -Current home readings: 126/80, 121/70 -Current dietary habits: patient is avoiding fried and fatty foods and she is using an air fryer,  she also uses an indoor grill for her meats  -Current exercise habits: none at this time  -Denies hypotensive/hypertensive symptoms -Educated on BP goals and benefits of medications for prevention of heart attack, stroke and kidney damage; Daily salt intake goal < 2300 mg; Exercise goal of 150 minutes per week; Importance of home blood pressure monitoring; Proper BP monitoring technique; -Counseled to monitor BP at home once per week, document, and provide log at future appointments -Recommended to continue current medication  Hyperlipidemia: (LDL goal < 70) -controlled -Current treatment: . Atorvastatin 10 mg tablet take daily  -Current exercise habits: she parks far from where she is going and uses her cane or walker, and she also does a lot around the house for the twins.  -Educated on Cholesterol goals;  Benefits of statin for ASCVD risk reduction; Importance of limiting foods high in cholesterol; Exercise goal of 150 minutes per week; -Counseled on diet and exercise extensively Recommended to continue current medication  Diabetes (A1c goal <7%) -uncontrolled -Current medications: . Tyler Aas - Inject 10 units once per day  . Janumet 50 - 500 mg - taking 1 tablet by mouth twice daily  -Medications previously tried:  -Current home glucose  readings -Patient was supposed to have esophagus stretched but her blood sugar was 345, she went to the office and her blood work showing her A1c was extremely high - Now checking her BS twice per day  . fasting glucose:  345, 260, 152   . post prandial glucose: she will check before dinner  -Reports hypoglycemic/hyperglycemic symptoms  -Was nauseous  -Current meal patterns: patient reports that she has been having a lot of sugar cravings  . Has been eating a lot of sugary stuff she did not discuss in detail  . drinks: 2-3 gallons of orange juice in 4 days to a week. She recently stopped drinking juice, now she is is drinking water  -Current  exercise: none at this time  -Educated onA1c and blood sugar goals; Complications of diabetes including kidney damage, retinal damage, and cardiovascular disease; Benefits of weight loss; Proper insulin injection technique; Benefits of routine self-monitoring of blood sugar; -Counseled to check feet daily and get yearly eye exams -Counseled on diet and exercise extensively Recommended to continue current medication Patients current dose of Janumet should be increased to Janumet 50-1000 mg take 1 tablet by mouth twice per day   Patient Goals/Self-Care Activities . Over the next 30 days, patient will:  - take medications as prescribed  Follow Up Plan: Telephone follow up appointment with care management team member scheduled for: 09/27/2020      Patient agreed to services and verbal consent obtained.   The patient verbalized understanding of instructions, educational materials, and care plan provided today and agreed to receive a mailed copy of patient instructions, educational materials, and care plan.   Orlando Penner, PharmD Clinical Pharmacist [facility] [phone]

## 2020-09-18 ENCOUNTER — Telehealth: Payer: Self-pay | Admitting: *Deleted

## 2020-09-18 NOTE — Telephone Encounter (Signed)
   Telephone encounter was:  Successful.  09/18/2020 Name: Linda Mercado MRN: [id number] DOB: Aug 05, 1965  Linda Mercado is a 55 y.o. year old female who is a primary care patient of [name], [place] . The community resource team was consulted for assistance with [place] guide performed the following interventions: Patient provided with information about care guide support team and interviewed to confirm resource needs Investigation of community resources performed Discussed resources to assist with Food delivery because she cannot leave the house to get food  Placed referral to [place] Caster with [place] care  via email.  Follow Up Plan:  Care guide will follow up with patient by phone over the next week , Care guide will outreach resources to assist patient with food delivery and grocery shopping and careguide will also One step further for delivery of food pantry items  [place], Care Management  [address] , [place] [address] Email : [email or web address]

## 2020-09-19 ENCOUNTER — Ambulatory Visit: Payer: Medicare Other | Admitting: Nurse Practitioner

## 2020-09-20 ENCOUNTER — Other Ambulatory Visit: Payer: Self-pay | Admitting: Nurse Practitioner

## 2020-09-20 ENCOUNTER — Encounter: Payer: Self-pay | Admitting: Nurse Practitioner

## 2020-09-20 DIAGNOSIS — R059 Cough, unspecified: Secondary | ICD-10-CM

## 2020-09-20 DIAGNOSIS — N898 Other specified noninflammatory disorders of vagina: Secondary | ICD-10-CM

## 2020-09-21 ENCOUNTER — Ambulatory Visit (INDEPENDENT_AMBULATORY_CARE_PROVIDER_SITE_OTHER): Payer: Medicare Other

## 2020-09-21 ENCOUNTER — Telehealth: Payer: Medicare Other

## 2020-09-21 DIAGNOSIS — E119 Type 2 diabetes mellitus without complications: Secondary | ICD-10-CM | POA: Diagnosis not present

## 2020-09-21 DIAGNOSIS — E063 Autoimmune thyroiditis: Secondary | ICD-10-CM

## 2020-09-21 DIAGNOSIS — I10 Essential (primary) hypertension: Secondary | ICD-10-CM

## 2020-09-21 DIAGNOSIS — M5416 Radiculopathy, lumbar region: Secondary | ICD-10-CM

## 2020-09-21 DIAGNOSIS — G894 Chronic pain syndrome: Secondary | ICD-10-CM

## 2020-09-27 ENCOUNTER — Telehealth: Payer: Self-pay | Admitting: *Deleted

## 2020-09-27 ENCOUNTER — Ambulatory Visit: Payer: Medicare Other

## 2020-09-27 DIAGNOSIS — E119 Type 2 diabetes mellitus without complications: Secondary | ICD-10-CM

## 2020-09-27 DIAGNOSIS — I10 Essential (primary) hypertension: Secondary | ICD-10-CM

## 2020-09-27 NOTE — Chronic Care Management (AMB) (Signed)
Chronic Care Management   CCM RN Visit Note  09/21/2020 Name: Linda Mercado MRN: [id number] DOB: [birth year]  Subjective: Linda Mercado is a 55 y.o. year old female who is a primary care patient of [name], [place]. The care management team was consulted for assistance with disease management and care coordination needs.    Engaged with patient by telephone for follow up visit in response to provider referral for case management and/or care coordination services.   Consent to Services:  The patient was given information about Chronic Care Management services, agreed to services, and gave verbal consent prior to initiation of services.  Please see initial visit note for detailed documentation.   Patient agreed to services and verbal consent obtained.   Assessment: Review of patient past medical history, allergies, medications, health status, including review of consultants reports, laboratory and other test data, was performed as part of comprehensive evaluation and provision of chronic care management services.   SDOH (Social Determinants of Health) assessments and interventions performed:    CCM Care Plan  Allergies  Allergen Reactions  . Lisinopril Hives    Other reaction(s): anaphylaxis/angioedema  . Latex Hives  . Morphine And Related     Itching, skin get hot.   . Adhesive [Tape] Rash    Outpatient Encounter Medications as of 09/21/2020  Medication Sig Note  . ACCU-CHEK GUIDE test strip TEST TWICE DAILY AS DIRECTED   . albuterol (VENTOLIN HFA) 108 (90 Base) MCG/ACT inhaler INHALE 2 PUFFS INTO THE LUNGS EVERY 6 HOURS AS NEEDED FOR WHEEZING OR SHORTNESS OF BREATH   . AMITIZA 24 MCG capsule Take 24 mcg by mouth 2 (two) times daily.    [name] atenolol (TENORMIN) 100 MG tablet Take 1 tablet (100 mg total) by mouth daily.   [name] atorvastatin (LIPITOR) 10 MG tablet Take 1 tablet (10 mg total) by mouth at bedtime.   . benzonatate (TESSALON PERLES) 100 MG capsule Take 1 capsule (100 mg  total) by mouth every 6 (six) hours as needed.   . Blood Glucose Monitoring Suppl (ACCU-CHEK GUIDE) w/Device KIT USE TO CHECK BLOOD SUGAR TWICE DAILY   . calcium gluconate 500 MG tablet Take 1 tablet (500 mg total) by mouth 3 (three) times daily.   . CAPLYTA 42 MG CAPS Take 42 mg by mouth at bedtime.    . clonazePAM (KLONOPIN) 1 MG tablet Take 1 mg by mouth 3 (three) times daily.   . Continuous Blood Gluc Receiver (DEXCOM G6 RECEIVER) DEVI Use to check blood sugars 4 times daily (Patient not taking: Reported on 09/13/2020)   . Continuous Blood Gluc Sensor (DEXCOM G6 SENSOR) MISC Use to check blood sugars 4 times daily (Patient not taking: Reported on 09/13/2020)   . Continuous Blood Gluc Transmit (DEXCOM G6 TRANSMITTER) MISC Use to check blood sugars 4 times [id number] (Patient not taking: Reported on 09/13/2020)   . Cyanocobalamin (B-12) 500 MCG SUBL One SL QD   . DEXILANT 60 MG capsule Take 1 capsule by mouth daily. (Patient taking differently: Take 60 mg by mouth daily.)   . diclofenac Sodium (VOLTAREN) 1 % GEL APPLY 4 GRAMS TO THE AFFECTED AREA FOUR TIMES DAILY   . docusate sodium (COLACE) 50 MG capsule Take 50 mg by mouth daily.    . fluconazole (DIFLUCAN) 100 MG tablet Take 1 tablet (100 mg total) by mouth daily. Take 1 tablet by mouth now repeat in 5 days   . fluticasone (FLOVENT HFA) 110 MCG/ACT inhaler Inhale 2 puffs into  the lungs every 12 (twelve) hours.   . hydrochlorothiazide (HYDRODIURIL) 12.5 MG tablet TAKE 1 TABLET(12.5 MG) BY MOUTH DAILY   . HYDROmorphone (DILAUDID) 4 MG tablet Take 4 mg by mouth 4 (four) times daily as needed for severe pain.   . hydrOXYzine (ATARAX/VISTARIL) 25 MG tablet Take 25 mg by mouth in the morning, at noon, in the evening, and at bedtime.   . insulin degludec (TRESIBA FLEXTOUCH) 100 UNIT/ML FlexTouch Pen Inject 10 Units into the skin daily. Sample   . Insulin Pen Needle (PEN NEEDLES) 32G X 4 MM MISC Use as directed with tresiba   . Lancets (ACCU-CHEK  MULTICLIX) lancets Use to check blood sugars daily E11.69   . lidocaine (XYLOCAINE) 2 % solution Use as directed 15 mLs in the mouth or throat 3 (three) times a week.    . Lidocaine 4 % PTCH Place 1 patch onto the skin in the morning and at bedtime.   [name] LORazepam (ATIVAN) 2 MG tablet Take 2 mg by mouth 4 (four) times daily as needed for anxiety.    [name] losartan (COZAAR) 50 MG tablet Take 1 tablet (50 mg total) by mouth daily.   . Multiple Vitamin (MULTIVITAMIN WITH MINERALS) TABS tablet Take 1 tablet by mouth daily.    [name] NARCAN 4 MG/0.1ML LIQD nasal spray kit Place 1 spray into the nose as directed.   . Norgestimate-Ethinyl Estradiol Triphasic (TRI-ESTARYLLA) 0.18/0.215/0.25 MG-35 MCG tablet Take 1 tablet by mouth daily.   . ondansetron (ZOFRAN) 4 MG tablet Take 4 mg by mouth as needed for nausea.    [name] PAIN MANAGEMENT INTRATHECAL, IT, PUMP 1 each by Intrathecal route continuous. Intrathecal (IT) medication: fentanyl, clonidine, bupivicaine  Fentanyl 214m/bupivicaine150mg/clonidine52mg  51.939mper day, 52m71mer day, 1.6m852m5.4ml/7mn   . polyethylene glycol powder (GLYCOLAX/MIRALAX) 17 GM/SCOOP powder MIX 17 GRAMS IN 8 OUNCES OF LIQUID AND DRINK DAILY FOR BOWELS   . promethazine (PHENERGAN) 25 MG tablet Take 1 tablet (25 mg total) by mouth every 6 (six) hours as needed for nausea or vomiting.   . QUEMarland Kitcheniapine (SEROQUEL XR) 400 MG 24 hr tablet Take 400 mg by mouth 2 (two) times daily.   . QUEtiapine (SEROQUEL) 300 MG tablet Take 300 mg by mouth at bedtime.   . sitaGLIPtin-metformin (JANUMET) 50-500 MG tablet Take 1 tablet by mouth 2 (two) times daily with a meal.   . tiZANidine (ZANAFLEX) 4 MG tablet Take 4 mg by mouth every 4 (four) hours.   . traZODone (DESYREL) 100 MG tablet 5 qhs prescribed by psychiatry (Patient taking differently: Take 100-200 mg by mouth at bedtime. 5 qhs prescribed by psychiatry)   . triamcinolone cream (KENALOG) 0.1 % Apply 1 application topically daily as needed (for  itching).   . TRINTELLIX 20 MG TABS tablet Take 20 mg by mouth daily.   . VRAMarland KitchenLAR 6 MG CAPS Take 6 mg by mouth daily. 06/21/2020: D/C by psychiatrist Lida Shelbie Hutchingpatient   . [DISCONTINUED] sitaGLIPtin (JANUVIA) 25 MG tablet Take 1 tablet (25 mg total) by mouth daily. (Patient not taking: Reported on 09/27/2020)    No facility-administered encounter medications on file as of 09/21/2020.    Patient Active Problem List   Diagnosis Date Noted  . Pain in right knee 11/17/2019  . Pain in left knee 05/06/2019  . Hashimoto's disease 04/05/2019  . Nausea 11/23/2018  . Frequent falls 09/17/2018  . Skin lesion 09/17/2018  . GERD (gastroesophageal reflux disease)   . Chronic midline low back pain  with sciatica 05/27/2018  . Intractable pain 03/20/2018  . Bradycardia   . Chronic pain syndrome 03/18/2018  . 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 Hypokalemia 03/18/2018  . Intractable back pain 01/03/2018  . HTN (hypertension) 01/03/2018  . Type 2 diabetes mellitus without complication ([place]) 67/54/4920  . Vitamin D deficiency 07/11/2017  . Postmenopausal 04/24/2017  . Microalbuminuria 01/14/2017  . Dyspareunia in female 12/10/2016  . Endometriosis determined by laparoscopy 12/10/2016  . History of exploratory laparotomy 12/10/2016  . Pelvic pain 12/10/2016  . Bipolar 1 disorder ([place]) 09/19/2016  . Cyst of right ovary 09/19/2016  . Severe episode of recurrent major depressive disorder, with psychotic features ([place]) 09/19/2016  . Schizoaffective disorder, bipolar type ([place]) 09/19/2016  . Chronic pain 03/05/2016  . Non morbid obesity due to excess calories 03/05/2016  . S/P lumbar fusion 03/05/2016    Conditions to be addressed/monitored:Type 2 diabetes Mellitus, Essential hypertension, Chronic pain syndrome, Hashimoto's disease, Lumbar radiculopathy  Care Plan : Chronic Pain (Adult)  Updates made by Lynne Quast, RN since 09/27/2020 12:00 AM    Problem: Pain Management Plan (Chronic  Pain)   Priority: High    Long-Range Goal: Pain Management Plan Developed   Start Date: 09/21/2020  Expected End Date: 03/26/2021  This Visit's Progress: On track  Priority: High  Note:   CARE PLAN ENTRY (see longitudinal plan of care for additional care plan information)  Current Barriers:  [name] Knowledge Deficits related to disease process and Self Health management of Chronic Pain  . Chronic Disease Management support and education needs related to Type 2 diabetes Mellitus, Essential hypertension, Chronic pain syndrome, Hashimoto's disease, Lumbar radiculopathy . Lacks caregiver support . Transportation barriers . Lacks social connections . Unable to perform ADLs independently . Unable to perform IADLs independently Nurse Case Manager Clinical Goal(s):  [name] Over the next 180 days, patient will work with the CCM team and PCP to address needs related to disease education and support for improved Self Health management of Chronic Pain Syndrome  CCM RN CM Interventions:  09/21/20 call completed with patient  . Inter-disciplinary care team collaboration (see longitudinal plan of care) . Evaluation of current treatment plan related to Chronic Pain Syndrome and patient's adherence to plan as established by provider . Determined patient continues to have her Chronic pain managed by Dr. Cherly Anderson with [facility] with her next pump refill scheduled on 07/28/20 . Determined patient was scheduled for a pump replacement on 09/08/20, however, this procedure was cancelled due to patient's A1c is up to 12.3; plans are to rescheduled once A1c is down to 8.5-9.0 . Reviewed medications with patient and discussed patient's current intrathecal pain regimen is as follows:   o Iohexol (OMNIPAQUE) 180 mg/mL injection o 1 o 05/26/2020 o   o  lidocaine 1% (PF) 10 mg/mL injection o 1 o 05/26/2020 o   o  promethazine (PHENERGAN) injection 25 mg o 1 o 05/26/2020 o    . Discussed plans with patient for ongoing care  management follow up and provided patient with direct contact information for care management team Patient Self Care Activities:  . - learn how to meditate . - learn relaxation techniques . - practice acceptance of chronic pain . - practice relaxation or meditation daily . - spend time with positive people . - tell myself I can (not I can't) . - use distraction techniques . - use relaxation during pain . - keep track of prescription refills . - plan exercise or  activity when pain is best controlled . - prioritize tasks for the day . - track times pain is worst and when it is best . - track what makes the pain worse and what makes it better . - work slower and less intense when having pain Next Follow Up Date: 10/26/20   Care Plan : Diabetes Type 2 (Adult)  Updates made by Lynne Kever, RN since 09/27/2020 12:00 AM    Problem: Glycemic Management (Diabetes, Type 2)   Priority: High    Long-Range Goal: Glycemic Management Optimized   Start Date: 09/21/2020  Expected End Date: 03/26/2021  This Visit's Progress: On track  Priority: High  Note:   Objective:  Lab Results  Component Value Date   HGBA1C 12.3 (H) 08/24/2020 .   Lab Results  Component Value Date   CREATININE 1.19 (H) 08/24/2020   CREATININE 0.80 10/06/2019   CREATININE 0.90 10/06/2019 .   [name] No results found for: EGFR Current Barriers:  [name] Knowledge Deficits related to basic Diabetes pathophysiology and self care/management . Knowledge Deficits related to medications used for management of diabetes . Limited Social Support . Lacks social connections . Unable to perform ADLs independently . Unable to perform IADLs independently Case Manager Clinical Goal(s):  . patient will demonstrate improved adherence to prescribed treatment plan for diabetes self care/management as evidenced by: daily monitoring and recording of CBG  adherence to ADA/ carb modified diet adherence to prescribed medication regimen contacting  provider for new or worsened symptoms or questions Interventions:  09/21/20 call completed with patient  . Collaboration with [name], FNP regarding development and update of comprehensive plan of care as evidenced by provider attestation and co-signature . Inter-disciplinary care team collaboration (see longitudinal plan of care) . Provided education to patient about basic DM disease process; Educated on dietary and exercise recommendations . Review of patient status, including review of consultants reports, relevant laboratory and other test results, and medications completed. . Educated on daily glycemic control FBS 80-130, <180 meals  . Reviewed medications with patient and discussed importance of medication adherence . Provided patient with written educational materials related to hypo and hyperglycemia and importance of correct treatment; Diabetes Management using the Plate Method; Grocery Shopping with Diabetes; Preventing Complications with Diabetes; Diabetes Care Schedule  . Advised patient, providing education and rationale, to check cbg before meals and at bedtime and record, calling the CCM team and or PCP for findings outside established parameters.   . Reviewed scheduled/upcoming provider appointments including: PCP OV scheduled for 10/25/20 @11 :00 AM . Discussed plans with patient for ongoing care management follow up and provided patient with direct contact information for care management team Self-Care Activities Self administers oral medications as prescribed Attends all scheduled provider appointments Checks blood sugars as prescribed and utilize hyper and hypoglycemia protocol as needed Adheres to prescribed ADA/carb modified Patient Goals: - to lower A1c Follow Up Plan: Telephone follow up appointment with care management team member scheduled for: 10/26/20    Plan:Telephone follow up appointment with care management team member scheduled for:  10/26/20  Barb Merino, RN,  BSN, CCM Care Management Coordinator [place] [facility]  Direct Phone: [phone]

## 2020-09-27 NOTE — Progress Notes (Signed)
Chronic Care Management Pharmacy Note  09/27/2020 Name:  Linda Mercado MRN:  [id number] DOB:  02/20/1966  Subjective: Linda Mercado is an 55 y.o. year old female who is a primary patient of [name], [place].  The CCM team was consulted for assistance with disease management and care coordination needs.    Engaged with patient by telephone for follow up visit in response to provider referral for pharmacy case management and/or care coordination services.   Consent to Services:  The patient was given information about Chronic Care Management services, agreed to services, and gave verbal consent prior to initiation of services.  Please see initial visit note for detailed documentation.   Patient Care Team: [name], FNP as PCP - General ([place]) Rex Kras Claudette Stapler, RN as Case Manager Mayford Knife, [facility] (Pharmacist)  Recent office visits:  08/30/2020 PCP OV  Recent consult visits:  07/24/2020 Pain [facility] visits:  None in the previous 6 months.   Objective:  Lab Results  Component Value Date   CREATININE 1.19 (H) 08/24/2020   BUN 9 08/24/2020   GFRNONAA 52 (L) 08/24/2020   GFRAA 60 08/24/2020   NA 134 08/24/2020   K 4.4 08/24/2020   CALCIUM 9.9 08/24/2020   CO2 25 08/24/2020    Lab Results  Component Value Date/Time   HGBA1C 12.3 (H) 08/24/2020 03:51 PM   HGBA1C 6.7 (H) 10/12/2019 05:03 PM    Last diabetic Eye exam:  Lab Results  Component Value Date/Time   HMDIABEYEEXA No Retinopathy 09/08/2020 12:00 AM    Last diabetic Foot exam: No results found for: HMDIABFOOTEX   Lab Results  Component Value Date   CHOL 120 08/24/2020   HDL 41 08/24/2020   LDLCALC 50 08/24/2020   TRIG 175 (H) 08/24/2020   CHOLHDL 2.9 08/24/2020    Hepatic Function Latest Ref Rng & Units 08/24/2020 09/17/2018 08/13/2018  Total Protein 6.0 - 8.5 g/dL 7.5 7.7 8.2(H)  Albumin 3.8 - 4.9 g/dL 4.5 4.6 4.3  AST 0 - 40 IU/L 14 9 51(H)  ALT 0 - 32 IU/L _0 Alk  Phosphatase 44 - 121 IU/L 140(H) 110 79  Total Bilirubin 0.0 - 1.2 mg/dL 0.3 <0.2 1.1    Lab Results  Component Value Date/Time   TSH 4.920 (H) 08/24/2020 03:51 PM   TSH 1.660 02/18/2019 12:47 PM   FREET4 1.16 02/23/2019 03:58 PM    CBC Latest Ref Rng & Units 10/06/2019 10/06/2019 10/06/2019  WBC 4.0 - 10.5 K/uL - 7.2 6.3  Hemoglobin 12.0 - 15.0 g/dL 13.6 13.0 13.5  Hematocrit 36.0 - 46.0 % 40.0 39.5 42.0  Platelets 150 - 400 K/uL - 332 367    Lab Results  Component Value Date/Time   VD25OH 51.6 08/24/2020 03:51 PM   VD25OH 142.0 (H) 10/12/2019 05:03 PM    Clinical ASCVD: No  The ASCVD Risk score Mikey Bussing DC Jr., et al., 2013) failed to calculate for the following reasons:   The valid total cholesterol range is 130 to 320 mg/dL    Depression screen [facility] 2/9 09/13/2020 03/25/2019 02/18/2019  Decreased Interest _1 Down, Depressed, Hopeless _2 PHQ - 2 Score _3 Altered sleeping _4 Tired, decreased energy 0 2 3  Change in appetite _5 Feeling bad or failure about yourself  0 2 2  Trouble concentrating _6 Moving slowly or fidgety/restless 0 2 2  Suicidal thoughts 0 0 -  PHQ-9 Score _0 Difficult doing work/chores Somewhat difficult Very difficult -     Social History   Tobacco Use  Smoking Status Never Smoker  Smokeless Tobacco Never Used   BP Readings from Last 3 Encounters:  09/13/20 (!) 142/80  08/30/20 124/80  05/22/20 120/76   Pulse Readings from Last 3 Encounters:  09/13/20 80  08/30/20 76  05/22/20 68   Wt Readings from Last 3 Encounters:  09/13/20 164 lb (74.4 kg)  08/30/20 160 lb 3.2 oz (72.7 kg)  05/22/20 169 lb 12.8 oz (77 kg)    Assessment/Interventions: Review of patient past medical history, allergies, medications, health status, including review of consultants reports, laboratory and other test data, was performed as part of comprehensive evaluation and provision of chronic care management services.   SDOH:  (Social  Determinants of Health) assessments and interventions performed: No   CCM Care Plan  Allergies  Allergen Reactions  . Lisinopril Hives    Other reaction(s): anaphylaxis/angioedema  . Latex Hives  . Morphine And Related     Itching, skin get hot.   . Adhesive [Tape] Rash    Medications Reviewed Today    Reviewed by Mayford Knife, RPH (Pharmacist) on 09/27/20 at 1107  Med List Status: <None>  Medication Order Taking? Sig Documenting Provider Last Dose Status Informant  ACCU-CHEK GUIDE test strip [id number] Yes TEST TWICE DAILY AS DIRECTED [name], FNP Taking Active   albuterol (VENTOLIN HFA) 108 (90 Base) MCG/ACT inhaler [id number] Yes INHALE 2 PUFFS INTO THE LUNGS EVERY 6 HOURS AS NEEDED FOR WHEEZING OR SHORTNESS OF Placido Sou, FNP Taking Active   AMITIZA 24 MCG capsule [id number] Yes Take 24 mcg by mouth 2 (two) times daily.  [provider] Taking Active Multiple Informants  atenolol (TENORMIN) 100 MG tablet [id number] Yes Take 1 tablet (100 mg total) by mouth daily. [name], FNP Taking Active   atorvastatin (LIPITOR) 10 MG tablet [id number] Yes Take 1 tablet (10 mg total) by mouth at bedtime. [name], FNP Taking Active   benzonatate (TESSALON PERLES) 100 MG capsule [id number] Yes Take 1 capsule (100 mg total) by mouth every 6 (six) hours as needed. [name], FNP Taking Active   Blood Glucose Monitoring Suppl (ACCU-CHEK GUIDE) w/Device KIT [id number] Yes USE TO CHECK BLOOD SUGAR TWICE DAILY [name], FNP Taking Active   calcium gluconate 500 MG tablet [id number] Yes Take 1 tablet (500 mg total) by mouth 3 (three) times daily. [name], FNP Taking Active   CAPLYTA 42 MG CAPS [id number] Yes Take 42 mg by mouth at bedtime.  [provider] Taking Active Multiple Informants  clonazePAM (KLONOPIN) 1 MG tablet [id number] Yes Take 1 mg by mouth 3 (three) times daily. [provider] Taking Active Multiple Informants  Continuous  Blood Gluc Receiver (DEXCOM G6 RECEIVER) DEVI [id number]  Use to check blood sugars 4 times daily  Patient not taking: Reported on 09/13/2020   [name], FNP  Active   Continuous Blood Gluc Sensor (DEXCOM G6 SENSOR) MISC [id number]  Use to check blood sugars 4 times daily  Patient not taking: Reported on 09/13/2020   [name], FNP  Active   Continuous Blood Gluc Transmit (DEXCOM G6 TRANSMITTER) MISC [id number]  Use to check blood sugars 4 times [id number]  Patient not taking: Reported on 09/13/2020   [name], FNP  Active   Cyanocobalamin (B-12) 500 MCG SUBL [id number]  One SL QD [name],  FNP  Active   DEXILANT 60 MG capsule [id number] Yes Take 1 capsule by mouth daily.  Patient taking differently: Take 60 mg by mouth daily.   [name], FNP Taking Active   diclofenac Sodium (VOLTAREN) 1 % GEL [id number] Yes APPLY 4 GRAMS TO THE AFFECTED AREA FOUR TIMES DAILY [name], FNP Taking Active   docusate sodium (COLACE) 50 MG capsule [id number] Yes Take 50 mg by mouth daily.  [provider] Taking Active Multiple Informants  fluconazole (DIFLUCAN) 100 MG tablet [id number] Yes Take 1 tablet (100 mg total) by mouth daily. Take 1 tablet by mouth now repeat in 5 days [name], FNP Taking Active   fluticasone (FLOVENT HFA) 110 MCG/ACT inhaler [id number] Yes Inhale 2 puffs into the lungs every 12 (twelve) hours. [name], FNP Taking Active   hydrochlorothiazide (HYDRODIURIL) 12.5 MG tablet [id number] Yes TAKE 1 TABLET(12.5 MG) BY MOUTH DAILY [name], FNP Taking Active   HYDROmorphone (DILAUDID) 4 MG tablet [id number] Yes Take 4 mg by mouth 4 (four) times daily as needed for severe pain. [provider] Taking Active Multiple Informants  hydrOXYzine (ATARAX/VISTARIL) 25 MG tablet [id number] Yes Take 25 mg by mouth in the morning, at noon, in the evening, and at bedtime. [provider] Taking Active Multiple Informants  insulin degludec (TRESIBA  FLEXTOUCH) 100 UNIT/ML FlexTouch Pen [id number] Yes Inject 10 Units into the skin daily. Sample [name], FNP Taking Active   Insulin Pen Needle (PEN NEEDLES) 32G X 4 MM MISC [id number] Yes Use as directed with Rudean Curt, FNP Taking Active   Lancets (ACCU-CHEK MULTICLIX) lancets [id number] Yes Use to check blood sugars daily E11.69 [name], FNP Taking Active   lidocaine (XYLOCAINE) 2 % solution [id number] Yes Use as directed 15 mLs in the mouth or throat 3 (three) times a week.  [provider] Taking Active Multiple Informants  Lidocaine 4 % PTCH [id number] Yes Place 1 patch onto the skin in the morning and at bedtime. [name], FNP Taking Active   LORazepam (ATIVAN) 2 MG tablet [id number] Yes Take 2 mg by mouth 4 (four) times daily as needed for anxiety.  [provider] Taking Active   losartan (COZAAR) 50 MG tablet [id number] Yes Take 1 tablet (50 mg total) by mouth daily. [name], FNP Taking Active   Multiple Vitamin (MULTIVITAMIN WITH MINERALS) TABS tablet [id number] Yes Take 1 tablet by mouth daily.  [provider] Taking Active Multiple Informants  NARCAN 4 MG/0.1ML LIQD nasal spray kit [id number] Yes Place 1 spray into the nose as directed. [provider] Taking Active Multiple Informants  Norgestimate-Ethinyl Estradiol Triphasic (TRI-ESTARYLLA) 0.18/0.215/0.25 MG-35 MCG tablet [id number] Yes Take 1 tablet by mouth daily. [name], FNP Taking Active   ondansetron [facility]) 4 MG tablet [id number] Yes Take 4 mg by mouth as needed for nausea.  [provider] Taking Active Multiple Informants  PAIN MANAGEMENT INTRATHECAL, IT, PUMP [id number] Yes 1 each by Intrathecal route continuous. Intrathecal (IT) medication: fentanyl, clonidine, bupivicaine  Fentanyl 2333m/bupivicaine150mg/clonidine33mg  51.9133mper day, 33m66mer day, 1.79m64m5.4ml/71mn [provider] Taking Active Multiple Informants  polyethylene glycol  powder (GLYCOLAX/MIRALAX) 17 GM/SCOOP powder [id number] 17 GRAMS IN 8 OUNCES OF LIQUID AND DRINK DAILY FOR BOWELS Rodriguez-Southworth, SylviSunday SpillersC Taking Active   promethazine (PHENERGAN) 25 MG tablet [id number] 1 tablet (25 mg total) by mouth every 6 (six) hours as needed for nausea or vomiting. MooreMinette Brine Taking Active   QUEtiapine (SEROQUEL  XR) 400 MG 24 hr tablet [id number] Yes Take 400 mg by mouth 2 (two) times daily. [provider] Taking Active Multiple Informants           Med Note Duffy Bruce, Legrand Como   Wed Mar 18, 2018  7:28 PM)    QUEtiapine (SEROQUEL) 300 MG tablet [id number] Yes Take 300 mg by mouth at bedtime. [provider] Taking Active Multiple Informants  sitaGLIPtin (JANUVIA) 25 MG tablet [id number] Yes Take 1 tablet (25 mg total) by mouth daily. [name], FNP Taking Active   sitaGLIPtin-metformin (JANUMET) 50-500 MG tablet [id number] Yes Take 1 tablet by mouth 2 (two) times daily with a meal. [name], FNP Taking Active   tiZANidine (ZANAFLEX) 4 MG tablet [id number] Yes Take 4 mg by mouth every 4 (four) hours. [provider] Taking Active Multiple Informants  traZODone (DESYREL) 100 MG tablet [id number] Yes 5 qhs prescribed by psychiatry  Patient taking differently: Take 100-200 mg by mouth at bedtime. 5 qhs prescribed by psychiatry   Rodriguez-Southworth, [name], PA-C Taking Active Multiple Informants  triamcinolone cream (KENALOG) 0.1 % [id number] Yes Apply 1 application topically daily as needed (for itching). Rodriguez-Southworth, [name], PA-C Taking Active Multiple Informants  TRINTELLIX 20 MG TABS tablet [id number] Yes Take 20 mg by mouth daily. [provider] Taking Active   VRAYLAR 6 MG CAPS [id number]  Take 6 mg by mouth daily. [provider]  Active            Med Note Mayford Knife   Wed Jun 21, 2020  1:55 PM) D/C by psychiatrist Shelbie Hutching per patient           Patient Active Problem List    Diagnosis Date Noted  . Pain in right knee 11/17/2019  . Pain in left knee 05/06/2019  . Hashimoto's disease 04/05/2019  . Nausea 11/23/2018  . Frequent falls 09/17/2018  . Skin lesion 09/17/2018  . GERD (gastroesophageal reflux disease)   . Chronic midline low back pain with sciatica 05/27/2018  . Intractable pain 03/20/2018  . Bradycardia   . Chronic pain syndrome 03/18/2018  . 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 Hypokalemia 03/18/2018  . Intractable back pain 01/03/2018  . HTN (hypertension) 01/03/2018  . Type 2 diabetes mellitus without complication ([place]) 33/43/5686  . Vitamin D deficiency 07/11/2017  . Postmenopausal 04/24/2017  . Microalbuminuria 01/14/2017  . Dyspareunia in female 12/10/2016  . Endometriosis determined by laparoscopy 12/10/2016  . History of exploratory laparotomy 12/10/2016  . Pelvic pain 12/10/2016  . Bipolar 1 disorder ([place]) 09/19/2016  . Cyst of right ovary 09/19/2016  . Severe episode of recurrent major depressive disorder, with psychotic features ([place]) 09/19/2016  . Schizoaffective disorder, bipolar type ([place]) 09/19/2016  . Chronic pain 03/05/2016  . Non morbid obesity due to excess calories 03/05/2016  . S/P lumbar fusion 03/05/2016    Immunization History  Administered Date(s) Administered  . Influenza Inj Mdck Quad With Preservative 05/11/2018  . Influenza,inj,Quad PF,6+ Mos 09/19/2016, 04/24/2017  . PFIZER(Purple Top)SARS-COV-2 Vaccination 06/05/2020, 06/26/2020    Conditions to be addressed/monitored:  Hypertension and Diabetes  Patient Care Plan: CCM Pharmacy Care Plan    Problem Identified: HTN, HLD, DM   Priority: High    Long-Range Goal: Disease Management   This Visit's Progress: On track  Priority: High  Note:   Current Barriers:  . Unable to independently afford treatment regimen . Unable to achieve control of glucose levels.    Pharmacist Clinical Goal(s):  .  Over the next 30 days, patient will achieve  adherence to monitoring guidelines and medication adherence to achieve therapeutic efficacy through collaboration with PharmD and provider.   Interventions: . 1:1 collaboration with [name], FNP regarding development and update of comprehensive plan of care as evidenced by provider attestation and co-signature . Inter-disciplinary care team collaboration (see longitudinal plan of care) . Comprehensive medication review performed; medication list updated in electronic medical record  Hypertension (BP goal <130/80) -controlled -Current treatment: . Losartan 50 mg tablet daily  . Atenolol 100 mg take daily   . Hydrochlorothiazide 12.5 mg tablet daily  -Medications previously tried: losartan 25 mg,  -Current home readings: 124/71, 130/80 -Current dietary habits: patient is avoiding fried and fatty foods and she is using an air fryer, she also uses an indoor grill for her meats  -Current exercise habits: none at this time patient reports that she might try to do chair exercises.  -Denies hypotensive/hypertensive symptoms -Educated on BP goals and benefits of medications for prevention of heart attack, stroke and kidney damage; Daily salt intake goal < 2300 mg; Importance of home blood pressure monitoring; Proper BP monitoring technique; -Counseled to monitor BP at home once per week, document, and provide log at future appointments -Recommended to continue current medication  Hyperlipidemia: (LDL goal < 70) -controlled -Current treatment: . Atorvastatin 10 mg tablet take daily  -Current exercise habits: she parks far from where she is going and uses her cane or walker, and she also does a lot around the house for the twins.  -Educated on Cholesterol goals;  Benefits of statin for ASCVD risk reduction; Importance of limiting foods high in cholesterol; Exercise goal of 150 minutes per week; -Counseled on diet and exercise extensively Recommended to continue current  medication  Diabetes (A1c goal <7%) -uncontrolled -Current medications: . Tyler Aas - Inject 10 units once per day  . Janumet 50 - 500 mg - taking 1 tablet by mouth twice daily  -Medications previously tried:  -Current home glucose readings . fasting glucose:  90-100 . post prandial glucose: 120-160 -Denies hypo or hyperglycemia symptoms  . -Current meal patterns: . Eating more salads : chicken, tuna fish and tomato soup  . Snacks: eating more crackers, but she is going to decrease the amount of crackers that she is eating . drinks: increased water and no longer drinking juice  -Current exercise: none at this time  -Educated onA1c and blood sugar goals; Complications of diabetes including kidney damage, retinal damage, and cardiovascular disease; Benefits of weight loss; Proper insulin injection technique; Benefits of routine self-monitoring of blood sugar; -Counseled to check feet daily and get yearly eye exams -Counseled on diet and exercise extensively Recommended to continue current medication   Patient Goals/Self-Care Activities . Over the next 30 days, patient will:  - take medications as prescribed  Follow Up Plan: Telephone follow up appointment with care management team member scheduled for: 11/02/2020      Medication Assistance: None required.  Patient affirms current coverage meets needs.  Patient's preferred pharmacy is:  [id number] - [address], [address] [place] [address] Phone: [phone] Fax: [phone]  Uses pill box? No - patient needs to get another pill box, she reports that everything starts fading from the top of the bottle.  Pt endorses 90% compliance  We discussed: Benefits of medication synchronization, packaging and delivery as well as enhanced pharmacist oversight  with [facility]. Patient decided to: Utilize [facility] pharmacy for medication  synchronization, packaging and delivery  Care Plan and Follow Up Patient Decision:  Patient agrees to Care Plan and Follow-up.  Plan: Telephone follow up appointment with care management team member scheduled for:  09/27/2020 and The patient has been provided with contact information for the care management team and has been advised to call with any health related questions or concerns.   Orlando Penner, PharmD Clinical Pharmacist [facility] [phone]

## 2020-09-27 NOTE — Patient Instructions (Signed)
Goals Addressed      Other   .  Cope with Chronic Pain   On track     Timeframe:  Long-Range Goal Priority:  Medium Start Date: 09/21/20                            Expected End Date: 03/26/21                     Follow Up Date: 10/26/20   - learn how to meditate - learn relaxation techniques - practice acceptance of chronic pain - practice relaxation or meditation daily - spend time with positive people - tell myself I can (not I can't) - use distraction techniques - use relaxation during pain    Why is this important?    Stress makes chronic pain feel worse.   Feelings like depression, anxiety, stress and anger can make your body more sensitive to pain.   Learning ways to cope with stress or depression may help you find some relief from the pain.     Notes:     [name]  Glycemic Management Optimized   On track     Timeframe:  Long-Range Goal Priority:  High Start Date: 09/21/20                            Expected End Date: 03/26/21  Next Follow Up Call: 10/26/20  - Self administers oral medications as prescribed - Attends all scheduled provider appointments - Checks blood sugars as prescribed and utilize hyper and hypoglycemia protocol as needed - Adheres to prescribed ADA/carb modified                         .  Manage Chronic Pain   On track     Timeframe:  Long-Range Goal Priority:  High Start Date: 09/21/20                            Expected End Date: 03/26/21                     Follow Up Date: 10/26/20   - keep track of prescription refills - plan exercise or activity when pain is best controlled - prioritize tasks for the day - track times pain is worst and when it is best - track what makes the pain worse and what makes it better - work slower and less intense when having pain    Why is this important?    Day-to-day life can be hard when you have chronic pain.   Pain medicine is just one piece of the treatment puzzle.   You can try these action steps to help you  manage your pain.    Notes:

## 2020-09-27 NOTE — Patient Instructions (Signed)
Visit Information It was great speaking with you today!  Please let me know if you have any questions about our visit. Patient Care Plan: CCM Pharmacy Care Plan    Problem Identified: HTN, HLD, DM   Priority: High    Long-Range Goal: Disease Management   This Visit's Progress: On track  Priority: High  Note:   Current Barriers:  . Unable to independently afford treatment regimen . Unable to achieve control of glucose levels.    Pharmacist Clinical Goal(s):  [name] Over the next 30 days, patient will achieve adherence to monitoring guidelines and medication adherence to achieve therapeutic efficacy through collaboration with PharmD and provider.   Interventions: . 1:1 collaboration with [name], FNP regarding development and update of comprehensive plan of care as evidenced by provider attestation and co-signature . Inter-disciplinary care team collaboration (see longitudinal plan of care) . Comprehensive medication review performed; medication list updated in electronic medical record  Hypertension (BP goal <130/80) -controlled -Current treatment: . Losartan 50 mg tablet daily  . Atenolol 100 mg take daily   . Hydrochlorothiazide 12.5 mg tablet daily  -Medications previously tried: losartan 25 mg,  -Current home readings: 126/80, 121/70 -Current dietary habits: patient is avoiding fried and fatty foods and she is using an air fryer, she also uses an indoor grill for her meats  -Current exercise habits: none at this time  -Denies hypotensive/hypertensive symptoms -Educated on BP goals and benefits of medications for prevention of heart attack, stroke and kidney damage; Daily salt intake goal < 2300 mg; Exercise goal of 150 minutes per week; Importance of home blood pressure monitoring; Proper BP monitoring technique; -Counseled to monitor BP at home once per week, document, and provide log at future appointments -Recommended to continue current medication  Hyperlipidemia:  (LDL goal < 70) -controlled -Current treatment: . Atorvastatin 10 mg tablet take daily  -Current exercise habits: she parks far from where she is going and uses her cane or walker, and she also does a lot around the house for the twins.  -Educated on Cholesterol goals;  Benefits of statin for ASCVD risk reduction; Importance of limiting foods high in cholesterol; Exercise goal of 150 minutes per week; -Counseled on diet and exercise extensively Recommended to continue current medication  Diabetes (A1c goal <7%) -uncontrolled -Current medications: . Tyler Aas - Inject 10 units once per day  . Janumet 50 - 500 mg - taking 1 tablet by mouth twice daily  -Medications previously tried:  -Current home glucose readings -Patient was supposed to have esophagus stretched but her blood sugar was 345, she went to the office and her blood work showing her A1c was extremely high - Now checking her BS twice per day  . fasting glucose:  345, 260, 152   . post prandial glucose: she will check before dinner  -Reports hypoglycemic/hyperglycemic symptoms  -Was nauseous  -Current meal patterns: patient reports that she has been having a lot of sugar cravings  . Has been eating a lot of sugary stuff she did not discuss in detail  . drinks: 2-3 gallons of orange juice in 4 days to a week. She recently stopped drinking juice, now she is is drinking water  -Current exercise: none at this time  -Educated onA1c and blood sugar goals; Complications of diabetes including kidney damage, retinal damage, and cardiovascular disease; Benefits of weight loss; Proper insulin injection technique; Benefits of routine self-monitoring of blood sugar; -Counseled to check feet daily and get yearly eye exams -Counseled on  diet and exercise extensively Recommended to continue current medication Patients current dose of Janumet should be increased to Janumet 50-1000 mg take 1 tablet by mouth twice per day   Patient  Goals/Self-Care Activities . Over the next 30 days, patient will:  - take medications as prescribed  Follow Up Plan: Telephone follow up appointment with care management team member scheduled for: 09/27/2020    Patient agreed to services and verbal consent obtained.   The patient verbalized understanding of instructions, educational materials, and care plan provided today and agreed to receive a mailed copy of patient instructions, educational materials, and care plan.   Orlando Penner, PharmD Clinical Pharmacist [facility] [phone]

## 2020-09-27 NOTE — Telephone Encounter (Signed)
   Telephone encounter was:  Successful.  09/27/2020 Name: Linda Mercado MRN: [id number] DOB: 29-Aug-1965  Linda Mercado is a 55 y.o. year old female who is a primary care patient of [name], [place] . The community resource team was consulted for assistance with [place] guide performed the following interventions: Patient provided with information about care guide support team and interviewed to confirm resource needs Discussed resources to assist with food deivery and extra food supplies.  Follow Up Plan:  Care guide will follow up with patient by phone over the next few days  [place], Care Management  [phone] [address] , [place] [address] Email : [email or web address] [email or web address]. [email or web address]

## 2020-10-02 ENCOUNTER — Telehealth: Payer: Self-pay | Admitting: *Deleted

## 2020-10-02 NOTE — Telephone Encounter (Signed)
   Telephone encounter was:  Unsuccessful.  10/02/2020 Name: Linda Mercado MRN: [id number] DOB: [birth year]  Unsuccessful outbound call made today to assist with:  Food Insecurity    Outreach Attempt:  1st Attempt  A HIPAA compliant voice message was left requesting a return call.  Instructed patient to call back at [place], Care Management  [phone] [address] , [place] [address] Email : [email or web address]

## 2020-10-03 ENCOUNTER — Telehealth: Payer: Self-pay | Admitting: *Deleted

## 2020-10-03 ENCOUNTER — Other Ambulatory Visit: Payer: Self-pay | Admitting: Nurse Practitioner

## 2020-10-03 DIAGNOSIS — E119 Type 2 diabetes mellitus without complications: Secondary | ICD-10-CM

## 2020-10-03 NOTE — Telephone Encounter (Signed)
   Telephone encounter was:  Unsuccessful.  10/03/2020 Name: Linda Mercado MRN: [id number] DOB: Jul 29, 1965  Unsuccessful outbound call made today to assist with:  Food Insecurity  Outreach Attempt:  2nd Attempt  A HIPAA compliant voice message was left requesting a return call.  Instructed patient to call back at her [place], Care Management  [phone] [address] , [place] [address] Email : [email or web address] [email or web address]. [email or web address]

## 2020-10-09 ENCOUNTER — Other Ambulatory Visit: Payer: Self-pay

## 2020-10-09 ENCOUNTER — Other Ambulatory Visit: Payer: Medicare Other | Admitting: Hospice

## 2020-10-09 DIAGNOSIS — K5900 Constipation, unspecified: Secondary | ICD-10-CM

## 2020-10-09 DIAGNOSIS — Z515 Encounter for palliative care: Secondary | ICD-10-CM

## 2020-10-09 DIAGNOSIS — G894 Chronic pain syndrome: Secondary | ICD-10-CM

## 2020-10-09 NOTE — Progress Notes (Signed)
Linda Mercado Consult Note Telephone: [phone]  Fax: [phone]  PATIENT NAME: Linda Mercado DOB: [birth year] MRN: [id number]  PRIMARY CARE PROVIDER:   [name], [place], [place], [place],  [place] [address]  REFERRING PROVIDER: [name], [place], [place], [place] [address] [place],  [place] [address]   RESPONSIBLE PARTY:Self [phone] [place] [address] Prefers to be called [place] with God daughter - [phone] Twin children live at home  TELEHEALTH VISIT STATEMENT Due to the COVID-19 crisis, this visit was done via telemedicine and it was initiated and consent by this patient and or family. Video-audio (telehealth) contact was unable to be done due to technical barriers from the patient's side.   Visit is to build trust and highlight Palliative Medicine as specialized medical care for people living with serious illness, aimed at facilitating better quality of life through symptoms relief, assisting with advance care plan and establishing goals of care.   CHIEF COMPLAINT: Palliative focused visit/generalized body pain  RECOMMENDATIONS/PLAN:   CODE STATUS:  Patient affirmed she remains a full code.   GOALS OF CARE: Goals of care include to maximize quality of life and symptom management.  Visit consisted of counseling and education dealing with the complex and emotionally intense issues of symptom management and palliative care in the setting of serious and potentially life-threatening illness. Palliative care team will continue to support patient, patient's family, and medical team.  I spent 16  minutes providing this consultation. More than 50% of the time in this consultation was spent on coordinating communication.   -------------------------------------------------------------------------------------------------------------------------------------------------- 3. Symptom management/Plan:  Generalized body pain related to chronic pain syndrome.  Continue intrathecal pain pump; continue appointments with [facility].   She denied constipation; well managed with Amitza and Miralax.  Palliative will continue to monitor for symptom management/decline and make recommendations as needed. Return 2 months or prn. Encouraged provider sooner with any concerns.   HISTORY OF PRESENT ILLNESS:  Linda Mercado is a 55 y.o. female with multiple medical problems including generalized body pain, aching, chronic with frequent exacerbation, worsened with activity, impacting quality of life and independence.  Patient reports she attends [facility] and has appointment November 01, 2020.  She reported pain has been going on since 2008 following an accident where she fell from a 36 feet ladder with subsequent surgeries.  She reported pain gets up to 10 out of 10 but currently a 2 out of 10 on a pain scale 0 no pain and 10 for worst pain ever.  She said she just woke up from sleep and that sleeping helps as well as Intrathecal pain pump.  History of Schizoaffective disorder, Bipolar 1, Type 2 DM HTN , chronic pain syndrome, Endometriosis determined by laparoscopy. Palliative Care was asked to help address goals of care.  History obtained from review of EMR, discussion with patient/family.   Review and summarization of Epic records shows history from other than patient. Rest of 10 point ROS asked and negative.  Palliative Care was asked to follow this patient by consultation request of [name], FNP to help address complex decision making in the context of advance care planning and goals of care clarification.   CODE STATUS: Full  PPS: 50%  HOSPICE ELIGIBILITY/DIAGNOSIS: TBD  PAST MEDICAL  HISTORY:  Past Medical History:  Diagnosis Date  . Acid reflux   .  Bradycardia   . Chronic back pain   . Diabetes mellitus without complication ([place])   . Hypertension   . Hypokalemia     SOCIAL HX: _0  Patient at home for ongoing care   FAMILY HX:  Family History  Problem Relation Age of Onset  . Diabetes Father   . Hypertension Father   . Kidney disease Mother   . Hypertension Mother   . Bipolar disorder Sister   . Schizophrenia Sister   . Neuropathy Sister   . Hypertension Sister   . Bipolar disorder Brother   . Bipolar disorder Brother   . Schizophrenia Brother   . Bipolar disorder Daughter   . Post-traumatic stress disorder Daughter   . Asthma Daughter   . Depression Daughter   . Bipolar disorder Daughter   . Post-traumatic stress disorder Daughter   . Asthma Daughter   . Depression Daughter   . Asthma Daughter   . Depression Daughter   . Asthma Daughter   . Stroke Neg Hx   . Cancer Neg Hx   . CAD Neg Hx     Review lab tests/diagnostics No results for input(s): WBC, HGB, HCT, PLT, MCV in the last 168 hours. No results for input(s): NA, K, CL, CO2, BUN, CREATININE, GLUCOSE in the last 168 hours. Latest GFR by Cockcroft Gault (not valid in AKI or ESRD) CrCl cannot be calculated (Patient's most recent lab result is older than the maximum 21 days allowed.). No results for input(s): AST, ALT, ALKPHOS, GGT in the last 168 hours.  Invalid input(s): TBILI, CONJBILI, ALB, TOTALPROTEIN No components found for: ALB No results for input(s): APTT, INR in the last 168 hours.  Invalid input(s): PTPATIENT No results for input(s): BNP, PROBNP in the last 168 hours.  ALLERGIES:  Allergies  Allergen Reactions  . Lisinopril Hives    Other reaction(s): anaphylaxis/angioedema  . Latex Hives  . Morphine And Related     Itching, skin get hot.   . Adhesive [Tape] Rash      PERTINENT MEDICATIONS:  Outpatient Encounter Medications as of 10/09/2020  Medication Sig  .  ACCU-CHEK GUIDE test strip TEST TWICE DAILY AS DIRECTED  . albuterol (VENTOLIN HFA) 108 (90 Base) MCG/ACT inhaler INHALE 2 PUFFS INTO THE LUNGS EVERY 6 HOURS AS NEEDED FOR WHEEZING OR SHORTNESS OF BREATH  . AMITIZA 24 MCG capsule Take 24 mcg by mouth 2 (two) times daily.   [name] atenolol (TENORMIN) 100 MG tablet Take 1 tablet (100 mg total) by mouth daily.  [name] atorvastatin (LIPITOR) 10 MG tablet Take 1 tablet (10 mg total) by mouth at bedtime.  . benzonatate (TESSALON PERLES) 100 MG capsule Take 1 capsule (100 mg total) by mouth every 6 (six) hours as needed.  . Blood Glucose Monitoring Suppl (ACCU-CHEK GUIDE) w/Device KIT USE TO CHECK BLOOD SUGAR TWICE DAILY  . calcium gluconate 500 MG tablet Take 1 tablet (500 mg total) by mouth 3 (three) times daily.  . CAPLYTA 42 MG CAPS Take 42 mg by mouth at bedtime.   . clonazePAM (KLONOPIN) 1 MG tablet Take 1 mg by mouth 3 (three) times daily.  . Continuous Blood Gluc Receiver (DEXCOM G6 RECEIVER) DEVI Use to check blood sugars 4 times daily (Patient not taking: Reported on 09/13/2020)  . Continuous Blood Gluc Sensor (DEXCOM G6 SENSOR) MISC Use to check blood sugars 4 times daily (Patient not taking: Reported on 09/13/2020)  . Continuous Blood Gluc Transmit (DEXCOM G6 TRANSMITTER) MISC Use to check blood sugars 4 times  [id number] (Patient not taking: Reported on 09/13/2020)  . Cyanocobalamin (B-12) 500 MCG SUBL One SL QD  . DEXILANT 60 MG capsule Take 1 capsule by mouth daily. (Patient taking differently: Take 60 mg by mouth daily.)  . diclofenac Sodium (VOLTAREN) 1 % GEL APPLY 4 GRAMS TO THE AFFECTED AREA FOUR TIMES DAILY  . docusate sodium (COLACE) 50 MG capsule Take 50 mg by mouth daily.   . fluconazole (DIFLUCAN) 100 MG tablet Take 1 tablet (100 mg total) by mouth daily. Take 1 tablet by mouth now repeat in 5 days  . fluticasone (FLOVENT HFA) 110 MCG/ACT inhaler Inhale 2 puffs into the lungs every 12 (twelve) hours.  . hydrochlorothiazide (HYDRODIURIL) 12.5 MG  tablet TAKE 1 TABLET(12.5 MG) BY MOUTH DAILY  . HYDROmorphone (DILAUDID) 4 MG tablet Take 4 mg by mouth 4 (four) times daily as needed for severe pain.  . hydrOXYzine (ATARAX/VISTARIL) 25 MG tablet Take 25 mg by mouth in the morning, at noon, in the evening, and at bedtime.  . Insulin Pen Needle (PEN NEEDLES) 32G X 4 MM MISC Use as directed with tresiba  . Lancets (ACCU-CHEK MULTICLIX) lancets Use to check blood sugars daily E11.69  . lidocaine (XYLOCAINE) 2 % solution Use as directed 15 mLs in the mouth or throat 3 (three) times a week.   . Lidocaine 4 % PTCH Place 1 patch onto the skin in the morning and at bedtime.  [name] LORazepam (ATIVAN) 2 MG tablet Take 2 mg by mouth 4 (four) times daily as needed for anxiety.   [name] losartan (COZAAR) 50 MG tablet Take 1 tablet (50 mg total) by mouth daily.  . Multiple Vitamin (MULTIVITAMIN WITH MINERALS) TABS tablet Take 1 tablet by mouth daily.   [name] NARCAN 4 MG/0.1ML LIQD nasal spray kit Place 1 spray into the nose as directed.  . Norgestimate-Ethinyl Estradiol Triphasic (TRI-ESTARYLLA) 0.18/0.215/0.25 MG-35 MCG tablet Take 1 tablet by mouth daily.  . ondansetron (ZOFRAN) 4 MG tablet Take 4 mg by mouth as needed for nausea.   [name] PAIN MANAGEMENT INTRATHECAL, IT, PUMP 1 each by Intrathecal route continuous. Intrathecal (IT) medication: fentanyl, clonidine, bupivicaine  Fentanyl 231m/bupivicaine150mg/clonidine33mg  51.914mper day, 33m49mer day, 1.57m66m5.4ml/933mn  . polyethylene glycol powder (GLYCOLAX/MIRALAX) 17 GM/SCOOP powder MIX 17 GRAMS IN 8 OUNCES OF LIQUID AND DRINK DAILY FOR BOWELS  . promethazine (PHENERGAN) 25 MG tablet Take 1 tablet (25 mg total) by mouth every 6 (six) hours as needed for nausea or vomiting.  . QUEMarland Kitcheniapine (SEROQUEL XR) 400 MG 24 hr tablet Take 400 mg by mouth 2 (two) times daily.  . QUEtiapine (SEROQUEL) 300 MG tablet Take 300 mg by mouth at bedtime.  . sitaGLIPtin-metformin (JANUMET) 50-500 MG tablet Take 1 tablet by mouth 2 (two)  times daily with a meal.  . tiZANidine (ZANAFLEX) 4 MG tablet Take 4 mg by mouth every 4 (four) hours.  . traZODone (DESYREL) 100 MG tablet 5 qhs prescribed by psychiatry (Patient taking differently: Take 100-200 mg by mouth at bedtime. 5 qhs prescribed by psychiatry)  . TRESIBA FLEXTOUCH 100 UNIT/ML FlexTouch Pen INJECT 10 UNITS UNDER THE SKIN DAILY  . triamcinolone cream (KENALOG) 0.1 % Apply 1 application topically daily as needed (for itching).  . TRINTELLIX 20 MG TABS tablet Take 20 mg by mouth daily.  . VRAMarland KitchenLAR 6 MG CAPS Take 6 mg by mouth daily.   No facility-administered encounter medications on file as of 10/09/2020.   Physical exam deferred due to telehealth medicine  Thank  you for the opportunity to participate in the care of LYNNELLE MESMER Please call our office at [phone] if we can be of additional assistance.  Note: Portions of this note were generated with [employer]. Dictation errors may occur despite best attempts at proofreading.  Teodoro Spray, NP

## 2020-10-11 ENCOUNTER — Other Ambulatory Visit: Payer: Self-pay | Admitting: Nurse Practitioner

## 2020-10-11 DIAGNOSIS — E119 Type 2 diabetes mellitus without complications: Secondary | ICD-10-CM

## 2020-10-19 ENCOUNTER — Encounter: Payer: Self-pay | Admitting: Nurse Practitioner

## 2020-10-25 ENCOUNTER — Ambulatory Visit (INDEPENDENT_AMBULATORY_CARE_PROVIDER_SITE_OTHER): Payer: MEDICARE | Admitting: Nurse Practitioner

## 2020-10-25 ENCOUNTER — Other Ambulatory Visit: Payer: Self-pay

## 2020-10-25 ENCOUNTER — Other Ambulatory Visit (HOSPITAL_COMMUNITY)
Admission: RE | Admit: 2020-10-25 | Discharge: 2020-10-25 | Disposition: A | Discharge status: Home / Self Care | Payer: Medicare HMO | Source: Ambulatory Visit | Attending: Nurse Practitioner | Admitting: Nurse Practitioner

## 2020-10-25 ENCOUNTER — Encounter: Payer: Self-pay | Admitting: Nurse Practitioner

## 2020-10-25 VITALS — Temp 98.3°F | Ht 64.2 in | Wt 155.4 lb

## 2020-10-25 DIAGNOSIS — I10 Essential (primary) hypertension: Secondary | ICD-10-CM | POA: Diagnosis not present

## 2020-10-25 DIAGNOSIS — N898 Other specified noninflammatory disorders of vagina: Secondary | ICD-10-CM | POA: Diagnosis present

## 2020-10-25 DIAGNOSIS — R112 Nausea with vomiting, unspecified: Secondary | ICD-10-CM

## 2020-10-25 DIAGNOSIS — E119 Type 2 diabetes mellitus without complications: Secondary | ICD-10-CM

## 2020-10-25 DIAGNOSIS — R197 Diarrhea, unspecified: Secondary | ICD-10-CM

## 2020-10-25 MED ORDER — GVOKE HYPOPEN 2-PACK 0.5 MG/0.1ML ~~LOC~~ SOAJ
1.0000 | SUBCUTANEOUS | 2 refills | Status: DC | PRN
Start: 2020-10-25 — End: 2021-01-30

## 2020-10-25 MED ORDER — MECLIZINE HCL 12.5 MG PO TABS: 12.5000 mg | ORAL_TABLET | Freq: Three times a day (TID) | ORAL | 0 refills | Status: DC | PRN

## 2020-10-25 MED ORDER — LOSARTAN POTASSIUM 25 MG PO TABS: 25.0000 mg | ORAL_TABLET | Freq: Every day | ORAL | 1 refills | Status: DC

## 2020-10-25 NOTE — Progress Notes (Signed)
I,Yamilka Roman [employer] as a Education administrator for [employer], FNP.,have documented all relevant documentation on the behalf of [name], FNP,as directed by  [name], FNP while in the presence of [name], [place]. This visit occurred during the SARS-CoV-2 public health emergency.  Safety protocols were in place, including screening questions prior to the visit, additional usage of staff PPE, and extensive cleaning of exam room while observing appropriate contact time as indicated for disinfecting solutions.  Subjective:     Patient ID: Linda Mercado , female    DOB: [birth year] , 55 y.o.   MRN: [id number]   Chief Complaint  Patient presents with  . Diabetes  . Dizziness    Patient reports having dizziness for the past few days   . Vaginal Discharge    Patient stated she has some discharge that she noticed last week. She stated sometimes its clear and other times it is yellowish brown    HPI  Patient presents today for a diabetes f/u.  She is taking 10 units of tresiba and janumet   Diabetes She presents for her follow-up diabetic visit. She has type 2 diabetes mellitus. Hypoglycemia symptoms include dizziness. There are no hypoglycemic complications. There are no diabetic complications. Risk factors for coronary artery disease include sedentary lifestyle. Current diabetic treatment includes oral agent (triple therapy). (Blood sugars 68-123 with the highest of 168.  )  Dizziness  Vaginal Discharge The patient's primary symptoms include vaginal discharge.     Past Medical History:  Diagnosis Date  . Acid reflux   . Bradycardia   . Chronic back pain   . Diabetes mellitus without complication ([place])   . Hypertension   . Hypokalemia      Family History  Problem Relation Age of Onset  . Diabetes Father   . Hypertension Father   . Kidney disease Mother   . Hypertension Mother   . Bipolar disorder Sister   . Schizophrenia Sister   . Neuropathy Sister   . Hypertension  Sister   . Bipolar disorder Brother   . Bipolar disorder Brother   . Schizophrenia Brother   . Bipolar disorder Daughter   . Post-traumatic stress disorder Daughter   . Asthma Daughter   . Depression Daughter   . Bipolar disorder Daughter   . Post-traumatic stress disorder Daughter   . Asthma Daughter   . Depression Daughter   . Asthma Daughter   . Depression Daughter   . Asthma Daughter   . Stroke Neg Hx   . Cancer Neg Hx   . CAD Neg Hx      Current Outpatient Medications:  .  ACCU-CHEK GUIDE test strip, TEST TWICE DAILY AS DIRECTED, Disp: 100 strip, Rfl: 2 .  albuterol (VENTOLIN HFA) 108 (90 Base) MCG/ACT inhaler, INHALE 2 PUFFS INTO THE LUNGS EVERY 6 HOURS AS NEEDED FOR WHEEZING OR SHORTNESS OF BREATH, Disp: 20.1 g, Rfl: 1 .  AMITIZA 24 MCG capsule, Take 24 mcg by mouth 2 (two) times daily. , Disp: , Rfl:  .  atenolol (TENORMIN) 100 MG tablet, Take 1 tablet (100 mg total) by mouth daily., Disp: 90 tablet, Rfl: 1 .  atorvastatin (LIPITOR) 10 MG tablet, Take 1 tablet (10 mg total) by mouth at bedtime., Disp: 90 tablet, Rfl: 1 .  benzonatate (TESSALON PERLES) 100 MG capsule, Take 1 capsule (100 mg total) by mouth every 6 (six) hours as needed., Disp: 30 capsule, Rfl: 1 .  Blood Glucose Monitoring Suppl (ACCU-CHEK GUIDE) w/Device KIT, USE  TO CHECK BLOOD SUGAR TWICE DAILY, Disp: 1 kit, Rfl: 3 .  calcium gluconate 500 MG tablet, Take 1 tablet (500 mg total) by mouth 3 (three) times daily., Disp: 270 tablet, Rfl: 1 .  CAPLYTA 42 MG CAPS, Take 42 mg by mouth at bedtime. , Disp: , Rfl:  .  clonazePAM (KLONOPIN) 1 MG tablet, Take 1 mg by mouth 3 (three) times daily., Disp: , Rfl:  .  Cyanocobalamin (B-12) 500 MCG SUBL, One SL QD, Disp: 90 tablet, Rfl: 1 .  diclofenac Sodium (VOLTAREN) 1 % GEL, APPLY 4 GRAMS TO THE AFFECTED AREA FOUR TIMES DAILY, Disp: 400 g, Rfl: 1 .  docusate sodium (COLACE) 50 MG capsule, Take 50 mg by mouth daily. , Disp: , Rfl:  .  fluconazole (DIFLUCAN) 100 MG  tablet, Take 1 tablet (100 mg total) by mouth daily. Take 1 tablet by mouth now repeat in 5 days, Disp: 2 tablet, Rfl: 0 .  fluticasone (FLOVENT HFA) 110 MCG/ACT inhaler, Inhale 2 puffs into the lungs every 12 (twelve) hours., Disp: 3 each, Rfl: 1 .  Glucagon (GVOKE HYPOPEN 2-PACK) 0.5 MG/0.1ML SOAJ, Inject 1 each into the skin as needed., Disp: 0.2 mL, Rfl: 2 .  hydrochlorothiazide (HYDRODIURIL) 12.5 MG tablet, TAKE 1 TABLET(12.5 MG) BY MOUTH DAILY, Disp: 90 tablet, Rfl: 0 .  HYDROmorphone (DILAUDID) 4 MG tablet, Take 4 mg by mouth 4 (four) times daily as needed for severe pain., Disp: , Rfl:  .  hydrOXYzine (ATARAX/VISTARIL) 25 MG tablet, Take 25 mg by mouth in the morning, at noon, in the evening, and at bedtime., Disp: , Rfl:  .  Insulin Pen Needle (PEN NEEDLES) 32G X 4 MM MISC, Use as directed with tresiba, Disp: 50 each, Rfl: 2 .  Lancets (ACCU-CHEK MULTICLIX) lancets, Use to check blood sugars daily E11.69, Disp: 100 each, Rfl: 12 .  lidocaine (XYLOCAINE) 2 % solution, Use as directed 15 mLs in the mouth or throat 3 (three) times a week. , Disp: , Rfl:  .  Lidocaine 4 % PTCH, Place 1 patch onto the skin in the morning and at bedtime., Disp: 60 patch, Rfl: 5 .  LORazepam (ATIVAN) 2 MG tablet, Take 2 mg by mouth 4 (four) times daily as needed for anxiety. , Disp: , Rfl:  .  losartan (COZAAR) 25 MG tablet, Take 1 tablet (25 mg total) by mouth daily., Disp: 90 tablet, Rfl: 1 .  meclizine (ANTIVERT) 12.5 MG tablet, Take 1 tablet (12.5 mg total) by mouth 3 (three) times daily as needed for dizziness., Disp: 30 tablet, Rfl: 0 .  Multiple Vitamin (MULTIVITAMIN WITH MINERALS) TABS tablet, Take 1 tablet by mouth daily. , Disp: , Rfl:  .  NARCAN 4 MG/0.1ML LIQD nasal spray kit, Place 1 spray into the nose as directed., Disp: , Rfl: 0 .  Norgestimate-Ethinyl Estradiol Triphasic (TRI-ESTARYLLA) 0.18/0.215/0.25 MG-35 MCG tablet, Take 1 tablet by mouth daily., Disp: 90 tablet, Rfl: 1 .  ondansetron (ZOFRAN)  4 MG tablet, Take 4 mg by mouth as needed for nausea. , Disp: , Rfl:  .  PAIN MANAGEMENT INTRATHECAL, IT, PUMP, 1 each by Intrathecal route continuous. Intrathecal (IT) medication: fentanyl, clonidine, bupivicaine  Fentanyl 236m/bupivicaine150mg/clonidine40mg  51.966mper day, 40m74mer day, 1.22m21m5.4ml/66mn, Disp: , Rfl:  .  polyethylene glycol powder (GLYCOLAX/MIRALAX) 17 GM/SCOOP powder, MIX 17 GRAMS IN 8 OUNCES OF LIQUID AND DRINK DAILY FOR BOWELS, Disp: 238 g, Rfl: 1 .  promethazine (PHENERGAN) 25 MG tablet, Take 1 tablet (25 mg  total) by mouth every 6 (six) hours as needed for nausea or vomiting., Disp: 90 tablet, Rfl: 0 .  QUEtiapine (SEROQUEL XR) 400 MG 24 hr tablet, Take 400 mg by mouth 2 (two) times daily., Disp: , Rfl:  .  QUEtiapine (SEROQUEL) 300 MG tablet, Take 300 mg by mouth at bedtime., Disp: , Rfl:  .  sitaGLIPtin-metformin (JANUMET) 50-500 MG tablet, Take 1 tablet by mouth 2 (two) times daily with a meal., Disp: 60 tablet, Rfl: 2 .  tiZANidine (ZANAFLEX) 4 MG tablet, Take 4 mg by mouth every 4 (four) hours., Disp: , Rfl:  .  traZODone (DESYREL) 100 MG tablet, 5 qhs prescribed by psychiatry (Patient taking differently: Take 100-200 mg by mouth at bedtime. 5 qhs prescribed by psychiatry), Disp: 30 tablet, Rfl: 0 .  TRESIBA FLEXTOUCH 100 UNIT/ML FlexTouch Pen, ADMINISTER 10 UNITS UNDER THE SKIN DAILY, Disp: 3 mL, Rfl: 0 .  triamcinolone cream (KENALOG) 0.1 %, Apply 1 application topically daily as needed (for itching)., Disp: 30 g, Rfl: 2 .  TRINTELLIX 20 MG TABS tablet, Take 20 mg by mouth daily., Disp: , Rfl:  .  VRAYLAR 6 MG CAPS, Take 6 mg by mouth daily. (Patient not taking: Reported on 11/02/2020), Disp: , Rfl:  .  Continuous Blood Gluc Receiver ([place]) DEVI, Use to check blood sugars 4 times daily (Patient not taking: No sig reported), Disp: 1 each, Rfl: 2 .  Continuous Blood Gluc Sensor (DEXCOM G6 SENSOR) MISC, Use to check blood sugars 4 times daily (Patient not  taking: No sig reported), Disp: 3 each, Rfl: 2 .  Continuous Blood Gluc Transmit (DEXCOM G6 TRANSMITTER) MISC, Use to check blood sugars 4 times [id number] (Patient not taking: No sig reported), Disp: 1 each, Rfl: 2 .  dexlansoprazole (DEXILANT) 60 MG capsule, Take 1 capsule (60 mg total) by mouth daily., Disp: 90 capsule, Rfl: 0 .  tinidazole (TINDAMAX) 500 MG tablet, Take 1 tablet (500 mg total) by mouth daily with breakfast., Disp: 10 tablet, Rfl: 0   Allergies  Allergen Reactions  . Lisinopril Hives    Other reaction(s): anaphylaxis/angioedema  . Latex Hives  . Morphine And Related     Itching, skin get hot.   . Adhesive [Tape] Rash     Review of Systems  Respiratory: Negative.   Cardiovascular: Negative.   Genitourinary: Positive for vaginal discharge.  Neurological: Positive for dizziness.  Psychiatric/Behavioral: Negative.      Today's Vitals   10/25/20 1131  Temp: 98.3 F (36.8 C)  TempSrc: Oral  Weight: 155 lb 6.4 oz (70.5 kg)  Height: 5' 4.2" (1.631 m)  PainSc: 8    Body mass index is 26.51 kg/m.   Objective:  Physical Exam Vitals reviewed.  Constitutional:      General: She is not in acute distress.    Appearance: Normal appearance.  Cardiovascular:     Rate and Rhythm: Normal rate and regular rhythm.     Pulses: Normal pulses.     Heart sounds: Normal heart sounds. No murmur heard.   Pulmonary:     Effort: Pulmonary effort is normal. No respiratory distress.     Breath sounds: Normal breath sounds. No wheezing.  Skin:    Capillary Refill: Capillary refill takes less than 2 seconds.  Neurological:     General: No focal deficit present.     Mental Status: She is alert and oriented to person, place, and time.     Cranial Nerves: No cranial nerve deficit.  Psychiatric:        Mood and Affect: Mood normal.        Behavior: Behavior normal.        Thought Content: Thought content normal.        Judgment: Judgment normal.         Assessment And Plan:      1. Essential hypertension  Chronic, good control  Continue with current medications - CMP14+EGFR - losartan (COZAAR) 25 MG tablet; Take 1 tablet (25 mg total) by mouth daily.  Dispense: 90 tablet; Refill: 1  2. Type 2 diabetes mellitus without complication, without long-term current use of insulin (HCC)  Chronic, she is tolerating tresiba well and is having periods of hypoglycemia  Will provide gvoke for low blood sugars  We will start weaning her off the tresiba as her levels decrease - Hemoglobin A1c - Glucagon (GVOKE HYPOPEN 2-PACK) 0.5 MG/0.1ML SOAJ; Inject 1 each into the skin as needed.  Dispense: 0.2 mL; Refill: 2  3. Vaginal discharge  She has been treated x 2 for bacterial vaginosis will check her again  - Cervicovaginal ancillary only  4. Nausea vomiting and diarrhea  Will check abdomen xray  She already has antinausea medication - DG Abd 1 View; Future - DG Abd 1 View     Patient was given opportunity to ask questions. Patient verbalized understanding of the plan and was able to repeat key elements of the plan. All questions were answered to their satisfaction.  [name], FNP   I, [name], FNP, have reviewed all documentation for this visit. The documentation on 10/25/20 for the exam, diagnosis, procedures, and orders are all accurate and complete.   IF YOU HAVE BEEN REFERRED TO A SPECIALIST, IT MAY TAKE 1-2 WEEKS TO SCHEDULE/PROCESS THE REFERRAL. IF YOU HAVE NOT HEARD FROM US/SPECIALIST IN TWO WEEKS, PLEASE GIVE [place] A CALL AT [phone].   THE PATIENT IS ENCOURAGED TO PRACTICE SOCIAL DISTANCING DUE TO THE COVID-19 PANDEMIC.

## 2020-10-26 ENCOUNTER — Telehealth: Payer: Self-pay

## 2020-10-26 ENCOUNTER — Telehealth: Payer: Medicare Other

## 2020-10-26 LAB — CMP14+EGFR
ALT: 12 IU/L (ref 0–32)
AST: 14 IU/L (ref 0–40)
Albumin/Globulin Ratio: 1.6 (ref 1.2–2.2)
Albumin: 4.4 g/dL (ref 3.8–4.9)
Alkaline Phosphatase: 91 IU/L (ref 44–121)
BUN/Creatinine Ratio: 12 (ref 9–23)
BUN: 11 mg/dL (ref 6–24)
Bilirubin Total: 0.3 mg/dL (ref 0.0–1.2)
CO2: 22 mmol/L (ref 20–29)
Calcium: 9.5 mg/dL (ref 8.7–10.2)
Chloride: 99 mmol/L (ref 96–106)
Creatinine, Ser: 0.95 mg/dL (ref 0.57–1.00)
Globulin, Total: 2.7 g/dL (ref 1.5–4.5)
Glucose: 68 mg/dL (ref 65–99)
Potassium: 3.4 mmol/L — ABNORMAL LOW (ref 3.5–5.2)
Sodium: 143 mmol/L (ref 134–144)
Total Protein: 7.1 g/dL (ref 6.0–8.5)
eGFR: 71 mL/min/{1.73_m2} (ref >59)

## 2020-10-26 LAB — HEMOGLOBIN A1C
Est. average glucose Bld gHb Est-mCnc: 151 mg/dL
Hgb A1c MFr Bld: 6.9 % — ABNORMAL HIGH (ref 4.8–5.6)

## 2020-10-26 NOTE — Telephone Encounter (Signed)
  Chronic Care Management   Outreach Note  10/26/2020 Name: Linda Mercado MRN: [id number] DOB: 01/31/1966  Referred by: [name], FNP Reason for referral : Chronic Care Management (RN CM FU Call Attempt )   An unsuccessful telephone outreach was attempted today. The patient was referred to the case management team for assistance with care management and care coordination.   Follow Up Plan: A HIPAA compliant phone message was left for the patient providing contact information and requesting a return call. Telephone follow up appointment with care management team member scheduled for: 11/29/20  Barb Merino, RN, BSN, CCM Care Management Coordinator [place] [facility]  Direct Phone: [phone]

## 2020-10-27 ENCOUNTER — Other Ambulatory Visit: Payer: Self-pay

## 2020-10-27 DIAGNOSIS — K219 Gastro-esophageal reflux disease without esophagitis: Secondary | ICD-10-CM

## 2020-10-27 LAB — CERVICOVAGINAL ANCILLARY ONLY
Bacterial Vaginitis (gardnerella): POSITIVE — AB
Candida Glabrata: NEGATIVE
Candida Vaginitis: NEGATIVE
Chlamydia: NEGATIVE
Comment: NEGATIVE
Comment: NEGATIVE
Comment: NEGATIVE
Comment: NEGATIVE
Comment: NEGATIVE
Comment: NORMAL
Neisseria Gonorrhea: NEGATIVE
Trichomonas: NEGATIVE

## 2020-10-27 MED ORDER — DEXLANSOPRAZOLE 60 MG PO CPDR: 1.0000 | DELAYED_RELEASE_CAPSULE | Freq: Every day | ORAL | 0 refills | Status: DC

## 2020-10-30 ENCOUNTER — Other Ambulatory Visit: Payer: Self-pay | Admitting: Nurse Practitioner

## 2020-10-30 DIAGNOSIS — N760 Acute vaginitis: Secondary | ICD-10-CM

## 2020-10-30 DIAGNOSIS — B9689 Other specified bacterial agents as the cause of diseases classified elsewhere: Secondary | ICD-10-CM

## 2020-10-30 MED ORDER — TINIDAZOLE 500 MG PO TABS: 500.0000 mg | ORAL_TABLET | Freq: Every day | ORAL | 0 refills | Status: DC

## 2020-10-31 ENCOUNTER — Encounter: Payer: Self-pay | Admitting: Nurse Practitioner

## 2020-11-01 ENCOUNTER — Telehealth: Payer: Self-pay

## 2020-11-01 NOTE — Progress Notes (Signed)
11/01/20-Attempted to outreach the patient to remind her of CCM Call appointment with Orlando Penner, CPP on 11/02/20 at 11:00 AM. No answer, left a message with appointment date and time. Informed the patient in message to have medications and supplements near during phone visit.  Orlando Penner, CPP Notified.  Raynelle Highland, [place] Pharmacist Assistant [phone] CCM Total Time: 9 minutes

## 2020-11-02 ENCOUNTER — Ambulatory Visit (INDEPENDENT_AMBULATORY_CARE_PROVIDER_SITE_OTHER): Payer: MEDICARE

## 2020-11-02 DIAGNOSIS — E782 Mixed hyperlipidemia: Secondary | ICD-10-CM | POA: Diagnosis not present

## 2020-11-02 DIAGNOSIS — E119 Type 2 diabetes mellitus without complications: Secondary | ICD-10-CM | POA: Diagnosis not present

## 2020-11-02 NOTE — Progress Notes (Signed)
Chronic Care Management Pharmacy Note  11/14/2020 Name:  Linda Mercado MRN:  [id number] DOB:  October 09, 1965  Subjective: Linda Mercado is an 55 y.o. year old female who is a primary patient of [name], [place].  The CCM team was consulted for assistance with disease management and care coordination needs.    Engaged with patient by telephone for follow up visit in response to provider referral for pharmacy case management and/or care coordination services.   Consent to Services:  The patient was given information about Chronic Care Management services, agreed to services, and gave verbal consent prior to initiation of services.  Please see initial visit note for detailed documentation.   Patient Care Team: [name], FNP as PCP - General (General Practice) Rex Kras Claudette Stapler, RN as Case Manager Mayford Knife, [facility] (Pharmacist)  Recent office visits: 10/25/2020 PCP OV   Recent consult visits: 10/09/2020 Palliative care, chronic pain home visit  Hospital visits: None in previous 6 months  Objective:  Lab Results  Component Value Date   CREATININE 0.95 10/25/2020   BUN 11 10/25/2020   GFRNONAA 52 (L) 08/24/2020   GFRAA 60 08/24/2020   NA 143 10/25/2020   K 3.4 (L) 10/25/2020   CALCIUM 9.5 10/25/2020   CO2 22 10/25/2020   GLUCOSE 68 10/25/2020    Lab Results  Component Value Date/Time   HGBA1C 6.9 (H) 10/25/2020 01:33 PM   HGBA1C 12.3 (H) 08/24/2020 03:51 PM    Last diabetic Eye exam:  Lab Results  Component Value Date/Time   HMDIABEYEEXA No Retinopathy 09/08/2020 12:00 AM    Last diabetic Foot exam: No results found for: HMDIABFOOTEX   Lab Results  Component Value Date   CHOL 120 08/24/2020   HDL 41 08/24/2020   LDLCALC 50 08/24/2020   TRIG 175 (H) 08/24/2020   CHOLHDL 2.9 08/24/2020    Hepatic Function Latest Ref Rng & Units 10/25/2020 08/24/2020 09/17/2018  Total Protein 6.0 - 8.5 g/dL 7.1 7.5 7.7  Albumin 3.8 - 4.9 g/dL 4.4 4.5 4.6  AST 0 - 40 IU/L _0 ALT 0 - 32 IU/L _1 Alk Phosphatase 44 - 121 IU/L 91 140(H) 110  Total Bilirubin 0.0 - 1.2 mg/dL 0.3 0.3 <0.2    Lab Results  Component Value Date/Time   TSH 4.920 (H) 08/24/2020 03:51 PM   TSH 1.660 02/18/2019 12:47 PM   FREET4 1.16 02/23/2019 03:58 PM    CBC Latest Ref Rng & Units 10/06/2019 10/06/2019 10/06/2019  WBC 4.0 - 10.5 K/uL - 7.2 6.3  Hemoglobin 12.0 - 15.0 g/dL 13.6 13.0 13.5  Hematocrit 36.0 - 46.0 % 40.0 39.5 42.0  Platelets 150 - 400 K/uL - 332 367    Lab Results  Component Value Date/Time   VD25OH 51.6 08/24/2020 03:51 PM   VD25OH 142.0 (H) 10/12/2019 05:03 PM    Clinical ASCVD: No  The ASCVD Risk score Mikey Bussing DC Jr., et al., 2013) failed to calculate for the following reasons:   The valid total cholesterol range is 130 to 320 mg/dL    Depression screen [facility] 2/9 09/13/2020 03/25/2019 02/18/2019  Decreased Interest _2 Down, Depressed, Hopeless _3 PHQ - 2 Score _4 Altered sleeping _5 Tired, decreased energy 0 2 3  Change in appetite _6 Feeling bad or failure about yourself  0 2 2  Trouble concentrating _7 Moving slowly  or fidgety/restless 0 2 2  Suicidal thoughts 0 0 -  PHQ-9 Score _0 Difficult doing work/chores Somewhat difficult Very difficult -     Social History   Tobacco Use  Smoking Status Never Smoker  Smokeless Tobacco Never Used   BP Readings from Last 3 Encounters:  09/13/20 (!) 142/80  08/30/20 124/80  05/22/20 120/76   Pulse Readings from Last 3 Encounters:  09/13/20 80  08/30/20 76  05/22/20 68   Wt Readings from Last 3 Encounters:  10/25/20 155 lb 6.4 oz (70.5 kg)  09/13/20 164 lb (74.4 kg)  08/30/20 160 lb 3.2 oz (72.7 kg)   BMI Readings from Last 3 Encounters:  10/25/20 26.51 kg/m  09/13/20 26.47 kg/m  08/30/20 26.17 kg/m    Assessment/Interventions: Review of patient past medical history, allergies, medications, health status, including review of consultants reports, laboratory  and other test data, was performed as part of comprehensive evaluation and provision of chronic care management services.   SDOH:  (Social Determinants of Health) assessments and interventions performed: No  SDOH Screenings   Alcohol Screen: Not on file  Depression (PHQ2-9): Medium Risk  . PHQ-2 Score: 12  Financial Resource Strain: Low Risk   . Difficulty of Paying Living Expenses: Not hard at all  Food Insecurity: Food Insecurity Present  . Worried About Charity fundraiser in the Last Year: Sometimes true  . Ran Out of Food in the Last Year: Sometimes true  Housing: Not on file  Physical Activity: Inactive  . Days of Exercise per Week: 0 days  . Minutes of Exercise per Session: 0 min  Social Connections: Not on file  Stress: Stress Concern Present  . Feeling of Stress : Very much  Tobacco Use: Low Risk   . Smoking Tobacco Use: Never Smoker  . Smokeless Tobacco Use: Never Used  Transportation Needs: No Transportation Needs  . Lack of Transportation (Medical): No  . Lack of Transportation (Non-Medical): No    CCM Care Plan  Allergies  Allergen Reactions  . Lisinopril Hives    Other reaction(s): anaphylaxis/angioedema  . Latex Hives  . Morphine And Related     Itching, skin get hot.   . Adhesive [Tape] Rash    Medications Reviewed Today    Reviewed by Mayford Knife, RPH (Pharmacist) on 11/02/20 at 1144  Med List Status: <None>  Medication Order Taking? Sig Documenting Provider Last Dose Status Informant  ACCU-CHEK GUIDE test strip [id number] Yes TEST TWICE DAILY AS DIRECTED [name], FNP Taking Active   albuterol (VENTOLIN HFA) 108 (90 Base) MCG/ACT inhaler [id number] Yes INHALE 2 PUFFS INTO THE LUNGS EVERY 6 HOURS AS NEEDED FOR WHEEZING OR SHORTNESS OF Placido Sou, FNP Taking Active   AMITIZA 24 MCG capsule [id number] Yes Take 24 mcg by mouth 2 (two) times daily.  [provider] Taking Active Multiple Informants  atenolol (TENORMIN) 100 MG  tablet [id number] Yes Take 1 tablet (100 mg total) by mouth daily. [name], FNP Taking Active   atorvastatin (LIPITOR) 10 MG tablet [id number] Yes Take 1 tablet (10 mg total) by mouth at bedtime. [name], FNP Taking Active   benzonatate (TESSALON PERLES) 100 MG capsule [id number] Yes Take 1 capsule (100 mg total) by mouth every 6 (six) hours as needed. [name], FNP Taking Active   Blood Glucose Monitoring Suppl (ACCU-CHEK GUIDE) w/Device Drucie Opitz [id number] Yes USE TO CHECK BLOOD SUGAR TWICE DAILY [name], FNP Taking Active   calcium gluconate 500  MG tablet [id number] Yes Take 1 tablet (500 mg total) by mouth 3 (three) times daily. [name], FNP Taking Active   CAPLYTA 42 MG CAPS [id number] Yes Take 42 mg by mouth at bedtime.  [provider] Taking Active Multiple Informants  clonazePAM (KLONOPIN) 1 MG tablet [id number] Yes Take 1 mg by mouth 3 (three) times daily. [provider] Taking Active Multiple Informants  Continuous Blood Gluc Receiver (DEXCOM G6 RECEIVER) DEVI [id number] No Use to check blood sugars 4 times daily  Patient not taking: No sig reported   [name], FNP Not Taking Active   Continuous Blood Gluc Sensor (DEXCOM G6 SENSOR) MISC [id number] No Use to check blood sugars 4 times daily  Patient not taking: No sig reported   [name], FNP Not Taking Active   Continuous Blood Gluc Transmit (DEXCOM G6 TRANSMITTER) MISC [id number] No Use to check blood sugars 4 times [id number]  Patient not taking: No sig reported   [name], FNP Not Taking Active   Cyanocobalamin (B-12) 500 MCG SUBL [id number] Yes One SL QD [name], FNP Taking Active   dexlansoprazole (DEXILANT) 60 MG capsule [id number] Yes Take 1 capsule (60 mg total) by mouth daily. [name], FNP Taking Active   diclofenac Sodium (VOLTAREN) 1 % GEL [id number] Yes APPLY 4 GRAMS TO THE AFFECTED AREA FOUR TIMES DAILY [name], FNP Taking Active   docusate sodium (COLACE) 50 MG  capsule [id number] Yes Take 50 mg by mouth daily.  [provider] Taking Active Multiple Informants  fluconazole (DIFLUCAN) 100 MG tablet [id number] Yes Take 1 tablet (100 mg total) by mouth daily. Take 1 tablet by mouth now repeat in 5 days [name], FNP Taking Active   fluticasone (FLOVENT HFA) 110 MCG/ACT inhaler [id number] Yes Inhale 2 puffs into the lungs every 12 (twelve) hours. [name], FNP Taking Active   Glucagon (GVOKE HYPOPEN 2-PACK) 0.5 MG/0.1ML Darden Palmer [id number] Yes Inject 1 each into the skin as needed. [name], FNP Taking Active   hydrochlorothiazide (HYDRODIURIL) 12.5 MG tablet [id number] Yes TAKE 1 TABLET(12.5 MG) BY MOUTH DAILY [name], FNP Taking Active   HYDROmorphone (DILAUDID) 4 MG tablet [id number] Yes Take 4 mg by mouth 4 (four) times daily as needed for severe pain. [provider] Taking Active Multiple Informants  hydrOXYzine (ATARAX/VISTARIL) 25 MG tablet [id number] Yes Take 25 mg by mouth in the morning, at noon, in the evening, and at bedtime. [provider] Taking Active Multiple Informants  Insulin Pen Needle (PEN NEEDLES) 32G X 4 MM MISC [id number] Yes Use as directed with Rudean Curt, FNP Taking Active   Lancets ([place]) lancets [id number] Yes Use to check blood sugars daily E11.69 [name], FNP Taking Active   lidocaine (XYLOCAINE) 2 % solution [id number] Yes Use as directed 15 mLs in the mouth or throat 3 (three) times a week.  [provider] Taking Active Multiple Informants  Lidocaine 4 % PTCH [id number] Yes Place 1 patch onto the skin in the morning and at bedtime. [name], FNP Taking Active   LORazepam (ATIVAN) 2 MG tablet [id number] Yes Take 2 mg by mouth 4 (four) times daily as needed for anxiety.  [provider] Taking Active   losartan (COZAAR) 25 MG tablet [id number] Yes Take 1 tablet (25 mg total) by mouth daily. [name], FNP Taking Active   meclizine (ANTIVERT)  12.5 MG tablet [id number] Yes Take 1 tablet (12.5 mg total) by mouth 3 (three) times daily as needed for  dizziness. [name], FNP Taking Active   Multiple Vitamin (MULTIVITAMIN WITH MINERALS) TABS tablet [id number] Yes Take 1 tablet by mouth daily.  [provider] Taking Active Multiple Informants  NARCAN 4 MG/0.1ML LIQD nasal spray kit [id number] Yes Place 1 spray into the nose as directed. [provider] Taking Active Multiple Informants  Norgestimate-Ethinyl Estradiol Triphasic (TRI-ESTARYLLA) 0.18/0.215/0.25 MG-35 MCG tablet [id number] Yes Take 1 tablet by mouth daily. [name], FNP Taking Active   ondansetron [facility]) 4 MG tablet [id number] Yes Take 4 mg by mouth as needed for nausea.  [provider] Taking Active Multiple Informants  PAIN MANAGEMENT INTRATHECAL, IT, PUMP [id number] Yes 1 each by Intrathecal route continuous. Intrathecal (IT) medication: fentanyl, clonidine, bupivicaine  Fentanyl $RemoveB'250mg'qIoohWWH$ /bupivicaine$Remove'150mg'TiAeUng$ /clonidi'2mg'$   51.$Re'97mg'gaA$  per day, $Remove'2mg'pnmkoNG$  per day, 1.9mcg 15.28ml/qmin [provider] Taking Active Multiple Informants  polyethylene glycol powder (GLYCOLAX/MIRALAX) 17 GM/SCOOP powder [id number] Yes MIX 17 GRAMS IN 8 OUNCES OF LIQUID AND DRINK DAILY FOR BOWELS Rodriguez-Southworth, [name], PA-C Taking Active   promethazine (PHENERGAN) 25 MG tablet [id number] Yes Take 1 tablet (25 mg total) by mouth every 6 (six) hours as needed for nausea or vomiting. [name], FNP Taking Active   QUEtiapine (SEROQUEL XR) 400 MG 24 hr tablet [id number] Yes Take 400 mg by mouth 2 (two) times daily. [provider] Taking Active Multiple Informants           Med Note Duffy Bruce, Legrand Como   Wed Mar 18, 2018  7:28 PM)    QUEtiapine (SEROQUEL) 300 MG tablet [id number] Yes Take 300 mg by mouth at bedtime. [provider] Taking Active Multiple Informants  sitaGLIPtin-metformin (JANUMET) 50-500 MG tablet [id number] Yes Take 1 tablet by mouth 2 (two)  times daily with a meal. [name], FNP Taking Active   tinidazole (TINDAMAX) 500 MG tablet [id number] Yes Take 1 tablet (500 mg total) by mouth daily with breakfast. [name], FNP Taking Active   tiZANidine (ZANAFLEX) 4 MG tablet [id number] Yes Take 4 mg by mouth every 4 (four) hours. [provider] Taking Active Multiple Informants  traZODone (DESYREL) 100 MG tablet [id number] Yes 5 qhs prescribed by psychiatry  Patient taking differently: Take 100-200 mg by mouth at bedtime. 5 qhs prescribed by psychiatry   Rodriguez-Southworth, [name], PA-C Taking Active Multiple Informants  TRESIBA FLEXTOUCH 100 UNIT/ML FlexTouch Pen [id number] Yes ADMINISTER 10 UNITS UNDER THE SKIN DAILY [name], FNP Taking Active   triamcinolone cream (KENALOG) 0.1 % [id number] Yes Apply 1 application topically daily as needed (for itching). Rodriguez-Southworth, [name], PA-C Taking Active Multiple Informants  TRINTELLIX 20 MG TABS tablet [id number] Yes Take 20 mg by mouth daily. [provider] Taking Active   VRAYLAR 6 MG CAPS [id number] No Take 6 mg by mouth daily.  Patient not taking: Reported on 11/02/2020   [provider] Not Taking Active            Med Note Jeoffrey Massed Jun 21, 2020  1:55 PM) D/C by psychiatrist Shelbie Hutching per patient           Patient Active Problem List   Diagnosis Date Noted  . Pain in right knee 11/17/2019  . Pain in left knee 05/06/2019  . Hashimoto's disease 04/05/2019  . Nausea 11/23/2018  . Frequent falls 09/17/2018  . Skin lesion 09/17/2018  . GERD (gastroesophageal reflux disease)   . Chronic midline low back pain with sciatica 05/27/2018  . Intractable pain 03/20/2018  . Bradycardia   . Chronic  pain syndrome 03/18/2018  . 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 Hypokalemia 03/18/2018  . Intractable back pain 01/03/2018  . HTN (hypertension) 01/03/2018  . Type 2 diabetes mellitus without complication ([place]) 85/27/7824  .  Vitamin D deficiency 07/11/2017  . Postmenopausal 04/24/2017  . Microalbuminuria 01/14/2017  . Dyspareunia in female 12/10/2016  . Endometriosis determined by laparoscopy 12/10/2016  . History of exploratory laparotomy 12/10/2016  . Pelvic pain 12/10/2016  . Bipolar 1 disorder ([place]) 09/19/2016  . Cyst of right ovary 09/19/2016  . Severe episode of recurrent major depressive disorder, with psychotic features ([place]) 09/19/2016  . Schizoaffective disorder, bipolar type ([place]) 09/19/2016  . Chronic pain 03/05/2016  . Non morbid obesity due to excess calories 03/05/2016  . S/P lumbar fusion 03/05/2016    Immunization History  Administered Date(s) Administered  . Influenza Inj Mdck Quad With Preservative 05/11/2018  . Influenza,inj,Quad PF,6+ Mos 09/19/2016, 04/24/2017  . PFIZER(Purple Top)SARS-COV-2 Vaccination 06/05/2020, 06/26/2020    Conditions to be addressed/monitored:  Hyperlipidemia and Diabetes  Care Plan : [place]  Updates made by Mayford Knife, [place] since 11/14/2020 12:00 AM    Problem: HTN, HLD, DM   Priority: High    Long-Range Goal: Disease Management   Recent Progress: On track  Priority: High  Note:   Current Barriers:  . Unable to independently afford treatment regimen . Unable to achieve control of glucose levels.    Pharmacist Clinical Goal(s):  [name] Over the next 30 days, patient will achieve adherence to monitoring guidelines and medication adherence to achieve therapeutic efficacy through collaboration with PharmD and provider.   Interventions: . 1:1 collaboration with [name], FNP regarding development and update of comprehensive plan of care as evidenced by provider attestation and co-signature . Inter-disciplinary care team collaboration (see longitudinal plan of care) . Comprehensive medication review performed; medication list updated in electronic medical record  Hypertension (BP goal <130/80) -controlled -Current  treatment: . Losartan 50 mg tablet daily  . Atenolol 100 mg take daily   . Hydrochlorothiazide 12.5 mg tablet daily  -Medications previously tried: losartan 25 mg,  -Current home readings: 126/80, 121/70 -Current dietary habits: patient is avoiding fried and fatty foods and she is using an air fryer, she also uses an indoor grill for her meats  -Current exercise habits: none at this time  -Denies hypotensive/hypertensive symptoms -Educated on BP goals and benefits of medications for prevention of heart attack, stroke and kidney damage; Daily salt intake goal < 2300 mg; Exercise goal of 150 minutes per week; Importance of home blood pressure monitoring; Proper BP monitoring technique; -Counseled to monitor BP at home once per week, document, and provide log at future appointments -Recommended to continue current medication  Hyperlipidemia: (LDL goal < 70) -Uncontrolled -Current treatment: . Atorvastatin 10 mg tablet once daily  o Taking at night before she goes to bed.   -Current dietary patterns: avoiding fried and fatty foods.  -Current exercise habits: patient is not exercising due to pain  -Educated on Cholesterol goals;  Benefits of statin for ASCVD risk reduction; Importance of limiting foods high in cholesterol; Exercise goal of 150 minutes per week; -Recommended to continue current medication  Diabetes (A1c goal <7%) -Uncontrolled -Current medications: . [place] once as needed . Tresiba - 10 units under the sking  . Janumet 50-500 mg - taking 1 tablet by mouth two times daily -Current home glucose readings . fasting glucose: 74-160  . post prandial glucose: 121-151 -Denies  hypoglycemic/hyperglycemic symptoms -Current meal patterns: patient reports mostly eating salad and protein, and avoid red meat. -She is baking and broiling her meats  -Patient has stopped drinking orange juice and is drinking a lot more water. Patient reports drinking 8-12 large  glasses of water per day  -Educated on A1c and blood sugar goals; Complications of diabetes including kidney damage, retinal damage, and cardiovascular disease; Benefits of routine self-monitoring of blood sugar; Carbohydrate counting and/or plate method  -Congratulated patient on bring her A1c down  -Counseled to check feet daily and get yearly eye exams  -Recommended to continue current medication   Patient Goals/Self-Care Activities . Patient will:  - take medications as prescribed  Follow Up Plan: The patient has been provided with contact information for the care management team and has been advised to call with any health related questions or concerns.       Medication Assistance: None required.  Patient affirms current coverage meets needs.  Patient's preferred pharmacy is:  [id number] [place] [place] [place]Phone: [phone]: [phone] [facility] [address]Phone: [phone]: [phone] Uses pill box? No - patient reports keeping her medication in the original contaiiners.  Pt endorses 80% compliance  We discussed: Benefits of medication synchronization, packaging and delivery as well as enhanced pharmacist oversight with [facility]. Patient decided to: Continue current medication management strategy  Care Plan and Follow Up Patient Decision:  Patient agrees to Care Plan and Follow-up.  Plan: Telephone follow up appointment with care management team member scheduled for:  12/26/2020 and The patient has been provided with contact information for the care management team and has been advised to call with any health related questions or concerns.   VOrlando Penner PharmD Clinical Pharmacist [facility] [phone]

## 2020-11-09 ENCOUNTER — Other Ambulatory Visit: Payer: Self-pay | Admitting: Nurse Practitioner

## 2020-11-13 ENCOUNTER — Other Ambulatory Visit: Payer: Self-pay | Admitting: Nurse Practitioner

## 2020-11-13 DIAGNOSIS — E119 Type 2 diabetes mellitus without complications: Secondary | ICD-10-CM

## 2020-11-14 NOTE — Patient Instructions (Signed)
Visit Information It was great speaking with you today!  Please let me know if you have any questions about our visit. Goals Addressed            This Visit's Progress   . Manage My Medicine       Timeframe:  Long-Range Goal Priority:  High Start Date:                             Expected End Date:                       Follow Up Date 12/26/2020   - call for medicine refill 2 or 3 days before it runs out - call if I am sick and can't take my medicine - use a pillbox to sort medicine - use an alarm clock or phone to remind me to take my medicine    Why is this important?   . These steps will help you keep on track with your medicines.          Patient Care Plan: CCM Pharmacy Care Plan    Problem Identified: HTN, HLD, DM   Priority: High    Long-Range Goal: Disease Management   Recent Progress: On track  Priority: High  Note:   Current Barriers:  . Unable to independently afford treatment regimen . Unable to achieve control of glucose levels.    Pharmacist Clinical Goal(s):  [name] Over the next 30 days, patient will achieve adherence to monitoring guidelines and medication adherence to achieve therapeutic efficacy through collaboration with PharmD and provider.   Interventions: . 1:1 collaboration with [name], FNP regarding development and update of comprehensive plan of care as evidenced by provider attestation and co-signature . Inter-disciplinary care team collaboration (see longitudinal plan of care) . Comprehensive medication review performed; medication list updated in electronic medical record  Hypertension (BP goal <130/80) -controlled -Current treatment: . Losartan 50 mg tablet daily  . Atenolol 100 mg take daily   . Hydrochlorothiazide 12.5 mg tablet daily  -Medications previously tried: losartan 25 mg,  -Current home readings: 126/80, 121/70 -Current dietary habits: patient is avoiding fried and fatty foods and she is using an air fryer, she also  uses an indoor grill for her meats  -Current exercise habits: none at this time  -Denies hypotensive/hypertensive symptoms -Educated on BP goals and benefits of medications for prevention of heart attack, stroke and kidney damage; Daily salt intake goal < 2300 mg; Exercise goal of 150 minutes per week; Importance of home blood pressure monitoring; Proper BP monitoring technique; -Counseled to monitor BP at home once per week, document, and provide log at future appointments -Recommended to continue current medication  Hyperlipidemia: (LDL goal < 70) -Uncontrolled -Current treatment: . Atorvastatin 10 mg tablet once daily  o Taking at night before she goes to bed.   -Current dietary patterns: avoiding fried and fatty foods.  -Current exercise habits: patient is not exercising due to pain  -Educated on Cholesterol goals;  Benefits of statin for ASCVD risk reduction; Importance of limiting foods high in cholesterol; Exercise goal of 150 minutes per week; -Recommended to continue current medication  Diabetes (A1c goal <7%) -Uncontrolled -Current medications: . Adams once as needed . Tresiba - 10 units under the sking  . Janumet 50-500 mg - taking 1 tablet by mouth two times daily -Current home glucose readings . fasting glucose: 74-160  .  post prandial glucose: 121-151 -Denies hypoglycemic/hyperglycemic symptoms -Current meal patterns: patient reports mostly eating salad and protein, and avoid red meat. -She is baking and broiling her meats  -Patient has stopped drinking orange juice and is drinking a lot more water. Patient reports drinking 8-12 large glasses of water per day  -Educated on A1c and blood sugar goals; Complications of diabetes including kidney damage, retinal damage, and cardiovascular disease; Benefits of routine self-monitoring of blood sugar; Carbohydrate counting and/or plate method  -Congratulated patient on bring her A1c down  -Counseled  to check feet daily and get yearly eye exams  -Recommended to continue current medication   Patient Goals/Self-Care Activities . Patient will:  - take medications as prescribed  Follow Up Plan: The patient has been provided with contact information for the care management team and has been advised to call with any health related questions or concerns.      Patient agreed to services and verbal consent obtained.   The patient verbalized understanding of instructions, educational materials, and care plan provided today and agreed to receive a mailed copy of patient instructions, educational materials, and care plan.   Orlando Penner, PharmD Clinical Pharmacist [facility] [phone]

## 2020-11-16 ENCOUNTER — Other Ambulatory Visit: Payer: Self-pay

## 2020-11-16 DIAGNOSIS — N760 Acute vaginitis: Secondary | ICD-10-CM

## 2020-11-16 DIAGNOSIS — B9689 Other specified bacterial agents as the cause of diseases classified elsewhere: Secondary | ICD-10-CM

## 2020-11-17 ENCOUNTER — Other Ambulatory Visit: Payer: Self-pay | Admitting: Nurse Practitioner

## 2020-11-21 ENCOUNTER — Other Ambulatory Visit: Payer: Self-pay

## 2020-11-21 MED ORDER — JANUMET 50-500 MG PO TABS: 1.0000 | ORAL_TABLET | Freq: Two times a day (BID) | ORAL | 2 refills | Status: DC

## 2020-11-23 ENCOUNTER — Other Ambulatory Visit: Payer: Self-pay | Admitting: Nurse Practitioner

## 2020-11-24 ENCOUNTER — Telehealth: Payer: Self-pay

## 2020-11-24 NOTE — Chronic Care Management (AMB) (Signed)
Chronic Care Management Pharmacy Assistant   Name: Linda Mercado  MRN: [id number] DOB: 1965-07-29   Reason for Encounter: Disease State/ Hypertension    Recent office visits:  None  Recent consult visits:  None  Hospital visits:  None in previous 6 months  Medications: Outpatient Encounter Medications as of 11/24/2020  Medication Sig Note  . ACCU-CHEK GUIDE test strip TEST TWICE DAILY AS DIRECTED   . albuterol (VENTOLIN HFA) 108 (90 Base) MCG/ACT inhaler INHALE 2 PUFFS INTO THE LUNGS EVERY 6 HOURS AS NEEDED FOR WHEEZING OR SHORTNESS OF BREATH   . AMITIZA 24 MCG capsule Take 24 mcg by mouth 2 (two) times daily.    [name] atenolol (TENORMIN) 100 MG tablet Take 1 tablet (100 mg total) by mouth daily.   [name] atorvastatin (LIPITOR) 10 MG tablet Take 1 tablet (10 mg total) by mouth at bedtime.   . benzonatate (TESSALON PERLES) 100 MG capsule Take 1 capsule (100 mg total) by mouth every 6 (six) hours as needed.   . Blood Glucose Monitoring Suppl (ACCU-CHEK GUIDE) w/Device KIT USE TO CHECK BLOOD SUGAR TWICE DAILY   . calcium gluconate 500 MG tablet Take 1 tablet (500 mg total) by mouth 3 (three) times daily.   . CAPLYTA 42 MG CAPS Take 42 mg by mouth at bedtime.    . clonazePAM (KLONOPIN) 1 MG tablet Take 1 mg by mouth 3 (three) times daily.   . Continuous Blood Gluc Receiver (DEXCOM G6 RECEIVER) DEVI Use to check blood sugars 4 times daily (Patient not taking: No sig reported)   . Continuous Blood Gluc Sensor (DEXCOM G6 SENSOR) MISC Use to check blood sugars 4 times daily (Patient not taking: No sig reported)   . Continuous Blood Gluc Transmit (DEXCOM G6 TRANSMITTER) MISC Use to check blood sugars 4 times [id number] (Patient not taking: No sig reported)   . Cyanocobalamin (B-12) 500 MCG SUBL One SL QD   . dexlansoprazole (DEXILANT) 60 MG capsule Take 1 capsule (60 mg total) by mouth daily.   . diclofenac Sodium (VOLTAREN) 1 % GEL APPLY 4 GRAMS TO THE AFFECTED AREA FOUR TIMES DAILY   . docusate  sodium (COLACE) 50 MG capsule Take 50 mg by mouth daily.    [name] FLOVENT HFA 110 MCG/ACT inhaler INHALE 2 PUFFS INTO THE LUNGS EVERY 12 HOURS   . fluconazole (DIFLUCAN) 100 MG tablet Take 1 tablet (100 mg total) by mouth daily. Take 1 tablet by mouth now repeat in 5 days   . Glucagon (GVOKE HYPOPEN 2-PACK) 0.5 MG/0.1ML SOAJ Inject 1 each into the skin as needed.   . hydrochlorothiazide (HYDRODIURIL) 12.5 MG tablet TAKE 1 TABLET(12.5 MG) BY MOUTH DAILY   . HYDROmorphone (DILAUDID) 4 MG tablet Take 4 mg by mouth 4 (four) times daily as needed for severe pain.   . hydrOXYzine (ATARAX/VISTARIL) 25 MG tablet Take 25 mg by mouth in the morning, at noon, in the evening, and at bedtime.   . Insulin Pen Needle (PEN NEEDLES) 32G X 4 MM MISC Use as directed with tresiba   . Lancets (ACCU-CHEK MULTICLIX) lancets Use to check blood sugars daily E11.69   . lidocaine (XYLOCAINE) 2 % solution Use as directed 15 mLs in the mouth or throat 3 (three) times a week.    . Lidocaine 4 % PTCH Place 1 patch onto the skin in the morning and at bedtime.   [name] LORazepam (ATIVAN) 2 MG tablet Take 2 mg by mouth 4 (four) times daily as  needed for anxiety.    [name] losartan (COZAAR) 25 MG tablet Take 1 tablet (25 mg total) by mouth daily.   . meclizine (ANTIVERT) 12.5 MG tablet Take 1 tablet (12.5 mg total) by mouth 3 (three) times daily as needed for dizziness.   . Multiple Vitamin (MULTIVITAMIN WITH MINERALS) TABS tablet Take 1 tablet by mouth daily.    [name] NARCAN 4 MG/0.1ML LIQD nasal spray kit Place 1 spray into the nose as directed.   . ondansetron (ZOFRAN) 4 MG tablet Take 4 mg by mouth as needed for nausea.    [name] PAIN MANAGEMENT INTRATHECAL, IT, PUMP 1 each by Intrathecal route continuous. Intrathecal (IT) medication: fentanyl, clonidine, bupivicaine  Fentanyl 263m/bupivicaine150mg/clonidine67mg  51.9667mper day, 67m29mer day, 1.27m84m5.4ml/1mn   . polyethylene glycol powder (GLYCOLAX/MIRALAX) 17 GM/SCOOP powder MIX 17 GRAMS IN 8  OUNCES OF LIQUID AND DRINK DAILY FOR BOWELS   . promethazine (PHENERGAN) 25 MG tablet Take 1 tablet (25 mg total) by mouth every 6 (six) hours as needed for nausea or vomiting.   . QUEMarland Kitcheniapine (SEROQUEL XR) 400 MG 24 hr tablet Take 400 mg by mouth 2 (two) times daily.   . QUEtiapine (SEROQUEL) 300 MG tablet Take 300 mg by mouth at bedtime.   . sitaGLIPtin-metformin (JANUMET) 50-500 MG tablet Take 1 tablet by mouth 2 (two) times daily with a meal.   . tinidazole (TINDAMAX) 500 MG tablet Take 1 tablet (500 mg total) by mouth daily with breakfast.   . tiZANidine (ZANAFLEX) 4 MG tablet Take 4 mg by mouth every 4 (four) hours.   . traZODone (DESYREL) 100 MG tablet 5 qhs prescribed by psychiatry (Patient taking differently: Take 100-200 mg by mouth at bedtime. 5 qhs prescribed by psychiatry)   . TRESIBA FLEXTOUCH 100 UNIT/ML FlexTouch Pen ADMINISTER 10 UNITS UNDER THE SKIN DAILY   . TRI-SPRINTEC 0.18/0.215/0.25 MG-35 MCG tablet TAKE 1 TABLET BY MOUTH DAILY   . triamcinolone cream (KENALOG) 0.1 % Apply 1 application topically daily as needed (for itching).   . TRINTELLIX 20 MG TABS tablet Take 20 mg by mouth daily.   . VRAMarland KitchenLAR 6 MG CAPS Take 6 mg by mouth daily. (Patient not taking: Reported on 11/02/2020) 06/21/2020: D/C by psychiatrist Lida Shelbie Hutchingpatient    No facility-administered encounter medications on file as of 11/24/2020.    11-24-2020: 1st attempt LVM 11-27-2020: 2nd attempt LVM 12-05-2020: 3rd attempt LVM with her and emergency contact  [place]-[phone]

## 2020-11-29 ENCOUNTER — Telehealth: Payer: Medicaid Other

## 2020-11-29 ENCOUNTER — Telehealth: Payer: Self-pay

## 2020-11-29 NOTE — Telephone Encounter (Signed)
  Chronic Care Management   Outreach Note  11/29/2020 Name: Linda Mercado MRN: [id number] DOB: [birth year]  Referred by: [name], FNP Reason for referral : Chronic Care Management (RNCM Follow Up Call Attempt )   An unsuccessful telephone outreach was attempted today. The patient was referred to the case management team for assistance with care management and care coordination.   Follow Up Plan: A HIPAA compliant phone message was left for the patient providing contact information and requesting a return call. Telephone follow up appointment with care management team member scheduled for: 12/22/20  Barb Merino, RN, BSN, CCM Care Management Coordinator [place] [facility]  Direct Phone: [phone]

## 2020-12-02 ENCOUNTER — Other Ambulatory Visit: Payer: Self-pay | Admitting: Nurse Practitioner

## 2020-12-05 ENCOUNTER — Other Ambulatory Visit: Payer: Self-pay

## 2020-12-05 MED ORDER — ALCOHOL SWABS PADS: MEDICATED_PAD | 2 refills | Status: DC

## 2020-12-05 MED ORDER — ACCU-CHEK MULTICLIX LANCETS MISC: 12 refills | Status: DC

## 2020-12-05 MED ORDER — ACCU-CHEK GUIDE VI STRP: ORAL_STRIP | 5 refills | Status: DC

## 2020-12-07 ENCOUNTER — Other Ambulatory Visit: Payer: Self-pay | Admitting: Nurse Practitioner

## 2020-12-07 DIAGNOSIS — I10 Essential (primary) hypertension: Secondary | ICD-10-CM

## 2020-12-13 ENCOUNTER — Other Ambulatory Visit: Payer: Self-pay | Admitting: Nurse Practitioner

## 2020-12-13 ENCOUNTER — Other Ambulatory Visit: Payer: Self-pay

## 2020-12-13 DIAGNOSIS — E119 Type 2 diabetes mellitus without complications: Secondary | ICD-10-CM

## 2020-12-13 MED ORDER — FREESTYLE LITE W/DEVICE KIT: 1.0000 | PACK | Freq: Four times a day (QID) | 0 refills | Status: DC

## 2020-12-13 MED ORDER — FREESTYLE LANCETS MISC: 2 refills | Status: DC

## 2020-12-13 MED ORDER — FREESTYLE LITE TEST VI STRP: ORAL_STRIP | 2 refills | Status: DC

## 2020-12-14 ENCOUNTER — Other Ambulatory Visit: Payer: Self-pay

## 2020-12-14 DIAGNOSIS — E119 Type 2 diabetes mellitus without complications: Secondary | ICD-10-CM

## 2020-12-14 MED ORDER — FREESTYLE LITE TEST VI STRP: ORAL_STRIP | 2 refills | Status: DC

## 2020-12-17 ENCOUNTER — Other Ambulatory Visit: Payer: Self-pay | Admitting: Nurse Practitioner

## 2020-12-20 ENCOUNTER — Other Ambulatory Visit: Payer: Self-pay

## 2020-12-20 ENCOUNTER — Other Ambulatory Visit: Payer: Self-pay | Admitting: Nurse Practitioner

## 2020-12-20 DIAGNOSIS — E119 Type 2 diabetes mellitus without complications: Secondary | ICD-10-CM

## 2020-12-20 MED ORDER — FREESTYLE LIBRE 14 DAY READER DEVI: 3 refills | Status: DC

## 2020-12-20 MED ORDER — FREESTYLE LIBRE 14 DAY SENSOR MISC: 3 refills | Status: DC

## 2020-12-21 ENCOUNTER — Other Ambulatory Visit: Payer: Self-pay

## 2020-12-21 MED ORDER — FREESTYLE LIBRE 2 READER DEVI: 3 refills | Status: DC

## 2020-12-21 MED ORDER — FREESTYLE LIBRE 2 SENSOR MISC: 3 refills | Status: DC

## 2020-12-22 ENCOUNTER — Telehealth: Payer: Medicaid Other

## 2020-12-25 ENCOUNTER — Telehealth: Payer: Self-pay

## 2020-12-25 NOTE — Progress Notes (Signed)
Called patient to reschedule appointment on 12-25-2020 at 9:00 to a different day. Left patient a voicemail.  [place] Pharmacist Assistant [phone]

## 2020-12-26 ENCOUNTER — Telehealth: Payer: Medicare Other

## 2021-01-04 ENCOUNTER — Encounter: Payer: Self-pay | Admitting: Nurse Practitioner

## 2021-01-10 ENCOUNTER — Ambulatory Visit: Payer: Medicaid Other

## 2021-01-15 ENCOUNTER — Inpatient Hospital Stay: Admission: RE | Admit: 2021-01-15 | Payer: Medicaid Other | Source: Ambulatory Visit

## 2021-01-20 ENCOUNTER — Other Ambulatory Visit: Payer: Self-pay | Admitting: Nurse Practitioner

## 2021-01-20 DIAGNOSIS — E119 Type 2 diabetes mellitus without complications: Secondary | ICD-10-CM

## 2021-01-25 ENCOUNTER — Encounter: Payer: Medicaid Other | Admitting: Obstetrics and Gynecology

## 2021-01-30 ENCOUNTER — Other Ambulatory Visit: Payer: Self-pay

## 2021-01-30 ENCOUNTER — Ambulatory Visit: Payer: Medicaid Other | Admitting: Nurse Practitioner

## 2021-01-30 ENCOUNTER — Other Ambulatory Visit: Payer: Self-pay | Admitting: Nurse Practitioner

## 2021-01-30 DIAGNOSIS — I10 Essential (primary) hypertension: Secondary | ICD-10-CM

## 2021-01-30 DIAGNOSIS — E119 Type 2 diabetes mellitus without complications: Secondary | ICD-10-CM

## 2021-01-30 DIAGNOSIS — K219 Gastro-esophageal reflux disease without esophagitis: Secondary | ICD-10-CM

## 2021-01-30 MED ORDER — LOSARTAN POTASSIUM 25 MG PO TABS: 25.0000 mg | ORAL_TABLET | Freq: Every day | ORAL | 1 refills | Status: DC

## 2021-01-30 MED ORDER — CALCIUM GLUCONATE 500 MG PO TABS: 500.0000 mg | ORAL_TABLET | Freq: Three times a day (TID) | ORAL | 1 refills | Status: AC

## 2021-01-30 MED ORDER — GVOKE HYPOPEN 2-PACK 0.5 MG/0.1ML ~~LOC~~ SOAJ: 1.0000 | SUBCUTANEOUS | 2 refills | Status: DC | PRN

## 2021-01-30 MED ORDER — ALCOHOL SWABS PADS: MEDICATED_PAD | 2 refills | Status: DC

## 2021-01-30 MED ORDER — ATENOLOL 100 MG PO TABS: ORAL_TABLET | ORAL | 1 refills | Status: DC

## 2021-01-30 MED ORDER — ALBUTEROL SULFATE HFA 108 (90 BASE) MCG/ACT IN AERS: 2.0000 | INHALATION_SPRAY | Freq: Four times a day (QID) | RESPIRATORY_TRACT | 1 refills | Status: DC | PRN

## 2021-01-30 MED ORDER — JANUMET 50-500 MG PO TABS: 1.0000 | ORAL_TABLET | Freq: Two times a day (BID) | ORAL | 2 refills | Status: DC

## 2021-01-30 MED ORDER — HYDROCHLOROTHIAZIDE 12.5 MG PO TABS: ORAL_TABLET | ORAL | 0 refills | Status: DC

## 2021-01-30 MED ORDER — ATORVASTATIN CALCIUM 10 MG PO TABS: ORAL_TABLET | ORAL | 1 refills | Status: DC

## 2021-01-30 MED ORDER — DEXLANSOPRAZOLE 60 MG PO CPDR: 1.0000 | DELAYED_RELEASE_CAPSULE | Freq: Every day | ORAL | 0 refills | Status: DC

## 2021-01-31 ENCOUNTER — Other Ambulatory Visit: Payer: Self-pay

## 2021-01-31 MED ORDER — ACCU-CHEK GUIDE VI STRP: ORAL_STRIP | 5 refills | Status: DC

## 2021-02-02 ENCOUNTER — Other Ambulatory Visit: Payer: Self-pay | Admitting: Nurse Practitioner

## 2021-02-05 ENCOUNTER — Encounter: Payer: Medicaid Other | Admitting: Obstetrics and Gynecology

## 2021-02-06 ENCOUNTER — Telehealth: Payer: Medicaid Other

## 2021-02-07 ENCOUNTER — Other Ambulatory Visit: Payer: Self-pay

## 2021-02-07 ENCOUNTER — Encounter: Payer: Self-pay | Admitting: Nurse Practitioner

## 2021-02-07 ENCOUNTER — Ambulatory Visit (INDEPENDENT_AMBULATORY_CARE_PROVIDER_SITE_OTHER): Payer: MEDICARE | Admitting: Nurse Practitioner

## 2021-02-07 VITALS — BP 128/74 | HR 79 | Temp 98.3°F | Ht 64.2 in | Wt 148.6 lb

## 2021-02-07 DIAGNOSIS — E119 Type 2 diabetes mellitus without complications: Secondary | ICD-10-CM | POA: Diagnosis not present

## 2021-02-07 DIAGNOSIS — Z9989 Dependence on other enabling machines and devices: Secondary | ICD-10-CM

## 2021-02-07 DIAGNOSIS — D2271 Melanocytic nevi of right lower limb, including hip: Secondary | ICD-10-CM | POA: Diagnosis not present

## 2021-02-07 DIAGNOSIS — I10 Essential (primary) hypertension: Secondary | ICD-10-CM

## 2021-02-07 DIAGNOSIS — E559 Vitamin D deficiency, unspecified: Secondary | ICD-10-CM

## 2021-02-07 DIAGNOSIS — R42 Dizziness and giddiness: Secondary | ICD-10-CM | POA: Diagnosis not present

## 2021-02-07 MED ORDER — JANUMET 50-1000 MG PO TABS: 1.0000 | ORAL_TABLET | Freq: Two times a day (BID) | ORAL | 1 refills | Status: DC

## 2021-02-07 MED ORDER — ONETOUCH VERIO W/DEVICE KIT: PACK | 1 refills | Status: AC

## 2021-02-07 MED ORDER — ONETOUCH VERIO VI STRP: ORAL_STRIP | 3 refills | Status: AC

## 2021-02-07 MED ORDER — ONETOUCH DELICA LANCETS 33G MISC: 3 refills | Status: AC

## 2021-02-07 NOTE — Progress Notes (Signed)
I,Katawbba Wiggins,acting as a Education administrator for [employer], FNP.,have documented all relevant documentation on the behalf of Linda Brine, FNP,as directed by  Linda Brine, FNP while in the presence of Linda Mercado, [place].  This visit occurred during the SARS-CoV-2 public health emergency.  Safety protocols were in place, including screening questions prior to the visit, additional usage of staff PPE, and extensive cleaning of exam room while observing appropriate contact time as indicated for disinfecting solutions.  Subjective:     Patient ID: Linda Mercado , female    DOB: 17-Jun-1966 , 55 y.o.   MRN: [id number]   Chief Complaint  Patient presents with   Diabetes     HPI  Patient presents today for a diabetes f/u.   Continues to have dizziness when changing positions  Mammogram on Friday and Women's next week  Diabetes She presents for her follow-up diabetic visit. She has type 2 diabetes mellitus. Hypoglycemia symptoms include dizziness. Pertinent negatives for hypoglycemia include no headaches. There are no diabetic associated symptoms. Pertinent negatives for diabetes include no fatigue. There are no hypoglycemic complications. There are no diabetic complications. Risk factors for coronary artery disease include sedentary lifestyle. Current diabetic treatment includes oral agent (triple therapy). She is following a generally healthy diet. When asked about meal planning, she reported none. She has had a previous visit with a dietitian. She rarely participates in exercise. (She has not been checking her blood sugars due to not having strips that are paid for, blood sugar in am 130-140. She takes an extra metformin when it is this high. )  Dizziness This is a chronic problem. The current episode started more than 1 month ago. The problem occurs daily. Pertinent negatives include no abdominal pain, fatigue or headaches. She has tried nothing for the symptoms.  Vaginal Discharge Pertinent  negatives include no abdominal pain or headaches.    Past Medical History:  Diagnosis Date   Acid reflux    Bradycardia    Chronic back pain    Diabetes mellitus without complication ([place])    Hypertension    Hypokalemia      Family History  Problem Relation Age of Onset   Kidney disease Mother    Hypertension Mother    Diabetes Father    Hypertension Father    Bipolar disorder Sister    Schizophrenia Sister    Neuropathy Sister    Hypertension Sister    Bipolar disorder Daughter    Post-traumatic stress disorder Daughter    Asthma Daughter    Depression Daughter    Bipolar disorder Daughter    Post-traumatic stress disorder Daughter    Asthma Daughter    Depression Daughter    Asthma Daughter    Depression Daughter    Asthma Daughter    Breast cancer Paternal Grandmother    Bipolar disorder Brother    Bipolar disorder Brother    Schizophrenia Brother    Stroke Neg Hx    Cancer Neg Hx    CAD Neg Hx      Current Outpatient Medications:    albuterol (VENTOLIN HFA) 108 (90 Base) MCG/ACT inhaler, Inhale 2 puffs into the lungs every 6 (six) hours as needed for wheezing or shortness of breath., Disp: 20.1 g, Rfl: 1   Alcohol Swabs PADS, Use as directed to check blood sugars, Disp: 100 each, Rfl: 2   AMITIZA 24 MCG capsule, Take 24 mcg by mouth 2 (two) times daily. , Disp: , Rfl:    atenolol (TENORMIN) 100  MG tablet, TAKE 1 TABLET(100 MG) BY MOUTH DAILY, Disp: 90 tablet, Rfl: 1   atorvastatin (LIPITOR) 10 MG tablet, TAKE 1 TABLET(10 MG) BY MOUTH AT BEDTIME, Disp: 90 tablet, Rfl: 1   benzonatate (TESSALON PERLES) 100 MG capsule, Take 1 capsule (100 mg total) by mouth every 6 (six) hours as needed., Disp: 30 capsule, Rfl: 1   calcium gluconate 500 MG tablet, Take 1 tablet (500 mg total) by mouth 3 (three) times daily., Disp: 270 tablet, Rfl: 1   CAPLYTA 42 MG CAPS, Take 42 mg by mouth at bedtime. , Disp: , Rfl:    clonazePAM (KLONOPIN) 1 MG tablet, Take 1 mg by mouth 3  (three) times daily., Disp: , Rfl:    Cyanocobalamin (B-12) 500 MCG SUBL, One SL QD, Disp: 90 tablet, Rfl: 1   dexlansoprazole (DEXILANT) 60 MG capsule, Take 1 capsule (60 mg total) by mouth daily., Disp: 90 capsule, Rfl: 0   diclofenac Sodium (VOLTAREN) 1 % GEL, APPLY 4 GRAMS TO THE AFFECTED AREA FOUR TIMES DAILY, Disp: 400 g, Rfl: 1   docusate sodium (COLACE) 50 MG capsule, Take 50 mg by mouth daily. , Disp: , Rfl:    FLOVENT HFA 110 MCG/ACT inhaler, INHALE 2 PUFFS INTO THE LUNGS EVERY 12 HOURS, Disp: 36 g, Rfl: 1   Glucagon (GVOKE HYPOPEN 2-PACK) 0.5 MG/0.1ML SOAJ, Inject 1 each into the skin as needed., Disp: 0.2 mL, Rfl: 2   hydrochlorothiazide (HYDRODIURIL) 12.5 MG tablet, TAKE 1 TABLET(12.5 MG) BY MOUTH DAILY, Disp: 90 tablet, Rfl: 0   HYDROmorphone (DILAUDID) 4 MG tablet, Take 4 mg by mouth 4 (four) times daily as needed for severe pain., Disp: , Rfl:    hydrOXYzine (ATARAX/VISTARIL) 25 MG tablet, Take 25 mg by mouth in the morning, at noon, in the evening, and at bedtime., Disp: , Rfl:    Insulin Pen Needle (PEN NEEDLES) 32G X 4 MM MISC, Use as directed with tresiba, Disp: 50 each, Rfl: 2   lidocaine (XYLOCAINE) 2 % solution, Use as directed 15 mLs in the mouth or throat 3 (three) times a week. , Disp: , Rfl:    Lidocaine 4 % PTCH, Place 1 patch onto the skin in the morning and at bedtime., Disp: 60 patch, Rfl: 5   LORazepam (ATIVAN) 2 MG tablet, Take 2 mg by mouth 4 (four) times daily as needed for anxiety. , Disp: , Rfl:    losartan (COZAAR) 25 MG tablet, Take 1 tablet (25 mg total) by mouth daily., Disp: 90 tablet, Rfl: 1   Multiple Vitamin (MULTIVITAMIN WITH MINERALS) TABS tablet, Take 1 tablet by mouth daily. , Disp: , Rfl:    NARCAN 4 MG/0.1ML LIQD nasal spray kit, Place 1 spray into the nose as directed., Disp: , Rfl: 0   ondansetron (ZOFRAN) 4 MG tablet, Take 4 mg by mouth as needed for nausea. , Disp: , Rfl:    PAIN MANAGEMENT INTRATHECAL, IT, PUMP, 1 each by Intrathecal route  continuous. Intrathecal (IT) medication: fentanyl, clonidine, bupivicaine  Fentanyl 248m/bupivicaine150mg/clonidine74mg  51.963mper day, 74m31mer day, 1.50m68m5.4ml/65mn, Disp: , Rfl:    polyethylene glycol powder (GLYCOLAX/MIRALAX) 17 GM/SCOOP powder, MIX 17 GRAMS IN 8 OUNCES OF LIQUID AND DRINK DAILY FOR BOWELS, Disp: 238 g, Rfl: 1   promethazine (PHENERGAN) 25 MG tablet, Take 1 tablet (25 mg total) by mouth every 6 (six) hours as needed for nausea or vomiting., Disp: 90 tablet, Rfl: 0   QUEtiapine (SEROQUEL XR) 400 MG 24 hr tablet, Take  400 mg by mouth 2 (two) times daily., Disp: , Rfl:    QUEtiapine (SEROQUEL) 300 MG tablet, Take 300 mg by mouth at bedtime., Disp: , Rfl:    sitaGLIPtin-metformin (JANUMET) 50-1000 MG tablet, Take 1 tablet by mouth 2 (two) times daily with a meal., Disp: 180 tablet, Rfl: 1   tiZANidine (ZANAFLEX) 4 MG tablet, Take 4 mg by mouth every 4 (four) hours., Disp: , Rfl:    traZODone (DESYREL) 100 MG tablet, 5 qhs prescribed by psychiatry (Patient taking differently: Take 100-200 mg by mouth at bedtime. 5 qhs prescribed by psychiatry), Disp: 30 tablet, Rfl: 0   TRI-SPRINTEC 0.18/0.215/0.25 MG-35 MCG tablet, TAKE 1 TABLET BY MOUTH DAILY, Disp: 84 tablet, Rfl: 1   triamcinolone cream (KENALOG) 0.1 %, Apply 1 application topically daily as needed (for itching)., Disp: 30 g, Rfl: 2   TRINTELLIX 20 MG TABS tablet, Take 20 mg by mouth daily., Disp: , Rfl:    VRAYLAR 6 MG CAPS, Take 6 mg by mouth daily., Disp: , Rfl:    Blood Glucose Monitoring Suppl (ONETOUCH VERIO) w/Device KIT, Use as directed to check 1 time per day dx: e11.65, Disp: 1 kit, Rfl: 1   glucose blood (ACCU-CHEK GUIDE) test strip, USE TO TEST TWICE DAILY AS DIRECTED, Disp: 100 strip, Rfl: 5   meclizine (ANTIVERT) 12.5 MG tablet, Take 1 tablet (12.5 mg total) by mouth 3 (three) times daily as needed for dizziness., Disp: 30 tablet, Rfl: 0   OneTouch Delica Lancets 22Q MISC, Use as directed to check 1 time per day  dx: e11.65, Disp: 100 each, Rfl: 3   ONETOUCH VERIO test strip, Use as directed to check 1 time per day dx: e11.65, Disp: 100 each, Rfl: 3   Allergies  Allergen Reactions   Lisinopril Hives    Other reaction(s): anaphylaxis/angioedema   Latex Hives   Morphine And Related     Itching, skin get hot.    Adhesive [Tape] Rash     Review of Systems  Constitutional: Negative.  Negative for fatigue.  Respiratory: Negative.    Cardiovascular: Negative.   Gastrointestinal: Negative.  Negative for abdominal pain.  Neurological:  Positive for dizziness. Negative for headaches.  Psychiatric/Behavioral: Negative.    All other systems reviewed and are negative.   Today's Vitals   02/07/21 1048  BP: 128/74  Pulse: 79  Temp: 98.3 F (36.8 C)  TempSrc: Oral  Weight: 148 lb 9.6 oz (67.4 kg)  Height: 5' 4.2" (1.631 m)  PainSc: 10-Worst pain ever  PainLoc: Generalized   Body mass index is 25.35 kg/m.  Wt Readings from Last 3 Encounters:  02/07/21 148 lb 9.6 oz (67.4 kg)  10/25/20 155 lb 6.4 oz (70.5 kg)  09/13/20 164 lb (74.4 kg)    BP Readings from Last 3 Encounters:  02/07/21 128/74  09/13/20 (!) 142/80  08/30/20 124/80    Objective:  Physical Exam Constitutional:      Appearance: Normal appearance. She is normal weight.  Cardiovascular:     Rate and Rhythm: Normal rate and regular rhythm.     Pulses: Normal pulses.     Heart sounds: Normal heart sounds. No murmur heard. Pulmonary:     Effort: Pulmonary effort is normal. No respiratory distress.     Breath sounds: Normal breath sounds. No wheezing.  Abdominal:     General: Abdomen is flat. Bowel sounds are normal.     Palpations: Abdomen is soft.  Musculoskeletal:        General:  Normal range of motion.     Cervical back: Normal range of motion and neck supple.     Comments: Using cane  Skin:    General: Skin is warm and dry.     Capillary Refill: Capillary refill takes less than 2 seconds.     Comments: She has an  irregular shaped nevus on right inner thigh  Neurological:     General: No focal deficit present.     Mental Status: She is alert and oriented to person, place, and time.  Psychiatric:        Mood and Affect: Mood normal.        Behavior: Behavior normal.        Thought Content: Thought content normal.        Judgment: Judgment normal.        Assessment And Plan:     1. Type 2 diabetes mellitus without complication, without long-term current use of insulin (HCC) Chronic She is doing better with her blood sugar Will check HgbA1c - Hemoglobin A1c - BMP8+EGFR - sitaGLIPtin-metformin (JANUMET) 50-1000 MG tablet; Take 1 tablet by mouth 2 (two) times daily with a meal.  Dispense: 180 tablet; Refill: 1  2. Essential hypertension Chronic, good control  3. Vitamin D deficiency Will check vitamin D level and supplement as needed.    Also encouraged to spend 15 minutes in the sun daily.   4. Dizziness This is ongoing, this may be related to polypharmacy vs dehydration She may need to be evaluated by neurology if this persists  5. Nevus of right thigh She has an irregular shape nevus that is almost flat to the skin Will refer to dermatology  - Ambulatory referral to Dermatology    Patient was given opportunity to ask questions. Patient verbalized understanding of the plan and was able to repeat key elements of the plan. All questions were answered to their satisfaction.  Linda Brine, FNP   I, Linda Brine, FNP, have reviewed all documentation for this visit. The documentation on 02/07/21 for the exam, diagnosis, procedures, and orders are all accurate and complete.   IF YOU HAVE BEEN REFERRED TO A SPECIALIST, IT MAY TAKE 1-2 WEEKS TO SCHEDULE/PROCESS THE REFERRAL. IF YOU HAVE NOT HEARD FROM US/SPECIALIST IN TWO WEEKS, PLEASE GIVE [place] A CALL AT [phone] X [phone].   THE PATIENT IS ENCOURAGED TO PRACTICE SOCIAL DISTANCING DUE TO THE COVID-19 PANDEMIC.

## 2021-02-07 NOTE — Patient Instructions (Signed)

## 2021-02-08 ENCOUNTER — Telehealth: Payer: Self-pay

## 2021-02-08 LAB — BMP8+EGFR
BUN/Creatinine Ratio: 11 (ref 9–23)
BUN: 9 mg/dL (ref 6–24)
CO2: 22 mmol/L (ref 20–29)
Calcium: 10 mg/dL (ref 8.7–10.2)
Chloride: 99 mmol/L (ref 96–106)
Creatinine, Ser: 0.85 mg/dL (ref 0.57–1.00)
Glucose: 111 mg/dL — ABNORMAL HIGH (ref 65–99)
Potassium: 3.8 mmol/L (ref 3.5–5.2)
Sodium: 140 mmol/L (ref 134–144)
eGFR: 81 mL/min/{1.73_m2} (ref >59)

## 2021-02-08 LAB — HEMOGLOBIN A1C
Est. average glucose Bld gHb Est-mCnc: 123 mg/dL
Hgb A1c MFr Bld: 5.9 % — ABNORMAL HIGH (ref 4.8–5.6)

## 2021-02-08 NOTE — Chronic Care Management (AMB) (Signed)
Chronic Care Management Pharmacy Assistant   Name: Linda Mercado  MRN: [id number] DOB: [birth year]   Reason for Encounter: Disease State/ Hypertension   Recent office visits:  10-25-2020 [name], [place]. START Glucagon 0.5 mg inject 1 mg as needed. START Meclizine 12.5 mg 3 times daily as needed for dizziness. DECREASE Losartan 50 mg TO 25 mg.  02-07-2021 [name], FNP. INCREASE Janumet 50-500 mg twice daily TO 50-1000 mg twice daily. Completed diflucan. STOP Tresiba 10 units daily. STOP Tindamax. Referral placed for dermatology. A1C= 5.9. Glucose= 111  Recent consult visits:  10-09-2020 Laverda Sorenson I, NP (Palliative care). Physical exam  10-24-2020 Lennie Odor, MD ([facility]). START Dilaudid 2 mg every 8 hours as needed for pain. START Tizanidine 4 mg every 6 hours as needed for pain. Cervicovaginal ancillary only Bacteria Vaginosis= positive. CMP14+EGFR Potassium= 3.4. A1C= 6.5. DG Abd 1 view ordered.  01-10-2021 Lucia Bitter, MD ([facility]). Intrathecal pump refill.  Hospital visits:  None in previous 6 months  Medications: Outpatient Encounter Medications as of 02/08/2021  Medication Sig Note   albuterol (VENTOLIN HFA) 108 (90 Base) MCG/ACT inhaler Inhale 2 puffs into the lungs every 6 (six) hours as needed for wheezing or shortness of breath.    Alcohol Swabs PADS Use as directed to check blood sugars    AMITIZA 24 MCG capsule Take 24 mcg by mouth 2 (two) times daily.     atenolol (TENORMIN) 100 MG tablet TAKE 1 TABLET(100 MG) BY MOUTH DAILY    atorvastatin (LIPITOR) 10 MG tablet TAKE 1 TABLET(10 MG) BY MOUTH AT BEDTIME    benzonatate (TESSALON PERLES) 100 MG capsule Take 1 capsule (100 mg total) by mouth every 6 (six) hours as needed.    Blood Glucose Monitoring Suppl (ONETOUCH VERIO) w/Device KIT Use as directed to check 1 time per day dx: e11.65    calcium gluconate 500 MG tablet Take 1 tablet (500 mg total) by mouth 3 (three)  times daily.    CAPLYTA 42 MG CAPS Take 42 mg by mouth at bedtime.     clonazePAM (KLONOPIN) 1 MG tablet Take 1 mg by mouth 3 (three) times daily.    Cyanocobalamin (B-12) 500 MCG SUBL One SL QD    dexlansoprazole (DEXILANT) 60 MG capsule Take 1 capsule (60 mg total) by mouth daily.    diclofenac Sodium (VOLTAREN) 1 % GEL APPLY 4 GRAMS TO THE AFFECTED AREA FOUR TIMES DAILY    docusate sodium (COLACE) 50 MG capsule Take 50 mg by mouth daily.     FLOVENT HFA 110 MCG/ACT inhaler INHALE 2 PUFFS INTO THE LUNGS EVERY 12 HOURS    Glucagon (GVOKE HYPOPEN 2-PACK) 0.5 MG/0.1ML SOAJ Inject 1 each into the skin as needed.    hydrochlorothiazide (HYDRODIURIL) 12.5 MG tablet TAKE 1 TABLET(12.5 MG) BY MOUTH DAILY    HYDROmorphone (DILAUDID) 4 MG tablet Take 4 mg by mouth 4 (four) times daily as needed for severe pain.    hydrOXYzine (ATARAX/VISTARIL) 25 MG tablet Take 25 mg by mouth in the morning, at noon, in the evening, and at bedtime.    Insulin Pen Needle (PEN NEEDLES) 32G X 4 MM MISC Use as directed with tresiba    lidocaine (XYLOCAINE) 2 % solution Use as directed 15 mLs in the mouth or throat 3 (three) times a week.     Lidocaine 4 % PTCH Place 1 patch onto the skin in the morning and at bedtime.  LORazepam (ATIVAN) 2 MG tablet Take 2 mg by mouth 4 (four) times daily as needed for anxiety.     losartan (COZAAR) 25 MG tablet Take 1 tablet (25 mg total) by mouth daily.    meclizine (ANTIVERT) 12.5 MG tablet Take 1 tablet (12.5 mg total) by mouth 3 (three) times daily as needed for dizziness.    Multiple Vitamin (MULTIVITAMIN WITH MINERALS) TABS tablet Take 1 tablet by mouth daily.     NARCAN 4 MG/0.1ML LIQD nasal spray kit Place 1 spray into the nose as directed.    ondansetron (ZOFRAN) 4 MG tablet Take 4 mg by mouth as needed for nausea.     OneTouch Delica Lancets 67R MISC Use as directed to check 1 time per day dx: e11.65    ONETOUCH VERIO test strip Use as directed to check 1 time per day dx:  e11.65    PAIN MANAGEMENT INTRATHECAL, IT, PUMP 1 each by Intrathecal route continuous. Intrathecal (IT) medication: fentanyl, clonidine, bupivicaine  Fentanyl 299m/bupivicaine150mg/clonidine26mg  51.956mper day, 26m77mer day, 1.70m19m5.4ml/78mn    polyethylene glycol powder (GLYCOLAX/MIRALAX) 17 GM/SCOOP powder MIX 17 GRAMS IN 8 OUNCES OF LIQUID AND DRINK DAILY FOR BOWELS    promethazine (PHENERGAN) 25 MG tablet Take 1 tablet (25 mg total) by mouth every 6 (six) hours as needed for nausea or vomiting.    QUEtiapine (SEROQUEL XR) 400 MG 24 hr tablet Take 400 mg by mouth 2 (two) times daily.    QUEtiapine (SEROQUEL) 300 MG tablet Take 300 mg by mouth at bedtime.    sitaGLIPtin-metformin (JANUMET) 50-1000 MG tablet Take 1 tablet by mouth 2 (two) times daily with a meal.    tiZANidine (ZANAFLEX) 4 MG tablet Take 4 mg by mouth every 4 (four) hours.    traZODone (DESYREL) 100 MG tablet 5 qhs prescribed by psychiatry (Patient taking differently: Take 100-200 mg by mouth at bedtime. 5 qhs prescribed by psychiatry)    TRI-SPRINTEC 0.18/0.215/0.25 MG-35 MCG tablet TAKE 1 TABLET BY MOUTH DAILY    triamcinolone cream (KENALOG) 0.1 % Apply 1 application topically daily as needed (for itching).    TRINTELLIX 20 MG TABS tablet Take 20 mg by mouth daily.    VRAYLAR 6 MG CAPS Take 6 mg by mouth daily. 06/21/2020: D/C by psychiatrist Lida Shelbie Hutchingpatient    No facility-administered encounter medications on file as of 02/08/2021.   Reviewed chart prior to disease state call. Spoke with patient regarding BP  Recent Office Vitals: BP Readings from Last 3 Encounters:  02/07/21 128/74  09/13/20 (!) 142/80  08/30/20 124/80   Pulse Readings from Last 3 Encounters:  02/07/21 79  09/13/20 80  08/30/20 76    Wt Readings from Last 3 Encounters:  02/07/21 148 lb 9.6 oz (67.4 kg)  10/25/20 155 lb 6.4 oz (70.5 kg)  09/13/20 164 lb (74.4 kg)     Kidney Function Lab Results  Component Value Date/Time    CREATININE 0.85 02/07/2021 11:36 AM   CREATININE 0.95 10/25/2020 01:33 PM   GFRNONAA 52 (L) 08/24/2020 03:51 PM   GFRAA 60 08/24/2020 03:51 PM    BMP Latest Ref Rng & Units 02/07/2021 10/25/2020 08/24/2020  Glucose 65 - 99 mg/dL 111(H) 68 333(H)  BUN 6 - 24 mg/dL 9 11 9   Creatinine 0.57 - 1.00 mg/dL 0.85 0.95 1.19(H)  BUN/Creat Ratio 9 - 23 11 12  8(L)  Sodium 134 - 144 mmol/L 140 143 134  Potassium 3.5 - 5.2 mmol/L 3.8 3.4(L) 4.4  Chloride 96 - 106 mmol/L 99 99 92(L)  CO2 20 - 29 mmol/L 22 22 25   Calcium 8.7 - 10.2 mg/dL 10.0 9.5 9.9    Current antihypertensive regimen:  Losartan 50 mg daily Atenolol 100 mg daily   Hydrochlorothiazide 12.5 mg daily  How often are you checking your Blood Pressure? daily  Current home BP readings: 126/80  What recent interventions/DTPs have been made by any provider to improve Blood Pressure control since last CPP Visit:  Patient states she takes medications as directed.  Any recent hospitalizations or ED visits since last visit with CPP? No  What diet changes have been made to improve Blood Pressure Control?  Patient states she has limited her salt intake but doesn't have much of an appetite.   What exercise is being done to improve your Blood Pressure Control?  Patient states she tries to clean up around the house some but doesn't exercise any.  Adherence Review: Is the patient currently on ACE/ARB medication? Yes Does the patient have >5 day gap between last estimated fill dates? No  NOTES: Patient states blood pressure has been normal. Made a telephone appointment with Orlando Penner CPP in September.  Care Gaps: Pneumococcal Vaccine overdue Medicare wellness 09-19-2021  Star Rating Drugs: Losartan 50 mg- Last filled 01-30-2021 [facility] Atenolol 100 mg- Last filled 12-07-2020 [facility] Atorvastatin 10 mg- Last filled 12-19-2020 [facility] Janumet 50-1000 mg- Last filled 02-07-2021 [facility]  [place] Clinical  Pharmacist Assistant [phone]

## 2021-02-09 ENCOUNTER — Ambulatory Visit
Admission: RE | Admit: 2021-02-09 | Discharge: 2021-02-09 | Disposition: A | Discharge status: Home / Self Care | Payer: Medicare HMO | Source: Ambulatory Visit | Attending: Internal Medicine | Admitting: Internal Medicine

## 2021-02-09 ENCOUNTER — Other Ambulatory Visit: Payer: Self-pay

## 2021-02-09 DIAGNOSIS — Z1231 Encounter for screening mammogram for malignant neoplasm of breast: Secondary | ICD-10-CM

## 2021-02-12 ENCOUNTER — Other Ambulatory Visit: Payer: Self-pay | Admitting: Nurse Practitioner

## 2021-02-13 MED ORDER — MECLIZINE HCL 12.5 MG PO TABS: 12.5000 mg | ORAL_TABLET | Freq: Three times a day (TID) | ORAL | 0 refills | Status: DC | PRN

## 2021-02-15 ENCOUNTER — Telehealth: Payer: Self-pay | Admitting: Internal Medicine

## 2021-02-15 NOTE — Telephone Encounter (Signed)
Pt left me a message with the receptionist to call her when I work here on my next shift. I called her this am, but I could not get a hold of her. I called her again this pm and she requested to see have Aromatherapy consult. She gave him permission to save her cell number so I could call her with my Office number tomorrow since I am working right now. She was in agreement with that

## 2021-02-20 ENCOUNTER — Telehealth: Payer: Self-pay

## 2021-02-20 NOTE — Telephone Encounter (Signed)
Patient called to get her lab results. YL,RMA

## 2021-02-26 ENCOUNTER — Telehealth: Payer: Self-pay

## 2021-02-26 ENCOUNTER — Telehealth: Payer: Self-pay | Admitting: Nurse Practitioner

## 2021-02-26 ENCOUNTER — Ambulatory Visit (INDEPENDENT_AMBULATORY_CARE_PROVIDER_SITE_OTHER): Payer: MEDICARE

## 2021-02-26 ENCOUNTER — Ambulatory Visit: Payer: Self-pay

## 2021-02-26 ENCOUNTER — Telehealth: Payer: Medicaid Other

## 2021-02-26 DIAGNOSIS — I10 Essential (primary) hypertension: Secondary | ICD-10-CM

## 2021-02-26 DIAGNOSIS — E119 Type 2 diabetes mellitus without complications: Secondary | ICD-10-CM

## 2021-02-26 DIAGNOSIS — M5416 Radiculopathy, lumbar region: Secondary | ICD-10-CM

## 2021-02-26 DIAGNOSIS — G894 Chronic pain syndrome: Secondary | ICD-10-CM

## 2021-02-26 DIAGNOSIS — E063 Autoimmune thyroiditis: Secondary | ICD-10-CM

## 2021-02-26 DIAGNOSIS — K219 Gastro-esophageal reflux disease without esophagitis: Secondary | ICD-10-CM

## 2021-02-26 NOTE — Chronic Care Management (AMB) (Signed)
Chronic Care Management    Social Work Note  02/26/2021 Name: Linda Mercado MRN: [id number] DOB: [birth year]  Linda Mercado is a 55 y.o. year old female who is a primary care patient of [name], [place]. The CCM team was consulted to assist the patient with chronic disease management and/or care coordination needs related to: Transportation Needs .   Collaboration with [place]  for  resource needs  in response to provider referral for social work chronic care management and care coordination services.   Consent to Services:  The patient was given information about Chronic Care Management services, agreed to services, and gave verbal consent prior to initiation of services.  Please see initial visit note for detailed documentation.   Patient agreed to services and consent obtained.   Assessment: Review of patient past medical history, allergies, medications, and health status, including review of relevant consultants reports was performed today as part of a comprehensive evaluation and provision of chronic care management and care coordination services.     SW collaboration with [place] who indicates patient needs transportation assistance on Thursday 8.11 to see Dr. Benson Norway. SW placed an unsuccessful outbound call to the patient to assist with arranging transportation. SW left a HIPAA compliant voice message requesting a return call. Considering patient is almost out of window to access health plan transportation and SW is unable to arrange this without the patient participating in a joint call, this SW submitted a SW transportation request to [employer] who will follow up with the patient regarding ride details.  SDOH (Social Determinants of Health) assessments and interventions performed:    Advanced Directives Status: Not addressed in this encounter.  CCM Care Plan  Allergies  Allergen Reactions   Lisinopril Hives    Other reaction(s):  anaphylaxis/angioedema   Latex Hives   Morphine And Related     Itching, skin get hot.    Adhesive [Tape] Rash    Outpatient Encounter Medications as of 02/26/2021  Medication Sig Note   albuterol (VENTOLIN HFA) 108 (90 Base) MCG/ACT inhaler Inhale 2 puffs into the lungs every 6 (six) hours as needed for wheezing or shortness of breath.    Alcohol Swabs PADS Use as directed to check blood sugars    AMITIZA 24 MCG capsule Take 24 mcg by mouth 2 (two) times daily.     atenolol (TENORMIN) 100 MG tablet TAKE 1 TABLET(100 MG) BY MOUTH DAILY    atorvastatin (LIPITOR) 10 MG tablet TAKE 1 TABLET(10 MG) BY MOUTH AT BEDTIME    benzonatate (TESSALON PERLES) 100 MG capsule Take 1 capsule (100 mg total) by mouth every 6 (six) hours as needed.    Blood Glucose Monitoring Suppl (ONETOUCH VERIO) w/Device KIT Use as directed to check 1 time per day dx: e11.65    calcium gluconate 500 MG tablet Take 1 tablet (500 mg total) by mouth 3 (three) times daily.    CAPLYTA 42 MG CAPS Take 42 mg by mouth at bedtime.     clonazePAM (KLONOPIN) 1 MG tablet Take 1 mg by mouth 3 (three) times daily.    Cyanocobalamin (B-12) 500 MCG SUBL One SL QD    dexlansoprazole (DEXILANT) 60 MG capsule Take 1 capsule (60 mg total) by mouth daily.    diclofenac Sodium (VOLTAREN) 1 % GEL APPLY 4 GRAMS TO THE AFFECTED AREA FOUR TIMES DAILY    docusate sodium (COLACE) 50 MG capsule Take 50 mg by mouth daily.  FLOVENT HFA 110 MCG/ACT inhaler INHALE 2 PUFFS INTO THE LUNGS EVERY 12 HOURS    Glucagon (GVOKE HYPOPEN 2-PACK) 0.5 MG/0.1ML SOAJ Inject 1 each into the skin as needed.    glucose blood (ACCU-CHEK GUIDE) test strip USE TO TEST TWICE DAILY AS DIRECTED    hydrochlorothiazide (HYDRODIURIL) 12.5 MG tablet TAKE 1 TABLET(12.5 MG) BY MOUTH DAILY    HYDROmorphone (DILAUDID) 4 MG tablet Take 4 mg by mouth 4 (four) times daily as needed for severe pain.    hydrOXYzine (ATARAX/VISTARIL) 25 MG tablet Take 25 mg by mouth in the morning, at  noon, in the evening, and at bedtime.    Insulin Pen Needle (PEN NEEDLES) 32G X 4 MM MISC Use as directed with tresiba    lidocaine (XYLOCAINE) 2 % solution Use as directed 15 mLs in the mouth or throat 3 (three) times a week.     Lidocaine 4 % PTCH Place 1 patch onto the skin in the morning and at bedtime.    LORazepam (ATIVAN) 2 MG tablet Take 2 mg by mouth 4 (four) times daily as needed for anxiety.     losartan (COZAAR) 25 MG tablet Take 1 tablet (25 mg total) by mouth daily.    meclizine (ANTIVERT) 12.5 MG tablet Take 1 tablet (12.5 mg total) by mouth 3 (three) times daily as needed for dizziness.    Multiple Vitamin (MULTIVITAMIN WITH MINERALS) TABS tablet Take 1 tablet by mouth daily.     NARCAN 4 MG/0.1ML LIQD nasal spray kit Place 1 spray into the nose as directed.    ondansetron (ZOFRAN) 4 MG tablet Take 4 mg by mouth as needed for nausea.     OneTouch Delica Lancets 64P MISC Use as directed to check 1 time per day dx: e11.65    ONETOUCH VERIO test strip Use as directed to check 1 time per day dx: e11.65    PAIN MANAGEMENT INTRATHECAL, IT, PUMP 1 each by Intrathecal route continuous. Intrathecal (IT) medication: fentanyl, clonidine, bupivicaine  Fentanyl 261m/bupivicaine150mg/clonidine52mg  51.96mper day, 52m15mer day, 1.22m53m5.4ml/73mn    polyethylene glycol powder (GLYCOLAX/MIRALAX) 17 GM/SCOOP powder MIX 17 GRAMS IN 8 OUNCES OF LIQUID AND DRINK DAILY FOR BOWELS    promethazine (PHENERGAN) 25 MG tablet Take 1 tablet (25 mg total) by mouth every 6 (six) hours as needed for nausea or vomiting.    QUEtiapine (SEROQUEL XR) 400 MG 24 hr tablet Take 400 mg by mouth 2 (two) times daily.    QUEtiapine (SEROQUEL) 300 MG tablet Take 300 mg by mouth at bedtime.    sitaGLIPtin-metformin (JANUMET) 50-1000 MG tablet Take 1 tablet by mouth 2 (two) times daily with a meal.    tiZANidine (ZANAFLEX) 4 MG tablet Take 4 mg by mouth every 4 (four) hours.    traZODone (DESYREL) 100 MG tablet 5 qhs  prescribed by psychiatry (Patient taking differently: Take 100-200 mg by mouth at bedtime. 5 qhs prescribed by psychiatry)    TRI-SPRINTEC 0.18/0.215/0.25 MG-35 MCG tablet TAKE 1 TABLET BY MOUTH DAILY    triamcinolone cream (KENALOG) 0.1 % Apply 1 application topically daily as needed (for itching).    TRINTELLIX 20 MG TABS tablet Take 20 mg by mouth daily.    VRAYLAR 6 MG CAPS Take 6 mg by mouth daily. 06/21/2020: D/C by psychiatrist Lida Shelbie Hutchingpatient    No facility-administered encounter medications on file as of 02/26/2021.    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   Hypokalemia 03/18/2018   Intractable back pain 01/03/2018   HTN (hypertension) 01/03/2018   Type 2 diabetes mellitus without complication ([place]) 55/73/2202   Vitamin D deficiency 07/11/2017   Postmenopausal 04/24/2017   Microalbuminuria 01/14/2017   Dyspareunia in female 12/10/2016   Endometriosis determined by laparoscopy 12/10/2016   History of exploratory laparotomy 12/10/2016   Pelvic pain 12/10/2016   Bipolar 1 disorder ([place]) 09/19/2016   Cyst of right ovary 09/19/2016   Severe episode of recurrent major depressive disorder, with psychotic features ([place]) 09/19/2016   Schizoaffective disorder, bipolar type ([place]) 09/19/2016   Chronic pain 03/05/2016   Non morbid obesity due to excess calories 03/05/2016   S/P lumbar fusion 03/05/2016    Conditions to be addressed/monitored: HTN and DMII; Transportation  There are no care plans that you recently modified to display for this patient.    Follow Up Plan:  No  SW follow up planned at this time. SW is available as needed to assist with resource needs.      Daneen Schick, BSW, CDP Social Worker, Certified Dementia Practitioner [place] [facility] [phone]

## 2021-02-26 NOTE — Telephone Encounter (Signed)
   Linda Mercado DOB: [birth year] MRN: [id number]   RIDER WAIVER AND RELEASE OF LIABILITY  For purposes of improving physical access to our facilities, [facility] is pleased to partner with third parties to provide [facility] patients or other authorized individuals the option of convenient, on-demand ground transportation services (the Technical brewer") through use of the technology service that enables users to request on-demand ground transportation from independent third-party providers.  By opting to use and accept these [employer], I, the undersigned, hereby agree on behalf of myself, and on behalf of any minor child using the Government social research officer for whom I am the parent or legal guardian, as follows:  Government social research officer provided to me are provided by independent third-party transportation providers who are not [employer] or employees and who are unaffiliated with [employer]. [facility] [place] is neither a transportation carrier nor a common or public carrier. [facility] has no control over the quality or safety of the transportation that occurs as a result of the [employer]. [facility] cannot guarantee that any third-party transportation provider will complete any arranged transportation service. [facility] makes no representation, warranty, or guarantee regarding the reliability, timeliness, quality, safety, suitability, or availability of any of the Transport Services or that they will be error free. I fully understand that traveling by vehicle involves risks and dangers of serious bodily injury, including permanent disability, paralysis, and death. I agree, on behalf of myself and on behalf of any minor child using the Transport Services for whom I am the parent or legal guardian, that the entire risk arising out of my use of the [employer] remains solely with me, to the maximum extent permitted under applicable law. The [employer] are provided "as  is" and "as available." [facility] disclaims all representations and warranties, express, implied or statutory, not expressly set out in these terms, including the implied warranties of merchantability and fitness for a particular purpose. I hereby waive and release [facility], its agents, employees, officers, directors, representatives, insurers, attorneys, assigns, successors, subsidiaries, and affiliates from any and all past, present, or future claims, demands, liabilities, actions, causes of action, or suits of any kind directly or indirectly arising from acceptance and use of the [employer]. I further waive and release [facility] and its affiliates from all present and future liability and responsibility for any injury or death to persons or damages to property caused by or related to the use of the [employer]. I have read this Waiver and Release of Liability, and I understand the terms used in it and their legal significance. This Waiver is freely and voluntarily given with the understanding that my right (as well as the right of any minor child for whom I am the parent or legal guardian using the [employer]) to legal recourse against [facility] in connection with the [employer] is knowingly surrendered in return for use of these services.   I attest that I read the consent document to Linda Mercado, gave Ms. Neira the opportunity to ask questions and answered the questions asked (if any). I affirm that Linda Mercado then provided consent for she's participation in this program.     Darrick Meigs [place]

## 2021-02-26 NOTE — Telephone Encounter (Signed)
  Care Management   Follow Up Note   02/26/2021 Name: Linda Mercado MRN: [id number] DOB: Oct 02, 1965   Referred by: [name], FNP Reason for referral : Chronic Care Management (Unsuccessful call)   Collaboration with [place] who indicates the patient needs assistance with transportation for an [place] appointment. An unsuccessful telephone outreach was attempted today. The patient was referred to the case management team for assistance with care management and care coordination. SW left a HIPAA compliant voice message requesting a return call.  Follow Up Plan: The care management team will reach out to the patient again over the next 3 days.   Daneen Schick, BSW, CDP Social Worker, Certified Dementia Practitioner [facility] [phone]

## 2021-02-26 NOTE — Progress Notes (Signed)
This encounter was created in error - please disregard.

## 2021-02-27 ENCOUNTER — Ambulatory Visit: Payer: Self-pay

## 2021-02-27 DIAGNOSIS — E119 Type 2 diabetes mellitus without complications: Secondary | ICD-10-CM

## 2021-02-27 DIAGNOSIS — E063 Autoimmune thyroiditis: Secondary | ICD-10-CM

## 2021-02-27 DIAGNOSIS — I10 Essential (primary) hypertension: Secondary | ICD-10-CM

## 2021-02-27 NOTE — Chronic Care Management (AMB) (Signed)
Chronic Care Management    Social Work Note  02/27/2021 Name: Linda Mercado MRN: [id number] DOB: Jun 27, 1966  Linda Mercado is a 55 y.o. year old female who is a primary care patient of [name], [place]. The CCM team was consulted to assist the patient with chronic disease management and/or care coordination needs related to: Transportation Needs .   Engaged with patient by telephone for follow up visit in response to provider referral for social work chronic care management and care coordination services.   Consent to Services:  The patient was given information about Chronic Care Management services, agreed to services, and gave verbal consent prior to initiation of services.  Please see initial visit note for detailed documentation.   Patient agreed to services and consent obtained.   Assessment: Review of patient past medical history, allergies, medications, and health status, including review of relevant consultants reports was performed today as part of a comprehensive evaluation and provision of chronic care management and care coordination services.     SDOH (Social Determinants of Health) assessments and interventions performed:  SDOH Interventions    Flowsheet Row Most Recent Value  SDOH Interventions   Transportation Interventions [employer], Other (Comment)  [Educated on [employer] transportation benefit]        Advanced Directives Status: Not addressed in this encounter.  CCM Care Plan  Allergies  Allergen Reactions   Lisinopril Hives    Other reaction(s): anaphylaxis/angioedema   Latex Hives   Morphine And Related     Itching, skin get hot.    Adhesive [Tape] Rash    Outpatient Encounter Medications as of 02/27/2021  Medication Sig Note   albuterol (VENTOLIN HFA) 108 (90 Base) MCG/ACT inhaler Inhale 2 puffs into the lungs every 6 (six) hours as needed for wheezing or shortness of breath.    Alcohol Swabs PADS Use as directed to check blood  sugars    AMITIZA 24 MCG capsule Take 24 mcg by mouth 2 (two) times daily.     atenolol (TENORMIN) 100 MG tablet TAKE 1 TABLET(100 MG) BY MOUTH DAILY    atorvastatin (LIPITOR) 10 MG tablet TAKE 1 TABLET(10 MG) BY MOUTH AT BEDTIME    benzonatate (TESSALON PERLES) 100 MG capsule Take 1 capsule (100 mg total) by mouth every 6 (six) hours as needed.    Blood Glucose Monitoring Suppl (ONETOUCH VERIO) w/Device KIT Use as directed to check 1 time per day dx: e11.65    calcium gluconate 500 MG tablet Take 1 tablet (500 mg total) by mouth 3 (three) times daily.    CAPLYTA 42 MG CAPS Take 42 mg by mouth at bedtime.     clonazePAM (KLONOPIN) 1 MG tablet Take 1 mg by mouth 3 (three) times daily.    Cyanocobalamin (B-12) 500 MCG SUBL One SL QD    dexlansoprazole (DEXILANT) 60 MG capsule Take 1 capsule (60 mg total) by mouth daily.    diclofenac Sodium (VOLTAREN) 1 % GEL APPLY 4 GRAMS TO THE AFFECTED AREA FOUR TIMES DAILY    docusate sodium (COLACE) 50 MG capsule Take 50 mg by mouth daily.     FLOVENT HFA 110 MCG/ACT inhaler INHALE 2 PUFFS INTO THE LUNGS EVERY 12 HOURS    Glucagon (GVOKE HYPOPEN 2-PACK) 0.5 MG/0.1ML SOAJ Inject 1 each into the skin as needed.    glucose blood (ACCU-CHEK GUIDE) test strip USE TO TEST TWICE DAILY AS DIRECTED    hydrochlorothiazide (HYDRODIURIL) 12.5 MG tablet TAKE 1 TABLET(12.5 MG) BY MOUTH  DAILY    HYDROmorphone (DILAUDID) 4 MG tablet Take 4 mg by mouth 4 (four) times daily as needed for severe pain.    hydrOXYzine (ATARAX/VISTARIL) 25 MG tablet Take 25 mg by mouth in the morning, at noon, in the evening, and at bedtime.    Insulin Pen Needle (PEN NEEDLES) 32G X 4 MM MISC Use as directed with tresiba    lidocaine (XYLOCAINE) 2 % solution Use as directed 15 mLs in the mouth or throat 3 (three) times a week.     Lidocaine 4 % PTCH Place 1 patch onto the skin in the morning and at bedtime.    LORazepam (ATIVAN) 2 MG tablet Take 2 mg by mouth 4 (four) times daily as needed for  anxiety.     losartan (COZAAR) 25 MG tablet Take 1 tablet (25 mg total) by mouth daily.    meclizine (ANTIVERT) 12.5 MG tablet Take 1 tablet (12.5 mg total) by mouth 3 (three) times daily as needed for dizziness.    Multiple Vitamin (MULTIVITAMIN WITH MINERALS) TABS tablet Take 1 tablet by mouth daily.     NARCAN 4 MG/0.1ML LIQD nasal spray kit Place 1 spray into the nose as directed.    ondansetron (ZOFRAN) 4 MG tablet Take 4 mg by mouth as needed for nausea.     OneTouch Delica Lancets 21K MISC Use as directed to check 1 time per day dx: e11.65    ONETOUCH VERIO test strip Use as directed to check 1 time per day dx: e11.65    PAIN MANAGEMENT INTRATHECAL, IT, PUMP 1 each by Intrathecal route continuous. Intrathecal (IT) medication: fentanyl, clonidine, bupivicaine  Fentanyl 242m/bupivicaine150mg/clonidine42mg  51.969mper day, 42m71mer day, 1.42m57m5.4ml/65mn    polyethylene glycol powder (GLYCOLAX/MIRALAX) 17 GM/SCOOP powder MIX 17 GRAMS IN 8 OUNCES OF LIQUID AND DRINK DAILY FOR BOWELS    promethazine (PHENERGAN) 25 MG tablet Take 1 tablet (25 mg total) by mouth every 6 (six) hours as needed for nausea or vomiting.    QUEtiapine (SEROQUEL XR) 400 MG 24 hr tablet Take 400 mg by mouth 2 (two) times daily.    QUEtiapine (SEROQUEL) 300 MG tablet Take 300 mg by mouth at bedtime.    sitaGLIPtin-metformin (JANUMET) 50-1000 MG tablet Take 1 tablet by mouth 2 (two) times daily with a meal.    tiZANidine (ZANAFLEX) 4 MG tablet Take 4 mg by mouth every 4 (four) hours.    traZODone (DESYREL) 100 MG tablet 5 qhs prescribed by psychiatry (Patient taking differently: Take 100-200 mg by mouth at bedtime. 5 qhs prescribed by psychiatry)    TRI-SPRINTEC 0.18/0.215/0.25 MG-35 MCG tablet TAKE 1 TABLET BY MOUTH DAILY    triamcinolone cream (KENALOG) 0.1 % Apply 1 application topically daily as needed (for itching).    TRINTELLIX 20 MG TABS tablet Take 20 mg by mouth daily.    VRAYLAR 6 MG CAPS Take 6 mg by mouth  daily. 06/21/2020: D/C by psychiatrist Lida Shelbie Hutchingpatient    No facility-administered encounter medications on file as of 02/27/2021.    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   Hypokalemia 03/18/2018   Intractable back pain 01/03/2018   HTN (hypertension) 01/03/2018   Type 2 diabetes mellitus without complication ([place]) 56/25/6389   Vitamin D deficiency 07/11/2017   Postmenopausal 04/24/2017   Microalbuminuria 01/14/2017   Dyspareunia in female 12/10/2016   Endometriosis determined by laparoscopy 12/10/2016   History of exploratory laparotomy 12/10/2016   Pelvic pain 12/10/2016   Bipolar 1 disorder ([place]) 09/19/2016   Cyst of right ovary 09/19/2016   Severe episode of recurrent major depressive disorder, with psychotic features ([place]) 09/19/2016   Schizoaffective disorder, bipolar type ([place]) 09/19/2016   Chronic pain 03/05/2016   Non morbid obesity due to excess calories 03/05/2016   S/P lumbar fusion 03/05/2016    Conditions to be addressed/monitored: HTN, DMII, and Hashimoto's Disease ; Transportation  Care Plan : Social Work [place]  Updates made by Daneen Schick since 02/27/2021 12:00 AM     Problem: Barriers to Treatment      Goal: Barriers to Treatment Identified and Managed   Start Date: 02/27/2021  Expected End Date: 04/13/2021  This Visit's Progress: On track  Priority: High  Note:   Current Barriers:  Chronic disease management support and education needs related to HTN, DM, and Hashimoto's Disease   Transportation  Social Worker Clinical Goal(s):   patient will work with SW to identify and address any acute and/or chronic care coordination needs related to the self health management of HTN, DM, and Hashimoto's Disease   patient will work with SW to address concerns related to transportation barriers for physician appointments  SW Interventions:  Inter-disciplinary care team collaboration (see longitudinal plan of care) Collaboration with [name], [place] regarding development and update of comprehensive plan of care as evidenced by provider attestation and co-signature Collaboration with [place] department to discuss the patients ride has been arranged for Thursday 8.11 to see Dr. Benson Norway Discussed Dr. Benson Norway is not in the [place] will cover the cost for this trip Inbound call received from the patient Advised the patient SW has arranged transportation for [place] appointment - patient is aware of pick up time for 11:30 am Educated the patient on the [employer] program advising this program only transports within the [employer] and she will not be able to utilize for future appointments with Dr. Benson Norway - patient stated understanding Provided verbal and written education on the patients [employer] transportation benefit Reviewed the need to schedule rides through [place] at least 3 business days in advance Mailed the patient information on how to access [place] transportation benefit Scheduled follow up call over the next 30 days to confirm receipt of mailing  Patient Goals/Self-Care Activities patient will:   -  Review mailed resource -Contact SW as needed prior to next scheduled call  Follow Up Plan: The care management team will reach out to the patient again over the next 30 days.        Follow Up Plan: SW will follow up with patient by phone over the next month      Daneen Schick, BSW, CDP Social Worker, Certified Dementia Practitioner [place] [facility] [phone]

## 2021-02-27 NOTE — Patient Instructions (Signed)
Social Worker Visit Information  Goals we discussed today:   Goals Addressed             This Visit's Progress    Barriers to Treatment Identified and Managed       Timeframe:  Short-Term Goal Priority:  High Start Date:  8.9.22                           Expected End Date: 9.23.22                       Next planned outreach: 9.7.22  Patient Goals/Self-Care Activities patient will:   - Review mailed resource -Contact SW as needed prior to next scheduled call         Materials Provided: Yes: provided verbal and written education on health plan transportation benefit  Follow Up Plan: SW will follow up with patient by phone over the next month   Daneen Schick, BSW, CDP Social Worker, Certified Dementia Practitioner [place] [facility] [place] [facility] [phone]

## 2021-03-02 NOTE — Chronic Care Management (AMB) (Signed)
Chronic Care Management   CCM RN Visit Note  02/26/2021 Name: Linda Mercado MRN: [id number] DOB: 12-18-1965  Subjective: Linda Mercado is a 55 y.o. year old female who is a primary care patient of [name], [place]. The care management team was consulted for assistance with disease management and care coordination needs.    Engaged with patient by telephone for follow up visit in response to provider referral for case management and/or care coordination services.   Consent to Services:  The patient was given information about Chronic Care Management services, agreed to services, and gave verbal consent prior to initiation of services.  Please see initial visit note for detailed documentation.   Patient agreed to services and verbal consent obtained.   Assessment: Review of patient past medical history, allergies, medications, health status, including review of consultants reports, laboratory and other test data, was performed as part of comprehensive evaluation and provision of chronic care management services.   SDOH (Social Determinants of Health) assessments and interventions performed:  Yes, see care plan   CCM Care Plan  Allergies  Allergen Reactions   Lisinopril Hives    Other reaction(s): anaphylaxis/angioedema   Latex Hives   Morphine And Related     Itching, skin get hot.    Adhesive [Tape] Rash    Outpatient Encounter Medications as of 02/26/2021  Medication Sig Note   albuterol (VENTOLIN HFA) 108 (90 Base) MCG/ACT inhaler Inhale 2 puffs into the lungs every 6 (six) hours as needed for wheezing or shortness of breath.    Alcohol Swabs PADS Use as directed to check blood sugars    AMITIZA 24 MCG capsule Take 24 mcg by mouth 2 (two) times daily.     atenolol (TENORMIN) 100 MG tablet TAKE 1 TABLET(100 MG) BY MOUTH DAILY    atorvastatin (LIPITOR) 10 MG tablet TAKE 1 TABLET(10 MG) BY MOUTH AT BEDTIME    benzonatate (TESSALON PERLES) 100 MG capsule Take 1 capsule (100 mg  total) by mouth every 6 (six) hours as needed.    Blood Glucose Monitoring Suppl (ONETOUCH VERIO) w/Device KIT Use as directed to check 1 time per day dx: e11.65    calcium gluconate 500 MG tablet Take 1 tablet (500 mg total) by mouth 3 (three) times daily.    CAPLYTA 42 MG CAPS Take 42 mg by mouth at bedtime.     clonazePAM (KLONOPIN) 1 MG tablet Take 1 mg by mouth 3 (three) times daily.    Cyanocobalamin (B-12) 500 MCG SUBL One SL QD    dexlansoprazole (DEXILANT) 60 MG capsule Take 1 capsule (60 mg total) by mouth daily.    diclofenac Sodium (VOLTAREN) 1 % GEL APPLY 4 GRAMS TO THE AFFECTED AREA FOUR TIMES DAILY    docusate sodium (COLACE) 50 MG capsule Take 50 mg by mouth daily.     FLOVENT HFA 110 MCG/ACT inhaler INHALE 2 PUFFS INTO THE LUNGS EVERY 12 HOURS    Glucagon (GVOKE HYPOPEN 2-PACK) 0.5 MG/0.1ML SOAJ Inject 1 each into the skin as needed.    glucose blood (ACCU-CHEK GUIDE) test strip USE TO TEST TWICE DAILY AS DIRECTED    hydrochlorothiazide (HYDRODIURIL) 12.5 MG tablet TAKE 1 TABLET(12.5 MG) BY MOUTH DAILY    HYDROmorphone (DILAUDID) 4 MG tablet Take 4 mg by mouth 4 (four) times daily as needed for severe pain.    hydrOXYzine (ATARAX/VISTARIL) 25 MG tablet Take 25 mg by mouth in the morning, at noon, in the evening, and at bedtime.  Insulin Pen Needle (PEN NEEDLES) 32G X 4 MM MISC Use as directed with tresiba    lidocaine (XYLOCAINE) 2 % solution Use as directed 15 mLs in the mouth or throat 3 (three) times a week.     Lidocaine 4 % PTCH Place 1 patch onto the skin in the morning and at bedtime.    LORazepam (ATIVAN) 2 MG tablet Take 2 mg by mouth 4 (four) times daily as needed for anxiety.     losartan (COZAAR) 25 MG tablet Take 1 tablet (25 mg total) by mouth daily.    meclizine (ANTIVERT) 12.5 MG tablet Take 1 tablet (12.5 mg total) by mouth 3 (three) times daily as needed for dizziness.    Multiple Vitamin (MULTIVITAMIN WITH MINERALS) TABS tablet Take 1 tablet by mouth daily.      NARCAN 4 MG/0.1ML LIQD nasal spray kit Place 1 spray into the nose as directed.    ondansetron (ZOFRAN) 4 MG tablet Take 4 mg by mouth as needed for nausea.     OneTouch Delica Lancets 62X MISC Use as directed to check 1 time per day dx: e11.65    ONETOUCH VERIO test strip Use as directed to check 1 time per day dx: e11.65    PAIN MANAGEMENT INTRATHECAL, IT, PUMP 1 each by Intrathecal route continuous. Intrathecal (IT) medication: fentanyl, clonidine, bupivicaine  Fentanyl 251m/bupivicaine150mg/clonidine72mg  51.911mper day, 72m46mer day, 1.72m70m5.4ml/60mn    polyethylene glycol powder (GLYCOLAX/MIRALAX) 17 GM/SCOOP powder MIX 17 GRAMS IN 8 OUNCES OF LIQUID AND DRINK DAILY FOR BOWELS    promethazine (PHENERGAN) 25 MG tablet Take 1 tablet (25 mg total) by mouth every 6 (six) hours as needed for nausea or vomiting.    QUEtiapine (SEROQUEL XR) 400 MG 24 hr tablet Take 400 mg by mouth 2 (two) times daily.    QUEtiapine (SEROQUEL) 300 MG tablet Take 300 mg by mouth at bedtime.    sitaGLIPtin-metformin (JANUMET) 50-1000 MG tablet Take 1 tablet by mouth 2 (two) times daily with a meal.    tiZANidine (ZANAFLEX) 4 MG tablet Take 4 mg by mouth every 4 (four) hours.    traZODone (DESYREL) 100 MG tablet 5 qhs prescribed by psychiatry (Patient taking differently: Take 100-200 mg by mouth at bedtime. 5 qhs prescribed by psychiatry)    TRI-SPRINTEC 0.18/0.215/0.25 MG-35 MCG tablet TAKE 1 TABLET BY MOUTH DAILY    triamcinolone cream (KENALOG) 0.1 % Apply 1 application topically daily as needed (for itching).    TRINTELLIX 20 MG TABS tablet Take 20 mg by mouth daily.    VRAYLAR 6 MG CAPS Take 6 mg by mouth daily. 06/21/2020: D/C by psychiatrist Lida Shelbie Hutchingpatient    No facility-administered encounter medications on file as of 02/26/2021.    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   Hypokalemia 03/18/2018   Intractable back pain 01/03/2018   HTN (hypertension) 01/03/2018   Type 2 diabetes mellitus without complication (HCC) [place]1552/84/1324tamin D deficiency 07/11/2017   Postmenopausal 04/24/2017   Microalbuminuria 01/14/2017   Dyspareunia in female  12/10/2016   Endometriosis determined by laparoscopy 12/10/2016   History of exploratory laparotomy 12/10/2016   Pelvic pain 12/10/2016   Bipolar 1 disorder ([place]) 09/19/2016   Cyst of right ovary 09/19/2016   Severe episode of recurrent major depressive disorder, 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/19/2016   Schizoaffective disorder, bipolar type ([place]) 09/19/2016   Chronic pain 03/05/2016   Non morbid obesity due to excess calories 03/05/2016   S/P lumbar fusion 03/05/2016    Conditions to be addressed/monitored: Type 2 diabetes Mellitus, Essential hypertension, Chronic pain syndrome, Hashimoto's disease, Lumbar radiculopathy, GERD   Care Plan : Chronic Pain (Adult)  Updates made by Lynne Barco, RN since 02/26/2021 12:00 AM     Problem: Pain Management Plan (Chronic Pain)   Priority: High     Long-Range Goal: Pain Management Plan Developed   Start Date: 09/21/2020  Expected End Date: 09/21/2021  Recent Progress: On track  Priority: High  Note:   CARE PLAN ENTRY (see longitudinal plan of care for additional care plan information)  Current Barriers:  Knowledge Deficits related to disease process and Self Health management of Chronic Pain  Chronic Disease Management support and education needs related to Type 2 diabetes Mellitus, Essential hypertension, Chronic pain syndrome, Hashimoto's disease, Lumbar radiculopathy Lacks  caregiver support Transportation barriers Lacks social connections Unable to perform ADLs independently Unable to perform IADLs independently Nurse Case Manager Clinical Goal(s):  Patient will work with the CCM team and PCP to address needs related to disease education and support for improved Self Health management of Chronic Pain Syndrome  CCM RN CM Interventions:  02/26/21 completed successful outbound call with patent  Inter-disciplinary care team collaboration (see longitudinal plan of care) Evaluation of current treatment plan related to Chronic Pain Syndrome and patient's adherence to plan as established by provider Review of patient status, including review of consultant's reports, relevant laboratory and other test results, and medications completed. Reviewed medications with patient and discussed importance of medication adherence Determined patient continues to have her Chronic pain managed by Dr. Cherly Anderson with [facility] with pump refills scheduled every 60 days Discussed patient continues to have difficulty with ambulation and uses her walker at all times inside her home, she denies having recent falls Educated on fall prevention and non-pharmacological ways to manage/cope with chronic pain  Discussed plans with patient for ongoing care management follow up and provided patient with direct contact information for care management team Patient Self Care Activities:  - learn how to meditate - learn relaxation techniques - practice acceptance of chronic pain - practice relaxation or meditation daily - spend time with positive people - tell myself I can (not I can't) - use distraction techniques - use relaxation during pain - keep track of prescription refills - plan exercise or activity when pain is best controlled - prioritize tasks for the day - track times pain is worst and when it is best - track what makes the pain worse and what makes it better - work slower and  less intense when having pain  Next Follow Up Date: 06/13/21    Care Plan : Diabetes Type 2 (Adult)  Updates made by Lynne Ensz, RN since 02/26/2021 12:00 AM     Problem: Glycemic Management (Diabetes, Type 2)   Priority: High     Long-Range Goal: Glycemic Management Optimized   Start Date: 09/21/2020  Expected End Date: 09/21/2021  Recent Progress: On track  Priority: High  Note:   Objective:  Lab Results  Component Value Date   HGBA1C 5.9 (H) 02/07/2021   Lab Results  Component Value Date   CREATININE 0.85 02/07/2021   CREATININE 0.95 10/25/2020   CREATININE 1.19 (H) 08/24/2020   Lab Results  Component Value Date   EGFR 81 02/07/2021  Current Barriers:  Knowledge Deficits related to basic Diabetes pathophysiology and self care/management Knowledge Deficits related to medications used for management of diabetes Limited Social Support Lacks social connections Unable to perform ADLs independently Unable to perform IADLs independently Case Manager Clinical Goal(s):  Patient will demonstrate improved adherence to prescribed treatment plan for diabetes self care/management as evidenced by: daily monitoring and recording of CBG  adherence to ADA/ carb modified diet adherence to prescribed medication regimen contacting provider for new or worsened symptoms or questions Interventions:  02/26/21 completed successful outbound call with patient  Collaboration with [name], [place] regarding development and update of comprehensive plan of care as evidenced by provider attestation and co-signature Inter-disciplinary care team collaboration (see longitudinal plan of care) Provided education to patient about basic DM disease process; Educated on dietary and exercise recommendations Review of patient status, including review of consultants reports, relevant laboratory and other test results, and medications completed. Educated on daily glycemic control FBS 80-130, <180 meals   Reviewed medications with patient and discussed importance of medication adherence Mailed printed educational materials related to Diabetes Management  Advised patient, providing education and rationale, to check cbg before meals and at bedtime and record, calling the CCM team and or PCP for findings outside established parameters.   Reviewed scheduled/upcoming provider appointments including: PCP OV scheduled for 06/11/21 @11 :00 AM Discussed plans with patient for ongoing care management follow up and provided patient with direct contact information for care management team Self-Care Activities Self administers oral medications as prescribed Attends all scheduled provider appointments Checks blood sugars as prescribed and utilize hyper and hypoglycemia protocol as needed Adheres to prescribed ADA/carb modified Patient Goals: - adhere to dietary and exercise recommendations   Follow Up Plan: Telephone follow up appointment with care management team member scheduled for: 06/13/21    Care Plan : Wellness (Adult)  Updates made by Lynne Latner, RN since 02/26/2021 12:00 AM     Problem: Gastroesophageal Reflux disease (GERD)   Priority: High     Long-Range Goal: GERD complications prevented or minimized   Start Date: 02/26/2021  Expected End Date: 02/26/2022  This Visit's Progress: On track  Priority: High  Note:   Current Barriers:  Ineffective Self Health Maintenance in a patient with Type 2 diabetes Mellitus, Essential hypertension, Chronic pain syndrome, Hashimoto's disease, Lumbar radiculopathy, GERD  Clinical Goal(s):  Collaboration with [name], FNP regarding development and update of comprehensive plan of care as evidenced by provider attestation and co-signature Inter-disciplinary care team collaboration (see longitudinal plan of care) patient will work with care management team to address care coordination and chronic disease management needs related to Disease  Management Educational Needs Care Coordination Medication Management and Education Medication Reconciliation Psychosocial Support   Interventions:  02/26/21 completed successful outbound call with patient  Evaluation of current treatment plan related to  Gastroesophageal reflux disease , self-management and patient's adherence to plan as established by provider. Collaboration with [name], FNP regarding development and update of comprehensive plan of care as evidenced by provider attestation       and co-signature Inter-disciplinary care team collaboration (see longitudinal plan of care) Discussed currently patient is experiencing laryngitis secondary to GERD being managed by Dr. Benson Norway  Determined patient is scheduled to undergo an esophageal dilation with Dr. Benson Norway on Thursday, 03/01/21 @12  noon  Determined patient needs help with transportation to this appointment Collaborated with embedded BSW Daneen Schick regarding patient's transportation needs Discussed plans with patient for ongoing care management follow up and provided patient with direct contact information for care management team Self Care Activities:  Self administers medications as prescribed Attends all scheduled provider appointments Calls pharmacy for medication refills Calls provider office for new concerns or questions Patient Goals: - keep appointment with Dr. Benson Norway for Esophageal dilation   Follow Up Plan: Telephone follow up appointment with care management team member scheduled for: 06/13/21     Plan:Telephone follow up appointment with care management team member scheduled for:  06/13/21  Barb Merino, RN, BSN, CCM Care Management Coordinator [facility]  Direct Phone: [phone]

## 2021-03-02 NOTE — Patient Instructions (Signed)
Goals Addressed         Other     Cope with Chronic Pain   On track     Timeframe:  Long-Range Goal Priority:  Medium Start Date: 09/21/20                            Expected End Date: 09/21/21                    Follow Up Date: 06/13/21   - learn how to meditate - learn relaxation techniques - practice acceptance of chronic pain - practice relaxation or meditation daily - spend time with positive people - tell myself I can (not I can't) - use distraction techniques - use relaxation during pain    Why is this important?   Stress makes chronic pain feel worse.  Feelings like depression, anxiety, stress and anger can make your body more sensitive to pain.  Learning ways to cope with stress or depression may help you find some relief from the pain.     Notes:       GERD complications prevented or minimized   On track     Timeframe:  Long-Range Goal Priority:  High Start Date: 02/26/21                            Expected End Date: 02/26/22  Next Scheduled follow up: 06/13/21     Self Care Activities:  Self administers medications as prescribed Attends all scheduled provider appointments Calls pharmacy for medication refills Calls provider office for new concerns or questions Patient Goals: - keep appointment with Dr. Benson Norway for Esophageal dilation                       Glycemic Management Optimized   On track     Timeframe:  Long-Range Goal Priority:  High Start Date: 09/21/20                            Expected End Date: 09/21/21  Next Follow Up Call: 06/13/21  - Self administers oral medications as prescribed - Attends all scheduled provider appointments - Checks blood sugars as prescribed and utilize hyper and hypoglycemia protocol as needed - Adheres to prescribed ADA/carb modified

## 2021-03-21 ENCOUNTER — Other Ambulatory Visit: Payer: Self-pay | Admitting: Nurse Practitioner

## 2021-03-21 DIAGNOSIS — E119 Type 2 diabetes mellitus without complications: Secondary | ICD-10-CM

## 2021-03-21 DIAGNOSIS — I10 Essential (primary) hypertension: Secondary | ICD-10-CM

## 2021-03-21 DIAGNOSIS — K219 Gastro-esophageal reflux disease without esophagitis: Secondary | ICD-10-CM

## 2021-03-27 ENCOUNTER — Other Ambulatory Visit: Payer: Self-pay | Admitting: Nurse Practitioner

## 2021-03-28 ENCOUNTER — Encounter: Payer: Self-pay | Admitting: Nurse Practitioner

## 2021-03-28 ENCOUNTER — Ambulatory Visit (INDEPENDENT_AMBULATORY_CARE_PROVIDER_SITE_OTHER): Payer: MEDICARE

## 2021-03-28 DIAGNOSIS — I10 Essential (primary) hypertension: Secondary | ICD-10-CM

## 2021-03-28 DIAGNOSIS — E063 Autoimmune thyroiditis: Secondary | ICD-10-CM

## 2021-03-28 DIAGNOSIS — E119 Type 2 diabetes mellitus without complications: Secondary | ICD-10-CM

## 2021-03-30 NOTE — Chronic Care Management (AMB) (Signed)
Chronic Care Management    Social Work Note  03/30/2021 Name: Linda Mercado MRN: [id number] DOB: 1966-05-31  Linda Mercado is a 55 y.o. year old female who is a primary care patient of [name], [facility]. The CCM team was consulted to assist the patient with chronic disease management and/or care coordination needs related to:  DM II, HTN, and Hashimoto's Disease .   Engaged with patient by telephone for follow up visit in response to provider referral for social work chronic care management and care coordination services.   Consent to Services:  The patient was given information about Chronic Care Management services, agreed to services, and gave verbal consent prior to initiation of services.  Please see initial visit note for detailed documentation.   Patient agreed to services and consent obtained.   Assessment: Review of patient past medical history, allergies, medications, and health status, including review of relevant consultants reports was performed today as part of a comprehensive evaluation and provision of chronic care management and care coordination services.     SDOH (Social Determinants of Health) assessments and interventions performed:    Advanced Directives Status: Not addressed in this encounter.  CCM Care Plan  Allergies  Allergen Reactions   Lisinopril Hives    Other reaction(s): anaphylaxis/angioedema   Latex Hives   Morphine And Related     Itching, skin get hot.    Adhesive [Tape] Rash    Outpatient Encounter Medications as of 03/28/2021  Medication Sig Note   albuterol (VENTOLIN HFA) 108 (90 Base) MCG/ACT inhaler Inhale 2 puffs into the lungs every 6 (six) hours as needed for wheezing or shortness of breath.    Alcohol Swabs PADS Use as directed to check blood sugars    AMITIZA 24 MCG capsule Take 24 mcg by mouth 2 (two) times daily.     atenolol (TENORMIN) 100 MG tablet TAKE 1 TABLET(100 MG) BY MOUTH DAILY    atorvastatin (LIPITOR) 10 MG tablet TAKE 1  TABLET(10 MG) BY MOUTH AT BEDTIME    benzonatate (TESSALON PERLES) 100 MG capsule Take 1 capsule (100 mg total) by mouth every 6 (six) hours as needed.    Blood Glucose Monitoring Suppl (ONETOUCH VERIO) w/Device KIT Use as directed to check 1 time per day dx: e11.65    calcium gluconate 500 MG tablet Take 1 tablet (500 mg total) by mouth 3 (three) times daily.    CAPLYTA 42 MG CAPS Take 42 mg by mouth at bedtime.     clonazePAM (KLONOPIN) 1 MG tablet Take 1 mg by mouth 3 (three) times daily.    Cyanocobalamin (B-12) 500 MCG SUBL One SL QD    dexlansoprazole (DEXILANT) 60 MG capsule TAKE 1 CAPSULE BY MOUTH EVERY DAY    diclofenac Sodium (VOLTAREN) 1 % GEL APPLY 4 GRAMS TO THE AFFECTED AREA FOUR TIMES DAILY    docusate sodium (COLACE) 50 MG capsule Take 50 mg by mouth daily.     FLOVENT HFA 110 MCG/ACT inhaler INHALE 2 PUFFS INTO THE LUNGS EVERY 12 HOURS    Glucagon (GVOKE HYPOPEN 2-PACK) 0.5 MG/0.1ML SOAJ Inject 1 each into the skin as needed.    glucose blood (ACCU-CHEK GUIDE) test strip USE TO TEST TWICE DAILY AS DIRECTED    hydrochlorothiazide (HYDRODIURIL) 12.5 MG tablet TAKE 1 TABLET(12.5 MG) BY MOUTH DAILY    HYDROmorphone (DILAUDID) 4 MG tablet Take 4 mg by mouth 4 (four) times daily as needed for severe pain.    hydrOXYzine (ATARAX/VISTARIL) 25 MG  tablet Take 25 mg by mouth in the morning, at noon, in the evening, and at bedtime.    Insulin Pen Needle (PEN NEEDLES) 32G X 4 MM MISC Use as directed with tresiba    lidocaine (XYLOCAINE) 2 % solution Use as directed 15 mLs in the mouth or throat 3 (three) times a week.     Lidocaine 4 % PTCH Place 1 patch onto the skin in the morning and at bedtime.    LORazepam (ATIVAN) 2 MG tablet Take 2 mg by mouth 4 (four) times daily as needed for anxiety.     losartan (COZAAR) 25 MG tablet Take 1 tablet (25 mg total) by mouth daily.    meclizine (ANTIVERT) 12.5 MG tablet Take 1 tablet (12.5 mg total) by mouth 3 (three) times daily as needed for  dizziness.    Multiple Vitamin (MULTIVITAMIN WITH MINERALS) TABS tablet Take 1 tablet by mouth daily.     NARCAN 4 MG/0.1ML LIQD nasal spray kit Place 1 spray into the nose as directed.    ondansetron (ZOFRAN) 4 MG tablet Take 4 mg by mouth as needed for nausea.     OneTouch Delica Lancets 51I MISC Use as directed to check 1 time per day dx: e11.65    ONETOUCH VERIO test strip Use as directed to check 1 time per day dx: e11.65    PAIN MANAGEMENT INTRATHECAL, IT, PUMP 1 each by Intrathecal route continuous. Intrathecal (IT) medication: fentanyl, clonidine, bupivicaine  Fentanyl 23m/bupivicaine150mg/clonidine53mg  51.937mper day, 53m29mer day, 1.82m27m5.4ml/63mn    polyethylene glycol powder (GLYCOLAX/MIRALAX) 17 GM/SCOOP powder MIX 17 GRAMS IN 8 OUNCES OF LIQUID AND DRINK DAILY FOR BOWELS    promethazine (PHENERGAN) 25 MG tablet Take 1 tablet (25 mg total) by mouth every 6 (six) hours as needed for nausea or vomiting.    QUEtiapine (SEROQUEL XR) 400 MG 24 hr tablet Take 400 mg by mouth 2 (two) times daily.    QUEtiapine (SEROQUEL) 300 MG tablet Take 300 mg by mouth at bedtime.    sitaGLIPtin-metformin (JANUMET) 50-1000 MG tablet Take 1 tablet by mouth 2 (two) times daily with a meal.    tiZANidine (ZANAFLEX) 4 MG tablet Take 4 mg by mouth every 4 (four) hours.    traZODone (DESYREL) 100 MG tablet 5 qhs prescribed by psychiatry (Patient taking differently: Take 100-200 mg by mouth at bedtime. 5 qhs prescribed by psychiatry)    TRI-SPRINTEC 0.18/0.215/0.25 MG-35 MCG tablet TAKE 1 TABLET BY MOUTH DAILY    triamcinolone cream (KENALOG) 0.1 % Apply 1 application topically daily as needed (for itching).    TRINTELLIX 20 MG TABS tablet Take 20 mg by mouth daily.    VRAYLAR 6 MG CAPS Take 6 mg by mouth daily. 06/21/2020: D/C by psychiatrist Lida Shelbie Hutchingpatient    No facility-administered encounter medications on file as of 03/28/2021.    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   Hypokalemia 03/18/2018   Intractable back pain 01/03/2018   HTN (hypertension) 01/03/2018   Type 2 diabetes mellitus without complication (HCC)[place]  01/03/2018   Vitamin D deficiency 07/11/2017   Postmenopausal 04/24/2017   Microalbuminuria 01/14/2017   Dyspareunia in female 12/10/2016   Endometriosis determined by laparoscopy 12/10/2016   History of exploratory laparotomy 12/10/2016   Pelvic pain 12/10/2016   Bipolar 1 disorder ([place]) 09/19/2016   Cyst of right ovary 09/19/2016   Severe episode of recurrent major depressive disorder, with psychotic features ([place]) 09/19/2016   Schizoaffective disorder, bipolar type ([place]) 09/19/2016   Chronic pain 03/05/2016   Non morbid obesity due to excess calories 03/05/2016   S/P lumbar fusion 03/05/2016    Conditions to be addressed/monitored: HTN, DMII, and Hashimoto's Disease ; Transportation and Limited access to caregiver  Care Plan : Social Work [place]  Updates made by Daneen Schick since 03/30/2021 12:00 AM     Problem: Barriers to Treatment      Goal: Barriers to Treatment Identified and Managed   Start Date: 02/27/2021  Expected End Date: 04/13/2021  Recent Progress: On track  Priority: High  Note:   Late entry from 9.7.22 Current Barriers:  Chronic disease management support and education needs related to HTN, DM, and Hashimoto's Disease   Transportation  Social Worker Clinical Goal(s):  patient will work with SW to identify and address any acute and/or chronic care coordination needs related to the self health management of HTN, DM, and  Hashimoto's Disease   patient will work with SW to address concerns related to transportation barriers for physician appointments  SW Interventions:  Inter-disciplinary care team collaboration (see longitudinal plan of care) Collaboration with [name], [place] regarding development and update of comprehensive plan of care as evidenced by provider attestation and co-signature Successful outbound call placed to the patient to assess goal progression Determined the patient has yet to receive mailed resource information regarding how to access [employer] transportation Advised the patient SW would resend information Discussed the patient has an outpatient surgery coming up in [facility] - patient does not have transportation or a caregiver arranged for this procedure Encouraged the patient to speak with her surgeon regarding discharge needs prior to the procedure to make sure she will be safe to return home - patient stated understanding Determined the patient does not currently have a caregiver in the home but is interested in obtaining one Educated the patient on both the CAP and PCS program - patient is interested in [facility] at this time due to the CAP program having a wait list Educated the patient on the process of a PCS application Initiated  PCS application for patient provider to complete and submit to [facility]   Patient Goals/Self-Care Activities patient will:   -  Review mailed resource -Engage with [facility] to complete assessment -Contact SW as needed prior to next scheduled call  Follow Up Plan: The care management team will reach out to the patient again over the next 10 days.        Follow Up Plan: SW will follow up with patient by phone over the next month      Daneen Schick, BSW, CDP Social Worker, Certified Dementia Practitioner [place] [facility] [place] [facility] [phone]

## 2021-03-30 NOTE — Patient Instructions (Signed)
Social Worker Visit Information  Goals we discussed today:   Goals Addressed             This Visit's Progress    Barriers to Treatment Identified and Managed   On track    Timeframe:  Short-Term Goal Priority:  High Start Date:  8.9.22                           Expected End Date: 9.23.22                       Patient Goals/Self-Care Activities patient will:   - Review mailed resource -Engage with [place] to complete assessment -Contact SW as needed prior to next scheduled call         Materials Provided: Yes: verbal and written education on [place] transportation  Follow Up Plan: SW will follow up with patient by phone over the next month   Daneen Schick, BSW, CDP Social Worker, Certified Dementia Practitioner [place] [facility] [place] [facility] [phone]

## 2021-04-04 ENCOUNTER — Telehealth: Payer: Self-pay

## 2021-04-04 NOTE — Chronic Care Management (AMB) (Signed)
Error  [place] Clinical Pharmacist Assistant [phone]

## 2021-04-04 NOTE — Progress Notes (Addendum)
Chronic Care Management Pharmacy Assistant   Name: Linda Mercado  MRN: [id number] DOB: 1966-02-14   Reason for Encounter: Disease State/ Hypertension  Recent office visits:  02-26-2021 Lynne Tobon, RN (CCM)  02-26-2021 Daneen Schick (CCM)  02-27-2021 Daneen Schick (CCM)  03-28-2021 Daneen Schick (CCM)  Recent consult visits:  03-14-2021 Birdie Hopes, Remo Lipps, ANP (Pain medicine). Facet arthropathy lumbar procedure.  Hospital visits:  None in previous 6 months  Medications: Outpatient Encounter Medications as of 04/04/2021  Medication Sig Note   albuterol (VENTOLIN HFA) 108 (90 Base) MCG/ACT inhaler Inhale 2 puffs into the lungs every 6 (six) hours as needed for wheezing or shortness of breath.    Alcohol Swabs PADS Use as directed to check blood sugars    AMITIZA 24 MCG capsule Take 24 mcg by mouth 2 (two) times daily.     atenolol (TENORMIN) 100 MG tablet TAKE 1 TABLET(100 MG) BY MOUTH DAILY    atorvastatin (LIPITOR) 10 MG tablet TAKE 1 TABLET(10 MG) BY MOUTH AT BEDTIME    benzonatate (TESSALON PERLES) 100 MG capsule Take 1 capsule (100 mg total) by mouth every 6 (six) hours as needed.    Blood Glucose Monitoring Suppl (ONETOUCH VERIO) w/Device KIT Use as directed to check 1 time per day dx: e11.65    calcium gluconate 500 MG tablet Take 1 tablet (500 mg total) by mouth 3 (three) times daily.    CAPLYTA 42 MG CAPS Take 42 mg by mouth at bedtime.     clonazePAM (KLONOPIN) 1 MG tablet Take 1 mg by mouth 3 (three) times daily.    Cyanocobalamin (B-12) 500 MCG SUBL One SL QD    dexlansoprazole (DEXILANT) 60 MG capsule TAKE 1 CAPSULE BY MOUTH EVERY DAY    diclofenac Sodium (VOLTAREN) 1 % GEL APPLY 4 GRAMS TO THE AFFECTED AREA FOUR TIMES DAILY    docusate sodium (COLACE) 50 MG capsule Take 50 mg by mouth daily.     FLOVENT HFA 110 MCG/ACT inhaler INHALE 2 PUFFS INTO THE LUNGS EVERY 12 HOURS    Glucagon (GVOKE HYPOPEN 2-PACK) 0.5 MG/0.1ML SOAJ Inject 1 each into the skin  as needed.    glucose blood (ACCU-CHEK GUIDE) test strip USE TO TEST TWICE DAILY AS DIRECTED    hydrochlorothiazide (HYDRODIURIL) 12.5 MG tablet TAKE 1 TABLET(12.5 MG) BY MOUTH DAILY    HYDROmorphone (DILAUDID) 4 MG tablet Take 4 mg by mouth 4 (four) times daily as needed for severe pain.    hydrOXYzine (ATARAX/VISTARIL) 25 MG tablet Take 25 mg by mouth in the morning, at noon, in the evening, and at bedtime.    Insulin Pen Needle (PEN NEEDLES) 32G X 4 MM MISC Use as directed with tresiba    lidocaine (XYLOCAINE) 2 % solution Use as directed 15 mLs in the mouth or throat 3 (three) times a week.     Lidocaine 4 % PTCH Place 1 patch onto the skin in the morning and at bedtime.    LORazepam (ATIVAN) 2 MG tablet Take 2 mg by mouth 4 (four) times daily as needed for anxiety.     losartan (COZAAR) 25 MG tablet Take 1 tablet (25 mg total) by mouth daily.    meclizine (ANTIVERT) 12.5 MG tablet Take 1 tablet (12.5 mg total) by mouth 3 (three) times daily as needed for dizziness.    Multiple Vitamin (MULTIVITAMIN WITH MINERALS) TABS tablet Take 1 tablet by mouth daily.     NARCAN 4 MG/0.1ML LIQD nasal spray kit  Place 1 spray into the nose as directed.    ondansetron (ZOFRAN) 4 MG tablet Take 4 mg by mouth as needed for nausea.     OneTouch Delica Lancets 10Y MISC Use as directed to check 1 time per day dx: e11.65    ONETOUCH VERIO test strip Use as directed to check 1 time per day dx: e11.65    PAIN MANAGEMENT INTRATHECAL, IT, PUMP 1 each by Intrathecal route continuous. Intrathecal (IT) medication: fentanyl, clonidine, bupivicaine  Fentanyl 211m/bupivicaine150mg/clonidine66mg  51.9166mper day, 66m66mer day, 1.49m6m5.4ml/43mn    polyethylene glycol powder (GLYCOLAX/MIRALAX) 17 GM/SCOOP powder MIX 17 GRAMS IN 8 OUNCES OF LIQUID AND DRINK DAILY FOR BOWELS    promethazine (PHENERGAN) 25 MG tablet Take 1 tablet (25 mg total) by mouth every 6 (six) hours as needed for nausea or vomiting.    QUEtiapine  (SEROQUEL XR) 400 MG 24 hr tablet Take 400 mg by mouth 2 (two) times daily.    QUEtiapine (SEROQUEL) 300 MG tablet Take 300 mg by mouth at bedtime.    sitaGLIPtin-metformin (JANUMET) 50-1000 MG tablet Take 1 tablet by mouth 2 (two) times daily with a meal.    tiZANidine (ZANAFLEX) 4 MG tablet Take 4 mg by mouth every 4 (four) hours.    traZODone (DESYREL) 100 MG tablet 5 qhs prescribed by psychiatry (Patient taking differently: Take 100-200 mg by mouth at bedtime. 5 qhs prescribed by psychiatry)    TRI-SPRINTEC 0.18/0.215/0.25 MG-35 MCG tablet TAKE 1 TABLET BY MOUTH DAILY    triamcinolone cream (KENALOG) 0.1 % Apply 1 application topically daily as needed (for itching).    TRINTELLIX 20 MG TABS tablet Take 20 mg by mouth daily.    VRAYLAR 6 MG CAPS Take 6 mg by mouth daily. 06/21/2020: D/C by psychiatrist Lida Shelbie Hutchingpatient    No facility-administered encounter medications on file as of 04/04/2021.   Reviewed chart prior to disease state call. Spoke with patient regarding BP  Recent Office Vitals: BP Readings from Last 3 Encounters:  02/07/21 128/74  09/13/20 (!) 142/80  08/30/20 124/80   Pulse Readings from Last 3 Encounters:  02/07/21 79  09/13/20 80  08/30/20 76    Wt Readings from Last 3 Encounters:  02/07/21 148 lb 9.6 oz (67.4 kg)  10/25/20 155 lb 6.4 oz (70.5 kg)  09/13/20 164 lb (74.4 kg)     Kidney Function Lab Results  Component Value Date/Time   CREATININE 0.85 02/07/2021 11:36 AM   CREATININE 0.95 10/25/2020 01:33 PM   GFRNONAA 52 (L) 08/24/2020 03:51 PM   GFRAA 60 08/24/2020 03:51 PM    BMP Latest Ref Rng & Units 02/07/2021 10/25/2020 08/24/2020  Glucose 65 - 99 mg/dL 111(H) 68 333(H)  BUN 6 - 24 mg/dL _0 Creatinine 0.57 - 1.00 mg/dL 0.85 0.95 1.19(H)  BUN/Creat Ratio 9 - _1 8(L)  Sodium 134 - 144 mmol/L 140 143 134  Potassium 3.5 - 5.2 mmol/L 3.8 3.4(L) 4.4  Chloride 96 - 106 mmol/L 99 99 92(L)  CO2 20 - 29 mmol/L _2 Calcium 8.7 - 10.2  mg/dL 10.0 9.5 9.9    Current antihypertensive regimen:  Losartan 50 mg daily Atenolol 100 mg daily   Hydrochlorothiazide 12.5 mg daily  How often are you checking your Blood Pressure? 3-5x per week  Current home BP readings: 104/76  What recent interventions/DTPs have been made by any provider to improve Blood Pressure control since last CPP Visit:  Patient  states she takes medications as directed.  Any recent hospitalizations or ED visits since last visit with CPP? No  What diet changes have been made to improve Blood Pressure Control?  Patient states she has limited her salt intake but doesn't have much of an appetite.  What exercise is being done to improve your Blood Pressure Control?  Patient states she tries to clean up around the house some but doesn't exercise any.    Adherence Review: Is the patient currently on ACE/ARB medication? Yes Does the patient have >5 day gap between last estimated fill dates? Yes    04-04-2021: Kasandra Knudsen, No answer, left message of appointment on 04-05-2021 at 3:00 via telephone visit with Orlando Penner, Pharm D. Notified to have all medications, supplements, blood pressure and/or blood sugar logs available during appointment and to return call if need to reschedule.  NOTES: Rescheduled patient's appointment on 04-05-2021 to October.   Care Gaps: Pneumococcal Vaccine overdue Medicare wellness 09-19-2021  Star Rating Drug: Losartan 50 mg- Last filled 01-30-2021 [facility] Atenolol 100 mg- Last filled 02-28-2021 90 DS [facility] Atorvastatin 10 mg- Last filled 02-14-2021 90 [facility] Janumet 50-1000 mg- Last filled 02-16-2021 [facility]  [place] Clinical Pharmacist Assistant [phone]

## 2021-04-05 ENCOUNTER — Telehealth: Payer: Self-pay

## 2021-04-11 ENCOUNTER — Telehealth: Payer: Self-pay

## 2021-04-11 NOTE — Telephone Encounter (Signed)
  Care Management   Follow Up Note   04/11/2021 Name: Linda Mercado MRN: [id number] DOB: [birth year]   Referred by: [name], FNP Reason for referral : Chronic Care Management (Unsuccessful call)   An unsuccessful telephone outreach was attempted today. The patient was referred to the case management team for assistance with care management and care coordination. SW left a HIPAA compliant voice message requesting a return call.  Follow Up Plan: The care management team will reach out to the patient again over the next 10 days.   Daneen Schick, BSW, CDP Social Worker, Certified Dementia Practitioner [place] / [place] [facility] [phone]

## 2021-04-14 ENCOUNTER — Other Ambulatory Visit: Payer: Self-pay | Admitting: Nurse Practitioner

## 2021-04-17 ENCOUNTER — Telehealth: Payer: MEDICARE

## 2021-04-17 ENCOUNTER — Other Ambulatory Visit: Payer: Self-pay

## 2021-04-17 ENCOUNTER — Ambulatory Visit: Payer: MEDICARE

## 2021-04-17 DIAGNOSIS — E119 Type 2 diabetes mellitus without complications: Secondary | ICD-10-CM

## 2021-04-17 DIAGNOSIS — I10 Essential (primary) hypertension: Secondary | ICD-10-CM

## 2021-04-17 DIAGNOSIS — E063 Autoimmune thyroiditis: Secondary | ICD-10-CM

## 2021-04-17 MED ORDER — MECLIZINE HCL 12.5 MG PO TABS: 12.5000 mg | ORAL_TABLET | Freq: Three times a day (TID) | ORAL | 0 refills | Status: DC | PRN

## 2021-04-17 MED ORDER — NORGESTIM-ETH ESTRAD TRIPHASIC 0.18/0.215/0.25 MG-35 MCG PO TABS: 1.0000 | ORAL_TABLET | Freq: Every day | ORAL | 1 refills | Status: DC

## 2021-04-17 NOTE — Patient Instructions (Signed)
Social Worker Visit Information  Goals we discussed today:   Goals Addressed             This Visit's Progress    Barriers to Treatment Identified and Managed       Timeframe:  Short-Term Goal Priority:  High Start Date:  8.9.22                           Expected End Date: 9.23.22             Next planned outreach: 10.3.22            Patient Goals/Self-Care Activities patient will:   - Review mailed resource -Engage with [place] to complete assessment -Contact SW as needed prior to next scheduled call         Patient verbalizes understanding of instructions provided today and agrees to view in [place].   Follow Up Plan: SW will follow up with patient by phone over the next 10 days   Daneen Schick, BSW, CDP Social Worker, Certified Dementia Practitioner [place] [facility] [place] [facility] [phone]

## 2021-04-17 NOTE — Chronic Care Management (AMB) (Signed)
Chronic Care Management    Social Work Note  04/17/2021 Name: Linda Mercado MRN: [id number] DOB: 1965/11/05  Linda Mercado is a 55 y.o. year old female who is a primary care patient of [name], [place]. The CCM team was consulted to assist the patient with chronic disease management and/or care coordination needs related to:  DM II, HTN, and Hashimoto's Disease .   Engaged with patient by telephone for follow up visit in response to provider referral for social work chronic care management and care coordination services.   Consent to Services:  The patient was given information about Chronic Care Management services, agreed to services, and gave verbal consent prior to initiation of services.  Please see initial visit note for detailed documentation.   Patient agreed to services and consent obtained.   Assessment: Review of patient past medical history, allergies, medications, and health status, including review of relevant consultants reports was performed today as part of a comprehensive evaluation and provision of chronic care management and care coordination services.     SDOH (Social Determinants of Health) assessments and interventions performed:    Advanced Directives Status: Not addressed in this encounter.  CCM Care Plan  Allergies  Allergen Reactions   Lisinopril Hives    Other reaction(s): anaphylaxis/angioedema   Latex Hives   Morphine And Related     Itching, skin get hot.    Adhesive [Tape] Rash    Outpatient Encounter Medications as of 04/17/2021  Medication Sig Note   albuterol (VENTOLIN HFA) 108 (90 Base) MCG/ACT inhaler Inhale 2 puffs into the lungs every 6 (six) hours as needed for wheezing or shortness of breath.    Alcohol Swabs PADS Use as directed to check blood sugars    AMITIZA 24 MCG capsule Take 24 mcg by mouth 2 (two) times daily.     atenolol (TENORMIN) 100 MG tablet TAKE 1 TABLET(100 MG) BY MOUTH DAILY    atorvastatin (LIPITOR) 10 MG tablet TAKE  1 TABLET(10 MG) BY MOUTH AT BEDTIME    benzonatate (TESSALON PERLES) 100 MG capsule Take 1 capsule (100 mg total) by mouth every 6 (six) hours as needed.    Blood Glucose Monitoring Suppl (ONETOUCH VERIO) w/Device KIT Use as directed to check 1 time per day dx: e11.65    calcium gluconate 500 MG tablet Take 1 tablet (500 mg total) by mouth 3 (three) times daily.    CAPLYTA 42 MG CAPS Take 42 mg by mouth at bedtime.     clonazePAM (KLONOPIN) 1 MG tablet Take 1 mg by mouth 3 (three) times daily.    Cyanocobalamin (B-12) 500 MCG SUBL One SL QD    dexlansoprazole (DEXILANT) 60 MG capsule TAKE 1 CAPSULE BY MOUTH EVERY DAY    diclofenac Sodium (VOLTAREN) 1 % GEL APPLY 4 GRAMS TO THE AFFECTED AREA FOUR TIMES DAILY    docusate sodium (COLACE) 50 MG capsule Take 50 mg by mouth daily.     FLOVENT HFA 110 MCG/ACT inhaler INHALE 2 PUFFS INTO THE LUNGS EVERY 12 HOURS    Glucagon (GVOKE HYPOPEN 2-PACK) 0.5 MG/0.1ML SOAJ Inject 1 each into the skin as needed.    glucose blood (ACCU-CHEK GUIDE) test strip USE TO TEST TWICE DAILY AS DIRECTED    hydrochlorothiazide (HYDRODIURIL) 12.5 MG tablet TAKE 1 TABLET(12.5 MG) BY MOUTH DAILY    HYDROmorphone (DILAUDID) 4 MG tablet Take 4 mg by mouth 4 (four) times daily as needed for severe pain.    hydrOXYzine (ATARAX/VISTARIL) 25 MG  tablet Take 25 mg by mouth in the morning, at noon, in the evening, and at bedtime.    Insulin Pen Needle (PEN NEEDLES) 32G X 4 MM MISC Use as directed with tresiba    lidocaine (XYLOCAINE) 2 % solution Use as directed 15 mLs in the mouth or throat 3 (three) times a week.     Lidocaine 4 % PTCH Place 1 patch onto the skin in the morning and at bedtime.    LORazepam (ATIVAN) 2 MG tablet Take 2 mg by mouth 4 (four) times daily as needed for anxiety.     losartan (COZAAR) 25 MG tablet Take 1 tablet (25 mg total) by mouth daily.    meclizine (ANTIVERT) 12.5 MG tablet Take 1 tablet (12.5 mg total) by mouth 3 (three) times daily as needed for  dizziness.    Multiple Vitamin (MULTIVITAMIN WITH MINERALS) TABS tablet Take 1 tablet by mouth daily.     NARCAN 4 MG/0.1ML LIQD nasal spray kit Place 1 spray into the nose as directed.    ondansetron (ZOFRAN) 4 MG tablet Take 4 mg by mouth as needed for nausea.     OneTouch Delica Lancets 27P MISC Use as directed to check 1 time per day dx: e11.65    ONETOUCH VERIO test strip Use as directed to check 1 time per day dx: e11.65    PAIN MANAGEMENT INTRATHECAL, IT, PUMP 1 each by Intrathecal route continuous. Intrathecal (IT) medication: fentanyl, clonidine, bupivicaine  Fentanyl 2353m/bupivicaine150mg/clonidine53mg  51.939mper day, 53m87mer day, 1.47m65m5.4ml/41mn    polyethylene glycol powder (GLYCOLAX/MIRALAX) 17 GM/SCOOP powder MIX 17 GRAMS IN 8 OUNCES OF LIQUID AND DRINK DAILY FOR BOWELS    promethazine (PHENERGAN) 25 MG tablet Take 1 tablet (25 mg total) by mouth every 6 (six) hours as needed for nausea or vomiting.    QUEtiapine (SEROQUEL XR) 400 MG 24 hr tablet Take 400 mg by mouth 2 (two) times daily.    QUEtiapine (SEROQUEL) 300 MG tablet Take 300 mg by mouth at bedtime.    sitaGLIPtin-metformin (JANUMET) 50-1000 MG tablet Take 1 tablet by mouth 2 (two) times daily with a meal.    tiZANidine (ZANAFLEX) 4 MG tablet Take 4 mg by mouth every 4 (four) hours.    traZODone (DESYREL) 100 MG tablet 5 qhs prescribed by psychiatry (Patient taking differently: Take 100-200 mg by mouth at bedtime. 5 qhs prescribed by psychiatry)    TRI-SPRINTEC 0.18/0.215/0.25 MG-35 MCG tablet TAKE 1 TABLET BY MOUTH DAILY    triamcinolone cream (KENALOG) 0.1 % Apply 1 application topically daily as needed (for itching).    TRINTELLIX 20 MG TABS tablet Take 20 mg by mouth daily.    VRAYLAR 6 MG CAPS Take 6 mg by mouth daily. 06/21/2020: D/C by psychiatrist Lida Shelbie Hutchingpatient    No facility-administered encounter medications on file as of 04/17/2021.    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   Hypokalemia 03/18/2018   Intractable back pain 01/03/2018   HTN (hypertension) 01/03/2018   Type 2 diabetes mellitus without complication (HCC)[place]  01/03/2018   Vitamin D deficiency 07/11/2017   Postmenopausal 04/24/2017   Microalbuminuria 01/14/2017   Dyspareunia in female 12/10/2016   Endometriosis determined by laparoscopy 12/10/2016   History of exploratory laparotomy 12/10/2016   Pelvic pain 12/10/2016   Bipolar 1 disorder ([place]) 09/19/2016   Cyst of right ovary 09/19/2016   Severe episode of recurrent major depressive disorder, with psychotic features ([place]) 09/19/2016   Schizoaffective disorder, bipolar type ([place]) 09/19/2016   Chronic pain 03/05/2016   Non morbid obesity due to excess calories 03/05/2016   S/P lumbar fusion 03/05/2016    Conditions to be addressed/monitored: HTN, DMII, and Hashimoto's Disease ; ADL IADL limitations  Care Plan : Social Work [place]  Updates made by Daneen Schick since 04/17/2021 12:00 AM     Problem: Barriers to Treatment      Goal: Barriers to Treatment Identified and Managed   Start Date: 02/27/2021  Expected End Date: 04/13/2021  Recent Progress: On track  Priority: High  Note:   Late entry from 9.7.22 Current Barriers:  Chronic disease management support and education needs related to HTN, DM, and Hashimoto's Disease   Transportation  Social Worker Clinical Goal(s):  patient will work with SW to identify and address any acute and/or chronic care coordination needs related to the self health management of HTN, DM, and Hashimoto's Disease    patient will work with SW to address concerns related to transportation barriers for physician appointments  SW Interventions:  Inter-disciplinary care team collaboration (see longitudinal plan of care) Collaboration with [name], [place] regarding development and update of comprehensive plan of care as evidenced by provider attestation and co-signature Successful outbound call placed to the patient to assess goal progression Determined the patient has yet to hear from [facility] to schedule an assessment for PCS Discussed plans for SW to contact [facility] to assess progression of application Successful outbound call placed to [facility] with [facility] who reports an application has yet to be received Collaboration with Virl Diamond, CMA to request the patients PCS application be resent to [place] will follow up with the patient over the next 10 days  Patient Goals/Self-Care Activities patient will:   -  Review mailed resource -Engage with [facility] to complete assessment -Contact SW as needed prior to next scheduled call  Follow Up Plan: The care management team will reach out to the patient again over the next 10 days.        Follow Up Plan: SW will follow up with patient by phone over the next 10 days      Daneen Schick, BSW, CDP Social Worker, Certified Dementia Practitioner [place] [facility] [place] [facility] [phone]

## 2021-04-18 ENCOUNTER — Telehealth: Payer: MEDICARE

## 2021-04-20 DIAGNOSIS — I10 Essential (primary) hypertension: Secondary | ICD-10-CM | POA: Diagnosis not present

## 2021-04-20 DIAGNOSIS — E119 Type 2 diabetes mellitus without complications: Secondary | ICD-10-CM

## 2021-04-23 ENCOUNTER — Telehealth: Payer: Self-pay

## 2021-04-23 ENCOUNTER — Telehealth: Payer: MEDICARE

## 2021-04-23 NOTE — Telephone Encounter (Signed)
  Care Management   Follow Up Note   04/23/2021 Name: Linda Mercado MRN: [id number] DOB: 1965/11/27   Referred by: [name], FNP Reason for referral : Chronic Care Management (Unsuccessful call)   An unsuccessful telephone outreach was attempted today. The patient was referred to the case management team for assistance with care management and care coordination. SW left a HIPAA compliant voice message requesting a return call.  Follow Up Plan: The care management team will reach out to the patient again over the next 14 days.   Daneen Schick, BSW, CDP Social Worker, Certified Dementia Practitioner [place] [facility] [place] [facility] [phone]

## 2021-04-26 ENCOUNTER — Ambulatory Visit: Payer: MEDICARE | Admitting: Nurse Practitioner

## 2021-04-30 ENCOUNTER — Encounter (HOSPITAL_COMMUNITY): Payer: Self-pay

## 2021-04-30 ENCOUNTER — Emergency Department (HOSPITAL_COMMUNITY): Payer: Medicare HMO

## 2021-04-30 ENCOUNTER — Emergency Department (HOSPITAL_COMMUNITY)
Admission: EM | Admit: 2021-04-30 | Discharge: 2021-04-30 | Disposition: A | Discharge status: Home / Self Care | Payer: Medicare HMO | Attending: Emergency Medicine | Admitting: Emergency Medicine

## 2021-04-30 DIAGNOSIS — E119 Type 2 diabetes mellitus without complications: Secondary | ICD-10-CM | POA: Diagnosis not present

## 2021-04-30 DIAGNOSIS — L500 Allergic urticaria: Secondary | ICD-10-CM | POA: Insufficient documentation

## 2021-04-30 DIAGNOSIS — T402X5A Adverse effect of other opioids, initial encounter: Secondary | ICD-10-CM | POA: Diagnosis not present

## 2021-04-30 DIAGNOSIS — G894 Chronic pain syndrome: Secondary | ICD-10-CM

## 2021-04-30 DIAGNOSIS — M549 Dorsalgia, unspecified: Secondary | ICD-10-CM | POA: Diagnosis not present

## 2021-04-30 DIAGNOSIS — E063 Autoimmune thyroiditis: Secondary | ICD-10-CM | POA: Diagnosis not present

## 2021-04-30 DIAGNOSIS — Z79899 Other long term (current) drug therapy: Secondary | ICD-10-CM | POA: Diagnosis not present

## 2021-04-30 DIAGNOSIS — R109 Unspecified abdominal pain: Secondary | ICD-10-CM

## 2021-04-30 DIAGNOSIS — R1084 Generalized abdominal pain: Secondary | ICD-10-CM | POA: Insufficient documentation

## 2021-04-30 DIAGNOSIS — G8929 Other chronic pain: Secondary | ICD-10-CM | POA: Insufficient documentation

## 2021-04-30 DIAGNOSIS — I10 Essential (primary) hypertension: Secondary | ICD-10-CM | POA: Insufficient documentation

## 2021-04-30 DIAGNOSIS — R112 Nausea with vomiting, unspecified: Secondary | ICD-10-CM | POA: Insufficient documentation

## 2021-04-30 DIAGNOSIS — Z91040 Latex allergy status: Secondary | ICD-10-CM | POA: Diagnosis not present

## 2021-04-30 LAB — CBC WITH DIFFERENTIAL/PLATELET
Abs Immature Granulocytes: 0.02 10*3/uL (ref 0.00–0.07)
Basophils Absolute: 0.1 10*3/uL (ref 0.0–0.1)
Basophils Relative: 1 %
Eosinophils Absolute: 0.3 10*3/uL (ref 0.0–0.5)
Eosinophils Relative: 4 %
HCT: 38.7 % (ref 36.0–46.0)
Hemoglobin: 12.5 g/dL (ref 12.0–15.0)
Immature Granulocytes: 0 %
Lymphocytes Relative: 47 %
Lymphs Abs: 4 10*3/uL (ref 0.7–4.0)
MCH: 27.2 pg (ref 26.0–34.0)
MCHC: 32.3 g/dL (ref 30.0–36.0)
MCV: 84.1 fL (ref 80.0–100.0)
Monocytes Absolute: 0.4 10*3/uL (ref 0.1–1.0)
Monocytes Relative: 5 %
Neutro Abs: 3.6 10*3/uL (ref 1.7–7.7)
Neutrophils Relative %: 43 %
Platelets: 385 10*3/uL (ref 150–400)
RBC: 4.6 MIL/uL (ref 3.87–5.11)
RDW: 14.1 % (ref 11.5–15.5)
WBC: 8.3 10*3/uL (ref 4.0–10.5)
nRBC: 0 % (ref 0.0–0.2)

## 2021-04-30 LAB — COMPREHENSIVE METABOLIC PANEL
ALT: 7 U/L (ref 0–44)
AST: 15 U/L (ref 15–41)
Albumin: 3.9 g/dL (ref 3.5–5.0)
Alkaline Phosphatase: 65 U/L (ref 38–126)
Anion gap: 10 (ref 5–15)
BUN: 13 mg/dL (ref 6–20)
CO2: 24 mmol/L (ref 22–32)
Calcium: 9.2 mg/dL (ref 8.9–10.3)
Chloride: 103 mmol/L (ref 98–111)
Creatinine, Ser: 1.08 mg/dL — ABNORMAL HIGH (ref 0.44–1.00)
GFR, Estimated: >60 mL/min (ref >60)
Glucose, Bld: 84 mg/dL (ref 70–99)
Potassium: 3.4 mmol/L — ABNORMAL LOW (ref 3.5–5.1)
Sodium: 137 mmol/L (ref 135–145)
Total Bilirubin: 0.4 mg/dL (ref 0.3–1.2)
Total Protein: 7.3 g/dL (ref 6.5–8.1)

## 2021-04-30 LAB — CBG MONITORING, ED
Glucose-Capillary: 61 mg/dL — ABNORMAL LOW (ref 70–99)
Glucose-Capillary: 65 mg/dL — ABNORMAL LOW (ref 70–99)

## 2021-04-30 LAB — LIPASE, BLOOD: Lipase: 28 U/L (ref 11–51)

## 2021-04-30 MED ORDER — HYDROMORPHONE HCL 1 MG/ML IJ SOLN
2.0000 mg | Freq: Once | INTRAMUSCULAR | Status: AC
  Administered 2021-04-30: 2 mg via INTRAVENOUS
  Filled 2021-04-30: qty 2

## 2021-04-30 MED ORDER — SODIUM CHLORIDE 0.9 % IV SOLN
12.5000 mg | Freq: Once | INTRAVENOUS | Status: AC
  Administered 2021-04-30: 12.5 mg via INTRAVENOUS
  Filled 2021-04-30: qty 0.5

## 2021-04-30 MED ORDER — ONDANSETRON 4 MG PO TBDP
4.0000 mg | ORAL_TABLET | Freq: Once | ORAL | Status: AC
  Administered 2021-04-30: 4 mg via ORAL
  Filled 2021-04-30: qty 1

## 2021-04-30 MED ORDER — DIPHENHYDRAMINE HCL 50 MG/ML IJ SOLN
50.0000 mg | Freq: Once | INTRAMUSCULAR | Status: AC
  Administered 2021-04-30: 50 mg via INTRAVENOUS
  Filled 2021-04-30: qty 1

## 2021-04-30 MED ORDER — HYDROMORPHONE HCL 1 MG/ML IJ SOLN
1.0000 mg | Freq: Once | INTRAMUSCULAR | Status: DC
Start: 2021-04-30 — End: 2021-04-30
  Filled 2021-04-30: qty 1

## 2021-04-30 MED ORDER — ONDANSETRON 4 MG PO TBDP: ORAL_TABLET | ORAL | 0 refills | Status: DC

## 2021-04-30 MED ORDER — SODIUM CHLORIDE 0.9 % IV BOLUS
1000.0000 mL | Freq: Once | INTRAVENOUS | Status: AC
  Administered 2021-04-30: 1000 mL via INTRAVENOUS

## 2021-04-30 NOTE — ED Notes (Signed)
Provided pt with two orange juices and encouraged pt to drink since she is not eating her sandwich.

## 2021-04-30 NOTE — ED Notes (Signed)
Patient transported to CT 

## 2021-04-30 NOTE — ED Triage Notes (Signed)
Pt reports worsening chronic back pain, took  her home dilaudid without relief. Pain radiates to her RLQ with nausea and diarrhea. Pt tearful in triage.

## 2021-04-30 NOTE — ED Provider Notes (Signed)
[place] EMERGENCY DEPARTMENT Provider Note   CSN: [id number] Arrival date & time: 04/30/21  1310     History Chief Complaint  Patient presents with   Back Pain   Abdominal Pain    Linda Mercado is a 55 y.o. female history of diabetes, hypertension, chronic abdominal pain with a pain pump here presenting with abdominal pain and back pain. Patient has history of chronic pain and is put on Dilaudid.  Patient has been having nausea and vomiting.  Has loose stools and diarrhea as well.  States that this exacerbated her back pain.  She states that the pain radiate down her legs.  Denies any saddle anesthesia.  Denies any trouble walking or trouble urinating.   The history is provided by the patient.      Past Medical History:  Diagnosis Date   Acid reflux    Bradycardia    Chronic back pain    Diabetes mellitus without complication ([place])    Hypertension    Hypokalemia     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   Hypokalemia 03/18/2018   Intractable back pain 01/03/2018   HTN (hypertension) 01/03/2018   Type 2 diabetes mellitus without complication ([place]) 12/87/8676   Vitamin D deficiency 07/11/2017   Postmenopausal 04/24/2017   Microalbuminuria 01/14/2017   Dyspareunia in female 12/10/2016   Endometriosis determined by laparoscopy 12/10/2016   History of exploratory laparotomy 12/10/2016   Pelvic pain 12/10/2016   Bipolar 1 disorder ([place]) 09/19/2016   Cyst of right ovary 09/19/2016   Severe episode of recurrent major depressive disorder, with  psychotic features ([place]) 09/19/2016   Schizoaffective disorder, bipolar type ([place]) 09/19/2016   Chronic pain 03/05/2016   Non morbid obesity due to excess calories 03/05/2016   S/P lumbar fusion 03/05/2016    Past Surgical History:  Procedure Laterality Date   APPENDECTOMY     BACK SURGERY     x6   CESAREAN SECTION     x3   ESOPHAGEAL MANOMETRY N/A 08/25/2019   Procedure: ESOPHAGEAL MANOMETRY (EM);  Surgeon: Carol Ada, MD;  Location: [facility];  Service: Endoscopy;  Laterality: N/A;   HERNIA REPAIR       OB History   No obstetric history on file.     Family History  Problem Relation Age of Onset   Kidney disease Mother    Hypertension Mother    Diabetes Father    Hypertension Father    Bipolar disorder Sister    Schizophrenia Sister    Neuropathy Sister    Hypertension Sister    Bipolar disorder Daughter    Post-traumatic stress disorder Daughter    Asthma Daughter    Depression Daughter    Bipolar disorder Daughter    Post-traumatic stress disorder Daughter    Asthma Daughter    Depression Daughter    Asthma Daughter    Depression Daughter    Asthma Daughter    Breast cancer Paternal Grandmother    Bipolar disorder Brother    Bipolar disorder Brother    Schizophrenia Brother  Stroke Neg Hx    Cancer Neg Hx    CAD Neg Hx     Social History   Tobacco Use   Smoking status: Never   Smokeless tobacco: Never  Vaping Use   Vaping Use: Never used  Substance Use Topics   Alcohol use: Not Currently   Drug use: Never    Home Medications Prior to Admission medications   Medication Sig Start Date End Date Taking? Authorizing Provider  albuterol (VENTOLIN HFA) 108 (90 Base) MCG/ACT inhaler Inhale 2 puffs into the lungs every 6 (six) hours as needed for wheezing or shortness of breath. 01/30/21   [name], FNP  Alcohol Swabs PADS Use as directed to check blood sugars 01/30/21   [name], FNP  AMITIZA 24 MCG capsule Take 24 mcg by mouth 2 (two)  times daily.  10/23/18   [provider]  atenolol (TENORMIN) 100 MG tablet TAKE 1 TABLET(100 MG) BY MOUTH DAILY 01/30/21   [name], FNP  atorvastatin (LIPITOR) 10 MG tablet TAKE 1 TABLET(10 MG) BY MOUTH AT BEDTIME 01/30/21   [name], FNP  benzonatate (TESSALON PERLES) 100 MG capsule Take 1 capsule (100 mg total) by mouth every 6 (six) hours as needed. 07/19/20 07/19/21  [name], FNP  Blood Glucose Monitoring Suppl [facility] VERIO) w/Device KIT Use as directed to check 1 time per day dx: e11.65 02/07/21   [name], FNP  calcium gluconate 500 MG tablet Take 1 tablet (500 mg total) by mouth 3 (three) times daily. 01/30/21   [name], FNP  CAPLYTA 42 MG CAPS Take 42 mg by mouth at bedtime.  03/19/19   [provider]  clonazePAM (KLONOPIN) 1 MG tablet Take 1 mg by mouth 3 (three) times daily.    [provider]  Cyanocobalamin (B-12) 500 MCG SUBL One SL QD 05/22/20   [name], FNP  dexlansoprazole (DEXILANT) 60 MG capsule TAKE 1 CAPSULE BY MOUTH EVERY DAY 03/22/21   [name], FNP  diclofenac Sodium (VOLTAREN) 1 % GEL APPLY 4 GRAMS TO THE AFFECTED AREA FOUR TIMES DAILY 05/22/20   [name], FNP  docusate sodium (COLACE) 50 MG capsule Take 50 mg by mouth daily.     [provider]  FLOVENT HFA 110 MCG/ACT inhaler INHALE 2 PUFFS INTO THE LUNGS EVERY 12 HOURS 11/23/20   [name], FNP  Glucagon (GVOKE HYPOPEN 2-PACK) 0.5 MG/0.1ML SOAJ Inject 1 each into the skin as needed. 01/30/21   [name], FNP  glucose blood (ACCU-CHEK GUIDE) test strip USE TO TEST TWICE DAILY AS DIRECTED 02/08/21   [name], FNP  hydrochlorothiazide (HYDRODIURIL) 12.5 MG tablet TAKE 1 TABLET(12.5 MG) BY MOUTH DAILY 03/27/21   [name], FNP  HYDROmorphone (DILAUDID) 4 MG tablet Take 4 mg by mouth 4 (four) times daily as needed for severe pain.    [provider]  hydrOXYzine (ATARAX/VISTARIL) 25 MG tablet Take 25 mg by mouth in the morning, at  noon, in the evening, and at bedtime.    [provider]  Insulin Pen Needle (PEN NEEDLES) 32G X 4 MM MISC Use as directed with tresiba 09/11/20   [name], FNP  lidocaine (XYLOCAINE) 2 % solution Use as directed 15 mLs in the mouth or throat 3 (three) times a week.  04/19/19   [provider]  Lidocaine 4 % PTCH Place 1 patch onto the skin in the morning and at bedtime. 05/22/20   [name], FNP  LORazepam (ATIVAN) 2 MG tablet  Take 2 mg by mouth 4 (four) times daily as needed for anxiety.     [provider]  losartan (COZAAR) 25 MG tablet Take 1 tablet (25 mg total) by mouth daily. 01/30/21 01/30/22  [name], FNP  meclizine (ANTIVERT) 12.5 MG tablet Take 1 tablet (12.5 mg total) by mouth 3 (three) times daily as needed for dizziness. 04/17/21   [name], FNP  Multiple Vitamin (MULTIVITAMIN WITH MINERALS) TABS tablet Take 1 tablet by mouth daily.     [provider]  NARCAN 4 MG/0.1ML LIQD nasal spray kit Place 1 spray into the nose as directed. 01/19/18   [provider]  Norgestimate-Ethinyl Estradiol Triphasic (TRI-SPRINTEC) 0.18/0.215/0.25 MG-35 MCG tablet Take 1 tablet by mouth daily. 04/17/21   [name], FNP  ondansetron (ZOFRAN) 4 MG tablet Take 4 mg by mouth as needed for nausea.     [provider]  OneTouch Delica Lancets 64B MISC Use as directed to check 1 time per day dx: e11.65 02/07/21   [name], FNP  [facility] VERIO test strip Use as directed to check 1 time per day dx: e11.65 02/07/21   [name], FNP  PAIN MANAGEMENT INTRATHECAL, IT, PUMP 1 each by Intrathecal route continuous. Intrathecal (IT) medication: fentanyl, clonidine, bupivicaine  Fentanyl 248m/bupivicaine150mg/clonidine53mg  51.964mper day, 53m86mer day, 1.66m73m5.4ml/77mn    [provider]  polyethylene glycol powder (GLYCOLAX/MIRALAX) 17 GM/SCOOP powder MIX 17 GRAMS IN 8 OUNCES OF LIQUID AND DRINK DAILY FOR BOWELS 12/17/19    Rodriguez-Southworth, SylviSunday SpillersC  promethazine (PHENERGAN) 25 MG tablet Take 1 tablet (25 mg total) by mouth every 6 (six) hours as needed for nausea or vomiting. 05/22/20   MooreMinette Brine  QUEtiapine (SEROQUEL XR) 400 MG 24 hr tablet Take 400 mg by mouth 2 (two) times daily.    [provider]  QUEtiapine (SEROQUEL) 300 MG tablet Take 300 mg by mouth at bedtime.    [provider]  sitaGLIPtin-metformin (JANUMET) 50-1000 MG tablet Take 1 tablet by mouth 2 (two) times daily with a meal. 02/07/21   MooreMinette Brine  tiZANidine (ZANAFLEX) 4 MG tablet Take 4 mg by mouth every 4 (four) hours. 08/18/19   [provider]  traZODone (DESYREL) 100 MG tablet 5 qhs prescribed by psychiatry Patient taking differently: Take 100-200 mg by mouth at bedtime. 5 qhs prescribed by psychiatry 02/18/19   Rodriguez-Southworth, SylviSunday SpillersC  triamcinolone cream (KENALOG) 0.1 % Apply 1 application topically daily as needed (for itching). 03/08/19   Rodriguez-Southworth, SylviSunday SpillersC  TRINTELLIX 20 MG TABS tablet Take 20 mg by mouth daily. 02/22/20   [provider]  VRAYLAR 6 MG CAPS Take 6 mg by mouth daily. 08/05/18   [provider]    Allergies    Lisinopril, Latex, Morphine and related, and Adhesive [tape]  Review of Systems   Review of Systems  All other systems reviewed and are negative.  Physical Exam Updated Vital Signs BP 137/85 (BP Location: Right Arm)   Pulse 63   Temp 97.8 F (36.6 C)   Resp 18   SpO2 100%   Physical Exam Vitals and nursing note reviewed.  Constitutional:      Comments: Uncomfortable  HENT:     Head: Normocephalic.     Mouth/Throat:     Mouth: Mucous membranes are moist.  Eyes:     Extraocular Movements: Extraocular movements intact.     Pupils: Pupils are equal, round, and reactive to light.  Cardiovascular:  Rate and Rhythm: Regular rhythm.  Abdominal:     General: Abdomen is flat.     Comments: Pain pump in the  right lower quadrant.  Mild diffuse tenderness.  Skin:    General: Skin is warm.     Capillary Refill: Capillary refill takes less than 2 seconds.  Neurological:     General: No focal deficit present.     Comments: Patient has no saddle anesthesia but has positive straight leg raise bilateral legs.  Patient has good reflexes bilaterally.  Normal sensation bilaterally.  Normal motor strength bilateral legs  Psychiatric:        Mood and Affect: Mood normal.        Behavior: Behavior normal.    ED Results / Procedures / Treatments   Labs (all labs ordered are listed, but only abnormal results are displayed) Labs Reviewed  COMPREHENSIVE METABOLIC PANEL - Abnormal; Notable for the following components:      Result Value   Potassium 3.4 (*)    Creatinine, Ser 1.08 (*)    All other components within normal limits  CBC WITH DIFFERENTIAL/PLATELET  LIPASE, BLOOD  URINALYSIS, ROUTINE W REFLEX MICROSCOPIC    EKG None  Radiology No results found.  Procedures Procedures  Angiocath insertion Performed by: Wandra Arthurs  Consent: Verbal consent obtained. Risks and benefits: risks, benefits and alternatives were discussed Time out: Immediately prior to procedure a "time out" was called to verify the correct patient, procedure, equipment, support staff and site/side marked as required.  Preparation: Patient was prepped and draped in the usual sterile fashion.  Vein Location: R brachial  Ultrasound Guided  Gauge: 20 long   Normal blood return and flush without difficulty Patient tolerance: Patient tolerated the procedure well with no immediate complications.     Medications Ordered in ED Medications  ondansetron (ZOFRAN-ODT) disintegrating tablet 4 mg (4 mg Oral Given 04/30/21 1707)  sodium chloride 0.9 % bolus 1,000 mL (1,000 mLs Intravenous New Bag/Given 04/30/21 2138)  promethazine (PHENERGAN) 12.5 mg in sodium chloride 0.9 % 50 mL IVPB (12.5 mg Intravenous New Bag/Given  04/30/21 2158)  HYDROmorphone (DILAUDID) injection 2 mg (2 mg Intravenous Given 04/30/21 2133)  diphenhydrAMINE (BENADRYL) injection 50 mg (50 mg Intravenous Given 04/30/21 2152)    ED Course  I have reviewed the triage vital signs and the nursing notes.  Pertinent labs & imaging results that were available during my care of the patient were reviewed by me and considered in my medical decision making (see chart for details).    MDM Rules/Calculators/A&P                           DAMARIA Linda Mercado is a 55 y.o. female here presenting with abdominal pain and back pain.  This is likely worsening of her chronic pain versus gastroenteritis.  Plan to get CBC CMP and lipase and CT abdomen pelvis.  Will hydrate patient and given pain meds and reassess.   10:49 PM Patient was given dilaudid and had hives. Given benadryl and felt better. CT unremarkable. I think likely she has chronic pain.  Stable for discharge and I told her to call her chronic pain doctor.  She was concerned that maybe her pain pump is not functioning well.  I told her that I do not manage pain pumps and she needs to talk to her pain management doctor  Final Clinical Impression(s) / ED Diagnoses Final diagnoses:  None  Rx / DC Orders ED Discharge Orders     None        Drenda Freeze, MD 04/30/21 2252

## 2021-04-30 NOTE — ED Provider Notes (Signed)
Emergency Medicine Provider Triage Evaluation Note  Linda Mercado , a 55 y.o. female  was evaluated in triage.  Pt complains of ongoing low back pain this is a chronic issue.  Check physical complaints of abdominal pain nausea vomiting diarrhea which is a newer issue.  She states she has been taking p.o. Dilaudid in addition to the pain pump that she has administering analgesia to her.  No chest pain shortness of breath lightness or dizziness no traumas.  She denies any saddle anesthesia.  She denies any new numbness although she states that she always has issues with numbness in her legs.  Review of Systems  Positive: Back pain, abdominal pain, nausea, vomiting, diarrhea Negative: Fevers  Physical Exam  BP 139/90 (BP Location: Right Arm)   Pulse 73   Temp 97.8 F (36.6 C)   Resp 18   SpO2 100%  Gen:   Awake, very uncomfortable Resp:  Normal effort  MSK:   Moves extremities without difficulty  Other:  Moves all 4 extremities.  Abdomen is soft not focally tender  Medical Decision Making  Medically screening exam initiated at 3:37 PM.  Appropriate orders placed.  Linda Mercado was informed that the remainder of the evaluation will be completed by another provider, this initial triage assessment does not replace that evaluation, and the importance of remaining in the ED until their evaluation is complete.  Patient is a 55 year old female with chronic pain presenting today with ongoing chronic pain worse low back pain and now with some nausea vomiting and diarrhea.  Will obtain abdominal labs.   Tedd Sias, [place] 04/30/21 [place], [place], DO 05/01/21 0715

## 2021-04-30 NOTE — Discharge Instructions (Addendum)
You have chronic pain and your pump is in the right location currently.  If is not functioning well you need to call your pain management doctor.  Please continue your pain medicines as prescribed by your doctor  See your pain management doctor   Return to ER if you have worse abdominal pain, vomiting

## 2021-05-02 ENCOUNTER — Telehealth: Payer: MEDICARE

## 2021-05-02 ENCOUNTER — Ambulatory Visit: Payer: MEDICARE | Admitting: Nurse Practitioner

## 2021-05-02 ENCOUNTER — Telehealth: Payer: Self-pay

## 2021-05-02 NOTE — Telephone Encounter (Signed)
  Care Management   Follow Up Note   05/02/2021 Name: Linda Mercado MRN: [id number] DOB: [birth year]   Referred by: [name], FNP Reason for referral : Chronic Care Management (Unsuccessful call)   A second unsuccessful telephone outreach was attempted today. The patient was referred to the case management team for assistance with care management and care coordination. SW left a HIPAA compliant voice message requesting a return call.  Follow Up Plan: The care management team will reach out to the patient again over the next 14 days.   Daneen Schick, BSW, CDP Social Worker, Certified Dementia Practitioner [place] / [place] [facility] [phone]

## 2021-05-04 ENCOUNTER — Telehealth: Payer: Self-pay

## 2021-05-04 NOTE — Telephone Encounter (Signed)
Left pt vm to give the office a call back. Provider wants to know if patient followed up with pain doctor, after ED visit.

## 2021-05-07 ENCOUNTER — Ambulatory Visit (INDEPENDENT_AMBULATORY_CARE_PROVIDER_SITE_OTHER): Payer: MEDICARE

## 2021-05-07 DIAGNOSIS — I10 Essential (primary) hypertension: Secondary | ICD-10-CM

## 2021-05-07 DIAGNOSIS — E063 Autoimmune thyroiditis: Secondary | ICD-10-CM

## 2021-05-07 DIAGNOSIS — E119 Type 2 diabetes mellitus without complications: Secondary | ICD-10-CM

## 2021-05-07 NOTE — Chronic Care Management (AMB) (Signed)
Chronic Care Management    Social Work Note  05/07/2021 Name: Linda Mercado MRN: [id number] DOB: [birth year]  Linda Mercado is a 55 y.o. year old female who is a primary care patient of [name], [place]. The CCM team was consulted to assist the patient with chronic disease management and/or care coordination needs related to:  M II, HTN, Hashimoto's Disease .   Engaged with patient by telephone for follow up visit in response to provider referral for social work chronic care management and care coordination services.   Consent to Services:  The patient was given information about Chronic Care Management services, agreed to services, and gave verbal consent prior to initiation of services.  Please see initial visit note for detailed documentation.   Patient agreed to services and consent obtained.   Assessment: Review of patient past medical history, allergies, medications, and health status, including review of relevant consultants reports was performed today as part of a comprehensive evaluation and provision of chronic care management and care coordination services.     SDOH (Social Determinants of Health) assessments and interventions performed:    Advanced Directives Status: Not addressed in this encounter.  CCM Care Plan  Allergies  Allergen Reactions   Lisinopril Hives    Other reaction(s): anaphylaxis/angioedema   Latex Hives   Morphine And Related     Itching, skin get hot.    Adhesive [Tape] Rash    Outpatient Encounter Medications as of 05/07/2021  Medication Sig Note   albuterol (VENTOLIN HFA) 108 (90 Base) MCG/ACT inhaler Inhale 2 puffs into the lungs every 6 (six) hours as needed for wheezing or shortness of breath.    Alcohol Swabs PADS Use as directed to check blood sugars    AMITIZA 24 MCG capsule Take 24 mcg by mouth 2 (two) times daily.     atenolol (TENORMIN) 100 MG tablet TAKE 1 TABLET(100 MG) BY MOUTH DAILY    atorvastatin (LIPITOR) 10 MG tablet TAKE 1  TABLET(10 MG) BY MOUTH AT BEDTIME    benzonatate (TESSALON PERLES) 100 MG capsule Take 1 capsule (100 mg total) by mouth every 6 (six) hours as needed.    Blood Glucose Monitoring Suppl (ONETOUCH VERIO) w/Device KIT Use as directed to check 1 time per day dx: e11.65    calcium gluconate 500 MG tablet Take 1 tablet (500 mg total) by mouth 3 (three) times daily.    CAPLYTA 42 MG CAPS Take 42 mg by mouth at bedtime.     clonazePAM (KLONOPIN) 1 MG tablet Take 1 mg by mouth 3 (three) times daily.    Cyanocobalamin (B-12) 500 MCG SUBL One SL QD    dexlansoprazole (DEXILANT) 60 MG capsule TAKE 1 CAPSULE BY MOUTH EVERY DAY    diclofenac Sodium (VOLTAREN) 1 % GEL APPLY 4 GRAMS TO THE AFFECTED AREA FOUR TIMES DAILY    docusate sodium (COLACE) 50 MG capsule Take 50 mg by mouth daily.     FLOVENT HFA 110 MCG/ACT inhaler INHALE 2 PUFFS INTO THE LUNGS EVERY 12 HOURS    Glucagon (GVOKE HYPOPEN 2-PACK) 0.5 MG/0.1ML SOAJ Inject 1 each into the skin as needed.    glucose blood (ACCU-CHEK GUIDE) test strip USE TO TEST TWICE DAILY AS DIRECTED    hydrochlorothiazide (HYDRODIURIL) 12.5 MG tablet TAKE 1 TABLET(12.5 MG) BY MOUTH DAILY    HYDROmorphone (DILAUDID) 4 MG tablet Take 4 mg by mouth 4 (four) times daily as needed for severe pain.    hydrOXYzine (ATARAX/VISTARIL) 25 MG tablet  Take 25 mg by mouth in the morning, at noon, in the evening, and at bedtime.    Insulin Pen Needle (PEN NEEDLES) 32G X 4 MM MISC Use as directed with tresiba    lidocaine (XYLOCAINE) 2 % solution Use as directed 15 mLs in the mouth or throat 3 (three) times a week.     Lidocaine 4 % PTCH Place 1 patch onto the skin in the morning and at bedtime.    LORazepam (ATIVAN) 2 MG tablet Take 2 mg by mouth 4 (four) times daily as needed for anxiety.     losartan (COZAAR) 25 MG tablet Take 1 tablet (25 mg total) by mouth daily.    meclizine (ANTIVERT) 12.5 MG tablet Take 1 tablet (12.5 mg total) by mouth 3 (three) times daily as needed for  dizziness.    Multiple Vitamin (MULTIVITAMIN WITH MINERALS) TABS tablet Take 1 tablet by mouth daily.     NARCAN 4 MG/0.1ML LIQD nasal spray kit Place 1 spray into the nose as directed.    Norgestimate-Ethinyl Estradiol Triphasic (TRI-SPRINTEC) 0.18/0.215/0.25 MG-35 MCG tablet Take 1 tablet by mouth daily.    ondansetron (ZOFRAN ODT) 4 MG disintegrating tablet 35m ODT q4 hours prn nausea/vomit    ondansetron (ZOFRAN) 4 MG tablet Take 4 mg by mouth as needed for nausea.     OneTouch Delica Lancets 325WMISC Use as directed to check 1 time per day dx: e11.65    ONETOUCH VERIO test strip Use as directed to check 1 time per day dx: e11.65    PAIN MANAGEMENT INTRATHECAL, IT, PUMP 1 each by Intrathecal route continuous. Intrathecal (IT) medication: fentanyl, clonidine, bupivicaine  Fentanyl 2528mbupivicaine150mg/clonidine2mg  51.9726mer day, 2mg58mr day, 1.44mc44m.4ml/q54m    polyethylene glycol powder (GLYCOLAX/MIRALAX) 17 GM/SCOOP powder MIX 17 GRAMS IN 8 OUNCES OF LIQUID AND DRINK DAILY FOR BOWELS    promethazine (PHENERGAN) 25 MG tablet Take 1 tablet (25 mg total) by mouth every 6 (six) hours as needed for nausea or vomiting.    QUEtiapine (SEROQUEL XR) 400 MG 24 hr tablet Take 400 mg by mouth 2 (two) times daily.    QUEtiapine (SEROQUEL) 300 MG tablet Take 300 mg by mouth at bedtime.    sitaGLIPtin-metformin (JANUMET) 50-1000 MG tablet Take 1 tablet by mouth 2 (two) times daily with a meal.    tiZANidine (ZANAFLEX) 4 MG tablet Take 4 mg by mouth every 4 (four) hours.    traZODone (DESYREL) 100 MG tablet 5 qhs prescribed by psychiatry (Patient taking differently: Take 100-200 mg by mouth at bedtime. 5 qhs prescribed by psychiatry)    triamcinolone cream (KENALOG) 0.1 % Apply 1 application topically daily as needed (for itching).    TRINTELLIX 20 MG TABS tablet Take 20 mg by mouth daily.    VRAYLAR 6 MG CAPS Take 6 mg by mouth daily. 06/21/2020: D/C by psychiatrist Lida PShelbie Hutchingatient    No  facility-administered encounter medications on file as of 05/07/2021.    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   Hypokalemia 03/18/2018  Intractable back pain 01/03/2018   HTN (hypertension) 01/03/2018   Type 2 diabetes mellitus without complication ([place]) 93/57/0177   Vitamin D deficiency 07/11/2017   Postmenopausal 04/24/2017   Microalbuminuria 01/14/2017   Dyspareunia in female 12/10/2016   Endometriosis determined by laparoscopy 12/10/2016   History of exploratory laparotomy 12/10/2016   Pelvic pain 12/10/2016   Bipolar 1 disorder ([place]) 09/19/2016   Cyst of right ovary 09/19/2016   Severe episode of recurrent major depressive disorder, with psychotic features ([place]) 09/19/2016   Schizoaffective disorder, bipolar type ([place]) 09/19/2016   Chronic pain 03/05/2016   Non morbid obesity due to excess calories 03/05/2016   S/P lumbar fusion 03/05/2016    Conditions to be addressed/monitored: HTN, DMII, and Hashimoto's Disease ; ADL IADL limitations  Care Plan : Social Work [place]  Updates made by Daneen Schick since 05/07/2021 12:00 AM     Problem: Barriers to Treatment      Goal: Barriers to Treatment Identified and Managed   Start Date: 02/27/2021  Expected End Date: 04/13/2021  This Visit's Progress: On track  Recent Progress: On track  Priority: High  Note:   Late entry from 9.7.22 Current Barriers:  Chronic disease management support and education needs related to HTN, DM, and Hashimoto's Disease   Transportation  Social Worker Clinical Goal(s):  patient  will work with SW to identify and address any acute and/or chronic care coordination needs related to the self health management of HTN, DM, and Hashimoto's Disease   patient will work with SW to address concerns related to transportation barriers for physician appointments  SW Interventions:  Inter-disciplinary care team collaboration (see longitudinal plan of care) Collaboration with [name], [place] regarding development and update of comprehensive plan of care as evidenced by provider attestation and co-signature Successful outbound call placed to the patient to assess goal progression Determined the patient has yet to hear from [facility] to schedule an assessment for [facility] called placed to [facility] to determine the patient is scheduled with nurse Tiffany for an in home assessment on Tuesday November 1st at 12:30 pm Advised the patient SW would contact her prior to this appointment to remind her of the assessment date and time Patient reports she has been experiencing a lot of vomiting and has an appointment Wednesday with her gastroenterologist Advised the patient SW would update RN Care Manager on symptoms and plan to follow up with Dr. Hilario Quarry with [place]  Patient Goals/Self-Care Activities patient will:   - Attend appointment with Dr. Benson Norway on Wednesday 10/19 -Complete in home assessment with nurse Tiffany on November 1 at 12:30 -Contact SW as needed prior to next scheduled call  Follow Up Plan: The care management team will reach out to the patient again over the next 20 days.        Follow Up Plan: SW will follow up with patient by phone over the next month      Daneen Schick, BSW, CDP Social Worker, Certified Dementia Practitioner [place] / [place] [facility] [phone]

## 2021-05-07 NOTE — Patient Instructions (Signed)
Social Worker Visit Information  Goals we discussed today:   Goals Addressed             This Visit's Progress    Barriers to Treatment Identified and Managed       Timeframe:  Short-Term Goal Priority:  High Start Date:  8.9.22                           Expected End Date: 9.23.22             Next planned outreach: 10.28.22            Patient Goals/Self-Care Activities patient will:   - Attend appointment with Dr. Benson Norway on Wednesday 10/19 -Complete in home assessment with nurse Tiffany on November 1 at 12:30 -Contact SW as needed prior to next scheduled call         Materials Provided: Yes: verbal and written reminder of in home assessment appointment  Patient verbalizes understanding of instructions provided today and agrees to view in [place].   Follow Up Plan: SW will follow up with patient by phone over the next month   Daneen Schick, BSW, CDP Social Worker, Certified Dementia Practitioner [place] / [place] [facility] [phone]

## 2021-05-08 ENCOUNTER — Telehealth: Payer: Self-pay

## 2021-05-08 NOTE — Chronic Care Management (AMB) (Signed)
    Called Linda Mercado, No answer, left message of appointment on 05-09-2021 at 10:00 via telephone visit with Orlando Penner, Pharm D. Notified to have all medications, supplements, blood pressure and/or blood sugar logs available during appointment and to return call if need to reschedule.   Care Gaps: Medicare wellness 09-19-2021 Flu vaccine overdue last completed 05-11-2018 Covid booster overdue last completed 12-31-2020  Star Rating Drug: Losartan 50 mg- Last filled 04-22-2021 [facility] Atorvastatin 10 mg- Last filled 02-14-2021 [facility] Janumet 50-1000 mg- Last filled 02-16-2021 [facility]  Any gaps in medications fill history? No  [facility] Pharmacist Assistant [phone]

## 2021-05-09 ENCOUNTER — Telehealth: Payer: MEDICARE

## 2021-05-18 ENCOUNTER — Ambulatory Visit: Payer: MEDICARE

## 2021-05-18 DIAGNOSIS — I10 Essential (primary) hypertension: Secondary | ICD-10-CM

## 2021-05-18 DIAGNOSIS — E119 Type 2 diabetes mellitus without complications: Secondary | ICD-10-CM

## 2021-05-18 DIAGNOSIS — E063 Autoimmune thyroiditis: Secondary | ICD-10-CM

## 2021-05-18 NOTE — Patient Instructions (Signed)
Social Worker Visit Information  Goals we discussed today:   Goals Addressed             This Visit's Progress    Barriers to Treatment Identified and Managed       Timeframe:  Short-Term Goal Priority:  High Start Date:  8.9.22                                Next planned outreach: 11.11.22            Patient Goals/Self-Care Activities patient will:   - [place] at [phone] to reschedule in home assessment -Contact SW as needed prior to next scheduled call         Materials Provided: Verbal education about resources provided by phone  Patient verbalizes understanding of instructions provided today and agrees to view in [place].   Follow Up Plan: SW will follow up with patient by phone over the next 21 days   Daneen Schick, BSW, CDP Social Worker, Certified Dementia Practitioner [place] / [place] [facility] [phone]

## 2021-05-18 NOTE — Chronic Care Management (AMB) (Signed)
Chronic Care Management    Social Work Note  05/18/2021 Name: Linda Mercado MRN: [id number] DOB: 1965-08-30  Linda Mercado is a 55 y.o. year old female who is a primary care patient of [name], [place]. The CCM team was consulted to assist the patient with chronic disease management and/or care coordination needs related to:  DM II, HTN, Hashimoto's DIsease .   Engaged with patient by telephone for follow up visit in response to provider referral for social work chronic care management and care coordination services.   Consent to Services:  The patient was given information about Chronic Care Management services, agreed to services, and gave verbal consent prior to initiation of services.  Please see initial visit note for detailed documentation.   Patient agreed to services and consent obtained.   Assessment: Review of patient past medical history, allergies, medications, and health status, including review of relevant consultants reports was performed today as part of a comprehensive evaluation and provision of chronic care management and care coordination services.     SDOH (Social Determinants of Health) assessments and interventions performed:    Advanced Directives Status: Not addressed in this encounter.  CCM Care Plan  Allergies  Allergen Reactions   Lisinopril Hives    Other reaction(s): anaphylaxis/angioedema   Latex Hives   Morphine And Related     Itching, skin get hot.    Adhesive [Tape] Rash    Outpatient Encounter Medications as of 05/18/2021  Medication Sig Note   albuterol (VENTOLIN HFA) 108 (90 Base) MCG/ACT inhaler Inhale 2 puffs into the lungs every 6 (six) hours as needed for wheezing or shortness of breath.    Alcohol Swabs PADS Use as directed to check blood sugars    AMITIZA 24 MCG capsule Take 24 mcg by mouth 2 (two) times daily.     atenolol (TENORMIN) 100 MG tablet TAKE 1 TABLET(100 MG) BY MOUTH DAILY    atorvastatin (LIPITOR) 10 MG tablet TAKE 1  TABLET(10 MG) BY MOUTH AT BEDTIME    benzonatate (TESSALON PERLES) 100 MG capsule Take 1 capsule (100 mg total) by mouth every 6 (six) hours as needed.    Blood Glucose Monitoring Suppl (ONETOUCH VERIO) w/Device KIT Use as directed to check 1 time per day dx: e11.65    calcium gluconate 500 MG tablet Take 1 tablet (500 mg total) by mouth 3 (three) times daily.    CAPLYTA 42 MG CAPS Take 42 mg by mouth at bedtime.     clonazePAM (KLONOPIN) 1 MG tablet Take 1 mg by mouth 3 (three) times daily.    Cyanocobalamin (B-12) 500 MCG SUBL One SL QD    dexlansoprazole (DEXILANT) 60 MG capsule TAKE 1 CAPSULE BY MOUTH EVERY DAY    diclofenac Sodium (VOLTAREN) 1 % GEL APPLY 4 GRAMS TO THE AFFECTED AREA FOUR TIMES DAILY    docusate sodium (COLACE) 50 MG capsule Take 50 mg by mouth daily.     FLOVENT HFA 110 MCG/ACT inhaler INHALE 2 PUFFS INTO THE LUNGS EVERY 12 HOURS    Glucagon (GVOKE HYPOPEN 2-PACK) 0.5 MG/0.1ML SOAJ Inject 1 each into the skin as needed.    glucose blood (ACCU-CHEK GUIDE) test strip USE TO TEST TWICE DAILY AS DIRECTED    hydrochlorothiazide (HYDRODIURIL) 12.5 MG tablet TAKE 1 TABLET(12.5 MG) BY MOUTH DAILY    HYDROmorphone (DILAUDID) 4 MG tablet Take 4 mg by mouth 4 (four) times daily as needed for severe pain.    hydrOXYzine (ATARAX/VISTARIL) 25 MG tablet  Take 25 mg by mouth in the morning, at noon, in the evening, and at bedtime.    Insulin Pen Needle (PEN NEEDLES) 32G X 4 MM MISC Use as directed with tresiba    lidocaine (XYLOCAINE) 2 % solution Use as directed 15 mLs in the mouth or throat 3 (three) times a week.     Lidocaine 4 % PTCH Place 1 patch onto the skin in the morning and at bedtime.    LORazepam (ATIVAN) 2 MG tablet Take 2 mg by mouth 4 (four) times daily as needed for anxiety.     losartan (COZAAR) 25 MG tablet Take 1 tablet (25 mg total) by mouth daily.    meclizine (ANTIVERT) 12.5 MG tablet Take 1 tablet (12.5 mg total) by mouth 3 (three) times daily as needed for  dizziness.    Multiple Vitamin (MULTIVITAMIN WITH MINERALS) TABS tablet Take 1 tablet by mouth daily.     NARCAN 4 MG/0.1ML LIQD nasal spray kit Place 1 spray into the nose as directed.    Norgestimate-Ethinyl Estradiol Triphasic (TRI-SPRINTEC) 0.18/0.215/0.25 MG-35 MCG tablet Take 1 tablet by mouth daily.    ondansetron (ZOFRAN ODT) 4 MG disintegrating tablet 47m ODT q4 hours prn nausea/vomit    ondansetron (ZOFRAN) 4 MG tablet Take 4 mg by mouth as needed for nausea.     OneTouch Delica Lancets [id number] Use as directed to check 1 time per day dx: e11.65    ONETOUCH VERIO test strip Use as directed to check 1 time per day dx: e11.65    PAIN MANAGEMENT INTRATHECAL, IT, PUMP 1 each by Intrathecal route continuous. Intrathecal (IT) medication: fentanyl, clonidine, bupivicaine  Fentanyl 2544mbupivicaine150mg/clonidine2mg  51.9744mer day, 2mg75mr day, 1.44mc33m.4ml/q30m    polyethylene glycol powder (GLYCOLAX/MIRALAX) 17 GM/SCOOP powder MIX 17 GRAMS IN 8 OUNCES OF LIQUID AND DRINK DAILY FOR BOWELS    promethazine (PHENERGAN) 25 MG tablet Take 1 tablet (25 mg total) by mouth every 6 (six) hours as needed for nausea or vomiting.    QUEtiapine (SEROQUEL XR) 400 MG 24 hr tablet Take 400 mg by mouth 2 (two) times daily.    QUEtiapine (SEROQUEL) 300 MG tablet Take 300 mg by mouth at bedtime.    sitaGLIPtin-metformin (JANUMET) 50-1000 MG tablet Take 1 tablet by mouth 2 (two) times daily with a meal.    tiZANidine (ZANAFLEX) 4 MG tablet Take 4 mg by mouth every 4 (four) hours.    traZODone (DESYREL) 100 MG tablet 5 qhs prescribed by psychiatry (Patient taking differently: Take 100-200 mg by mouth at bedtime. 5 qhs prescribed by psychiatry)    triamcinolone cream (KENALOG) 0.1 % Apply 1 application topically daily as needed (for itching).    TRINTELLIX 20 MG TABS tablet Take 20 mg by mouth daily.    VRAYLAR 6 MG CAPS Take 6 mg by mouth daily. 06/21/2020: D/C by psychiatrist Lida PShelbie Hutchingatient    No  facility-administered encounter medications on file as of 05/18/2021.    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   Hypokalemia 03/18/2018  Intractable back pain 01/03/2018   HTN (hypertension) 01/03/2018   Type 2 diabetes mellitus without complication ([place]) 33/58/2518   Vitamin D deficiency 07/11/2017   Postmenopausal 04/24/2017   Microalbuminuria 01/14/2017   Dyspareunia in female 12/10/2016   Endometriosis determined by laparoscopy 12/10/2016   History of exploratory laparotomy 12/10/2016   Pelvic pain 12/10/2016   Bipolar 1 disorder ([place]) 09/19/2016   Cyst of right ovary 09/19/2016   Severe episode of recurrent major depressive disorder, with psychotic features ([place]) 09/19/2016   Schizoaffective disorder, bipolar type ([place]) 09/19/2016   Chronic pain 03/05/2016   Non morbid obesity due to excess calories 03/05/2016   S/P lumbar fusion 03/05/2016    Conditions to be addressed/monitored: HTN, DMII, and Hashimoto's Disease ; ADL IADL limitations  Care Plan : Social Work [place]  Updates made by Daneen Schick since 05/18/2021 12:00 AM     Problem: Barriers to Treatment      Goal: Barriers to Treatment Identified and Managed   Start Date: 02/27/2021  Expected End Date: 04/13/2021  Recent Progress: On track  Priority: High  Note:   Late entry from 9.7.22 Current Barriers:  Chronic disease management support and education needs related to HTN, DM, and Hashimoto's Disease   Transportation  Social Worker Clinical Goal(s):  patient will work with SW to identify and  address any acute and/or chronic care coordination needs related to the self health management of HTN, DM, and Hashimoto's Disease   patient will work with SW to address concerns related to transportation barriers for physician appointments  SW Interventions:  Inter-disciplinary care team collaboration (see longitudinal plan of care) Collaboration with [name], [place] regarding development and update of comprehensive plan of care as evidenced by provider attestation and co-signature Successful outbound call placed to the patient to remind of upcomming PCS assessment scheduled for 11.1.22 at 12:30 pm  Determined the patient needs to reschedule this appointment as she has an appointment with her pain clinic provider the same date at 1:00 pm Attempted to assist the patient in rescheduling this appointment - after being on hold with [facility] for over 10 minutes the patient requested the contact number stating she would call to reschedule later this afternoon Discussed the patient is interested in transportation resources to assist with appointments to see her pain clinic provider in [place] Educated the patient on the [place] [facility] program Submitted an application to Callie Fielding with [facility] on behalf of the patient  Patient Goals/Self-Care Activities patient will:   - [place] at [phone] to reschedule in home assessment -Contact SW as needed prior to next scheduled call  Follow Up Plan: The care management team will reach out to the patient again over the next 20 days.        Follow Up Plan: SW will follow up with patient by phone over the next 21 days      Daneen Schick, BSW, CDP Social Worker, Certified Dementia Practitioner [place] [facility] [place] [facility] [phone]

## 2021-05-19 ENCOUNTER — Other Ambulatory Visit: Payer: Self-pay | Admitting: Nurse Practitioner

## 2021-05-21 ENCOUNTER — Other Ambulatory Visit: Payer: Self-pay | Admitting: Nurse Practitioner

## 2021-05-21 DIAGNOSIS — I10 Essential (primary) hypertension: Secondary | ICD-10-CM

## 2021-05-21 DIAGNOSIS — E119 Type 2 diabetes mellitus without complications: Secondary | ICD-10-CM

## 2021-05-24 ENCOUNTER — Other Ambulatory Visit: Payer: Self-pay | Admitting: Nurse Practitioner

## 2021-06-01 ENCOUNTER — Ambulatory Visit (INDEPENDENT_AMBULATORY_CARE_PROVIDER_SITE_OTHER): Payer: MEDICARE

## 2021-06-01 DIAGNOSIS — I10 Essential (primary) hypertension: Secondary | ICD-10-CM

## 2021-06-01 DIAGNOSIS — E119 Type 2 diabetes mellitus without complications: Secondary | ICD-10-CM

## 2021-06-01 DIAGNOSIS — E063 Autoimmune thyroiditis: Secondary | ICD-10-CM

## 2021-06-01 NOTE — Chronic Care Management (AMB) (Signed)
Chronic Care Management    Social Work Note  06/01/2021 Name: Linda Mercado MRN: [id number] DOB: 24-Dec-1965  Linda Mercado is a 55 y.o. year old female who is a primary care patient of [name], [place]. The CCM team was consulted to assist the patient with chronic disease management and/or care coordination needs related to:  HTN, DM II, Hashimoto's Disease .   Engaged with patient by telephone for follow up visit in response to provider referral for social work chronic care management and care coordination services.   Consent to Services:  The patient was given information about Chronic Care Management services, agreed to services, and gave verbal consent prior to initiation of services.  Please see initial visit note for detailed documentation.   Patient agreed to services and consent obtained.   Assessment: Review of patient past medical history, allergies, medications, and health status, including review of relevant consultants reports was performed today as part of a comprehensive evaluation and provision of chronic care management and care coordination services.     SDOH (Social Determinants of Health) assessments and interventions performed:    Advanced Directives Status: Not addressed in this encounter.  CCM Care Plan  Allergies  Allergen Reactions   Lisinopril Hives    Other reaction(s): anaphylaxis/angioedema   Latex Hives   Morphine And Related     Itching, skin get hot.    Adhesive [Tape] Rash    Outpatient Encounter Medications as of 06/01/2021  Medication Sig Note   albuterol (VENTOLIN HFA) 108 (90 Base) MCG/ACT inhaler Inhale 2 puffs into the lungs every 6 (six) hours as needed for wheezing or shortness of breath.    Alcohol Swabs PADS Use as directed to check blood sugars    AMITIZA 24 MCG capsule Take 24 mcg by mouth 2 (two) times daily.     atenolol (TENORMIN) 100 MG tablet TAKE 1 TABLET(100 MG) BY MOUTH DAILY    atorvastatin (LIPITOR) 10 MG tablet TAKE 1  TABLET(10 MG) BY MOUTH AT BEDTIME    BD PEN NEEDLE NANO 2ND GEN 32G X 4 MM MISC USE AS DIRECTED WITH TRESIBA DAILY    benzonatate (TESSALON PERLES) 100 MG capsule Take 1 capsule (100 mg total) by mouth every 6 (six) hours as needed.    Blood Glucose Monitoring Suppl (ONETOUCH VERIO) w/Device KIT Use as directed to check 1 time per day dx: e11.65    calcium gluconate 500 MG tablet Take 1 tablet (500 mg total) by mouth 3 (three) times daily.    CAPLYTA 42 MG CAPS Take 42 mg by mouth at bedtime.     clonazePAM (KLONOPIN) 1 MG tablet Take 1 mg by mouth 3 (three) times daily.    Cyanocobalamin (B-12) 500 MCG SUBL One SL QD    dexlansoprazole (DEXILANT) 60 MG capsule TAKE 1 CAPSULE BY MOUTH EVERY DAY    diclofenac Sodium (VOLTAREN) 1 % GEL APPLY 4 GRAMS TO THE AFFECTED AREA FOUR TIMES DAILY    docusate sodium (COLACE) 50 MG capsule Take 50 mg by mouth daily.     FLOVENT HFA 110 MCG/ACT inhaler INHALE 2 PUFFS INTO THE LUNGS EVERY 12 HOURS    Glucagon (GVOKE HYPOPEN 2-PACK) 0.5 MG/0.1ML SOAJ Inject 1 each into the skin as needed.    glucose blood (ACCU-CHEK GUIDE) test strip USE TO TEST TWICE DAILY AS DIRECTED    hydrochlorothiazide (HYDRODIURIL) 12.5 MG tablet TAKE 1 TABLET(12.5 MG) BY MOUTH DAILY    HYDROmorphone (DILAUDID) 4 MG tablet Take 4  mg by mouth 4 (four) times daily as needed for severe pain.    hydrOXYzine (ATARAX/VISTARIL) 25 MG tablet Take 25 mg by mouth in the morning, at noon, in the evening, and at bedtime.    lidocaine (XYLOCAINE) 2 % solution Use as directed 15 mLs in the mouth or throat 3 (three) times a week.     Lidocaine 4 % PTCH Place 1 patch onto the skin in the morning and at bedtime.    LORazepam (ATIVAN) 2 MG tablet Take 2 mg by mouth 4 (four) times daily as needed for anxiety.     losartan (COZAAR) 25 MG tablet Take 1 tablet (25 mg total) by mouth daily.    meclizine (ANTIVERT) 12.5 MG tablet TAKE 1 TABLET BY MOUTH 3 TIMES DAILY AS NEEDED FOR DIZZINESS.    Multiple Vitamin  (MULTIVITAMIN WITH MINERALS) TABS tablet Take 1 tablet by mouth daily.     NARCAN 4 MG/0.1ML LIQD nasal spray kit Place 1 spray into the nose as directed.    Norgestimate-Ethinyl Estradiol Triphasic (TRI-SPRINTEC) 0.18/0.215/0.25 MG-35 MCG tablet Take 1 tablet by mouth daily.    ondansetron (ZOFRAN ODT) 4 MG disintegrating tablet 32m ODT q4 hours prn nausea/vomit    ondansetron (ZOFRAN) 4 MG tablet Take 4 mg by mouth as needed for nausea.     OneTouch Delica Lancets 363SMISC Use as directed to check 1 time per day dx: e11.65    ONETOUCH VERIO test strip Use as directed to check 1 time per day dx: e11.65    PAIN MANAGEMENT INTRATHECAL, IT, PUMP 1 each by Intrathecal route continuous. Intrathecal (IT) medication: fentanyl, clonidine, bupivicaine  Fentanyl 2571mbupivicaine150mg/clonidine2mg  51.9754mer day, 2mg64mr day, 1.44mc35m.4ml/q41m    polyethylene glycol powder (GLYCOLAX/MIRALAX) 17 GM/SCOOP powder MIX 17 GRAMS IN 8 OUNCES OF LIQUID AND DRINK DAILY FOR BOWELS    promethazine (PHENERGAN) 25 MG tablet Take 1 tablet (25 mg total) by mouth every 6 (six) hours as needed for nausea or vomiting.    QUEtiapine (SEROQUEL XR) 400 MG 24 hr tablet Take 400 mg by mouth 2 (two) times daily.    QUEtiapine (SEROQUEL) 300 MG tablet Take 300 mg by mouth at bedtime.    sitaGLIPtin-metformin (JANUMET) 50-1000 MG tablet Take 1 tablet by mouth 2 (two) times daily with a meal.    tiZANidine (ZANAFLEX) 4 MG tablet Take 4 mg by mouth every 4 (four) hours.    traZODone (DESYREL) 100 MG tablet 5 qhs prescribed by psychiatry (Patient taking differently: Take 100-200 mg by mouth at bedtime. 5 qhs prescribed by psychiatry)    triamcinolone cream (KENALOG) 0.1 % Apply 1 application topically daily as needed (for itching).    TRINTELLIX 20 MG TABS tablet Take 20 mg by mouth daily.    VRAYLAR 6 MG CAPS Take 6 mg by mouth daily. 06/21/2020: D/C by psychiatrist Lida PShelbie Hutchingatient    No facility-administered encounter  medications on file as of 06/01/2021.    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   Hypokalemia 03/18/2018   Intractable back  pain 01/03/2018   HTN (hypertension) 01/03/2018   Type 2 diabetes mellitus without complication ([place]) 91/69/4503   Vitamin D deficiency 07/11/2017   Postmenopausal 04/24/2017   Microalbuminuria 01/14/2017   Dyspareunia in female 12/10/2016   Endometriosis determined by laparoscopy 12/10/2016   History of exploratory laparotomy 12/10/2016   Pelvic pain 12/10/2016   Bipolar 1 disorder ([place]) 09/19/2016   Cyst of right ovary 09/19/2016   Severe episode of recurrent major depressive disorder, with psychotic features ([place]) 09/19/2016   Schizoaffective disorder, bipolar type ([place]) 09/19/2016   Chronic pain 03/05/2016   Non morbid obesity due to excess calories 03/05/2016   S/P lumbar fusion 03/05/2016    Conditions to be addressed/monitored: HTN, DMII, and Hashimoto's Disease ; Transportation and Limited access to caregiver  Care Plan : Social Work [place]  Updates made by Daneen Schick since 06/01/2021 12:00 AM     Problem: Barriers to Treatment      Goal: Barriers to Treatment Identified and Managed   Start Date: 02/27/2021  Expected End Date: 04/13/2021  Recent Progress: On track  Priority: High  Note:   Late entry from 9.7.22 Current Barriers:  Chronic disease management support and education needs related to HTN, DM, and Hashimoto's Disease   Transportation  Social Worker Clinical Goal(s):  patient will work with SW to identify and address  any acute and/or chronic care coordination needs related to the self health management of HTN, DM, and Hashimoto's Disease   patient will work with SW to address concerns related to transportation barriers for physician appointments  SW Interventions:  Inter-disciplinary care team collaboration (see longitudinal plan of care) Collaboration with [name], [place] regarding development and update of comprehensive plan of care as evidenced by provider attestation and co-signature Attempted to contact [facility] [employer]) by phone to assess status of patient application - office closed in observance of [place] Day Collaboration with [facility] Callie Fielding via e-mail correspondence requesting feedback of application status Successful outbound call placed to the patient to advise SW has submitted her transportation application and is awaiting determination on eligibility Discussed the patient has yet to complete PCS in home assessment as she has not rescheduled since cancelling the previous assessment date due to a conflict with a physician appointment Encouraged the patient to contact [facility] on Monday 11.14 to schedule assessment - patient stated understanding  Patient Goals/Self-Care Activities patient will:   - [place] at [phone] to reschedule in home assessment -Contact SW as needed prior to next scheduled call  Follow Up Plan: The care management team will reach out to the patient again over the next 20 days.        Follow Up Plan: SW will follow up with patient by phone over the next 21 days      Daneen Schick, BSW, CDP Social Worker, Certified Dementia Practitioner [place] [facility] [place] [facility] [phone]

## 2021-06-01 NOTE — Patient Instructions (Signed)
Social Worker Visit Information  Goals we discussed today:   Goals Addressed             This Visit's Progress    Barriers to Treatment Identified and Managed       Timeframe:  Short-Term Goal Priority:  High Start Date:  8.9.22                                Next planned outreach: 11.16.22            Patient Goals/Self-Care Activities patient will:   - [place] at [phone] to reschedule in home assessment -Contact SW as needed prior to next scheduled call         Materials Provided: Verbal education about community resources provided by phone  Patient verbalizes understanding of instructions provided today and agrees to view in [place].   Follow Up Plan: SW will follow up with patient by phone over the next 21 days  Daneen Schick, BSW, CDP Social Worker, Certified Dementia Practitioner [place] [facility] [place] [facility] [phone]

## 2021-06-04 ENCOUNTER — Encounter: Payer: Medicare HMO | Admitting: Obstetrics and Gynecology

## 2021-06-06 ENCOUNTER — Ambulatory Visit: Payer: MEDICARE

## 2021-06-06 DIAGNOSIS — I10 Essential (primary) hypertension: Secondary | ICD-10-CM

## 2021-06-06 DIAGNOSIS — E063 Autoimmune thyroiditis: Secondary | ICD-10-CM

## 2021-06-06 DIAGNOSIS — E119 Type 2 diabetes mellitus without complications: Secondary | ICD-10-CM

## 2021-06-06 NOTE — Chronic Care Management (AMB) (Signed)
Chronic Care Management    Social Work Note  06/06/2021 Name: Linda Mercado MRN: [id number] DOB: 1966/03/23  Linda Mercado is a 55 y.o. year old female who is a primary care patient of [name], [place]. The CCM team was consulted to assist the patient with chronic disease management and/or care coordination needs related to:  DM II, HTN, Hashimoto's Disease .   Collaboration with [employer] and [employer]  for  case collaboration  in response to provider referral for social work chronic care management and care coordination services.   Consent to Services:  The patient was given information about Chronic Care Management services, agreed to services, and gave verbal consent prior to initiation of services.  Please see initial visit note for detailed documentation.   Patient agreed to services and consent obtained.   Assessment: Review of patient past medical history, allergies, medications, and health status, including review of relevant consultants reports was performed today as part of a comprehensive evaluation and provision of chronic care management and care coordination services.     SDOH (Social Determinants of Health) assessments and interventions performed:    Advanced Directives Status: Not addressed in this encounter.  CCM Care Plan  Allergies  Allergen Reactions   Lisinopril Hives    Other reaction(s): anaphylaxis/angioedema   Latex Hives   Morphine And Related     Itching, skin get hot.    Adhesive [Tape] Rash    Outpatient Encounter Medications as of 06/06/2021  Medication Sig Note   albuterol (VENTOLIN HFA) 108 (90 Base) MCG/ACT inhaler Inhale 2 puffs into the lungs every 6 (six) hours as needed for wheezing or shortness of breath.    Alcohol Swabs PADS Use as directed to check blood sugars    AMITIZA 24 MCG capsule Take 24 mcg by mouth 2 (two) times daily.     atenolol (TENORMIN) 100 MG tablet TAKE 1 TABLET(100 MG) BY MOUTH DAILY     atorvastatin (LIPITOR) 10 MG tablet TAKE 1 TABLET(10 MG) BY MOUTH AT BEDTIME    BD PEN NEEDLE NANO 2ND GEN 32G X 4 MM MISC USE AS DIRECTED WITH TRESIBA DAILY    benzonatate (TESSALON PERLES) 100 MG capsule Take 1 capsule (100 mg total) by mouth every 6 (six) hours as needed.    Blood Glucose Monitoring Suppl (ONETOUCH VERIO) w/Device KIT Use as directed to check 1 time per day dx: e11.65    calcium gluconate 500 MG tablet Take 1 tablet (500 mg total) by mouth 3 (three) times daily.    CAPLYTA 42 MG CAPS Take 42 mg by mouth at bedtime.     clonazePAM (KLONOPIN) 1 MG tablet Take 1 mg by mouth 3 (three) times daily.    Cyanocobalamin (B-12) 500 MCG SUBL One SL QD    dexlansoprazole (DEXILANT) 60 MG capsule TAKE 1 CAPSULE BY MOUTH EVERY DAY    diclofenac Sodium (VOLTAREN) 1 % GEL APPLY 4 GRAMS TO THE AFFECTED AREA FOUR TIMES DAILY    docusate sodium (COLACE) 50 MG capsule Take 50 mg by mouth daily.     FLOVENT HFA 110 MCG/ACT inhaler INHALE 2 PUFFS INTO THE LUNGS EVERY 12 HOURS    Glucagon (GVOKE HYPOPEN 2-PACK) 0.5 MG/0.1ML SOAJ Inject 1 each into the skin as needed.    glucose blood (ACCU-CHEK GUIDE) test strip USE TO TEST TWICE DAILY AS DIRECTED    hydrochlorothiazide (HYDRODIURIL) 12.5 MG tablet TAKE 1 TABLET(12.5 MG) BY MOUTH DAILY    HYDROmorphone (DILAUDID)  4 MG tablet Take 4 mg by mouth 4 (four) times daily as needed for severe pain.    hydrOXYzine (ATARAX/VISTARIL) 25 MG tablet Take 25 mg by mouth in the morning, at noon, in the evening, and at bedtime.    lidocaine (XYLOCAINE) 2 % solution Use as directed 15 mLs in the mouth or throat 3 (three) times a week.     Lidocaine 4 % PTCH Place 1 patch onto the skin in the morning and at bedtime.    LORazepam (ATIVAN) 2 MG tablet Take 2 mg by mouth 4 (four) times daily as needed for anxiety.     losartan (COZAAR) 25 MG tablet Take 1 tablet (25 mg total) by mouth daily.    meclizine (ANTIVERT) 12.5 MG tablet TAKE 1 TABLET BY MOUTH 3 TIMES DAILY  AS NEEDED FOR DIZZINESS.    Multiple Vitamin (MULTIVITAMIN WITH MINERALS) TABS tablet Take 1 tablet by mouth daily.     NARCAN 4 MG/0.1ML LIQD nasal spray kit Place 1 spray into the nose as directed.    Norgestimate-Ethinyl Estradiol Triphasic (TRI-SPRINTEC) 0.18/0.215/0.25 MG-35 MCG tablet Take 1 tablet by mouth daily.    ondansetron (ZOFRAN ODT) 4 MG disintegrating tablet 43m ODT q4 hours prn nausea/vomit    ondansetron (ZOFRAN) 4 MG tablet Take 4 mg by mouth as needed for nausea.     OneTouch Delica Lancets 388CMISC Use as directed to check 1 time per day dx: e11.65    ONETOUCH VERIO test strip Use as directed to check 1 time per day dx: e11.65    PAIN MANAGEMENT INTRATHECAL, IT, PUMP 1 each by Intrathecal route continuous. Intrathecal (IT) medication: fentanyl, clonidine, bupivicaine  Fentanyl 2518mbupivicaine150mg/clonidine2mg  51.9753mer day, 2mg32mr day, 1.44mc19m.4ml/q52m    polyethylene glycol powder (GLYCOLAX/MIRALAX) 17 GM/SCOOP powder MIX 17 GRAMS IN 8 OUNCES OF LIQUID AND DRINK DAILY FOR BOWELS    promethazine (PHENERGAN) 25 MG tablet Take 1 tablet (25 mg total) by mouth every 6 (six) hours as needed for nausea or vomiting.    QUEtiapine (SEROQUEL XR) 400 MG 24 hr tablet Take 400 mg by mouth 2 (two) times daily.    QUEtiapine (SEROQUEL) 300 MG tablet Take 300 mg by mouth at bedtime.    sitaGLIPtin-metformin (JANUMET) 50-1000 MG tablet Take 1 tablet by mouth 2 (two) times daily with a meal.    tiZANidine (ZANAFLEX) 4 MG tablet Take 4 mg by mouth every 4 (four) hours.    traZODone (DESYREL) 100 MG tablet 5 qhs prescribed by psychiatry (Patient taking differently: Take 100-200 mg by mouth at bedtime. 5 qhs prescribed by psychiatry)    triamcinolone cream (KENALOG) 0.1 % Apply 1 application topically daily as needed (for itching).    TRINTELLIX 20 MG TABS tablet Take 20 mg by mouth daily.    VRAYLAR 6 MG CAPS Take 6 mg by mouth daily. 06/21/2020: D/C by psychiatrist Lida PShelbie Hutchingpatient    No facility-administered encounter medications on file as of 06/06/2021.    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   Hypokalemia  03/18/2018   Intractable back pain 01/03/2018   HTN (hypertension) 01/03/2018   Type 2 diabetes mellitus without complication ([place]) 33/58/2518   Vitamin D deficiency 07/11/2017   Postmenopausal 04/24/2017   Microalbuminuria 01/14/2017   Dyspareunia in female 12/10/2016   Endometriosis determined by laparoscopy 12/10/2016   History of exploratory laparotomy 12/10/2016   Pelvic pain 12/10/2016   Bipolar 1 disorder ([place]) 09/19/2016   Cyst of right ovary 09/19/2016   Severe episode of recurrent major depressive disorder, with psychotic features ([place]) 09/19/2016   Schizoaffective disorder, bipolar type ([place]) 09/19/2016   Chronic pain 03/05/2016   Non morbid obesity due to excess calories 03/05/2016   S/P lumbar fusion 03/05/2016    Conditions to be addressed/monitored: HTN, DMII, and Hashimoto's Disease ; Transportation  Care Plan : Social Work [place]  Updates made by Daneen Schick since 06/06/2021 12:00 AM     Problem: Barriers to Treatment      Goal: Barriers to Treatment Identified and Managed   Start Date: 02/27/2021  Expected End Date: 04/13/2021  This Visit's Progress: On track  Recent Progress: On track  Priority: High  Note:   Late entry from 9.7.22 Current Barriers:  Chronic disease management support and education needs related to HTN, DM, and Hashimoto's Disease   Transportation  Social Worker Clinical Goal(s):   patient will work with SW to identify and address any acute and/or chronic care coordination needs related to the self health management of HTN, DM, and Hashimoto's Disease   patient will work with SW to address concerns related to transportation barriers for physician appointments  SW Interventions:  Inter-disciplinary care team collaboration (see longitudinal plan of care) Collaboration with [name], [place] regarding development and update of comprehensive plan of care as evidenced by provider attestation and co-signature Collaboration with Linus Orn from [facility] and [employer] to determine patient is approved to access this service for transportation to her pain management clinic in [place] Discussed patient will need to utilize [employer] for all transportation needs within the city of [place] Unsuccessful outbound call attempted to the patient - unable to leave a voice message due to patients voice mailbox being full  Patient Goals/Self-Care Activities patient will:   - [place] at [phone] to reschedule in home assessment -[place] [phone]) for transportation to pain management clinic -Contact SW as needed prior to next scheduled call  Follow Up Plan: The care management team will reach out to the patient again over the next 10 days.        Follow Up Plan: SW will follow up with patient by phone over the next 5 days      Daneen Schick, BSW, CDP Social Worker, Certified Dementia Practitioner [place] / [place] [facility] [phone]

## 2021-06-06 NOTE — Patient Instructions (Signed)
Social Worker Visit Information  Goals we discussed today:   Goals Addressed             This Visit's Progress    Barriers to Treatment Identified and Managed       Timeframe:  Short-Term Goal Priority:  High Start Date:  8.9.22                                Next planned outreach: 11.18.22            Patient Goals/Self-Care Activities patient will:   - [place] at [phone] to reschedule in home assessment -[place] [phone]) for transportation to pain management clinic -Contact SW as needed prior to next scheduled call         Materials Provided: No. Patient not reached.   Follow Up Plan: SW will follow up with patient by phone over the next 5 days   Daneen Schick, BSW, CDP Social Worker, Certified Dementia Practitioner [place] / [place] [facility] [phone]

## 2021-06-08 ENCOUNTER — Telehealth: Payer: MEDICARE

## 2021-06-08 ENCOUNTER — Telehealth: Payer: Self-pay

## 2021-06-08 NOTE — Telephone Encounter (Signed)
  Care Management   Follow Up Note   06/08/2021 Name: Linda Mercado MRN: [id number] DOB: June 26, 1966   Referred by: [name], FNP Reason for referral : Chronic Care Management (Unsuccessful call)   A second unsuccessful telephone outreach was attempted today. The patient was referred to the case management team for assistance with care management and care coordination. SW unable to leave a HIPAA compliant voice message due to the patients mailbox being full.  Follow Up Plan: The care management team will reach out to the patient again over the next 21 days.   Daneen Schick, BSW, CDP Social Worker, Certified Dementia Practitioner [place] / [place] [facility] [phone]

## 2021-06-11 ENCOUNTER — Ambulatory Visit: Payer: MEDICARE | Admitting: Nurse Practitioner

## 2021-06-13 ENCOUNTER — Telehealth: Payer: Self-pay

## 2021-06-13 ENCOUNTER — Telehealth: Payer: MEDICARE

## 2021-06-13 NOTE — Telephone Encounter (Signed)
  Care Management   Follow Up Note   06/13/2021 Name: Linda Mercado MRN: [id number] DOB: 1965/12/24   Referred by: [name], FNP Reason for referral : Chronic Care Management (RN CM Follow up call )   An unsuccessful telephone outreach was attempted today. The patient was referred to the case management team for assistance with care management and care coordination. I spoke with Linda Mercado briefly today, unfortunately, she is not available to speak with me due to having increased pain today.   Follow Up Plan: Telephone follow up appointment with care management team member scheduled for: 06/28/21  Barb Merino, RN, BSN, CCM Care Management Coordinator [place] [facility]  Direct Phone: [phone]

## 2021-06-13 NOTE — Progress Notes (Signed)
Chronic Care Management Pharmacy Assistant   Name: Linda Mercado  MRN: [id number] DOB: [birth year]   Reason for Encounter: Disease State/ Hypertension   Recent office visits:  04-17-2021 Daneen Schick (CCM)  05-07-2021 Daneen Schick (CCM)  05-18-2021 Daneen Schick (CCM)  06-01-2021 Daneen Schick (CCM)  06-06-2021 Daneen Schick (CCM)  Recent consult visits:  04-18-2021 Lucia Bitter, MD (Pain medicine). Pumpogram prior to pump replacement.  05-02-2021 Lucia Bitter, MD (Pain medicine). FLuoro/ Bilateral SI Joint injections/ Light sedation procedure.  06-04-2021 Priddy Dondra Prader, ANP (Pain medicine). IT pump refill and follow up after her Si Joint injection   Hospital visits:  Medication Reconciliation was completed by comparing discharge summary, patient's EMR and Pharmacy list, and upon discussion with patient.  Admitted to the hospital on 04-30-2021 due to Abdominal pain.  Discharge date was 04-30-2021. Discharged from [place]?Medications Started at [facility] Discharge:?? Zofran 4 mg every 4 hours as needed  Medication Changes at Hospital Discharge: None  Medications Discontinued at Hospital Discharge: None  Medications that remain the same after Hospital Discharge:??  -All other medications will remain the same.    Medications: Outpatient Encounter Medications as of 06/13/2021  Medication Sig Note   albuterol (VENTOLIN HFA) 108 (90 Base) MCG/ACT inhaler Inhale 2 puffs into the lungs every 6 (six) hours as needed for wheezing or shortness of breath.    Alcohol Swabs PADS Use as directed to check blood sugars    AMITIZA 24 MCG capsule Take 24 mcg by mouth 2 (two) times daily.     atenolol (TENORMIN) 100 MG tablet TAKE 1 TABLET(100 MG) BY MOUTH DAILY    atorvastatin (LIPITOR) 10 MG tablet TAKE 1 TABLET(10 MG) BY MOUTH AT BEDTIME    BD PEN NEEDLE NANO 2ND GEN 32G X 4 MM MISC USE AS DIRECTED WITH TRESIBA DAILY    benzonatate  (TESSALON PERLES) 100 MG capsule Take 1 capsule (100 mg total) by mouth every 6 (six) hours as needed.    Blood Glucose Monitoring Suppl (ONETOUCH VERIO) w/Device KIT Use as directed to check 1 time per day dx: e11.65    calcium gluconate 500 MG tablet Take 1 tablet (500 mg total) by mouth 3 (three) times daily.    CAPLYTA 42 MG CAPS Take 42 mg by mouth at bedtime.     clonazePAM (KLONOPIN) 1 MG tablet Take 1 mg by mouth 3 (three) times daily.    Cyanocobalamin (B-12) 500 MCG SUBL One SL QD    dexlansoprazole (DEXILANT) 60 MG capsule TAKE 1 CAPSULE BY MOUTH EVERY DAY    diclofenac Sodium (VOLTAREN) 1 % GEL APPLY 4 GRAMS TO THE AFFECTED AREA FOUR TIMES DAILY    docusate sodium (COLACE) 50 MG capsule Take 50 mg by mouth daily.     FLOVENT HFA 110 MCG/ACT inhaler INHALE 2 PUFFS INTO THE LUNGS EVERY 12 HOURS    Glucagon (GVOKE HYPOPEN 2-PACK) 0.5 MG/0.1ML SOAJ Inject 1 each into the skin as needed.    glucose blood (ACCU-CHEK GUIDE) test strip USE TO TEST TWICE DAILY AS DIRECTED    hydrochlorothiazide (HYDRODIURIL) 12.5 MG tablet TAKE 1 TABLET(12.5 MG) BY MOUTH DAILY    HYDROmorphone (DILAUDID) 4 MG tablet Take 4 mg by mouth 4 (four) times daily as needed for severe pain.    hydrOXYzine (ATARAX/VISTARIL) 25 MG tablet Take 25 mg by mouth in the morning, at noon, in the evening, and at bedtime.    lidocaine (XYLOCAINE) 2 %  solution Use as directed 15 mLs in the mouth or throat 3 (three) times a week.     Lidocaine 4 % PTCH Place 1 patch onto the skin in the morning and at bedtime.    LORazepam (ATIVAN) 2 MG tablet Take 2 mg by mouth 4 (four) times daily as needed for anxiety.     losartan (COZAAR) 25 MG tablet Take 1 tablet (25 mg total) by mouth daily.    meclizine (ANTIVERT) 12.5 MG tablet TAKE 1 TABLET BY MOUTH 3 TIMES DAILY AS NEEDED FOR DIZZINESS.    Multiple Vitamin (MULTIVITAMIN WITH MINERALS) TABS tablet Take 1 tablet by mouth daily.     NARCAN 4 MG/0.1ML LIQD nasal spray kit Place 1 spray  into the nose as directed.    Norgestimate-Ethinyl Estradiol Triphasic (TRI-SPRINTEC) 0.18/0.215/0.25 MG-35 MCG tablet Take 1 tablet by mouth daily.    ondansetron (ZOFRAN ODT) 4 MG disintegrating tablet 89m ODT q4 hours prn nausea/vomit    ondansetron (ZOFRAN) 4 MG tablet Take 4 mg by mouth as needed for nausea.     OneTouch Delica Lancets 338VMISC Use as directed to check 1 time per day dx: e11.65    ONETOUCH VERIO test strip Use as directed to check 1 time per day dx: e11.65    PAIN MANAGEMENT INTRATHECAL, IT, PUMP 1 each by Intrathecal route continuous. Intrathecal (IT) medication: fentanyl, clonidine, bupivicaine  Fentanyl 2584mbupivicaine150mg/clonidine2mg  51.9767mer day, 2mg45mr day, 1.44mc33m.4ml/q37m    polyethylene glycol powder (GLYCOLAX/MIRALAX) 17 GM/SCOOP powder MIX 17 GRAMS IN 8 OUNCES OF LIQUID AND DRINK DAILY FOR BOWELS    promethazine (PHENERGAN) 25 MG tablet Take 1 tablet (25 mg total) by mouth every 6 (six) hours as needed for nausea or vomiting.    QUEtiapine (SEROQUEL XR) 400 MG 24 hr tablet Take 400 mg by mouth 2 (two) times daily.    QUEtiapine (SEROQUEL) 300 MG tablet Take 300 mg by mouth at bedtime.    sitaGLIPtin-metformin (JANUMET) 50-1000 MG tablet Take 1 tablet by mouth 2 (two) times daily with a meal.    tiZANidine (ZANAFLEX) 4 MG tablet Take 4 mg by mouth every 4 (four) hours.    traZODone (DESYREL) 100 MG tablet 5 qhs prescribed by psychiatry (Patient taking differently: Take 100-200 mg by mouth at bedtime. 5 qhs prescribed by psychiatry)    triamcinolone cream (KENALOG) 0.1 % Apply 1 application topically daily as needed (for itching).    TRINTELLIX 20 MG TABS tablet Take 20 mg by mouth daily.    VRAYLAR 6 MG CAPS Take 6 mg by mouth daily. 06/21/2020: D/C by psychiatrist Lida PShelbie Hutchingatient    No facility-administered encounter medications on file as of 06/13/2021.  Reviewed chart prior to disease state call. Spoke with patient regarding BP  Recent  Office Vitals: BP Readings from Last 3 Encounters:  04/30/21 (!) 169/74  02/07/21 128/74  09/13/20 (!) 142/80   Pulse Readings from Last 3 Encounters:  04/30/21 65  02/07/21 79  09/13/20 80    Wt Readings from Last 3 Encounters:  02/07/21 148 lb 9.6 oz (67.4 kg)  10/25/20 155 lb 6.4 oz (70.5 kg)  09/13/20 164 lb (74.4 kg)     Kidney Function Lab Results  Component Value Date/Time   CREATININE 1.08 (H) 04/30/2021 04:00 PM   CREATININE 0.85 02/07/2021 11:36 AM   GFRNONAA >60 04/30/2021 04:00 PM   GFRAA 60 08/24/2020 03:51 PM    BMP Latest Ref Rng & Units 04/30/2021 02/07/2021 10/25/2020  Glucose 70 - 99 mg/dL 84 111(H) 68  BUN 6 - 20 mg/dL _0 Creatinine 0.44 - 1.00 mg/dL 1.08(H) 0.85 0.95  BUN/Creat Ratio 9 - 23 - 11 12  Sodium 135 - 145 mmol/L 137 140 143  Potassium 3.5 - 5.1 mmol/L 3.4(L) 3.8 3.4(L)  Chloride 98 - 111 mmol/L 103 99 99  CO2 22 - 32 mmol/L _1 Calcium 8.9 - 10.3 mg/dL 9.2 10.0 9.5    06-13-2021: 1st attempt left VM 06-19-2021: 2nd attempt left VM  Care Gaps: PNA Vac overdue Shingrix overdue  Flu vaccine overdue Covid booster overdue AWV 09-19-2021  Star Rating Drugs: Losartan 50 mg- Last filled 04-22-2021 [facility] Atorvastatin 10 mg- Last filled 05-06-2021 [facility] Janumet 50-1000 mg- Last filled 05-22-2021 [facility]  [place] Clinical Pharmacist Assistant [phone]

## 2021-06-20 ENCOUNTER — Telehealth: Payer: MEDICARE

## 2021-06-20 ENCOUNTER — Ambulatory Visit: Payer: Self-pay

## 2021-06-20 DIAGNOSIS — I10 Essential (primary) hypertension: Secondary | ICD-10-CM

## 2021-06-20 DIAGNOSIS — E119 Type 2 diabetes mellitus without complications: Secondary | ICD-10-CM

## 2021-06-20 NOTE — Patient Instructions (Signed)
Social Worker Visit Information  Goals we discussed today:   Goals Addressed             This Visit's Progress    COMPLETED: Barriers to Treatment Identified and Managed       Timeframe:  Short-Term Goal Priority:  High Start Date:  8.9.22                                           Patient Goals/Self-Care Activities patient will:   - [place] at [phone] to reschedule in home assessment -[place] [phone]) for transportation to pain management clinic -Contact SW as needed prior to next scheduled call         Materials Provided: No. Patient not reached.   Follow Up Plan:  No SW follow up planned at this time.  Daneen Schick, BSW, CDP Social Worker, Certified Dementia Practitioner [place] / [place] [facility] [phone]

## 2021-06-20 NOTE — Chronic Care Management (AMB) (Signed)
Chronic Care Management    Social Work Note  06/20/2021 Name: Linda Mercado MRN: [id number] DOB: 1965/09/26  Linda Mercado is a 55 y.o. year old female who is a primary care patient of [name], [place]. The CCM team was consulted to assist the patient with chronic disease management and/or care coordination needs related to:  DM II, HTN .   Third unsuccessful outbound call placed to the patient  for follow up visit in response to provider referral for social work chronic care management and care coordination services. SW performed a discipline closure due to inability to maintain patient contact.  Consent to Services:  The patient was given information about Chronic Care Management services, agreed to services, and gave verbal consent prior to initiation of services.  Please see initial visit note for detailed documentation.   Patient agreed to services and consent obtained.   Assessment: Review of patient past medical history, allergies, medications, and health status, including review of relevant consultants reports was performed today as part of a comprehensive evaluation and provision of chronic care management and care coordination services.     SDOH (Social Determinants of Health) assessments and interventions performed:    Advanced Directives Status: Not addressed in this encounter.  CCM Care Plan  Allergies  Allergen Reactions   Lisinopril Hives    Other reaction(s): anaphylaxis/angioedema   Latex Hives   Morphine And Related     Itching, skin get hot.    Adhesive [Tape] Rash    Outpatient Encounter Medications as of 06/20/2021  Medication Sig Note   albuterol (VENTOLIN HFA) 108 (90 Base) MCG/ACT inhaler Inhale 2 puffs into the lungs every 6 (six) hours as needed for wheezing or shortness of breath.    Alcohol Swabs PADS Use as directed to check blood sugars    AMITIZA 24 MCG capsule Take 24 mcg by mouth 2 (two) times daily.     atenolol (TENORMIN) 100 MG tablet  TAKE 1 TABLET(100 MG) BY MOUTH DAILY    atorvastatin (LIPITOR) 10 MG tablet TAKE 1 TABLET(10 MG) BY MOUTH AT BEDTIME    BD PEN NEEDLE NANO 2ND GEN 32G X 4 MM MISC USE AS DIRECTED WITH TRESIBA DAILY    benzonatate (TESSALON PERLES) 100 MG capsule Take 1 capsule (100 mg total) by mouth every 6 (six) hours as needed.    Blood Glucose Monitoring Suppl (ONETOUCH VERIO) w/Device KIT Use as directed to check 1 time per day dx: e11.65    calcium gluconate 500 MG tablet Take 1 tablet (500 mg total) by mouth 3 (three) times daily.    CAPLYTA 42 MG CAPS Take 42 mg by mouth at bedtime.     clonazePAM (KLONOPIN) 1 MG tablet Take 1 mg by mouth 3 (three) times daily.    Cyanocobalamin (B-12) 500 MCG SUBL One SL QD    dexlansoprazole (DEXILANT) 60 MG capsule TAKE 1 CAPSULE BY MOUTH EVERY DAY    diclofenac Sodium (VOLTAREN) 1 % GEL APPLY 4 GRAMS TO THE AFFECTED AREA FOUR TIMES DAILY    docusate sodium (COLACE) 50 MG capsule Take 50 mg by mouth daily.     FLOVENT HFA 110 MCG/ACT inhaler INHALE 2 PUFFS INTO THE LUNGS EVERY 12 HOURS    Glucagon (GVOKE HYPOPEN 2-PACK) 0.5 MG/0.1ML SOAJ Inject 1 each into the skin as needed.    glucose blood (ACCU-CHEK GUIDE) test strip USE TO TEST TWICE DAILY AS DIRECTED    hydrochlorothiazide (HYDRODIURIL) 12.5 MG tablet TAKE 1 TABLET(12.5 MG)  BY MOUTH DAILY    HYDROmorphone (DILAUDID) 4 MG tablet Take 4 mg by mouth 4 (four) times daily as needed for severe pain.    hydrOXYzine (ATARAX/VISTARIL) 25 MG tablet Take 25 mg by mouth in the morning, at noon, in the evening, and at bedtime.    lidocaine (XYLOCAINE) 2 % solution Use as directed 15 mLs in the mouth or throat 3 (three) times a week.     Lidocaine 4 % PTCH Place 1 patch onto the skin in the morning and at bedtime.    LORazepam (ATIVAN) 2 MG tablet Take 2 mg by mouth 4 (four) times daily as needed for anxiety.     losartan (COZAAR) 25 MG tablet Take 1 tablet (25 mg total) by mouth daily.    meclizine (ANTIVERT) 12.5 MG tablet  TAKE 1 TABLET BY MOUTH 3 TIMES DAILY AS NEEDED FOR DIZZINESS.    Multiple Vitamin (MULTIVITAMIN WITH MINERALS) TABS tablet Take 1 tablet by mouth daily.     NARCAN 4 MG/0.1ML LIQD nasal spray kit Place 1 spray into the nose as directed.    Norgestimate-Ethinyl Estradiol Triphasic (TRI-SPRINTEC) 0.18/0.215/0.25 MG-35 MCG tablet Take 1 tablet by mouth daily.    ondansetron (ZOFRAN ODT) 4 MG disintegrating tablet 4m ODT q4 hours prn nausea/vomit    ondansetron (ZOFRAN) 4 MG tablet Take 4 mg by mouth as needed for nausea.     OneTouch Delica Lancets 367JMISC Use as directed to check 1 time per day dx: e11.65    ONETOUCH VERIO test strip Use as directed to check 1 time per day dx: e11.65    PAIN MANAGEMENT INTRATHECAL, IT, PUMP 1 each by Intrathecal route continuous. Intrathecal (IT) medication: fentanyl, clonidine, bupivicaine  Fentanyl 2569mbupivicaine150mg/clonidine2mg  51.9743mer day, 2mg30mr day, 1.44mc5m.4ml/q70m    polyethylene glycol powder (GLYCOLAX/MIRALAX) 17 GM/SCOOP powder MIX 17 GRAMS IN 8 OUNCES OF LIQUID AND DRINK DAILY FOR BOWELS    promethazine (PHENERGAN) 25 MG tablet Take 1 tablet (25 mg total) by mouth every 6 (six) hours as needed for nausea or vomiting.    QUEtiapine (SEROQUEL XR) 400 MG 24 hr tablet Take 400 mg by mouth 2 (two) times daily.    QUEtiapine (SEROQUEL) 300 MG tablet Take 300 mg by mouth at bedtime.    sitaGLIPtin-metformin (JANUMET) 50-1000 MG tablet Take 1 tablet by mouth 2 (two) times daily with a meal.    tiZANidine (ZANAFLEX) 4 MG tablet Take 4 mg by mouth every 4 (four) hours.    traZODone (DESYREL) 100 MG tablet 5 qhs prescribed by psychiatry (Patient taking differently: Take 100-200 mg by mouth at bedtime. 5 qhs prescribed by psychiatry)    triamcinolone cream (KENALOG) 0.1 % Apply 1 application topically daily as needed (for itching).    TRINTELLIX 20 MG TABS tablet Take 20 mg by mouth daily.    VRAYLAR 6 MG CAPS Take 6 mg by mouth daily. 06/21/2020:  D/C by psychiatrist Lida PShelbie Hutchingatient    No facility-administered encounter medications on file as of 06/20/2021.    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   Hypokalemia 03/18/2018   Intractable back pain 01/03/2018   HTN (hypertension) 01/03/2018   Type 2 diabetes mellitus without complication ([place]) 03/40/3524   Vitamin D deficiency 07/11/2017   Postmenopausal 04/24/2017   Microalbuminuria 01/14/2017   Dyspareunia in female 12/10/2016   Endometriosis determined by laparoscopy 12/10/2016   History of exploratory laparotomy 12/10/2016   Pelvic pain 12/10/2016   Bipolar 1 disorder ([place]) 09/19/2016   Cyst of right ovary 09/19/2016   Severe episode of recurrent major depressive disorder, with psychotic features ([place]) 09/19/2016   Schizoaffective disorder, bipolar type ([place]) 09/19/2016   Chronic pain 03/05/2016   Non morbid obesity due to excess calories 03/05/2016   S/P lumbar fusion 03/05/2016    Conditions to be addressed/monitored: HTN and DMII; Limited access to caregiver  Care Plan : Social Work [place]  Updates made by Daneen Schick since 06/20/2021 12:00 AM  Completed 06/20/2021   Problem: Barriers to Treatment Resolved 06/20/2021     Goal: Barriers to Treatment Identified and Managed Completed 06/20/2021  Start Date: 02/27/2021  Expected End Date: 04/13/2021  Recent Progress: On track  Priority: High  Note:   Late entry from 9.7.22 Current Barriers:  Chronic disease management support and education needs related to HTN, DM, and Hashimoto's Disease    Transportation  Social Worker Clinical Goal(s):  patient will work with SW to identify and address any acute and/or chronic care coordination needs related to the self health management of HTN, DM, and Hashimoto's Disease   patient will work with SW to address concerns related to transportation barriers for physician appointments  SW Interventions:  Inter-disciplinary care team collaboration (see longitudinal plan of care) Collaboration with [name], [employer] regarding development and update of comprehensive plan of care as evidenced by provider attestation and co-signature Third unsuccessful outbound call placed to the patient to assist with care coordination needs Performed discipline closure due to inability to maintain patient contact  Patient Goals/Self-Care Activities patient will:   - [place] at [phone] to reschedule in home assessment -[place] [phone]) for transportation to pain management clinic -Contact SW as needed prior to next scheduled call        Follow Up Plan:  No SW follow up planned. Patient will remain engaged with RN Care Manager to address care management needs.      Daneen Schick, BSW, CDP Social Worker, Certified Dementia Practitioner [place] [facility] [place] [facility] [phone]

## 2021-06-20 NOTE — Telephone Encounter (Signed)
Error

## 2021-06-23 ENCOUNTER — Other Ambulatory Visit: Payer: Self-pay | Admitting: Nurse Practitioner

## 2021-06-23 DIAGNOSIS — K219 Gastro-esophageal reflux disease without esophagitis: Secondary | ICD-10-CM

## 2021-06-28 ENCOUNTER — Ambulatory Visit: Payer: Self-pay

## 2021-06-28 ENCOUNTER — Telehealth: Payer: MEDICARE

## 2021-06-28 ENCOUNTER — Ambulatory Visit: Payer: MEDICARE

## 2021-06-28 DIAGNOSIS — E063 Autoimmune thyroiditis: Secondary | ICD-10-CM

## 2021-06-28 DIAGNOSIS — M5416 Radiculopathy, lumbar region: Secondary | ICD-10-CM

## 2021-06-28 DIAGNOSIS — E119 Type 2 diabetes mellitus without complications: Secondary | ICD-10-CM

## 2021-06-28 DIAGNOSIS — K219 Gastro-esophageal reflux disease without esophagitis: Secondary | ICD-10-CM

## 2021-06-28 DIAGNOSIS — G894 Chronic pain syndrome: Secondary | ICD-10-CM

## 2021-06-28 DIAGNOSIS — I10 Essential (primary) hypertension: Secondary | ICD-10-CM

## 2021-06-28 NOTE — Patient Instructions (Signed)
Social Worker Visit Information  Goals we discussed today:   Goals Addressed             This Visit's Progress    Identify Transportation Resources       Timeframe:  Short-Term Goal Priority:  High Start Date:  12.8.22                              Patient Goals/Self-Care Activities patient will:   - Review mailed resource information -Engage with SW to address transportation resource needs                            Materials Provided: Yes: mailed patient information on transportation resource   Follow Up Plan: SW will follow up with patient by phone over the next 10 days  Daneen Schick, Arita Miss, CDP Social Worker, Certified Dementia Practitioner [place] [facility] [place] [facility] [phone]

## 2021-06-28 NOTE — Chronic Care Management (AMB) (Signed)
Chronic Care Management    Social Work Note  06/28/2021 Name: Linda Mercado MRN: [id number] DOB: Feb 19, 1966  Linda Mercado is a 55 y.o. year old female who is a primary care patient of [name], [place]. The CCM team was consulted to assist the patient with chronic disease management and/or care coordination needs related to:  DM II, HTN, Hashimoto's Disease .   Collaboration with Consulting civil engineer  for  Case Collaboration  in response to provider referral for social work chronic care management and care coordination services.   Consent to Services:  The patient was given information about Chronic Care Management services, agreed to services, and gave verbal consent prior to initiation of services.  Please see initial visit note for detailed documentation.   Patient agreed to services and consent obtained.   Assessment: Review of patient past medical history, allergies, medications, and health status, including review of relevant consultants reports was performed today as part of a comprehensive evaluation and provision of chronic care management and care coordination services.     SDOH (Social Determinants of Health) assessments and interventions performed:    Advanced Directives Status: Not addressed in this encounter.  CCM Care Plan  Allergies  Allergen Reactions   Lisinopril Hives    Other reaction(s): anaphylaxis/angioedema   Latex Hives   Morphine And Related     Itching, skin get hot.    Adhesive [Tape] Rash    Outpatient Encounter Medications as of 06/28/2021  Medication Sig Note   albuterol (VENTOLIN HFA) 108 (90 Base) MCG/ACT inhaler Inhale 2 puffs into the lungs every 6 (six) hours as needed for wheezing or shortness of breath.    Alcohol Swabs PADS Use as directed to check blood sugars    AMITIZA 24 MCG capsule Take 24 mcg by mouth 2 (two) times daily.     atenolol (TENORMIN) 100 MG tablet TAKE 1 TABLET(100 MG) BY MOUTH DAILY    atorvastatin (LIPITOR) 10 MG tablet  TAKE 1 TABLET(10 MG) BY MOUTH AT BEDTIME    BD PEN NEEDLE NANO 2ND GEN 32G X 4 MM MISC USE AS DIRECTED WITH TRESIBA DAILY    benzonatate (TESSALON PERLES) 100 MG capsule Take 1 capsule (100 mg total) by mouth every 6 (six) hours as needed.    Blood Glucose Monitoring Suppl (ONETOUCH VERIO) w/Device KIT Use as directed to check 1 time per day dx: e11.65    calcium gluconate 500 MG tablet Take 1 tablet (500 mg total) by mouth 3 (three) times daily.    CAPLYTA 42 MG CAPS Take 42 mg by mouth at bedtime.     clonazePAM (KLONOPIN) 1 MG tablet Take 1 mg by mouth 3 (three) times daily.    Cyanocobalamin (B-12) 500 MCG SUBL One SL QD    dexlansoprazole (DEXILANT) 60 MG capsule Take 1 capsule (60 mg total) by mouth daily. Patient will need appt for further refills    diclofenac Sodium (VOLTAREN) 1 % GEL APPLY 4 GRAMS TO THE AFFECTED AREA FOUR TIMES DAILY    docusate sodium (COLACE) 50 MG capsule Take 50 mg by mouth daily.     FLOVENT HFA 110 MCG/ACT inhaler INHALE 2 PUFFS INTO THE LUNGS EVERY 12 HOURS    Glucagon (GVOKE HYPOPEN 2-PACK) 0.5 MG/0.1ML SOAJ Inject 1 each into the skin as needed.    glucose blood (ACCU-CHEK GUIDE) test strip USE TO TEST TWICE DAILY AS DIRECTED    hydrochlorothiazide (HYDRODIURIL) 12.5 MG tablet TAKE 1 TABLET(12.5 MG) BY MOUTH  DAILY    HYDROmorphone (DILAUDID) 4 MG tablet Take 4 mg by mouth 4 (four) times daily as needed for severe pain.    hydrOXYzine (ATARAX/VISTARIL) 25 MG tablet Take 25 mg by mouth in the morning, at noon, in the evening, and at bedtime.    lidocaine (XYLOCAINE) 2 % solution Use as directed 15 mLs in the mouth or throat 3 (three) times a week.     Lidocaine 4 % PTCH Place 1 patch onto the skin in the morning and at bedtime.    LORazepam (ATIVAN) 2 MG tablet Take 2 mg by mouth 4 (four) times daily as needed for anxiety.     losartan (COZAAR) 25 MG tablet Take 1 tablet (25 mg total) by mouth daily.    meclizine (ANTIVERT) 12.5 MG tablet TAKE 1 TABLET BY MOUTH  3 TIMES DAILY AS NEEDED FOR DIZZINESS.    Multiple Vitamin (MULTIVITAMIN WITH MINERALS) TABS tablet Take 1 tablet by mouth daily.     NARCAN 4 MG/0.1ML LIQD nasal spray kit Place 1 spray into the nose as directed.    Norgestimate-Ethinyl Estradiol Triphasic (TRI-SPRINTEC) 0.18/0.215/0.25 MG-35 MCG tablet Take 1 tablet by mouth daily.    ondansetron (ZOFRAN ODT) 4 MG disintegrating tablet 27m ODT q4 hours prn nausea/vomit    ondansetron (ZOFRAN) 4 MG tablet Take 4 mg by mouth as needed for nausea.     OneTouch Delica Lancets 316PMISC Use as directed to check 1 time per day dx: e11.65    ONETOUCH VERIO test strip Use as directed to check 1 time per day dx: e11.65    PAIN MANAGEMENT INTRATHECAL, IT, PUMP 1 each by Intrathecal route continuous. Intrathecal (IT) medication: fentanyl, clonidine, bupivicaine  Fentanyl 2523mbupivicaine150mg/clonidine2mg  51.9772mer day, 2mg82mr day, 1.44mc50m.4ml/q77m    polyethylene glycol powder (GLYCOLAX/MIRALAX) 17 GM/SCOOP powder MIX 17 GRAMS IN 8 OUNCES OF LIQUID AND DRINK DAILY FOR BOWELS    promethazine (PHENERGAN) 25 MG tablet Take 1 tablet (25 mg total) by mouth every 6 (six) hours as needed for nausea or vomiting.    QUEtiapine (SEROQUEL XR) 400 MG 24 hr tablet Take 400 mg by mouth 2 (two) times daily.    QUEtiapine (SEROQUEL) 300 MG tablet Take 300 mg by mouth at bedtime.    sitaGLIPtin-metformin (JANUMET) 50-1000 MG tablet Take 1 tablet by mouth 2 (two) times daily with a meal.    tiZANidine (ZANAFLEX) 4 MG tablet Take 4 mg by mouth every 4 (four) hours.    traZODone (DESYREL) 100 MG tablet 5 qhs prescribed by psychiatry (Patient taking differently: Take 100-200 mg by mouth at bedtime. 5 qhs prescribed by psychiatry)    triamcinolone cream (KENALOG) 0.1 % Apply 1 application topically daily as needed (for itching).    TRINTELLIX 20 MG TABS tablet Take 20 mg by mouth daily.    VRAYLAR 6 MG CAPS Take 6 mg by mouth daily. 06/21/2020: D/C by psychiatrist  Lida PShelbie Hutchingatient    No facility-administered encounter medications on file as of 06/28/2021.    Patient Active Problem List   Diagnosis Date Noted   Pain in right knee 11/17/2019   Pain in left knee 05/06/2019   Hashimoto's disease 04/05/2019   Nausea 11/23/2018   Frequent falls 09/17/2018   Skin lesion 09/17/2018   GERD (gastroesophageal reflux disease)    Chronic midline low back pain with sciatica 05/27/2018   Intractable pain 03/20/2018   Bradycardia    Chronic pain syndrome 03/18/2018   Presence of  intrathecal pump 03/18/2018   Hypokalemia 03/18/2018   Intractable back pain 01/03/2018   HTN (hypertension) 01/03/2018   Type 2 diabetes mellitus without complication ([place]) 78/29/5621   Vitamin D deficiency 07/11/2017   Postmenopausal 04/24/2017   Microalbuminuria 01/14/2017   Dyspareunia in female 12/10/2016   Endometriosis determined by laparoscopy 12/10/2016   History of exploratory laparotomy 12/10/2016   Pelvic pain 12/10/2016   Bipolar 1 disorder ([place]) 09/19/2016   Cyst of right ovary 09/19/2016   Severe episode of recurrent major depressive disorder, with psychotic features ([place]) 09/19/2016   Schizoaffective disorder, bipolar type ([place]) 09/19/2016   Chronic pain 03/05/2016   Non morbid obesity due to excess calories 03/05/2016   S/P lumbar fusion 03/05/2016    Conditions to be addressed/monitored: HTN, DMII, and Hashimoto's Disease ; Transportation  Care Plan : Social Work Plan of Care  Updates made by Daneen Schick since 06/28/2021 12:00 AM     Problem: Mobility and Independence      Goal: Identify Transportation Resources   Start Date: 06/28/2021  Priority: High  Note:   Current Barriers:  Chronic disease management support and education needs related to  DM II, HTN, Hashimoto's Disease   Systems analyst Clinical Goal(s):  patient will work with SW to identify and address any acute and/or chronic care coordination needs related to  the self health management of HTN, DM, and Hashimoto's Disease   explore community resource options for unmet needs related to:  Transportation SW Interventions:  Inter-disciplinary care team collaboration (see longitudinal plan of care) Collaboration with [name], FNP regarding development and update of comprehensive plan of care as evidenced by provider attestation and co-signature Collaboration with RN Care Manager who advises patient requests SW intervention to assist with transportation to and from medical appointments Discussed SW has attempted to assist patient with transportation in the past but was unable to keep patient engaged Mailed patient information on [place]) which patient is approved to access Scheduled follow up call to the patient over the next 10 days  Patient Goals/Self-Care Activities patient will:   -  Review mailed resource information -Engage with SW to address transportation resource needs  Follow Up Plan: Telephone follow up appointment with care management team member scheduled for:12.15.22       Follow Up Plan: SW will follow up with patient by phone over the next 10 days.      Daneen Schick, BSW, CDP Social Worker, Certified Dementia Practitioner [place] [facility] [place] [facility] [phone]

## 2021-06-29 NOTE — Patient Instructions (Addendum)
Visit Information   Thank you for taking time to visit with me today. Please don't hesitate to contact me if I can be of assistance to you before our next scheduled telephone appointment.  Following are the goals we discussed today:  (Copy and paste patient goals from clinical care plan here)  Our next appointment is by telephone on 08/22/21 at 12:40 PM   Please call the care guide team at [phone] if you need to cancel or reschedule your appointment.   If you are experiencing a Mental Health or [place] or need someone to talk to, please call 1-800-273-TALK (toll free, 24 hour hotline)   Following is a copy of your full care plan:  Care Plan : [place] of Care  Updates made by Lynne Cozzolino, RN since 06/28/2021 12:00 AM     Problem: No plan established for care established for management of chronic disease states (Type 2 diabetes Mellitus, Essential hypertension, Chronic pain syndrome, Hashimoto's disease, Lumbar radiculopathy, GERD)   Priority: High     Long-Range Goal: Development of plan of care for chronic disease management for (Type 2 diabetes Mellitus, Essential hypertension, Chronic pain syndrome, Hashimoto's disease, Lumbar radiculopathy, GERD)   Start Date: 06/28/2021  Expected End Date: 06/28/2022  This Visit's Progress: On track  Priority: High  Note:   Current Barriers:  Knowledge Deficits related to plan of care for management of Type 2 diabetes Mellitus, Essential hypertension, Chronic pain syndrome, Hashimoto's disease, Lumbar radiculopathy, GERD   Chronic Disease Management support and education needs related to Type 2 diabetes Mellitus, Essential hypertension, Chronic pain syndrome, Hashimoto's disease, Lumbar radiculopathy, GERD   Lacks caregiver support Transportation barriers  RNCM Clinical Goal(s):  Patient will verbalize basic understanding of  Type 2 diabetes Mellitus, Essential hypertension, Chronic pain syndrome, Hashimoto's  disease, Lumbar radiculopathy, GERD  disease process and self health management plan as evidenced by patient will report having no disease exacerbations related to her chronic disease states listed above take all medications exactly as prescribed and will call provider for medication related questions as evidenced by patient will report having no missed doses of her prescribed medications  demonstrate Improved health management independence as evidenced by patient will report 100% adherence to following her prescribed treatment plan continue to work with RN Care Manager to address care management and care coordination needs related to  Type 2 diabetes Mellitus, Essential hypertension, Chronic pain syndrome, Hashimoto's disease, Lumbar radiculopathy, GERD  as evidenced by adherence to CM Team Scheduled appointments through collaboration with RN Care manager, provider, and care team.   Interventions: 1:1 collaboration with primary care provider regarding development and update of comprehensive plan of care as evidenced by provider attestation and co-signature Inter-disciplinary care team collaboration (see longitudinal plan of care) Evaluation of current treatment plan related to  self management and patient's adherence to plan as established by provider  Diabetes Interventions:  (Status:  Goal on track:  Yes.) Long Term Goal Assessed patient's understanding of A1c goal:  <5.7 Reviewed medications with patient and discussed importance of medication adherence Counseled on importance of regular laboratory monitoring as prescribed Provided patient with written educational materials related to hypo and hyperglycemia and importance of correct treatment Review of patient status, including review of consultants reports, relevant laboratory and other test results, and medications completed Discussed plans with patient for ongoing care management follow up and provided patient with direct contact information  for care management team Lab Results  Component Value Date  HGBA1C 5.9 (H) 02/07/2021   Pain Interventions:  (Status:  Goal on track:  Yes.) Long Term Goal Pain assessment performed Medications reviewed Reviewed provider established plan for pain management Discussed importance of adherence to all scheduled medical appointments Counseled on the importance of reporting any/all new or changed pain symptoms or management strategies to pain management provider Advised patient to report to care team affect of pain on daily activities Discussed use of relaxation techniques and/or diversional activities to assist with pain reduction (distraction, imagery, relaxation, massage, acupressure, TENS, heat, and cold application Reviewed with patient prescribed pharmacological and nonpharmacological pain relief strategies Screening for signs and symptoms of 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  Assessed social determinant of health barriers Discussed plans with patient for ongoing care management follow up and provided patient with direct contact information for care management team   Falls Interventions:  (Status:  New goal.) Long Term Goal Reviewed medications and discussed potential side effects of medications such as dizziness and frequent urination Advised patient of importance of notifying provider of falls Assessed for signs and symptoms of orthostatic hypotension Assessed for falls since last encounter Assessed patients knowledge of fall risk prevention secondary to previously provided education Assessed working status of life alert bracelet and patient adherence Notified PCP of recent fall and patient reports bumping her head, she refuses to go to the ED and reports no change in her status   Reviewed scheduled/upcoming provider appointments including: next PCP follow up appointment scheduled for 08/20/21 @4 :00 PM  Determined patient continues to need assistance with transportation, sent  in basket message to [place] requesting assistance  Patient Goals/Self-Care Activities: Take all medications as prescribed Attend all scheduled provider appointments Call pharmacy for medication refills 3-7 days in advance of running out of medications Call provider office for new concerns or questions  drink 6 to 8 glasses of water each day fill half of plate with vegetables manage portion size  Follow Up Plan:  Telephone follow up appointment with care management team member scheduled for:  08/22/21      Consent to CCM Services: Linda Mercado was given information about Chronic Care Management services including:  CCM service includes personalized support from designated clinical staff supervised by her physician, including individualized plan of care and coordination with other care providers 24/7 contact phone numbers for assistance for urgent and routine care needs. Service will only be billed when office clinical staff spend 20 minutes or more in a month to coordinate care. Only one practitioner may furnish and bill the service in a calendar month. The patient may stop CCM services at any time (effective at the end of the month) by phone call to the office staff. The patient will be responsible for cost sharing (co-pay) of up to 20% of the service fee (after annual deductible is met).  Patient agreed to services and verbal consent obtained.   Patient verbalizes understanding of instructions provided today and agrees to view in [place].   Telephone follow up appointment with care management team member scheduled for: 08/22/21

## 2021-06-29 NOTE — Chronic Care Management (AMB) (Signed)
Chronic Care Management   CCM RN Visit Note  06/28/2021 Name: Linda Mercado MRN: [id number] DOB: 09-25-1965  Subjective: Linda Mercado is a 55 y.o. year old female who is a primary care patient of [name], [place]. The care management team was consulted for assistance with disease management and care coordination needs.    Engaged with patient by telephone for follow up visit in response to provider referral for case management and/or care coordination services.   Consent to Services:  The patient was given information about Chronic Care Management services, agreed to services, and gave verbal consent prior to initiation of services.  Please see initial visit note for detailed documentation.   Patient agreed to services and verbal consent obtained.   Assessment: Review of patient past medical history, allergies, medications, health status, including review of consultants reports, laboratory and other test data, was performed as part of comprehensive evaluation and provision of chronic care management services.   SDOH (Social Determinants of Health) assessments and interventions performed:  Yes, SW referral sent   CCM Care Plan  Allergies  Allergen Reactions   Lisinopril Hives    Other reaction(s): anaphylaxis/angioedema   Latex Hives   Morphine And Related     Itching, skin get hot.    Adhesive [Tape] Rash    Outpatient Encounter Medications as of 06/28/2021  Medication Sig Note   albuterol (VENTOLIN HFA) 108 (90 Base) MCG/ACT inhaler Inhale 2 puffs into the lungs every 6 (six) hours as needed for wheezing or shortness of breath.    Alcohol Swabs PADS Use as directed to check blood sugars    AMITIZA 24 MCG capsule Take 24 mcg by mouth 2 (two) times daily.     atenolol (TENORMIN) 100 MG tablet TAKE 1 TABLET(100 MG) BY MOUTH DAILY    atorvastatin (LIPITOR) 10 MG tablet TAKE 1 TABLET(10 MG) BY MOUTH AT BEDTIME    BD PEN NEEDLE NANO 2ND GEN 32G X 4 MM MISC USE AS DIRECTED WITH  TRESIBA DAILY    benzonatate (TESSALON PERLES) 100 MG capsule Take 1 capsule (100 mg total) by mouth every 6 (six) hours as needed.    Blood Glucose Monitoring Suppl (ONETOUCH VERIO) w/Device KIT Use as directed to check 1 time per day dx: e11.65    calcium gluconate 500 MG tablet Take 1 tablet (500 mg total) by mouth 3 (three) times daily.    CAPLYTA 42 MG CAPS Take 42 mg by mouth at bedtime.     clonazePAM (KLONOPIN) 1 MG tablet Take 1 mg by mouth 3 (three) times daily.    Cyanocobalamin (B-12) 500 MCG SUBL One SL QD    dexlansoprazole (DEXILANT) 60 MG capsule Take 1 capsule (60 mg total) by mouth daily. Patient will need appt for further refills    diclofenac Sodium (VOLTAREN) 1 % GEL APPLY 4 GRAMS TO THE AFFECTED AREA FOUR TIMES DAILY    docusate sodium (COLACE) 50 MG capsule Take 50 mg by mouth daily.     FLOVENT HFA 110 MCG/ACT inhaler INHALE 2 PUFFS INTO THE LUNGS EVERY 12 HOURS    Glucagon (GVOKE HYPOPEN 2-PACK) 0.5 MG/0.1ML SOAJ Inject 1 each into the skin as needed.    glucose blood (ACCU-CHEK GUIDE) test strip USE TO TEST TWICE DAILY AS DIRECTED    hydrochlorothiazide (HYDRODIURIL) 12.5 MG tablet TAKE 1 TABLET(12.5 MG) BY MOUTH DAILY    HYDROmorphone (DILAUDID) 4 MG tablet Take 4 mg by mouth 4 (four) times daily as needed for severe  pain.    hydrOXYzine (ATARAX/VISTARIL) 25 MG tablet Take 25 mg by mouth in the morning, at noon, in the evening, and at bedtime.    lidocaine (XYLOCAINE) 2 % solution Use as directed 15 mLs in the mouth or throat 3 (three) times a week.     Lidocaine 4 % PTCH Place 1 patch onto the skin in the morning and at bedtime.    LORazepam (ATIVAN) 2 MG tablet Take 2 mg by mouth 4 (four) times daily as needed for anxiety.     losartan (COZAAR) 25 MG tablet Take 1 tablet (25 mg total) by mouth daily.    meclizine (ANTIVERT) 12.5 MG tablet TAKE 1 TABLET BY MOUTH 3 TIMES DAILY AS NEEDED FOR DIZZINESS.    Multiple Vitamin (MULTIVITAMIN WITH MINERALS) TABS tablet Take 1  tablet by mouth daily.     NARCAN 4 MG/0.1ML LIQD nasal spray kit Place 1 spray into the nose as directed.    Norgestimate-Ethinyl Estradiol Triphasic (TRI-SPRINTEC) 0.18/0.215/0.25 MG-35 MCG tablet Take 1 tablet by mouth daily.    ondansetron (ZOFRAN ODT) 4 MG disintegrating tablet 26m ODT q4 hours prn nausea/vomit    ondansetron (ZOFRAN) 4 MG tablet Take 4 mg by mouth as needed for nausea.     OneTouch Delica Lancets 323FMISC Use as directed to check 1 time per day dx: e11.65    ONETOUCH VERIO test strip Use as directed to check 1 time per day dx: e11.65    PAIN MANAGEMENT INTRATHECAL, IT, PUMP 1 each by Intrathecal route continuous. Intrathecal (IT) medication: fentanyl, clonidine, bupivicaine  Fentanyl 2547mbupivicaine150mg/clonidine2mg  51.9729mer day, 2mg63mr day, 1.44mc26m.4ml/q33m    polyethylene glycol powder (GLYCOLAX/MIRALAX) 17 GM/SCOOP powder MIX 17 GRAMS IN 8 OUNCES OF LIQUID AND DRINK DAILY FOR BOWELS    promethazine (PHENERGAN) 25 MG tablet Take 1 tablet (25 mg total) by mouth every 6 (six) hours as needed for nausea or vomiting.    QUEtiapine (SEROQUEL XR) 400 MG 24 hr tablet Take 400 mg by mouth 2 (two) times daily.    QUEtiapine (SEROQUEL) 300 MG tablet Take 300 mg by mouth at bedtime.    sitaGLIPtin-metformin (JANUMET) 50-1000 MG tablet Take 1 tablet by mouth 2 (two) times daily with a meal.    tiZANidine (ZANAFLEX) 4 MG tablet Take 4 mg by mouth every 4 (four) hours.    traZODone (DESYREL) 100 MG tablet 5 qhs prescribed by psychiatry (Patient taking differently: Take 100-200 mg by mouth at bedtime. 5 qhs prescribed by psychiatry)    triamcinolone cream (KENALOG) 0.1 % Apply 1 application topically daily as needed (for itching).    TRINTELLIX 20 MG TABS tablet Take 20 mg by mouth daily.    VRAYLAR 6 MG CAPS Take 6 mg by mouth daily. 06/21/2020: D/C by psychiatrist Lida PShelbie Hutchingatient    No facility-administered encounter medications on file as of 06/28/2021.     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   Hypokalemia 03/18/2018   Intractable back pain 01/03/2018   HTN (hypertension) 01/03/2018   Type 2  diabetes mellitus without complication ([employer]) 09/32/3557   Vitamin D deficiency 07/11/2017   Postmenopausal 04/24/2017   Microalbuminuria 01/14/2017   Dyspareunia in female 12/10/2016   Endometriosis determined by laparoscopy 12/10/2016   History of exploratory laparotomy 12/10/2016   Pelvic pain 12/10/2016   Bipolar 1 disorder ([place]) 09/19/2016   Cyst of right ovary 09/19/2016   Severe episode of recurrent major depressive disorder, with psychotic features ([place]) 09/19/2016   Schizoaffective disorder, bipolar type ([place]) 09/19/2016   Chronic pain 03/05/2016   Non morbid obesity due to excess calories 03/05/2016   S/P lumbar fusion 03/05/2016    Conditions to be addressed/monitored: Type 2 diabetes Mellitus, Essential hypertension, Chronic pain syndrome, Hashimoto's disease, Lumbar radiculopathy, GERD   Care Plan : RN Care Manager Plan of Care  Updates made by Lynne Burgener, RN since 06/28/2021 12:00 AM     Problem: No plan established for care established for management of chronic disease states (Type 2 diabetes Mellitus, Essential hypertension, Chronic pain syndrome, Hashimoto's disease, Lumbar radiculopathy, GERD)   Priority: High     Long-Range Goal: Development of plan of care for chronic disease management for (Type 2 diabetes Mellitus, Essential hypertension, Chronic pain syndrome, Hashimoto's disease, Lumbar radiculopathy, GERD)    Start Date: 06/28/2021  Expected End Date: 06/28/2022  This Visit's Progress: On track  Priority: High  Note:   Current Barriers:  Knowledge Deficits related to plan of care for management of Type 2 diabetes Mellitus, Essential hypertension, Chronic pain syndrome, Hashimoto's disease, Lumbar radiculopathy, GERD   Chronic Disease Management support and education needs related to Type 2 diabetes Mellitus, Essential hypertension, Chronic pain syndrome, Hashimoto's disease, Lumbar radiculopathy, GERD   Lacks caregiver support Transportation barriers  RNCM Clinical Goal(s):  Patient will verbalize basic understanding of  Type 2 diabetes Mellitus, Essential hypertension, Chronic pain syndrome, Hashimoto's disease, Lumbar radiculopathy, GERD  disease process and self health management plan as evidenced by patient will report having no disease exacerbations related to her chronic disease states listed above take all medications exactly as prescribed and will call provider for medication related questions as evidenced by patient will report having no missed doses of her prescribed medications  demonstrate Improved health management independence as evidenced by patient will report 100% adherence to following her prescribed treatment plan continue to work with RN Care Manager to address care management and care coordination needs related to  Type 2 diabetes Mellitus, Essential hypertension, Chronic pain syndrome, Hashimoto's disease, Lumbar radiculopathy, GERD  as evidenced by adherence to CM Team Scheduled appointments through collaboration with RN Care manager, provider, and care team.   Interventions: 1:1 collaboration with primary care provider regarding development and update of comprehensive plan of care as evidenced by provider attestation and co-signature Inter-disciplinary care team collaboration (see longitudinal plan of care) Evaluation of current treatment plan related to  self management and  patient's adherence to plan as established by provider  Diabetes Interventions:  (Status:  Goal on track:  Yes.) Long Term Goal Assessed patient's understanding of A1c goal:  <5.7 Reviewed medications with patient and discussed importance of medication adherence Counseled on importance of regular laboratory monitoring as prescribed Provided patient with written educational materials related to hypo and hyperglycemia and importance of correct treatment Review of patient status, including review of consultants reports, relevant laboratory and other test results, and medications completed Discussed plans with patient for ongoing care management follow up and provided patient with direct contact information for care management team Lab Results  Component  Value Date   HGBA1C 5.9 (H) 02/07/2021   Pain Interventions:  (Status:  Goal on track:  Yes.) Long Term Goal Pain assessment performed Medications reviewed Reviewed provider established plan for pain management Discussed importance of adherence to all scheduled medical appointments Counseled on the importance of reporting any/all new or changed pain symptoms or management strategies to pain management provider Advised patient to report to care team affect of pain on daily activities Discussed use of relaxation techniques and/or diversional activities to assist with pain reduction (distraction, imagery, relaxation, massage, acupressure, TENS, heat, and cold application Reviewed with patient prescribed pharmacological and nonpharmacological pain relief strategies Screening for signs and symptoms of 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  Assessed social determinant of health barriers Discussed plans with patient for ongoing care management follow up and provided patient with direct contact information for care management team   Falls Interventions:  (Status:  New goal.) Long Term Goal Reviewed medications and discussed potential side  effects of medications such as dizziness and frequent urination Advised patient of importance of notifying provider of falls Assessed for signs and symptoms of orthostatic hypotension Assessed for falls since last encounter Assessed patients knowledge of fall risk prevention secondary to previously provided education Assessed working status of life alert bracelet and patient adherence Notified PCP of recent fall and patient reports bumping her head, she refuses to go to the ED and reports no change in her status   Reviewed scheduled/upcoming provider appointments including: next PCP follow up appointment scheduled for 08/20/21 @4 :00 PM  Determined patient continues to need assistance with transportation, sent in basket message to [place] requesting assistance  Patient Goals/Self-Care Activities: Take all medications as prescribed Attend all scheduled provider appointments Call pharmacy for medication refills 3-7 days in advance of running out of medications Call provider office for new concerns or questions  drink 6 to 8 glasses of water each day fill half of plate with vegetables manage portion size  Follow Up Plan:  Telephone follow up appointment with care management team member scheduled for:  08/22/21      Plan:Telephone follow up appointment with care management team member scheduled for:  08/22/21   Barb Merino, RN, BSN, CCM Care Management Coordinator [facility]  Direct Phone: [phone]

## 2021-07-05 ENCOUNTER — Telehealth: Payer: MEDICARE

## 2021-07-05 ENCOUNTER — Telehealth: Payer: Self-pay

## 2021-07-05 NOTE — Telephone Encounter (Signed)
°  Care Management   Follow Up Note   07/05/2021 Name: Linda Mercado MRN: [id number] DOB: 04/24/1966   Referred by: [name], FNP Reason for referral : Chronic Care Management (Unsuccessful call)   SW placed an unsuccessful outbound call to the patient in an attempt to assist with transportation needs as requested by patients RN Care Manager. SW left a HIPAA compliant voice message requesting a return call. SW has had multiple failed call attempts to assist the patient with resource needs with no return call received. SW will attempt one final call to the patient over the next 21 days.  Daneen Schick, BSW, CDP Social Worker, Certified Dementia Practitioner [place] [facility] [phone]

## 2021-07-12 ENCOUNTER — Other Ambulatory Visit: Payer: Self-pay | Admitting: Nurse Practitioner

## 2021-07-12 DIAGNOSIS — K219 Gastro-esophageal reflux disease without esophagitis: Secondary | ICD-10-CM

## 2021-07-25 ENCOUNTER — Telehealth: Payer: Self-pay

## 2021-07-25 ENCOUNTER — Telehealth: Payer: MEDICARE

## 2021-07-25 NOTE — Telephone Encounter (Signed)
°  Care Management   Follow Up Note   07/25/2021 Name: Linda Mercado MRN: [id number] DOB: [birth year]   Referred by: [name], FNP Reason for referral : Chronic Care Management (Unsuccessful call)   Fifth and final unsuccessful outbound call placed to the patient to assist with transportation and caregiver assistance needs. SW left a HIPAA compliant voice message requesting a return call.  Follow Up Plan:  No SW follow up planned at this time. SW has left the patient 5 voice messages requesting return calls over the past several weeks with no return calls received. The patient will remain engaged with RN Care Manager.  Daneen Schick, BSW, CDP Social Worker, Certified Dementia Practitioner [place] [facility] [place] [facility] [phone]

## 2021-08-05 ENCOUNTER — Emergency Department (HOSPITAL_COMMUNITY)
Admission: EM | Admit: 2021-08-05 | Discharge: 2021-08-06 | Discharge status: Against Medical Advice | Payer: Medicare Other | Attending: Emergency Medicine | Admitting: Emergency Medicine

## 2021-08-05 ENCOUNTER — Other Ambulatory Visit: Payer: Self-pay

## 2021-08-05 ENCOUNTER — Encounter (HOSPITAL_COMMUNITY): Payer: Self-pay | Admitting: Emergency Medicine

## 2021-08-05 DIAGNOSIS — Y92009 Unspecified place in unspecified non-institutional (private) residence as the place of occurrence of the external cause: Secondary | ICD-10-CM | POA: Insufficient documentation

## 2021-08-05 DIAGNOSIS — M545 Low back pain, unspecified: Secondary | ICD-10-CM | POA: Diagnosis present

## 2021-08-05 DIAGNOSIS — Z5321 Procedure and treatment not carried out due to patient leaving prior to being seen by health care provider: Secondary | ICD-10-CM | POA: Insufficient documentation

## 2021-08-05 DIAGNOSIS — M79604 Pain in right leg: Secondary | ICD-10-CM | POA: Insufficient documentation

## 2021-08-05 DIAGNOSIS — M79605 Pain in left leg: Secondary | ICD-10-CM | POA: Diagnosis not present

## 2021-08-05 DIAGNOSIS — W19XXXA Unspecified fall, initial encounter: Secondary | ICD-10-CM | POA: Diagnosis not present

## 2021-08-05 DIAGNOSIS — G8929 Other chronic pain: Secondary | ICD-10-CM | POA: Diagnosis not present

## 2021-08-05 HISTORY — DX: Dorsalgia, unspecified: M54.9

## 2021-08-05 NOTE — ED Triage Notes (Signed)
Pt to triage via [facility] from home.  Chronic back pain from fall in 2009.  Fell 2 days in driveway next to car.  States she was opening car door and it was raining. C/o lower back pain and bilateral leg pain.  Pt lying in bed on EMS arrival and states she is unable to ambulate.

## 2021-08-05 NOTE — ED Notes (Signed)
Pt leaving ED, does not want to wait any longer.

## 2021-08-05 NOTE — ED Provider Triage Note (Signed)
Emergency Medicine Provider Triage Evaluation Note  Linda Mercado , a 56 y.o. female  was evaluated in triage.  She is in a recliner in the hallway making exam difficult.  Pt complains of lower back and bilateral leg pain.  She has a history of chronic pain with a pain pump. She reports falling 2 days ago at home. Transported by EMS today.  States that she has pain from the base of her neck down her entire spine and into her legs.  Was unable to ambulate for EMS.  Review of Systems  Positive: Back pain Negative: Headache  Physical Exam  BP 136/79 (BP Location: Right Arm)    Pulse (!) 55    Temp 98.4 F (36.9 C) (Oral)    Resp 18    SpO2 94%  Gen:   Awake, patient tearful and whimpering Resp:  Normal effort  MSK:   Moves extremities without difficulty  Other:    Medical Decision Making  Medically screening exam initiated at 4:45 PM.  Appropriate orders placed.  Linda Mercado was informed that the remainder of the evaluation will be completed by another provider, this initial triage assessment does not replace that evaluation, and the importance of remaining in the ED until their evaluation is complete.  Will need exam of back when she is able to be moved to an exam room.   [place] Cater, PA-C 08/05/21 1719

## 2021-08-14 ENCOUNTER — Telehealth: Payer: Self-pay

## 2021-08-14 NOTE — Chronic Care Management (AMB) (Signed)
Chronic Care Management Pharmacy Assistant   Name: Linda Mercado  MRN: [id number] DOB: 12-Feb-1966  Reason for Encounter: Disease State/ Hypertension  Recent office visits:  06-28-2021 Daneen Schick (CCM)  06-28-2021 Little, Claudette Stapler, RN (CCM)  06-20-2021  Daneen Schick (CCM)  06-06-2021 Daneen Schick (CCM)  06-01-2021 Daneen Schick (CCM)  05-18-2021 Daneen Schick (CCM)  05-07-2021 Daneen Schick (CCM)  04-17-2021 Daneen Schick (CCM)  Recent consult visits:  08-08-2021 Lucia Bitter, MD (Pain medicine). Visit for IT pump refill.  06-04-2021 Priddy Dondra Prader, ANP (Pain medicine). Visit for IT pump refill.  05-02-2021 Lucia Bitter, MD (Pain medicine). Visit for IT pump refill.  Hospital visits:  Medication Reconciliation was completed by comparing discharge summary, patients EMR and Pharmacy list, and upon discussion with patient.  Admitted to the hospital on 08-05-2021 due to back pain. Discharge date was 08-06-2021 Discharged from [place]?Medications Started at [facility] Discharge:?? None  Medication Changes at Hospital Discharge: None  Medications Discontinued at Hospital Discharge: None  Medications that remain the same after Hospital Discharge:??  -All other medications will remain the same.    Hospital visits:  Medication Reconciliation was completed by comparing discharge summary, patients EMR and Pharmacy list, and upon discussion with patient.   Admitted to the hospital on 04-30-2021 due to Abdominal pain.  Discharge date was 04-30-2021. Discharged from [place]?Medications Started at [facility] Discharge:?? Zofran 4 mg every 4 hours as needed   Medication Changes at Hospital Discharge: None   Medications Discontinued at Hospital Discharge: None   Medications that remain the same after Hospital Discharge:??  -All other medications will remain the same.     Medications: Outpatient  Encounter Medications as of 08/14/2021  Medication Sig Note   albuterol (VENTOLIN HFA) 108 (90 Base) MCG/ACT inhaler Inhale 2 puffs into the lungs every 6 (six) hours as needed for wheezing or shortness of breath.    Alcohol Swabs PADS Use as directed to check blood sugars    AMITIZA 24 MCG capsule Take 24 mcg by mouth 2 (two) times daily.     atenolol (TENORMIN) 100 MG tablet TAKE 1 TABLET(100 MG) BY MOUTH DAILY    atorvastatin (LIPITOR) 10 MG tablet TAKE 1 TABLET(10 MG) BY MOUTH AT BEDTIME    BD PEN NEEDLE NANO 2ND GEN 32G X 4 MM MISC USE AS DIRECTED WITH TRESIBA DAILY    Blood Glucose Monitoring Suppl (ONETOUCH VERIO) w/Device KIT Use as directed to check 1 time per day dx: e11.65    calcium gluconate 500 MG tablet Take 1 tablet (500 mg total) by mouth 3 (three) times daily.    CAPLYTA 42 MG CAPS Take 42 mg by mouth at bedtime.     clonazePAM (KLONOPIN) 1 MG tablet Take 1 mg by mouth 3 (three) times daily.    Cyanocobalamin (B-12) 500 MCG SUBL One SL QD    dexlansoprazole (DEXILANT) 60 MG capsule Take 1 capsule (60 mg total) by mouth daily. Patient will need appt for further refills    diclofenac Sodium (VOLTAREN) 1 % GEL APPLY 4 GRAMS TO THE AFFECTED AREA FOUR TIMES DAILY    docusate sodium (COLACE) 50 MG capsule Take 50 mg by mouth daily.     FLOVENT HFA 110 MCG/ACT inhaler INHALE 2 PUFFS INTO THE LUNGS EVERY 12 HOURS    Glucagon (GVOKE HYPOPEN 2-PACK) 0.5 MG/0.1ML SOAJ Inject 1 each into the skin as needed.  glucose blood (ACCU-CHEK GUIDE) test strip USE TO TEST TWICE DAILY AS DIRECTED    hydrochlorothiazide (HYDRODIURIL) 12.5 MG tablet TAKE 1 TABLET(12.5 MG) BY MOUTH DAILY    HYDROmorphone (DILAUDID) 4 MG tablet Take 4 mg by mouth 4 (four) times daily as needed for severe pain.    hydrOXYzine (ATARAX/VISTARIL) 25 MG tablet Take 25 mg by mouth in the morning, at noon, in the evening, and at bedtime.    lidocaine (XYLOCAINE) 2 % solution Use as directed 15 mLs in the mouth or throat 3  (three) times a week.     Lidocaine 4 % PTCH Place 1 patch onto the skin in the morning and at bedtime.    LORazepam (ATIVAN) 2 MG tablet Take 2 mg by mouth 4 (four) times daily as needed for anxiety.     losartan (COZAAR) 25 MG tablet Take 1 tablet (25 mg total) by mouth daily.    meclizine (ANTIVERT) 12.5 MG tablet TAKE 1 TABLET BY MOUTH 3 TIMES DAILY AS NEEDED FOR DIZZINESS.    Multiple Vitamin (MULTIVITAMIN WITH MINERALS) TABS tablet Take 1 tablet by mouth daily.     NARCAN 4 MG/0.1ML LIQD nasal spray kit Place 1 spray into the nose as directed.    Norgestimate-Ethinyl Estradiol Triphasic (TRI-SPRINTEC) 0.18/0.215/0.25 MG-35 MCG tablet Take 1 tablet by mouth daily.    ondansetron (ZOFRAN ODT) 4 MG disintegrating tablet 70m ODT q4 hours prn nausea/vomit    ondansetron (ZOFRAN) 4 MG tablet Take 4 mg by mouth as needed for nausea.     OneTouch Delica Lancets 326OMISC Use as directed to check 1 time per day dx: e11.65    ONETOUCH VERIO test strip Use as directed to check 1 time per day dx: e11.65    PAIN MANAGEMENT INTRATHECAL, IT, PUMP 1 each by Intrathecal route continuous. Intrathecal (IT) medication: fentanyl, clonidine, bupivicaine  Fentanyl 2584mbupivicaine150mg/clonidine2mg  51.9768mer day, 2mg63mr day, 1.44mc56m.4ml/q50m    polyethylene glycol powder (GLYCOLAX/MIRALAX) 17 GM/SCOOP powder MIX 17 GRAMS IN 8 OUNCES OF LIQUID AND DRINK DAILY FOR BOWELS    promethazine (PHENERGAN) 25 MG tablet Take 1 tablet (25 mg total) by mouth every 6 (six) hours as needed for nausea or vomiting.    QUEtiapine (SEROQUEL XR) 400 MG 24 hr tablet Take 400 mg by mouth 2 (two) times daily.    QUEtiapine (SEROQUEL) 300 MG tablet Take 300 mg by mouth at bedtime.    sitaGLIPtin-metformin (JANUMET) 50-1000 MG tablet Take 1 tablet by mouth 2 (two) times daily with a meal.    tiZANidine (ZANAFLEX) 4 MG tablet Take 4 mg by mouth every 4 (four) hours.    traZODone (DESYREL) 100 MG tablet 5 qhs prescribed by  psychiatry (Patient taking differently: Take 100-200 mg by mouth at bedtime. 5 qhs prescribed by psychiatry)    triamcinolone cream (KENALOG) 0.1 % Apply 1 application topically daily as needed (for itching).    TRINTELLIX 20 MG TABS tablet Take 20 mg by mouth daily.    VRAYLAR 6 MG CAPS Take 6 mg by mouth daily. 06/21/2020: D/C by psychiatrist Lida PShelbie Hutchingatient    No facility-administered encounter medications on file as of 08/14/2021.  Reviewed chart prior to disease state call.   Recent Office Vitals: BP Readings from Last 3 Encounters:  08/05/21 (!) 177/90  04/30/21 (!) 169/74  02/07/21 128/74   Pulse Readings from Last 3 Encounters:  08/05/21 76  04/30/21 65  02/07/21 79    Wt Readings from Last  3 Encounters:  02/07/21 148 lb 9.6 oz (67.4 kg)  10/25/20 155 lb 6.4 oz (70.5 kg)  09/13/20 164 lb (74.4 kg)     Kidney Function Lab Results  Component Value Date/Time   CREATININE 1.08 (H) 04/30/2021 04:00 PM   CREATININE 0.85 02/07/2021 11:36 AM   GFRNONAA >60 04/30/2021 04:00 PM   GFRAA 60 08/24/2020 03:51 PM    BMP Latest Ref Rng & Units 04/30/2021 02/07/2021 10/25/2020  Glucose 70 - 99 mg/dL 84 111(H) 68  BUN 6 - 20 mg/dL 13 9 11   Creatinine 0.44 - 1.00 mg/dL 1.08(H) 0.85 0.95  BUN/Creat Ratio 9 - 23 - 11 12  Sodium 135 - 145 mmol/L 137 140 143  Potassium 3.5 - 5.1 mmol/L 3.4(L) 3.8 3.4(L)  Chloride 98 - 111 mmol/L 103 99 99  CO2 22 - 32 mmol/L 24 22 22   Calcium 8.9 - 10.3 mg/dL 9.2 10.0 9.5    08-14-2021: 1st attempt left VM 08-15-2021: 2nd attempt left VM 08-16-2021: 3rd attempt left VM  Care Gaps: Shingrix overdue Flu vaccine overdue Covid booster overdue  6 month A1C overdue AWV 09-19-2021  Star Rating Drugs: Losartan 50 mg- Last filled 07-18-2021 [facility] Atorvastatin 10 mg- Last filled 07-21-2021 [facility] Janumet 50-1000 mg- Last filled 05-22-2021 [facility]  [place] Pharmacist Assistant [phone]

## 2021-08-20 ENCOUNTER — Other Ambulatory Visit: Payer: Self-pay | Admitting: Nurse Practitioner

## 2021-08-20 ENCOUNTER — Ambulatory Visit: Payer: MEDICARE | Admitting: Nurse Practitioner

## 2021-08-20 DIAGNOSIS — I10 Essential (primary) hypertension: Secondary | ICD-10-CM

## 2021-08-20 DIAGNOSIS — E119 Type 2 diabetes mellitus without complications: Secondary | ICD-10-CM

## 2021-08-21 ENCOUNTER — Other Ambulatory Visit: Payer: Self-pay | Admitting: Nurse Practitioner

## 2021-08-22 ENCOUNTER — Telehealth: Payer: MEDICARE

## 2021-08-22 ENCOUNTER — Ambulatory Visit (INDEPENDENT_AMBULATORY_CARE_PROVIDER_SITE_OTHER): Payer: Medicaid Other

## 2021-08-22 DIAGNOSIS — E063 Autoimmune thyroiditis: Secondary | ICD-10-CM

## 2021-08-22 DIAGNOSIS — I10 Essential (primary) hypertension: Secondary | ICD-10-CM

## 2021-08-22 DIAGNOSIS — M5416 Radiculopathy, lumbar region: Secondary | ICD-10-CM

## 2021-08-22 DIAGNOSIS — G894 Chronic pain syndrome: Secondary | ICD-10-CM

## 2021-08-22 DIAGNOSIS — E119 Type 2 diabetes mellitus without complications: Secondary | ICD-10-CM

## 2021-08-22 DIAGNOSIS — K219 Gastro-esophageal reflux disease without esophagitis: Secondary | ICD-10-CM

## 2021-08-23 NOTE — Chronic Care Management (AMB) (Signed)
Chronic Care Management   CCM RN Visit Note  08/22/2021 Name: [name] MRN: [id number] DOB: 12/08/1965  Subjective: Linda Mercado is a 56 y.o. year old female who is a primary care patient of [name], [place]. The care management team was consulted for assistance with disease management and care coordination needs.    Engaged with patient by telephone for follow up visit in response to provider referral for case management and/or care coordination services.   Consent to Services:  The patient was given information about Chronic Care Management services, agreed to services, and gave verbal consent prior to initiation of services.  Please see initial visit note for detailed documentation.   Patient agreed to services and verbal consent obtained.   Assessment: Review of patient past medical history, allergies, medications, health status, including review of consultants reports, laboratory and other test data, was performed as part of comprehensive evaluation and provision of chronic care management services.   SDOH (Social Determinants of Health) assessments and interventions performed:  Yes, no acute challenges   CCM Care Plan  Allergies  Allergen Reactions   Lisinopril Hives    Other reaction(s): anaphylaxis/angioedema   Latex Hives   Morphine And Related     Itching, skin get hot.    Adhesive [Tape] Rash    Outpatient Encounter Medications as of 08/22/2021  Medication Sig Note   albuterol (VENTOLIN HFA) 108 (90 Base) MCG/ACT inhaler Inhale 2 puffs into the lungs every 6 (six) hours as needed for wheezing or shortness of breath.    Alcohol Swabs PADS Use as directed to check blood sugars    AMITIZA 24 MCG capsule Take 24 mcg by mouth 2 (two) times daily.     atenolol (TENORMIN) 100 MG tablet TAKE 1 TABLET BY MOUTH EVERY DAY    atorvastatin (LIPITOR) 10 MG tablet TAKE 1 TABLET(10 MG) BY MOUTH AT BEDTIME    BD PEN NEEDLE NANO 2ND GEN 32G X 4 MM MISC USE AS DIRECTED WITH  TRESIBA DAILY    Blood Glucose Monitoring Suppl (ONETOUCH VERIO) w/Device KIT Use as directed to check 1 time per day dx: e11.65    calcium gluconate 500 MG tablet Take 1 tablet (500 mg total) by mouth 3 (three) times daily.    CAPLYTA 42 MG CAPS Take 42 mg by mouth at bedtime.     clonazePAM (KLONOPIN) 1 MG tablet Take 1 mg by mouth 3 (three) times daily.    Cyanocobalamin (B-12) 500 MCG SUBL One SL QD    dexlansoprazole (DEXILANT) 60 MG capsule Take 1 capsule (60 mg total) by mouth daily. Patient will need appt for further refills    diclofenac Sodium (VOLTAREN) 1 % GEL APPLY 4 GRAMS TO THE AFFECTED AREA FOUR TIMES DAILY    docusate sodium (COLACE) 50 MG capsule Take 50 mg by mouth daily.     FLOVENT HFA 110 MCG/ACT inhaler INHALE 2 PUFFS INTO THE LUNGS EVERY 12 HOURS    Glucagon (GVOKE HYPOPEN 2-PACK) 0.5 MG/0.1ML SOAJ Inject 1 each into the skin as needed.    glucose blood (ACCU-CHEK GUIDE) test strip USE TO TEST TWICE DAILY AS DIRECTED    hydrochlorothiazide (HYDRODIURIL) 12.5 MG tablet TAKE 1 TABLET(12.5 MG) BY MOUTH DAILY    HYDROmorphone (DILAUDID) 4 MG tablet Take 4 mg by mouth 4 (four) times daily as needed for severe pain.    hydrOXYzine (ATARAX/VISTARIL) 25 MG tablet Take 25 mg by mouth in the morning, at noon, in the evening, and  at bedtime.    JANUMET 50-1000 MG tablet TAKE 1 TABLET BY MOUTH TWICE A DAY WITH A MEAL    lidocaine (XYLOCAINE) 2 % solution Use as directed 15 mLs in the mouth or throat 3 (three) times a week.     Lidocaine 4 % PTCH Place 1 patch onto the skin in the morning and at bedtime.    LORazepam (ATIVAN) 2 MG tablet Take 2 mg by mouth 4 (four) times daily as needed for anxiety.     losartan (COZAAR) 25 MG tablet Take 1 tablet (25 mg total) by mouth daily.    meclizine (ANTIVERT) 12.5 MG tablet TAKE 1 TABLET BY MOUTH 3 TIMES DAILY AS NEEDED FOR DIZZINESS.    Multiple Vitamin (MULTIVITAMIN WITH MINERALS) TABS tablet Take 1 tablet by mouth daily.     NARCAN 4  MG/0.1ML LIQD nasal spray kit Place 1 spray into the nose as directed.    Norgestimate-Ethinyl Estradiol Triphasic (TRI-SPRINTEC) 0.18/0.215/0.25 MG-35 MCG tablet Take 1 tablet by mouth daily.    ondansetron (ZOFRAN ODT) 4 MG disintegrating tablet 41m ODT q4 hours prn nausea/vomit    ondansetron (ZOFRAN) 4 MG tablet Take 4 mg by mouth as needed for nausea.     OneTouch Delica Lancets 346TMISC Use as directed to check 1 time per day dx: e11.65    ONETOUCH VERIO test strip Use as directed to check 1 time per day dx: e11.65    PAIN MANAGEMENT INTRATHECAL, IT, PUMP 1 each by Intrathecal route continuous. Intrathecal (IT) medication: fentanyl, clonidine, bupivicaine  Fentanyl 2574mbupivicaine150mg/clonidine2mg  51.9737mer day, 2mg41mr day, 1.44mc31m.4ml/q44m    polyethylene glycol powder (GLYCOLAX/MIRALAX) 17 GM/SCOOP powder MIX 17 GRAMS IN 8 OUNCES OF LIQUID AND DRINK DAILY FOR BOWELS    promethazine (PHENERGAN) 25 MG tablet Take 1 tablet (25 mg total) by mouth every 6 (six) hours as needed for nausea or vomiting.    QUEtiapine (SEROQUEL XR) 400 MG 24 hr tablet Take 400 mg by mouth 2 (two) times daily.    QUEtiapine (SEROQUEL) 300 MG tablet Take 300 mg by mouth at bedtime.    tiZANidine (ZANAFLEX) 4 MG tablet Take 4 mg by mouth every 4 (four) hours.    traZODone (DESYREL) 100 MG tablet 5 qhs prescribed by psychiatry (Patient taking differently: Take 100-200 mg by mouth at bedtime. 5 qhs prescribed by psychiatry)    triamcinolone cream (KENALOG) 0.1 % Apply 1 application topically daily as needed (for itching).    TRINTELLIX 20 MG TABS tablet Take 20 mg by mouth daily.    VRAYLAR 6 MG CAPS Take 6 mg by mouth daily. 06/21/2020: D/C by psychiatrist Lida PShelbie Hutchingatient    No facility-administered encounter medications on file as of 08/22/2021.    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   Schizoaffective disorder, bipolar type ([place]) 09/19/2016   Chronic pain 03/05/2016   Non morbid obesity due to excess calories 03/05/2016   S/P lumbar fusion 03/05/2016    Conditions to be addressed/monitored: Type 2 diabetes Mellitus, Essential hypertension, Chronic pain syndrome, Hashimoto's disease, Lumbar radiculopathy, GERD   Care Plan : RN Care Manager Plan of Care  Updates made by Lynne Crammer, RN since 08/22/2021 12:00 AM     Problem: No plan established for care established for management of chronic disease states (Type 2 diabetes Mellitus, Essential hypertension, Chronic pain syndrome, Hashimoto's disease, Lumbar radiculopathy, GERD)   Priority: High     Long-Range Goal: Development of plan of care for chronic disease management for (Type 2 diabetes Mellitus, Essential hypertension, Chronic pain syndrome, Hashimoto's disease, Lumbar radiculopathy, GERD)   Start Date: 06/28/2021  Expected End Date: 06/28/2022  Recent Progress: On track  Priority: High  Note:   Current Barriers:  Knowledge Deficits related  to plan of care for management of Type 2 diabetes Mellitus, Essential hypertension, Chronic pain syndrome, Hashimoto's disease, Lumbar radiculopathy, GERD   Chronic Disease Management support and education needs related to Type 2 diabetes Mellitus, Essential hypertension, Chronic pain syndrome, Hashimoto's disease, Lumbar radiculopathy, GERD   Lacks caregiver support Transportation barriers  RNCM Clinical Goal(s):  Patient will verbalize basic understanding of  Type 2 diabetes Mellitus, Essential hypertension, Chronic pain syndrome, Hashimoto's disease, Lumbar radiculopathy, GERD  disease process and self health management plan as evidenced by patient will report having no disease exacerbations related to her chronic disease states listed above take all medications exactly as prescribed and will call provider for medication related questions as evidenced by patient will report having no missed doses of her prescribed medications  demonstrate Improved health management independence as evidenced by patient will report 100% adherence to following her prescribed treatment plan continue to work with RN Care Manager to address care management and care coordination needs related to  Type 2 diabetes Mellitus, Essential hypertension, Chronic pain syndrome, Hashimoto's disease, Lumbar radiculopathy, GERD  as evidenced by adherence to CM Team Scheduled appointments through collaboration with RN Care manager, provider, and care team.   Interventions: 1:1 collaboration with primary care provider regarding development and update of comprehensive plan of care as evidenced by provider attestation and co-signature Inter-disciplinary care team collaboration (see longitudinal plan of care) Evaluation of current treatment plan related to  self management and patient's adherence to plan as established by provider  Diabetes Interventions:  (Status:  Goal on track:  Yes.) Long Term Goal Assessed patient's understanding of  A1c goal:  <5.7% Provided education to patient about basic DM disease process Reviewed medications with patient and discussed importance of medication adherence Provided patient with written educational materials related to hypo and hyperglycemia and importance of correct treatment Review of patient status, including review of consultants reports, relevant laboratory and other test results, and medications completed Assessed social determinant of health barriers Discussed plans with patient for ongoing care management follow up and provided patient with direct contact information for care management team Lab Results  Component Value Date   HGBA1C 5.9 (H) 02/07/2021     Pain Interventions:  (Status:  Goal on track:  Yes.) Long Term Goal Pain assessment performed Medications reviewed Reviewed provider established plan for pain management Discussed importance of adherence to all scheduled medical appointments Counseled on the importance of reporting any/all new or changed pain symptoms or management strategies to pain management provider Advised patient to report to care team affect of pain on daily activities Discussed use of relaxation techniques and/or diversional activities to assist with pain reduction (distraction, imagery, relaxation, massage, acupressure, TENS, heat, and cold application Reviewed with patient prescribed pharmacological and nonpharmacological pain relief strategies Screening for signs and symptoms of 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  Assessed social determinant of health barriers Discussed plans with patient for ongoing care management follow up and provided patient with direct contact information for care management team  Falls Interventions:  (Status:  Goal on track:  NO.) Long Term Goal Determined patient experienced a recent fall outside of her home when trying to get into her car, she was transported to the ED but eloped approximately 12 hours after  arriving due to not being seen by a physician Advised patient of importance of notifying provider of falls Assessed for fall related injury, patient reports her back pain has worsened since having the fall Determined patient followed up with her pain doctor following the fall and received a refill to her pain pump Assessed for DME adherence, patient is using her walker and power W/C at all times, she denies having other DME needs at this time  Reviewed scheduled/upcoming provider appointments including: next PCP follow up appointment scheduled for 09/19/21 @2 :40 PM  Determined patient continues to need assistance with transportation, sent in basket message to [place] requesting assistance  Patient Goals/Self-Care Activities: Take all medications as prescribed Attend all scheduled provider appointments Call pharmacy for medication refills 3-7 days in advance of running out of medications Call provider office for new concerns or questions  drink 6 to 8 glasses of water each day fill half of plate with vegetables manage portion size  Follow Up Plan:  Telephone follow up appointment with care management team member scheduled for:  09/21/21      Plan:Telephone follow up appointment with care management team member scheduled for:  09/21/21  Barb Merino, RN, BSN, CCM Care Management Coordinator [place] [facility]  Direct Phone: [phone]

## 2021-08-23 NOTE — Patient Instructions (Signed)
Visit Information  Thank you for taking time to visit with me today. Please don't hesitate to contact me if I can be of assistance to you before our next scheduled telephone appointment.  Following are the goals we discussed today:  (Copy and paste patient goals from clinical care plan here)  Our next appointment is by telephone on 09/21/21 at 2:05 PM   Please call the care guide team at [phone] if you need to cancel or reschedule your appointment.   If you are experiencing a Mental Health or [place] or need someone to talk to, please call [phone]-TALK (toll free, 24 hour hotline)   Patient verbalizes understanding of instructions and care plan provided today and agrees to view in [place]. Active MyChart status confirmed with patient.    Barb Merino, RN, BSN, CCM Care Management Coordinator [place] [facility]  Direct Phone: [phone]

## 2021-09-18 DIAGNOSIS — E119 Type 2 diabetes mellitus without complications: Secondary | ICD-10-CM

## 2021-09-18 DIAGNOSIS — I10 Essential (primary) hypertension: Secondary | ICD-10-CM

## 2021-09-19 ENCOUNTER — Ambulatory Visit (INDEPENDENT_AMBULATORY_CARE_PROVIDER_SITE_OTHER): Payer: Medicare Other | Admitting: Nurse Practitioner

## 2021-09-19 ENCOUNTER — Other Ambulatory Visit: Payer: Self-pay

## 2021-09-19 ENCOUNTER — Encounter: Payer: Self-pay | Admitting: Nurse Practitioner

## 2021-09-19 ENCOUNTER — Ambulatory Visit (INDEPENDENT_AMBULATORY_CARE_PROVIDER_SITE_OTHER): Payer: Medicare Other

## 2021-09-19 VITALS — BP 110/58 | HR 81 | Temp 97.8°F | Ht 64.0 in | Wt 135.4 lb

## 2021-09-19 VITALS — BP 110/58 | HR 81 | Temp 97.8°F | Ht 64.0 in | Wt 135.0 lb

## 2021-09-19 DIAGNOSIS — R1084 Generalized abdominal pain: Secondary | ICD-10-CM

## 2021-09-19 DIAGNOSIS — E063 Autoimmune thyroiditis: Secondary | ICD-10-CM

## 2021-09-19 DIAGNOSIS — Z0000 Encounter for general adult medical examination without abnormal findings: Secondary | ICD-10-CM

## 2021-09-19 DIAGNOSIS — E119 Type 2 diabetes mellitus without complications: Secondary | ICD-10-CM

## 2021-09-19 DIAGNOSIS — I10 Essential (primary) hypertension: Secondary | ICD-10-CM

## 2021-09-19 DIAGNOSIS — Z79899 Other long term (current) drug therapy: Secondary | ICD-10-CM

## 2021-09-19 DIAGNOSIS — E1169 Type 2 diabetes mellitus with other specified complication: Secondary | ICD-10-CM | POA: Diagnosis not present

## 2021-09-19 DIAGNOSIS — E782 Mixed hyperlipidemia: Secondary | ICD-10-CM

## 2021-09-19 DIAGNOSIS — R112 Nausea with vomiting, unspecified: Secondary | ICD-10-CM

## 2021-09-19 LAB — POCT GLUCOSE (DEVICE FOR HOME USE): POC Glucose: 93 mg/dl (ref 70–99)

## 2021-09-19 MED ORDER — LOSARTAN POTASSIUM 25 MG PO TABS: 25.0000 mg | ORAL_TABLET | Freq: Every day | ORAL | 1 refills | Status: DC

## 2021-09-19 MED ORDER — JANUMET 50-1000 MG PO TABS: 1.0000 | ORAL_TABLET | Freq: Every day | ORAL | 1 refills | Status: DC

## 2021-09-19 MED ORDER — ATORVASTATIN CALCIUM 10 MG PO TABS: ORAL_TABLET | ORAL | 1 refills | Status: DC

## 2021-09-19 MED ORDER — MECLIZINE HCL 12.5 MG PO TABS: 12.5000 mg | ORAL_TABLET | Freq: Three times a day (TID) | ORAL | 1 refills | Status: DC | PRN

## 2021-09-19 MED ORDER — JANUMET 50-1000 MG PO TABS: 1.0000 | ORAL_TABLET | Freq: Two times a day (BID) | ORAL | 1 refills | Status: DC

## 2021-09-19 MED ORDER — HYDROCHLOROTHIAZIDE 12.5 MG PO TABS: ORAL_TABLET | ORAL | 0 refills | Status: DC

## 2021-09-19 MED ORDER — ATENOLOL 100 MG PO TABS
ORAL_TABLET | ORAL | 1 refills | Status: DC
Start: 2021-09-19 — End: 2021-12-31

## 2021-09-19 NOTE — Patient Instructions (Signed)

## 2021-09-19 NOTE — Progress Notes (Signed)
This visit occurred during the SARS-CoV-2 public health emergency.  Safety protocols were in place, including screening questions prior to the visit, additional usage of staff PPE, and extensive cleaning of exam room while observing appropriate contact time as indicated for disinfecting solutions.  Subjective:   Linda Mercado is a 56 y.o. female who presents for Medicare Annual (Subsequent) preventive examination.  Review of Systems     Cardiac Risk Factors include: hypertension;diabetes mellitus     Objective:    Today's Vitals   09/19/21 1420 09/19/21 1428  BP: (!) 110/58   Pulse: 81   Temp: 97.8 F (36.6 C)   TempSrc: Oral   SpO2: 99%   Weight: 135 lb 6.4 oz (61.4 kg)   Height: 5\' 4"  (1.626 m)   PainSc:  10-Worst pain ever   Body mass index is 23.24 kg/m.  Advanced Directives 09/19/2021 09/13/2020 10/07/2019 02/23/2019 09/17/2018 08/13/2018 03/19/2018  Does Patient Have a Medical Advance Directive? No No No No No No No  Would patient like information on creating a medical advance directive? Yes (MAU/Ambulatory/Procedural Areas - Information given) Yes (MAU/Ambulatory/Procedural Areas - Information given) No - Patient declined Yes (MAU/Ambulatory/Procedural Areas - Information given) (No Data) No - Patient declined Yes (Inpatient - patient requests chaplain consult to create a medical advance directive)    Current Medications (verified) Outpatient Encounter Medications as of 09/19/2021  Medication Sig   albuterol (VENTOLIN HFA) 108 (90 Base) MCG/ACT inhaler Inhale 2 puffs into the lungs every 6 (six) hours as needed for wheezing or shortness of breath.   Alcohol Swabs PADS Use as directed to check blood sugars   AMITIZA 24 MCG capsule Take 24 mcg by mouth 2 (two) times daily.    atenolol (TENORMIN) 100 MG tablet TAKE 1 TABLET BY MOUTH EVERY DAY   atorvastatin (LIPITOR) 10 MG tablet TAKE 1 TABLET(10 MG) BY MOUTH AT BEDTIME   BD PEN NEEDLE NANO 2ND GEN 32G X 4 MM MISC USE AS DIRECTED  WITH TRESIBA DAILY   Blood Glucose Monitoring Suppl (ONETOUCH VERIO) w/Device KIT Use as directed to check 1 time per day dx: e11.65   calcium gluconate 500 MG tablet Take 1 tablet (500 mg total) by mouth 3 (three) times daily.   CAPLYTA 42 MG CAPS Take 42 mg by mouth at bedtime.    Carbamazepine (EQUETRO) 300 MG CP12 Take by mouth.   clonazePAM (KLONOPIN) 1 MG tablet Take 1 mg by mouth 3 (three) times daily.   dexlansoprazole (DEXILANT) 60 MG capsule Take 1 capsule (60 mg total) by mouth daily. Patient will need appt for further refills   diclofenac Sodium (VOLTAREN) 1 % GEL APPLY 4 GRAMS TO THE AFFECTED AREA FOUR TIMES DAILY   docusate sodium (COLACE) 50 MG capsule Take 50 mg by mouth daily.    FLOVENT HFA 110 MCG/ACT inhaler INHALE 2 PUFFS INTO THE LUNGS EVERY 12 HOURS   Glucagon (GVOKE HYPOPEN 2-PACK) 0.5 MG/0.1ML SOAJ Inject 1 each into the skin as needed.   glucose blood (ACCU-CHEK GUIDE) test strip USE TO TEST TWICE DAILY AS DIRECTED   hydrochlorothiazide (HYDRODIURIL) 12.5 MG tablet TAKE 1 TABLET(12.5 MG) BY MOUTH DAILY   HYDROmorphone (DILAUDID) 4 MG tablet Take 4 mg by mouth 4 (four) times daily as needed for severe pain.   hydrOXYzine (ATARAX/VISTARIL) 25 MG tablet Take 50 mg by mouth in the morning, at noon, in the evening, and at bedtime.   JANUMET 50-1000 MG tablet TAKE 1 TABLET BY MOUTH TWICE A DAY  WITH A MEAL   lidocaine (XYLOCAINE) 2 % solution Use as directed 15 mLs in the mouth or throat 3 (three) times a week.    Lidocaine 4 % PTCH Place 1 patch onto the skin in the morning and at bedtime.   LORazepam (ATIVAN) 2 MG tablet Take 2 mg by mouth 4 (four) times daily as needed for anxiety.    losartan (COZAAR) 25 MG tablet Take 1 tablet (25 mg total) by mouth daily.   meclizine (ANTIVERT) 12.5 MG tablet TAKE 1 TABLET BY MOUTH 3 TIMES DAILY AS NEEDED FOR DIZZINESS.   Multiple Vitamin (MULTIVITAMIN WITH MINERALS) TABS tablet Take 1 tablet by mouth daily.    NARCAN 4 MG/0.1ML LIQD  nasal spray kit Place 1 spray into the nose as directed.   Norgestimate-Ethinyl Estradiol Triphasic (TRI-SPRINTEC) 0.18/0.215/0.25 MG-35 MCG tablet Take 1 tablet by mouth daily.   ondansetron (ZOFRAN ODT) 4 MG disintegrating tablet 4mg  ODT q4 hours prn nausea/vomit   ondansetron (ZOFRAN) 4 MG tablet Take 4 mg by mouth as needed for nausea.    PAIN MANAGEMENT INTRATHECAL, IT, PUMP 1 each by Intrathecal route continuous. Intrathecal (IT) medication: fentanyl, clonidine, bupivicaine  Fentanyl 250mg /bupivicaine150mg /clonidine2mg   51.97mg  per day, 2mg  per day, 1.4mcg 15.44ml/qmin   polyethylene glycol powder (GLYCOLAX/MIRALAX) 17 GM/SCOOP powder MIX 17 GRAMS IN 8 OUNCES OF LIQUID AND DRINK DAILY FOR BOWELS   QUEtiapine (SEROQUEL XR) 400 MG 24 hr tablet Take 400 mg by mouth 2 (two) times daily.   QUEtiapine (SEROQUEL) 300 MG tablet Take 300 mg by mouth at bedtime.   tiZANidine (ZANAFLEX) 4 MG tablet Take 4 mg by mouth every 4 (four) hours.   traZODone (DESYREL) 100 MG tablet 5 qhs prescribed by psychiatry (Patient taking differently: Take 100-200 mg by mouth at bedtime. 5 qhs prescribed by psychiatry)   triamcinolone cream (KENALOG) 0.1 % Apply 1 application topically daily as needed (for itching).   TRINTELLIX 20 MG TABS tablet Take 20 mg by mouth daily.   Cyanocobalamin (B-12) 500 MCG SUBL One SL QD (Patient not taking: Reported on 09/19/2021)   desvenlafaxine (PRISTIQ) 50 MG 24 hr tablet Take by mouth.   lubiprostone (AMITIZA) 24 MCG capsule Take by mouth.   meloxicam (MOBIC) 15 MG tablet Take 15 mg by mouth daily.   OneTouch Delica Lancets 33G MISC Use as directed to check 1 time per day dx: e11.65   ONETOUCH VERIO test strip Use as directed to check 1 time per day dx: e11.65   promethazine (PHENERGAN) 25 MG tablet Take 1 tablet (25 mg total) by mouth every 6 (six) hours as needed for nausea or vomiting. (Patient not taking: Reported on 09/19/2021)   VRAYLAR 6 MG CAPS Take 6 mg by mouth daily.  (Patient not taking: Reported on 09/19/2021)   No facility-administered encounter medications on file as of 09/19/2021.    Allergies (verified) Lisinopril, Latex, Morphine and related, and Adhesive [tape]   History: Past Medical History:  Diagnosis Date   Acid reflux    Back pain    Bradycardia    Chronic back pain    Diabetes mellitus without complication (HCC)    Hypertension    Hypokalemia    Past Surgical History:  Procedure Laterality Date   APPENDECTOMY     BACK SURGERY     x6   CESAREAN SECTION     x3   ESOPHAGEAL MANOMETRY N/A 08/25/2019   Procedure: ESOPHAGEAL MANOMETRY (EM);  Surgeon: Jeani Hawking, MD;  Location: [facility];  Service:  Endoscopy;  Laterality: N/A;   HERNIA REPAIR     Family History  Problem Relation Age of Onset   Kidney disease Mother    Hypertension Mother    Diabetes Father    Hypertension Father    Bipolar disorder Sister    Schizophrenia Sister    Neuropathy Sister    Hypertension Sister    Bipolar disorder Daughter    Post-traumatic stress disorder Daughter    Asthma Daughter    Depression Daughter    Bipolar disorder Daughter    Post-traumatic stress disorder Daughter    Asthma Daughter    Depression Daughter    Asthma Daughter    Depression Daughter    Asthma Daughter    Breast cancer Paternal Grandmother    Bipolar disorder Brother    Bipolar disorder Brother    Schizophrenia Brother    Stroke Neg Hx    Cancer Neg Hx    CAD Neg Hx    Social History   Socioeconomic History   Marital status: Divorced    Spouse name: Not on file   Number of children: Not on file   Years of education: Not on file   Highest education level: Not on file  Occupational History   Occupation: disability  Tobacco Use   Smoking status: Never   Smokeless tobacco: Never  Vaping Use   Vaping Use: Never used  Substance and Sexual Activity   Alcohol use: Not Currently   Drug use: Never   Sexual activity: Yes  Other Topics Concern   Not on  file  Social History Narrative   Caffeine- none   Social Determinants of Health   Financial Resource Strain: Low Risk    Difficulty of Paying Living Expenses: Not hard at all  Food Insecurity: No Food Insecurity   Worried About Programme researcher, broadcasting/film/video in the Last Year: Never true   Ran Out of Food in the Last Year: Never true  Transportation Needs: Unmet Transportation Needs   Lack of Transportation (Medical): Yes   Lack of Transportation (Non-Medical): No  Physical Activity: Inactive   Days of Exercise per Week: 0 days   Minutes of Exercise per Session: 0 min  Stress: Stress Concern Present   Feeling of Stress : Very much  Social Connections: Not on file    Tobacco Counseling Counseling given: Not Answered   Clinical Intake:  Pre-visit preparation completed: Yes  Pain : 0-10 Pain Score: 10-Worst pain ever Pain Type: Chronic pain Pain Location: Back Pain Orientation: Upper, Mid, Lower Pain Radiating Towards: shoots down legs Pain Descriptors / Indicators: Shooting, Sharp Pain Onset: More than a month ago Pain Frequency: Constant Pain Relieving Factors: rest , pain pump and oral medication  Pain Relieving Factors: rest , pain pump and oral medication  Nutritional Status: BMI of 19-24  Normal Nutritional Risks: Nausea/ vomitting/ diarrhea (nausra and vomiting past 2 months) Diabetes: No  How often do you need to have someone help you when you read instructions, pamphlets, or other written materials from your doctor or pharmacy?: 1 - Never What is the last grade level you completed in school?: 49yr college  Diabetic? Yes Nutrition Risk Assessment:  Has the patient had any N/V/D within the last 2 months?  Yes  Does the patient have any non-healing wounds?  No  Has the patient had any unintentional weight loss or weight gain?  No   Diabetes:  Is the patient diabetic?  Yes  If diabetic, was a CBG obtained  today?  No  Did the patient bring in their glucometer from  home?  No  How often do you monitor your CBG's? Twice daily.   Financial Strains and Diabetes Management:  Are you having any financial strains with the device, your supplies or your medication? No .  Does the patient want to be seen by Chronic Care Management for management of their diabetes?  No  Would the patient like to be referred to a Nutritionist or for Diabetic Management?  No   Diabetic Exams:  Diabetic Eye Exam: Overdue for diabetic eye exam. Pt has been advised about the importance in completing this exam. Patient advised to call and schedule an eye exam. Diabetic Foot Exam: Completed 02/07/2021   Interpreter Needed?: No  Information entered by :: NAllen LPN   Activities of Daily Living In your present state of health, do you have any difficulty performing the following activities: 09/19/2021  Hearing? N  Vision? N  Difficulty concentrating or making decisions? Y  Walking or climbing stairs? Y  Dressing or bathing? Y  Doing errands, shopping? N  Preparing Food and eating ? Y  Using the Toilet? N  In the past six months, have you accidently leaked urine? N  Do you have problems with loss of bowel control? N  Managing your Medications? N  Managing your Finances? N  Housekeeping or managing your Housekeeping? Y  Some recent data might be hidden    Patient Care Team: [name], FNP as PCP - General (General Practice) Clarene Duke, Karma Lew, RN as Case Manager Harlan Stains, [facility] (Pharmacist)  Indicate any recent Medical Services you may have received from other than [facility] providers in the past year (date may be approximate).     Assessment:   This is a routine wellness examination for Linda Mercado.  Hearing/Vision screen Vision Screening - Comments:: Regular eye exams , [facility]  Dietary issues and exercise activities discussed: Current Exercise Habits: The patient does not participate in regular exercise at present   Goals Addressed             This  Visit's Progress    Patient Stated       09/19/2021, everything clear up       Depression Screen PHQ 2/9 Scores 09/19/2021 09/13/2020 03/25/2019 02/18/2019 09/17/2018 06/23/2018  PHQ - 2 Score PHQ- 9 Score Fall Risk Fall Risk  09/19/2021 09/13/2020 04/29/2019 03/25/2019 02/18/2019  Falls in the past year? 1 1 0 1 0  Comment legs give out lost balance - - -  Number falls in past yr: 0 1  Injury with Fall? 1 0 1 1 0  Risk for fall due to : Impaired mobility;Impaired balance/gait;Medication side effect History of fall(s);Impaired balance/gait;Impaired mobility;Medication side effect - - -  Follow up Falls evaluation completed;Education provided;Falls prevention discussed Falls evaluation completed;Education provided;Falls prevention discussed - - -    FALL RISK PREVENTION PERTAINING TO THE HOME:  Any stairs in or around the home? Yes  If so, are there any without handrails? No  Home free of loose throw rugs in walkways, pet beds, electrical cords, etc? Yes  Adequate lighting in your home to reduce risk of falls? Yes   ASSISTIVE DEVICES UTILIZED TO PREVENT FALLS:  Life alert? No  Use of a cane, walker or w/c? Yes  Grab bars in the bathroom? No  Shower chair or bench  in shower? Yes  Elevated toilet seat or a handicapped toilet? Yes   TIMED UP AND GO:  Was the test performed? No .    Gait slow and steady with assistive device  Cognitive Function:     6CIT Screen 09/13/2020 09/17/2018 09/17/2018  What Year? 0 points 0 points 0 points  What month? 0 points 0 points 0 points  What time? 0 points 0 points 0 points  Count back from 20 0 points 0 points 0 points  Months in reverse 0 points 0 points 2 points  Repeat phrase 4 points 10 points 2 points  Total Score 4 10 4     Immunizations Immunization History  Administered Date(s) Administered   Influenza Inj Mdck Quad With Preservative 05/11/2018   Influenza Nasal 04/10/2011   Influenza Split  05/18/2012   Influenza, Seasonal, Injecte, Preservative Fre 05/18/2014   Influenza,Quad,Nasal, Live 06/07/2013   Influenza,inj,Quad PF,6+ Mos 09/19/2016, 04/24/2017   PFIZER Comirnaty(Gray Top)Covid-19 Tri-Sucrose Vaccine 12/31/2020   PFIZER(Purple Top)SARS-COV-2 Vaccination 06/05/2020, 06/26/2020, 12/31/2020   Tdap 05/18/2014    TDAP status: Up to date  Flu Vaccine status: Declined, Education has been provided regarding the importance of this vaccine but patient still declined. Advised may receive this vaccine at local pharmacy or Health Dept. Aware to provide a copy of the vaccination record if obtained from local pharmacy or Health Dept. Verbalized acceptance and understanding.  Pneumococcal vaccine status: Up to date  Covid-19 vaccine status: Completed vaccines  Qualifies for Shingles Vaccine? Yes   Zostavax completed No   Shingrix Completed?: No.    Education has been provided regarding the importance of this vaccine. Patient has been advised to call insurance company to determine out of pocket expense if they have not yet received this vaccine. Advised may also receive vaccine at local pharmacy or Health Dept. Verbalized acceptance and understanding.  Screening Tests Health Maintenance  Topic Date Due   Zoster Vaccines- Shingrix (1 of 2) Never done   INFLUENZA VACCINE  02/19/2021   COVID-19 Vaccine (5 - Booster for Pfizer series) 02/25/2021   HEMOGLOBIN A1C  08/10/2021   OPHTHALMOLOGY EXAM  09/08/2021   FOOT EXAM  02/07/2022   PAP SMEAR-Modifier  04/28/2022   MAMMOGRAM  02/10/2023   TETANUS/TDAP  05/18/2024   COLONOSCOPY (Pts 45-46yrs Insurance coverage will need to be confirmed)  01/10/2031   Hepatitis C Screening  Completed   HIV Screening  Completed   HPV VACCINES  Aged Out    Health Maintenance  Health Maintenance Due  Topic Date Due   Zoster Vaccines- Shingrix (1 of 2) Never done   INFLUENZA VACCINE  02/19/2021   COVID-19 Vaccine (5 - Booster for Pfizer series)  02/25/2021   HEMOGLOBIN A1C  08/10/2021   OPHTHALMOLOGY EXAM  09/08/2021    Colorectal cancer screening: Type of screening: Colonoscopy. Completed 01/09/2021. Repeat every 10 years  Mammogram status: Completed 02/09/2021. Repeat every year  Bone Density status: n/a  Lung Cancer Screening: (Low Dose CT Chest recommended if Age 43-80 years, 30 pack-year currently smoking OR have quit w/in 15years.) does not qualify.   Lung Cancer Screening Referral: no  Additional Screening:  Hepatitis C Screening: does qualify; Completed 08/24/2020  Vision Screening: Recommended annual ophthalmology exams for early detection of glaucoma and other disorders of the eye. Is the patient up to date with their annual eye exam?  Yes  Who is the provider or what is the name of the office in which the patient attends annual eye exams? [facility] If pt is not established with a provider, would they like to be referred to a provider to establish care? No .   Dental Screening: Recommended annual dental exams for proper oral hygiene  Community Resource Referral / Chronic Care Management: CRR required this visit?  Yes   CCM required this visit?  No      Plan:     I have personally reviewed and noted the following in the patient's chart:   Medical and social history Use of alcohol, tobacco or illicit drugs  Current medications and supplements including opioid prescriptions.  Functional ability and status Nutritional status Physical activity Advanced directives List of other physicians Hospitalizations, surgeries, and ER visits in previous 12 months Vitals Screenings to include cognitive, depression, and falls Referrals and appointments  In addition, I have reviewed and discussed with patient certain preventive protocols, quality metrics, and best practice recommendations. A written personalized care plan for preventive services as well as general preventive health recommendations were provided to  patient.     Barb Merino, LPN   [id number]   Nurse Notes: needs assistance with transportation  6 CIT not administered. Patient was cognitive per direct conversation

## 2021-09-19 NOTE — Patient Instructions (Signed)
Linda Mercado , Thank you for taking time to come for your Medicare Wellness Visit. I appreciate your ongoing commitment to your health goals. Please review the following plan we discussed and let me know if I can assist you in the future.   Screening recommendations/referrals: Colonoscopy: completed 12/09/2019, due 12/08/2029 Mammogram: completed 02/09/2021, due 02/10/2022 Bone Density: n/a Recommended yearly ophthalmology/optometry visit for glaucoma screening and checkup Recommended yearly dental visit for hygiene and checkup  Vaccinations: Influenza vaccine: decline Pneumococcal vaccine: n/a Tdap vaccine: completed 05/18/2014, due 05/18/2024 Shingles vaccine: decline  Covid-19:  12/31/2020, 06/26/2020, 06/05/2020  Advanced directives: Advance directive discussed with you today. I have provided a copy for you to complete at home and have notarized. Once this is complete please bring a copy in to our office so we can scan it into your chart.  Conditions/risks identified: none  Next appointment: Follow up in one year for your annual wellness visit.   Preventive Care 40-64 Years, Female Preventive care refers to lifestyle choices and visits with your health care provider that can promote health and wellness. What does preventive care include? A yearly physical exam. This is also called an annual well check. Dental exams once or twice a year. Routine eye exams. Ask your health care provider how often you should have your eyes checked. Personal lifestyle choices, including: Daily care of your teeth and gums. Regular physical activity. Eating a healthy diet. Avoiding tobacco and drug use. Limiting alcohol use. Practicing safe sex. Taking low-dose aspirin daily starting at age 96. Taking vitamin and mineral supplements as recommended by your health care provider. What happens during an annual well check? The services and screenings done by your health care provider during your annual well  check will depend on your age, overall health, lifestyle risk factors, and family history of disease. Counseling  Your health care provider may ask you questions about your: Alcohol use. Tobacco use. Drug use. Emotional well-being. Home and relationship well-being. Sexual activity. Eating habits. Work and work Statistician. Method of birth control. Menstrual cycle. Pregnancy history. Screening  You may have the following tests or measurements: Height, weight, and BMI. Blood pressure. Lipid and cholesterol levels. These may be checked every 5 years, or more frequently if you are over 48 years old. Skin check. Lung cancer screening. You may have this screening every year starting at age 38 if you have a 30-pack-year history of smoking and currently smoke or have quit within the past 15 years. Fecal occult blood test (FOBT) of the stool. You may have this test every year starting at age 61. Flexible sigmoidoscopy or colonoscopy. You may have a sigmoidoscopy every 5 years or a colonoscopy every 10 years starting at age 45. Hepatitis C blood test. Hepatitis B blood test. Sexually transmitted disease (STD) testing. Diabetes screening. This is done by checking your blood sugar (glucose) after you have not eaten for a while (fasting). You may have this done every 1-3 years. Mammogram. This may be done every 1-2 years. Talk to your health care provider about when you should start having regular mammograms. This may depend on whether you have a family history of breast cancer. BRCA-related cancer screening. This may be done if you have a family history of breast, ovarian, tubal, or peritoneal cancers. Pelvic exam and Pap test. This may be done every 3 years starting at age 86. Starting at age 79, this may be done every 5 years if you have a Pap test in combination with an HPV  test. Bone density scan. This is done to screen for osteoporosis. You may have this scan if you are at high risk for  osteoporosis. Discuss your test results, treatment options, and if necessary, the need for more tests with your health care provider. Vaccines  Your health care provider may recommend certain vaccines, such as: Influenza vaccine. This is recommended every year. Tetanus, diphtheria, and acellular pertussis (Tdap, Td) vaccine. You may need a Td booster every 10 years. Zoster vaccine. You may need this after age 82. Pneumococcal 13-valent conjugate (PCV13) vaccine. You may need this if you have certain conditions and were not previously vaccinated. Pneumococcal polysaccharide (PPSV23) vaccine. You may need one or two doses if you smoke cigarettes or if you have certain conditions. Talk to your health care provider about which screenings and vaccines you need and how often you need them. This information is not intended to replace advice given to you by your health care provider. Make sure you discuss any questions you have with your health care provider. Document Released: 08/04/2015 Document Revised: 03/27/2016 Document Reviewed: 05/09/2015 Elsevier Interactive Patient Education  2017 [place] Prevention in the Home Falls can cause injuries. They can happen to people of all ages. There are many things you can do to make your home safe and to help prevent falls. What can I do on the outside of my home? Regularly fix the edges of walkways and driveways and fix any cracks. Remove anything that might make you trip as you walk through a door, such as a raised step or threshold. Trim any bushes or trees on the path to your home. Use bright outdoor lighting. Clear any walking paths of anything that might make someone trip, such as rocks or tools. Regularly check to see if handrails are loose or broken. Make sure that both sides of any steps have handrails. Any raised decks and porches should have guardrails on the edges. Have any leaves, snow, or ice cleared regularly. Use sand or  salt on walking paths during winter. Clean up any spills in your garage right away. This includes oil or grease spills. What can I do in the bathroom? Use night lights. Install grab bars by the toilet and in the tub and shower. Do not use towel bars as grab bars. Use non-skid mats or decals in the tub or shower. If you need to sit down in the shower, use a plastic, non-slip stool. Keep the floor dry. Clean up any water that spills on the floor as soon as it happens. Remove soap buildup in the tub or shower regularly. Attach bath mats securely with double-sided non-slip rug tape. Do not have throw rugs and other things on the floor that can make you trip. What can I do in the bedroom? Use night lights. Make sure that you have a light by your bed that is easy to reach. Do not use any sheets or blankets that are too big for your bed. They should not hang down onto the floor. Have a firm chair that has side arms. You can use this for support while you get dressed. Do not have throw rugs and other things on the floor that can make you trip. What can I do in the kitchen? Clean up any spills right away. Avoid walking on wet floors. Keep items that you use a lot in easy-to-reach places. If you need to reach something above you, use a strong step stool that has a grab  bar. Keep electrical cords out of the way. Do not use floor polish or wax that makes floors slippery. If you must use wax, use non-skid floor wax. Do not have throw rugs and other things on the floor that can make you trip. What can I do with my stairs? Do not leave any items on the stairs. Make sure that there are handrails on both sides of the stairs and use them. Fix handrails that are broken or loose. Make sure that handrails are as long as the stairways. Check any carpeting to make sure that it is firmly attached to the stairs. Fix any carpet that is loose or worn. Avoid having throw rugs at the top or bottom of the stairs. If  you do have throw rugs, attach them to the floor with carpet tape. Make sure that you have a light switch at the top of the stairs and the bottom of the stairs. If you do not have them, ask someone to add them for you. What else can I do to help prevent falls? Wear shoes that: Do not have high heels. Have rubber bottoms. Are comfortable and fit you well. Are closed at the toe. Do not wear sandals. If you use a stepladder: Make sure that it is fully opened. Do not climb a closed stepladder. Make sure that both sides of the stepladder are locked into place. Ask someone to hold it for you, if possible. Clearly mark and make sure that you can see: Any grab bars or handrails. First and last steps. Where the edge of each step is. Use tools that help you move around (mobility aids) if they are needed. These include: Canes. Walkers. Scooters. Crutches. Turn on the lights when you go into a dark area. Replace any light bulbs as soon as they burn out. Set up your furniture so you have a clear path. Avoid moving your furniture around. If any of your floors are uneven, fix them. If there are any pets around you, be aware of where they are. Review your medicines with your doctor. Some medicines can make you feel dizzy. This can increase your chance of falling. Ask your doctor what other things that you can do to help prevent falls. This information is not intended to replace advice given to you by your health care provider. Make sure you discuss any questions you have with your health care provider. Document Released: 05/04/2009 Document Revised: 12/14/2015 Document Reviewed: 08/12/2014 Elsevier Interactive Patient Education  2017 [employer].

## 2021-09-19 NOTE — Progress Notes (Signed)
I,Tianna Badgett,acting as a Neurosurgeon for [employer], FNP.,have documented all relevant documentation on the behalf of [name], FNP,as directed by  [name], FNP while in the presence of [name], FNP.  This visit occurred during the SARS-CoV-2 public health emergency.  Safety protocols were in place, including screening questions prior to the visit, additional usage of staff PPE, and extensive cleaning of exam room while observing appropriate contact time as indicated for disinfecting solutions.  Subjective:     Patient ID: Linda Mercado , female    DOB: 07-16-1966 , 56 y.o.   MRN: [id number]   Chief Complaint  Patient presents with   Diabetes   Hypertension    HPI  Patient is here for diabetes and htn follow up.     Patient is here for diabetes and htn follow up. She reports vomiting for the last 3 months, she feels like 4 times a week. If she takes the zofran in the morning she is okay until the evening she will take a few bites and will nausea/vomiting. She has called Dr. Elnoria Howard but has not been able to get an update.    She ate a fruit cup prior to coming to the office   She is planning to have surgery to her back will not know when until March this is to relocate the pain pump.      Diabetes She presents for her follow-up diabetic visit. She has type 2 diabetes mellitus. Pertinent negatives for diabetes include no fatigue. There are no hypoglycemic complications. There are no diabetic complications. Current diabetic treatment includes oral agent (dual therapy). She is following a generally healthy diet. When asked about meal planning, she reported none. She has had a previous visit with a dietitian. She rarely participates in exercise. ((Blood sugar 70-90 reports has been as low as 30) ) Eye exam current: (appt is 11/14/2021).  Hypertension This is a chronic problem. The current episode started more than 1 year ago. The problem is controlled. Associated symptoms  include malaise/fatigue. Pertinent negatives include no anxiety. There are no associated agents to hypertension. Risk factors for coronary artery disease include diabetes mellitus. There are no compliance problems.  There is no history of angina. There is no history of chronic renal disease.  Dizziness This is a chronic problem. The current episode started more than 1 month ago. The problem occurs daily. Pertinent negatives include no fatigue. She has tried nothing for the symptoms.    Past Medical History:  Diagnosis Date   Acid reflux    Back pain    Bradycardia    Chronic back pain    Diabetes mellitus without complication (HCC)    Hypertension    Hypokalemia      Family History  Problem Relation Age of Onset   Kidney disease Mother    Hypertension Mother    Diabetes Father    Hypertension Father    Bipolar disorder Sister    Schizophrenia Sister    Neuropathy Sister    Hypertension Sister    Bipolar disorder Daughter    Post-traumatic stress disorder Daughter    Asthma Daughter    Depression Daughter    Bipolar disorder Daughter    Post-traumatic stress disorder Daughter    Asthma Daughter    Depression Daughter    Asthma Daughter    Depression Daughter    Asthma Daughter    Breast cancer Paternal Grandmother    Bipolar disorder Brother    Bipolar disorder Brother  Schizophrenia Brother    Stroke Neg Hx    Cancer Neg Hx    CAD Neg Hx      Current Outpatient Medications:    albuterol (VENTOLIN HFA) 108 (90 Base) MCG/ACT inhaler, Inhale 2 puffs into the lungs every 6 (six) hours as needed for wheezing or shortness of breath., Disp: 20.1 g, Rfl: 1   Alcohol Swabs PADS, Use as directed to check blood sugars, Disp: 100 each, Rfl: 2   AMITIZA 24 MCG capsule, Take 24 mcg by mouth 2 (two) times daily. , Disp: , Rfl:    atenolol (TENORMIN) 100 MG tablet, TAKE 1 TABLET BY MOUTH EVERY DAY, Disp: 90 tablet, Rfl: 1   atorvastatin (LIPITOR) 10 MG tablet, TAKE 1 TABLET(10  MG) BY MOUTH AT BEDTIME, Disp: 90 tablet, Rfl: 1   BD PEN NEEDLE NANO 2ND GEN 32G X 4 MM MISC, USE AS DIRECTED WITH TRESIBA DAILY, Disp: 100 each, Rfl: 1   Blood Glucose Monitoring Suppl (ONETOUCH VERIO) w/Device KIT, Use as directed to check 1 time per day dx: e11.65, Disp: 1 kit, Rfl: 1   calcium gluconate 500 MG tablet, Take 1 tablet (500 mg total) by mouth 3 (three) times daily., Disp: 270 tablet, Rfl: 1   CAPLYTA 42 MG CAPS, Take 42 mg by mouth at bedtime. , Disp: , Rfl:    Carbamazepine (EQUETRO) 300 MG CP12, Take by mouth., Disp: , Rfl:    clonazePAM (KLONOPIN) 1 MG tablet, Take 1 mg by mouth 3 (three) times daily., Disp: , Rfl:    Cyanocobalamin (B-12) 500 MCG SUBL, One SL QD (Patient not taking: Reported on 09/19/2021), Disp: 90 tablet, Rfl: 1   desvenlafaxine (PRISTIQ) 50 MG 24 hr tablet, Take by mouth., Disp: , Rfl:    dexlansoprazole (DEXILANT) 60 MG capsule, Take 1 capsule (60 mg total) by mouth daily. Patient will need appt for further refills, Disp: 30 capsule, Rfl: 0   diclofenac Sodium (VOLTAREN) 1 % GEL, APPLY 4 GRAMS TO THE AFFECTED AREA FOUR TIMES DAILY, Disp: 400 g, Rfl: 1   docusate sodium (COLACE) 50 MG capsule, Take 50 mg by mouth daily. , Disp: , Rfl:    FLOVENT HFA 110 MCG/ACT inhaler, INHALE 2 PUFFS INTO THE LUNGS EVERY 12 HOURS, Disp: 12 each, Rfl: 2   Glucagon (GVOKE HYPOPEN 2-PACK) 0.5 MG/0.1ML SOAJ, Inject 1 each into the skin as needed., Disp: 0.2 mL, Rfl: 2   glucose blood (ACCU-CHEK GUIDE) test strip, USE TO TEST TWICE DAILY AS DIRECTED, Disp: 100 strip, Rfl: 5   hydrochlorothiazide (HYDRODIURIL) 12.5 MG tablet, TAKE 1 TABLET(12.5 MG) BY MOUTH DAILY, Disp: 90 tablet, Rfl: 0   HYDROmorphone (DILAUDID) 4 MG tablet, Take 4 mg by mouth 4 (four) times daily as needed for severe pain., Disp: , Rfl:    hydrOXYzine (ATARAX/VISTARIL) 25 MG tablet, Take 50 mg by mouth in the morning, at noon, in the evening, and at bedtime., Disp: , Rfl:    lidocaine (XYLOCAINE) 2 % solution,  Use as directed 15 mLs in the mouth or throat 3 (three) times a week. , Disp: , Rfl:    Lidocaine 4 % PTCH, Place 1 patch onto the skin in the morning and at bedtime., Disp: 60 patch, Rfl: 5   LORazepam (ATIVAN) 2 MG tablet, Take 2 mg by mouth 4 (four) times daily as needed for anxiety. , Disp: , Rfl:    losartan (COZAAR) 25 MG tablet, Take 1 tablet (25 mg total) by mouth daily., Disp: 90  tablet, Rfl: 1   lubiprostone (AMITIZA) 24 MCG capsule, Take by mouth., Disp: , Rfl:    meclizine (ANTIVERT) 12.5 MG tablet, Take 1 tablet (12.5 mg total) by mouth 3 (three) times daily as needed for dizziness., Disp: 180 tablet, Rfl: 1   meloxicam (MOBIC) 15 MG tablet, Take 15 mg by mouth daily., Disp: , Rfl:    Multiple Vitamin (MULTIVITAMIN WITH MINERALS) TABS tablet, Take 1 tablet by mouth daily. , Disp: , Rfl:    NARCAN 4 MG/0.1ML LIQD nasal spray kit, Place 1 spray into the nose as directed., Disp: , Rfl: 0   Norgestimate-Ethinyl Estradiol Triphasic (TRI-SPRINTEC) 0.18/0.215/0.25 MG-35 MCG tablet, Take 1 tablet by mouth daily., Disp: 84 tablet, Rfl: 1   ondansetron (ZOFRAN ODT) 4 MG disintegrating tablet, 4mg  ODT q4 hours prn nausea/vomit, Disp: 10 tablet, Rfl: 0   ondansetron (ZOFRAN) 4 MG tablet, Take 4 mg by mouth as needed for nausea. , Disp: , Rfl:    OneTouch Delica Lancets 33G MISC, Use as directed to check 1 time per day dx: e11.65, Disp: 100 each, Rfl: 3   ONETOUCH VERIO test strip, Use as directed to check 1 time per day dx: e11.65, Disp: 100 each, Rfl: 3   PAIN MANAGEMENT INTRATHECAL, IT, PUMP, 1 each by Intrathecal route continuous. Intrathecal (IT) medication: fentanyl, clonidine, bupivicaine  Fentanyl 250mg /bupivicaine150mg /clonidine2mg   51.97mg  per day, 2mg  per day, 1.25mcg 15.51ml/qmin, Disp: , Rfl:    polyethylene glycol powder (GLYCOLAX/MIRALAX) 17 GM/SCOOP powder, MIX 17 GRAMS IN 8 OUNCES OF LIQUID AND DRINK DAILY FOR BOWELS, Disp: 238 g, Rfl: 1   promethazine (PHENERGAN) 25 MG tablet, Take 1  tablet (25 mg total) by mouth every 6 (six) hours as needed for nausea or vomiting. (Patient not taking: Reported on 09/19/2021), Disp: 90 tablet, Rfl: 0   QUEtiapine (SEROQUEL XR) 400 MG 24 hr tablet, Take 400 mg by mouth 2 (two) times daily., Disp: , Rfl:    QUEtiapine (SEROQUEL) 300 MG tablet, Take 300 mg by mouth at bedtime., Disp: , Rfl:    sitaGLIPtin-metformin (JANUMET) 50-1000 MG tablet, Take 1 tablet by mouth daily., Disp: 90 tablet, Rfl: 1   tiZANidine (ZANAFLEX) 4 MG tablet, Take 4 mg by mouth every 4 (four) hours., Disp: , Rfl:    traZODone (DESYREL) 100 MG tablet, 5 qhs prescribed by psychiatry (Patient taking differently: Take 100-200 mg by mouth at bedtime. 5 qhs prescribed by psychiatry), Disp: 30 tablet, Rfl: 0   triamcinolone cream (KENALOG) 0.1 %, Apply 1 application topically daily as needed (for itching)., Disp: 30 g, Rfl: 2   TRINTELLIX 20 MG TABS tablet, Take 20 mg by mouth daily., Disp: , Rfl:    VRAYLAR 6 MG CAPS, Take 6 mg by mouth daily. (Patient not taking: Reported on 09/19/2021), Disp: , Rfl:    Allergies  Allergen Reactions   Lisinopril Hives    Other reaction(s): anaphylaxis/angioedema   Latex Hives   Morphine And Related     Itching, skin get hot.    Adhesive [Tape] Rash     Review of Systems  Constitutional:  Positive for malaise/fatigue. Negative for fatigue.  Respiratory: Negative.    Cardiovascular: Negative.   Gastrointestinal: Negative.   Neurological: Negative.     Today's Vitals   09/19/21 1518  BP: (!) 110/58  Pulse: 81  Temp: 97.8 F (36.6 C)  TempSrc: Oral  Weight: 135 lb (61.2 kg)  Height: 5\' 4"  (1.626 m)   Body mass index is 23.17 kg/m.   Objective:  Physical Exam Constitutional:      General: She is not in acute distress.    Appearance: Normal appearance. She is normal weight.  Cardiovascular:     Rate and Rhythm: Normal rate and regular rhythm.     Pulses: Normal pulses.     Heart sounds: Normal heart sounds. No murmur  heard. Pulmonary:     Effort: Pulmonary effort is normal. No respiratory distress.     Breath sounds: Normal breath sounds. No wheezing.  Abdominal:     General: Abdomen is flat. Bowel sounds are normal. There is no distension.     Palpations: Abdomen is soft. There is no mass.     Tenderness: There is abdominal tenderness (generalized).  Musculoskeletal:        General: Normal range of motion.     Comments: Using cane  Skin:    General: Skin is warm and dry.     Capillary Refill: Capillary refill takes less than 2 seconds.  Neurological:     General: No focal deficit present.     Mental Status: She is alert and oriented to person, place, and time.     Cranial Nerves: No cranial nerve deficit.     Motor: No weakness.  Psychiatric:        Mood and Affect: Mood normal.        Behavior: Behavior normal.        Thought Content: Thought content normal.        Judgment: Judgment normal.        Assessment And Plan:     1. Type 2 diabetes mellitus without complication, without long-term current use of insulin (HCC) Comments: Blood sugar has been stable with some lows down to 30, decreased her Janumet to once a day, will check HgbA1c and her blood sugar was 93 today in office. She is advised to take Gvoke if her blood sugar decreases to less than 60.  - BMP8+EGFR - Hemoglobin A1c - sitaGLIPtin-metformin (JANUMET) 50-1000 MG tablet; Take 1 tablet by mouth daily.  Dispense: 90 tablet; Refill: 1 - POCT Glucose (Device for Home Use)  2. Essential hypertension Comments: Blood pressure is controlled, continue medications and stay hydrated with water - BMP8+EGFR  3. Hashimoto's disease Comments: I do not see she has been on any medications - TSH - T4 - T3, free  4. Mixed hyperlipidemia Comments: Stable, continue statin, tolerating well.  - Lipid panel  5. Other long term (current) drug therapy - CBC no Diff  6. Nausea and vomiting, unspecified vomiting type Comments: Continue as  needed Zofran and follow up with Dr. Elnoria Howard, will check labs for concerns of gallbladder - H Pylori, IGM, IGG, IGA AB - Lipase - Amylase  7. Generalized abdominal pain Comments: Tenderness to generalized area of abdomen - Lipase - Amylase    Patient was given opportunity to ask questions. Patient verbalized understanding of the plan and was able to repeat key elements of the plan. All questions were answered to their satisfaction.  [name], FNP   I, [name], FNP, have reviewed all documentation for this visit. The documentation on 09/19/21 for the exam, diagnosis, procedures, and orders are all accurate and complete.   IF YOU HAVE BEEN REFERRED TO A SPECIALIST, IT MAY TAKE 1-2 WEEKS TO SCHEDULE/PROCESS THE REFERRAL. IF YOU HAVE NOT HEARD FROM US/SPECIALIST IN TWO WEEKS, PLEASE GIVE [place] A CALL AT [phone] X [phone].   THE PATIENT IS ENCOURAGED TO PRACTICE SOCIAL DISTANCING DUE TO THE  COVID-19 PANDEMIC.

## 2021-09-20 ENCOUNTER — Telehealth: Payer: Self-pay

## 2021-09-20 NOTE — Telephone Encounter (Signed)
? ?  Telephone encounter was:  Successful.  ?09/20/2021 ?Name: Linda Mercado MRN: [id number] DOB: 08-15-1965 ? ?[name] is a 56 y.o. year old female who is a primary care patient of [name], [place] . The community resource team was consulted for assistance with Transportation Needs  ? ?Care guide performed the following interventions: Patient provided with information about care guide support team and interviewed to confirm resource needs.Patient stated she is having difficulty geting transportation for appointments I provided the [facility] resources over the phone as well  as mailing other resources to her ? ?Follow Up Plan:  Care guide will follow up with patient by phone over the next week ? ? ? ?Larena Sox ?Care Guide, Embedded Care Coordination ?, Care Management  ?[phone] ?[address], [place], [place] [address] ?Phone: [phone] ?Email: [email or web address] ? ?  ?

## 2021-09-21 ENCOUNTER — Ambulatory Visit (INDEPENDENT_AMBULATORY_CARE_PROVIDER_SITE_OTHER): Payer: Medicaid Other

## 2021-09-21 ENCOUNTER — Telehealth: Payer: MEDICARE

## 2021-09-21 DIAGNOSIS — I10 Essential (primary) hypertension: Secondary | ICD-10-CM

## 2021-09-21 DIAGNOSIS — M5416 Radiculopathy, lumbar region: Secondary | ICD-10-CM

## 2021-09-21 DIAGNOSIS — E119 Type 2 diabetes mellitus without complications: Secondary | ICD-10-CM

## 2021-09-21 DIAGNOSIS — E063 Autoimmune thyroiditis: Secondary | ICD-10-CM

## 2021-09-21 DIAGNOSIS — K219 Gastro-esophageal reflux disease without esophagitis: Secondary | ICD-10-CM

## 2021-09-21 DIAGNOSIS — G894 Chronic pain syndrome: Secondary | ICD-10-CM

## 2021-09-21 LAB — BMP8+EGFR
BUN/Creatinine Ratio: 11 (ref 9–23)
BUN: 11 mg/dL (ref 6–24)
CO2: 24 mmol/L (ref 20–29)
Calcium: 9.8 mg/dL (ref 8.7–10.2)
Chloride: 100 mmol/L (ref 96–106)
Creatinine, Ser: 0.97 mg/dL (ref 0.57–1.00)
Glucose: 74 mg/dL (ref 70–99)
Potassium: 4.4 mmol/L (ref 3.5–5.2)
Sodium: 140 mmol/L (ref 134–144)
eGFR: 69 mL/min/{1.73_m2} (ref >59)

## 2021-09-21 LAB — CBC
Hematocrit: 39.5 % (ref 34.0–46.6)
Hemoglobin: 12.4 g/dL (ref 11.1–15.9)
MCH: 26.3 pg — ABNORMAL LOW (ref 26.6–33.0)
MCHC: 31.4 g/dL — ABNORMAL LOW (ref 31.5–35.7)
MCV: 84 fL (ref 79–97)
Platelets: 396 10*3/uL (ref 150–450)
RBC: 4.72 x10E6/uL (ref 3.77–5.28)
RDW: 13.1 % (ref 11.7–15.4)
WBC: 7.5 10*3/uL (ref 3.4–10.8)

## 2021-09-21 LAB — LIPID PANEL
Chol/HDL Ratio: 2.3 ratio (ref 0.0–4.4)
Cholesterol, Total: 102 mg/dL (ref 100–199)
HDL: 44 mg/dL (ref >39)
LDL Chol Calc (NIH): 43 mg/dL (ref 0–99)
Triglycerides: 74 mg/dL (ref 0–149)
VLDL Cholesterol Cal: 15 mg/dL (ref 5–40)

## 2021-09-21 LAB — H PYLORI, IGM, IGG, IGA AB
H pylori, IgM Abs: <9 U (ref 0.0–8.9)
H. pylori, IgA Abs: 15.9 U — ABNORMAL HIGH (ref 0.0–8.9)
H. pylori, IgG AbS: 0.3 {index_val} (ref 0.00–0.79)

## 2021-09-21 LAB — AMYLASE: Amylase: 109 U/L (ref 31–110)

## 2021-09-21 LAB — T3, FREE: T3, Free: 2.6 pg/mL (ref 2.0–4.4)

## 2021-09-21 LAB — LIPASE: Lipase: 34 U/L (ref 14–72)

## 2021-09-21 LAB — T4: T4, Total: 8.2 ug/dL (ref 4.5–12.0)

## 2021-09-21 LAB — TSH: TSH: 2.87 u[IU]/mL (ref 0.450–4.500)

## 2021-09-21 LAB — HEMOGLOBIN A1C
Est. average glucose Bld gHb Est-mCnc: 103 mg/dL
Hgb A1c MFr Bld: 5.2 % (ref 4.8–5.6)

## 2021-09-21 NOTE — Chronic Care Management (AMB) (Signed)
Chronic Care Management   CCM RN Visit Note  09/21/2021 Name: Linda Mercado MRN: [id number] DOB: 10-22-1965  Subjective: Linda Mercado is a 56 y.o. year old female who is a primary care patient of [name], FNP. The care management team was consulted for assistance with disease management and care coordination needs.    Engaged with patient by telephone for follow up visit in response to provider referral for case management and/or care coordination services.   Consent to Services:  The patient was given information about Chronic Care Management services, agreed to services, and gave verbal consent prior to initiation of services.  Please see initial visit note for detailed documentation.   Patient agreed to services and verbal consent obtained.   Assessment: Review of patient past medical history, allergies, medications, health status, including review of consultants reports, laboratory and other test data, was performed as part of comprehensive evaluation and provision of chronic care management services.   SDOH (Social Determinants of Health) assessments and interventions performed:  yes, no acute changes   CCM Care Plan  Allergies  Allergen Reactions   Lisinopril Hives    Other reaction(s): anaphylaxis/angioedema   Latex Hives   Morphine And Related     Itching, skin get hot.    Adhesive [Tape] Rash    Outpatient Encounter Medications as of 09/21/2021  Medication Sig Note   albuterol (VENTOLIN HFA) 108 (90 Base) MCG/ACT inhaler Inhale 2 puffs into the lungs every 6 (six) hours as needed for wheezing or shortness of breath.    Alcohol Swabs PADS Use as directed to check blood sugars    AMITIZA 24 MCG capsule Take 24 mcg by mouth 2 (two) times daily.     atenolol (TENORMIN) 100 MG tablet TAKE 1 TABLET BY MOUTH EVERY DAY    atorvastatin (LIPITOR) 10 MG tablet TAKE 1 TABLET(10 MG) BY MOUTH AT BEDTIME    BD PEN NEEDLE NANO 2ND GEN 32G X 4 MM MISC USE AS DIRECTED WITH [facility]  DAILY    Blood Glucose Monitoring Suppl (ONETOUCH VERIO) w/Device KIT Use as directed to check 1 time per day dx: e11.65    calcium gluconate 500 MG tablet Take 1 tablet (500 mg total) by mouth 3 (three) times daily.    CAPLYTA 42 MG CAPS Take 42 mg by mouth at bedtime.     Carbamazepine (EQUETRO) 300 MG CP12 Take by mouth.    clonazePAM (KLONOPIN) 1 MG tablet Take 1 mg by mouth 3 (three) times daily.    Cyanocobalamin (B-12) 500 MCG SUBL One SL QD (Patient not taking: Reported on 09/19/2021)    desvenlafaxine (PRISTIQ) 50 MG 24 hr tablet Take by mouth.    dexlansoprazole (DEXILANT) 60 MG capsule Take 1 capsule (60 mg total) by mouth daily. Patient will need appt for further refills    diclofenac Sodium (VOLTAREN) 1 % GEL APPLY 4 GRAMS TO THE AFFECTED AREA FOUR TIMES DAILY    docusate sodium (COLACE) 50 MG capsule Take 50 mg by mouth daily.     FLOVENT HFA 110 MCG/ACT inhaler INHALE 2 PUFFS INTO THE LUNGS EVERY 12 HOURS    Glucagon (GVOKE HYPOPEN 2-PACK) 0.5 MG/0.1ML SOAJ Inject 1 each into the skin as needed.    glucose blood (ACCU-CHEK GUIDE) test strip USE TO TEST TWICE DAILY AS DIRECTED    hydrochlorothiazide (HYDRODIURIL) 12.5 MG tablet TAKE 1 TABLET(12.5 MG) BY MOUTH DAILY    HYDROmorphone (DILAUDID) 4 MG tablet Take 4 mg by mouth 4 (  four) times daily as needed for severe pain.    hydrOXYzine (ATARAX/VISTARIL) 25 MG tablet Take 50 mg by mouth in the morning, at noon, in the evening, and at bedtime.    lidocaine (XYLOCAINE) 2 % solution Use as directed 15 mLs in the mouth or throat 3 (three) times a week.     Lidocaine 4 % PTCH Place 1 patch onto the skin in the morning and at bedtime.    LORazepam (ATIVAN) 2 MG tablet Take 2 mg by mouth 4 (four) times daily as needed for anxiety.     losartan (COZAAR) 25 MG tablet Take 1 tablet (25 mg total) by mouth daily.    lubiprostone (AMITIZA) 24 MCG capsule Take by mouth.    meclizine (ANTIVERT) 12.5 MG tablet Take 1 tablet (12.5 mg total) by mouth 3  (three) times daily as needed for dizziness.    meloxicam (MOBIC) 15 MG tablet Take 15 mg by mouth daily.    Multiple Vitamin (MULTIVITAMIN WITH MINERALS) TABS tablet Take 1 tablet by mouth daily.     NARCAN 4 MG/0.1ML LIQD nasal spray kit Place 1 spray into the nose as directed.    Norgestimate-Ethinyl Estradiol Triphasic (TRI-SPRINTEC) 0.18/0.215/0.25 MG-35 MCG tablet Take 1 tablet by mouth daily.    ondansetron (ZOFRAN ODT) 4 MG disintegrating tablet 4mg  ODT q4 hours prn nausea/vomit    ondansetron (ZOFRAN) 4 MG tablet Take 4 mg by mouth as needed for nausea.     OneTouch Delica Lancets 33G MISC Use as directed to check 1 time per day dx: e11.65    ONETOUCH VERIO test strip Use as directed to check 1 time per day dx: e11.65    PAIN MANAGEMENT INTRATHECAL, IT, PUMP 1 each by Intrathecal route continuous. Intrathecal (IT) medication: fentanyl, clonidine, bupivicaine  Fentanyl 250mg /bupivicaine150mg /clonidine2mg   51.97mg  per day, 2mg  per day, 1.12mcg 15.63ml/qmin    polyethylene glycol powder (GLYCOLAX/MIRALAX) 17 GM/SCOOP powder MIX 17 GRAMS IN 8 OUNCES OF LIQUID AND DRINK DAILY FOR BOWELS    promethazine (PHENERGAN) 25 MG tablet Take 1 tablet (25 mg total) by mouth every 6 (six) hours as needed for nausea or vomiting. (Patient not taking: Reported on 09/19/2021)    QUEtiapine (SEROQUEL XR) 400 MG 24 hr tablet Take 400 mg by mouth 2 (two) times daily.    QUEtiapine (SEROQUEL) 300 MG tablet Take 300 mg by mouth at bedtime.    sitaGLIPtin-metformin (JANUMET) 50-1000 MG tablet Take 1 tablet by mouth daily.    tiZANidine (ZANAFLEX) 4 MG tablet Take 4 mg by mouth every 4 (four) hours.    traZODone (DESYREL) 100 MG tablet 5 qhs prescribed by psychiatry (Patient taking differently: Take 100-200 mg by mouth at bedtime. 5 qhs prescribed by psychiatry)    triamcinolone cream (KENALOG) 0.1 % Apply 1 application topically daily as needed (for itching).    TRINTELLIX 20 MG TABS tablet Take 20 mg by mouth  daily.    VRAYLAR 6 MG CAPS Take 6 mg by mouth daily. (Patient not taking: Reported on 09/19/2021) 06/21/2020: D/C by psychiatrist Joylene Grapes per patient    No facility-administered encounter medications on file as of 09/21/2021.    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   Hypokalemia 03/18/2018   Intractable back pain 01/03/2018   HTN (hypertension) 01/03/2018   Type 2 diabetes mellitus without complication (HCC) 01/03/2018   Vitamin D deficiency 07/11/2017   Postmenopausal 04/24/2017   Microalbuminuria 01/14/2017   Dyspareunia in female 12/10/2016   Endometriosis determined by laparoscopy 12/10/2016   History of exploratory laparotomy 12/10/2016   Pelvic pain 12/10/2016   Bipolar 1 disorder (HCC) 09/19/2016   Cyst of right ovary 09/19/2016   Severe episode of recurrent major depressive disorder, with psychotic features (HCC) 09/19/2016   Schizoaffective disorder, bipolar type (HCC) 09/19/2016   Chronic pain 03/05/2016   Non morbid obesity due to excess calories 03/05/2016   S/P lumbar fusion 03/05/2016    Conditions to be addressed/monitored: Type 2 diabetes Mellitus, Essential hypertension, Chronic pain syndrome, Hashimoto's disease, Lumbar radiculopathy, GERD   Care Plan : RN Care Manager Plan of Care  Updates made by Riley Churches, RN since 09/21/2021 12:00 AM     Problem: No plan established for care established for management of chronic disease states (Type 2 diabetes Mellitus,  Essential hypertension, Chronic pain syndrome, Hashimoto's disease, Lumbar radiculopathy, GERD)   Priority: High     Long-Range Goal: Development of plan of care for chronic disease management for (Type 2 diabetes Mellitus, Essential hypertension, Chronic pain syndrome, Hashimoto's disease, Lumbar radiculopathy, GERD)   Start Date: 06/28/2021  Expected End Date: 06/28/2022  Recent Progress: On track  Priority: High  Note:   Current Barriers:  Knowledge Deficits related to plan of care for management of Type 2 diabetes Mellitus, Essential hypertension, Chronic pain syndrome, Hashimoto's disease, Lumbar radiculopathy, GERD   Chronic Disease Management support and education needs related to Type 2 diabetes Mellitus, Essential hypertension, Chronic pain syndrome, Hashimoto's disease, Lumbar radiculopathy, GERD   Lacks caregiver support Transportation barriers  RNCM Clinical Goal(s):  Patient will verbalize basic understanding of  Type 2 diabetes Mellitus, Essential hypertension, Chronic pain syndrome, Hashimoto's disease, Lumbar radiculopathy, GERD  disease process and self health management plan as evidenced by patient will report having no disease exacerbations related to her chronic disease states listed above take all medications exactly as prescribed and will call provider for medication related questions as evidenced by patient will report having no missed doses of her prescribed medications  demonstrate Improved health management independence as evidenced by patient will report 100% adherence to following her prescribed treatment plan continue to work with RN Care Manager to address care management and care coordination needs related to  Type 2 diabetes Mellitus, Essential hypertension, Chronic pain syndrome, Hashimoto's disease, Lumbar radiculopathy, GERD  as evidenced by adherence to CM Team Scheduled appointments through collaboration with RN Care manager, provider, and care team.    Interventions: 1:1 collaboration with primary care provider regarding development and update of comprehensive plan of care as evidenced by provider attestation and co-signature Inter-disciplinary care team collaboration (see longitudinal plan of care) Evaluation of current treatment plan related to  self management and patient's adherence to plan as established by provider  Diabetes Interventions:  (Status:   pending labs ) Long Term Goal Assessed patient's understanding of A1c goal:  <5.7% Provided education to patient about basic DM disease process Reviewed medications with patient and discussed importance of medication adherence Provided patient with written educational materials  related to hypo and hyperglycemia and importance of correct treatment Review of patient status, including review of consultants reports, relevant laboratory and other test results, and medications completed Assessed social determinant of health barriers Discussed plans with patient for ongoing care management follow up and provided patient with direct contact information for care management team Lab Results  Component Value Date   HGBA1C 5.9 (H) 02/07/2021     Pain Interventions:  (Status:  Goal on track:  Yes.) Long Term Goal Pain assessment performed Medications reviewed Reviewed provider established plan for pain management Discussed importance of adherence to all scheduled medical appointments Counseled on the importance of reporting any/all new or changed pain symptoms or management strategies to pain management provider Advised patient to report to care team affect of pain on daily activities Discussed use of relaxation techniques and/or diversional activities to assist with pain reduction (distraction, imagery, relaxation, massage, acupressure, TENS, heat, and cold application Reviewed with patient prescribed pharmacological and nonpharmacological pain relief strategies Screening for signs and  symptoms of 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  Assessed social determinant of health barriers Discussed plans with patient for ongoing care management follow up and provided patient with direct contact information for care management team  Falls Interventions:  (Status:  Condition stable.  Not addressed this visit.) Long Term Goal Determined patient experienced a recent fall outside of her home when trying to get into her car, she was transported to the ED but eloped approximately 12 hours after arriving due to not being seen by a physician Advised patient of importance of notifying provider of falls Assessed for fall related injury, patient reports her back pain has worsened since having the fall Determined patient followed up with her pain doctor following the fall and received a refill to her pain pump Assessed for DME adherence, patient is using her walker and power W/C at all times, she denies having other DME needs at this time  Reviewed scheduled/upcoming provider appointments including: next PCP follow up appointment scheduled for 09/19/21 :40 PM  Determined patient continues to need assistance with transportation, sent in basket message to [facility] requesting assistance  Nausea and Vomiting:  (Status:  New goal.)  Long Term Goal Completed successful outbound call with patient, rescheduled for more in depth follow up with patient following GI follow up can completion of recent labs Evaluation of current treatment plan related to GERD, self-management and patient's adherence to plan as established by provider Determined patient continues to experience vomiting for unknown reason at least once daily if not more often Determined patient discussed her symptoms with PCP at recent visit, she will follow up with Dr. Elnoria Howard, GI on 10/04/21 to have her esophagus dilated and further evaluation of her upper GI Reviewed and discussed labs for H. Pylori, Amylase and Lipase are pending   Reinforced the importance of patient drinking plenty of fluids to stay hydrated  Reviewed medications with patient and discussed importance of medication adherence and determined patient is taking Zofran with good effectiveness  Discussed plans with patient for ongoing care management follow up and provided patient with direct contact information for care management team   Patient Goals/Self-Care Activities: Take all medications as prescribed Attend all scheduled provider appointments Call pharmacy for medication refills 3-7 days in advance of running out of medications Call provider office for new concerns or questions  drink 6 to 8 glasses of water each day fill half of plate with vegetables manage portion size  Follow Up Plan:  Telephone follow up appointment with care management team member scheduled for:  10/09/21     Delsa Sale, RN, BSN, CCM Care Management Coordinator [facility] Care Management/[facility]  Direct Phone: [phone]

## 2021-09-21 NOTE — Patient Instructions (Signed)
Visit Information ? ?Thank you for taking time to visit with me today. Please don't hesitate to contact me if I can be of assistance to you before our next scheduled telephone appointment. ? ?Following are the goals we discussed today:  ?(Copy and paste patient goals from clinical care plan here) ? ?Our next appointment is by telephone on 10/09/21 at 12 PM ? ?Please call the care guide team at [phone] if you need to cancel or reschedule your appointment.  ? ?If you are experiencing a Mental Health or [place] or need someone to talk to, please call 1-800-273-TALK (toll free, 24 hour hotline)  ? ?Patient verbalizes understanding of instructions and care plan provided today and agrees to view in [place]. Active MyChart status confirmed with patient.   ? ?Barb Merino, RN, BSN, CCM ?Care Management Coordinator ?[place] [facility]  ?Direct Phone: [phone] ? ? ?

## 2021-09-27 ENCOUNTER — Other Ambulatory Visit: Payer: Self-pay | Admitting: Nurse Practitioner

## 2021-09-27 DIAGNOSIS — R768 Other specified abnormal immunological findings in serum: Secondary | ICD-10-CM

## 2021-09-27 MED ORDER — METRONIDAZOLE 500 MG PO TABS: 500.0000 mg | ORAL_TABLET | Freq: Three times a day (TID) | ORAL | 0 refills | Status: AC

## 2021-09-27 MED ORDER — AMOXICILLIN 875 MG PO TABS: 875.0000 mg | ORAL_TABLET | Freq: Two times a day (BID) | ORAL | 0 refills | Status: DC

## 2021-10-09 ENCOUNTER — Telehealth: Payer: MEDICARE

## 2021-10-09 ENCOUNTER — Telehealth: Payer: Self-pay

## 2021-10-09 NOTE — Telephone Encounter (Signed)
?  Care Management  ? ?Follow Up Note ? ? ?10/09/2021 ?Name: Linda Mercado MRN: [id number] DOB: [birth year] ? ? ?Referred by: [name], FNP ?Reason for referral : Chronic Care Management (RN CM Follow up call ) ? ? ?An unsuccessful telephone outreach was attempted today. The patient was referred to the case management team for assistance with care management and care coordination.  ? ?Follow Up Plan: A HIPPA compliant phone message was left for the patient providing contact information and requesting a return call.  ? ?Barb Merino, RN, BSN, CCM ?Care Management Coordinator ?[place] [facility]  ?Direct Phone: [phone] ? ? ?

## 2021-10-10 ENCOUNTER — Other Ambulatory Visit: Payer: Self-pay | Admitting: Nurse Practitioner

## 2021-10-10 MED ORDER — FLUCONAZOLE 100 MG PO TABS: 100.0000 mg | ORAL_TABLET | Freq: Every day | ORAL | 0 refills | Status: DC

## 2021-10-10 MED ORDER — ONDANSETRON 4 MG PO TBDP: ORAL_TABLET | ORAL | 0 refills | Status: DC

## 2021-10-10 MED ORDER — SUCRALFATE 1 GM/10ML PO SUSP: 1.0000 g | Freq: Two times a day (BID) | ORAL | 1 refills | Status: DC

## 2021-10-11 ENCOUNTER — Other Ambulatory Visit: Payer: Self-pay

## 2021-10-11 ENCOUNTER — Encounter (HOSPITAL_COMMUNITY): Payer: Self-pay | Admitting: Emergency Medicine

## 2021-10-11 ENCOUNTER — Emergency Department (HOSPITAL_COMMUNITY): Payer: Medicare Other

## 2021-10-11 ENCOUNTER — Telehealth: Payer: Self-pay

## 2021-10-11 ENCOUNTER — Emergency Department (HOSPITAL_COMMUNITY)
Admission: EM | Admit: 2021-10-11 | Discharge: 2021-10-11 | Disposition: A | Discharge status: Home / Self Care | Payer: Medicare Other | Attending: Emergency Medicine | Admitting: Emergency Medicine

## 2021-10-11 DIAGNOSIS — R1084 Generalized abdominal pain: Secondary | ICD-10-CM | POA: Insufficient documentation

## 2021-10-11 DIAGNOSIS — E119 Type 2 diabetes mellitus without complications: Secondary | ICD-10-CM | POA: Insufficient documentation

## 2021-10-11 DIAGNOSIS — Z79899 Other long term (current) drug therapy: Secondary | ICD-10-CM | POA: Diagnosis not present

## 2021-10-11 DIAGNOSIS — Z91040 Latex allergy status: Secondary | ICD-10-CM | POA: Insufficient documentation

## 2021-10-11 DIAGNOSIS — G8929 Other chronic pain: Secondary | ICD-10-CM | POA: Diagnosis not present

## 2021-10-11 DIAGNOSIS — R197 Diarrhea, unspecified: Secondary | ICD-10-CM | POA: Diagnosis not present

## 2021-10-11 DIAGNOSIS — R109 Unspecified abdominal pain: Secondary | ICD-10-CM | POA: Diagnosis not present

## 2021-10-11 DIAGNOSIS — Z7984 Long term (current) use of oral hypoglycemic drugs: Secondary | ICD-10-CM | POA: Insufficient documentation

## 2021-10-11 DIAGNOSIS — I10 Essential (primary) hypertension: Secondary | ICD-10-CM | POA: Insufficient documentation

## 2021-10-11 DIAGNOSIS — R112 Nausea with vomiting, unspecified: Secondary | ICD-10-CM | POA: Diagnosis not present

## 2021-10-11 DIAGNOSIS — K219 Gastro-esophageal reflux disease without esophagitis: Secondary | ICD-10-CM | POA: Insufficient documentation

## 2021-10-11 LAB — URINALYSIS, ROUTINE W REFLEX MICROSCOPIC
Bacteria, UA: NONE SEEN
Bilirubin Urine: NEGATIVE
Glucose, UA: NEGATIVE mg/dL
Ketones, ur: NEGATIVE mg/dL
Nitrite: NEGATIVE
Protein, ur: NEGATIVE mg/dL
Specific Gravity, Urine: 1.011 (ref 1.005–1.030)
pH: 6 (ref 5.0–8.0)

## 2021-10-11 LAB — CBC WITH DIFFERENTIAL/PLATELET
Abs Immature Granulocytes: 0.04 10*3/uL (ref 0.00–0.07)
Basophils Absolute: 0 10*3/uL (ref 0.0–0.1)
Basophils Relative: 0 %
Eosinophils Absolute: 0 10*3/uL (ref 0.0–0.5)
Eosinophils Relative: 0 %
HCT: 39.5 % (ref 36.0–46.0)
Hemoglobin: 13.4 g/dL (ref 12.0–15.0)
Immature Granulocytes: 0 %
Lymphocytes Relative: 22 %
Lymphs Abs: 2.8 10*3/uL (ref 0.7–4.0)
MCH: 27.3 pg (ref 26.0–34.0)
MCHC: 33.9 g/dL (ref 30.0–36.0)
MCV: 80.6 fL (ref 80.0–100.0)
Monocytes Absolute: 0.6 10*3/uL (ref 0.1–1.0)
Monocytes Relative: 5 %
Neutro Abs: 9.3 10*3/uL — ABNORMAL HIGH (ref 1.7–7.7)
Neutrophils Relative %: 73 %
Platelets: 438 10*3/uL — ABNORMAL HIGH (ref 150–400)
RBC: 4.9 MIL/uL (ref 3.87–5.11)
RDW: 14.5 % (ref 11.5–15.5)
WBC: 12.8 10*3/uL — ABNORMAL HIGH (ref 4.0–10.5)
nRBC: 0 % (ref 0.0–0.2)

## 2021-10-11 LAB — RAPID URINE DRUG SCREEN, HOSP PERFORMED
Amphetamines: NOT DETECTED
Barbiturates: NOT DETECTED
Benzodiazepines: POSITIVE — AB
Cocaine: NOT DETECTED
Opiates: NOT DETECTED
Tetrahydrocannabinol: NOT DETECTED

## 2021-10-11 LAB — COMPREHENSIVE METABOLIC PANEL
ALT: 11 U/L (ref 0–44)
AST: 15 U/L (ref 15–41)
Albumin: 4.3 g/dL (ref 3.5–5.0)
Alkaline Phosphatase: 62 U/L (ref 38–126)
Anion gap: 11 (ref 5–15)
BUN: 15 mg/dL (ref 6–20)
CO2: 22 mmol/L (ref 22–32)
Calcium: 9.8 mg/dL (ref 8.9–10.3)
Chloride: 103 mmol/L (ref 98–111)
Creatinine, Ser: 0.81 mg/dL (ref 0.44–1.00)
GFR, Estimated: >60 mL/min (ref >60)
Glucose, Bld: 143 mg/dL — ABNORMAL HIGH (ref 70–99)
Potassium: 3.3 mmol/L — ABNORMAL LOW (ref 3.5–5.1)
Sodium: 136 mmol/L (ref 135–145)
Total Bilirubin: 0.4 mg/dL (ref 0.3–1.2)
Total Protein: 7.5 g/dL (ref 6.5–8.1)

## 2021-10-11 LAB — LIPASE, BLOOD: Lipase: 54 U/L — ABNORMAL HIGH (ref 11–51)

## 2021-10-11 LAB — PREGNANCY, URINE: Preg Test, Ur: NEGATIVE

## 2021-10-11 MED ORDER — METOCLOPRAMIDE HCL 5 MG/ML IJ SOLN
10.0000 mg | Freq: Once | INTRAMUSCULAR | Status: AC | PRN
  Administered 2021-10-11: 10 mg via INTRAVENOUS
  Filled 2021-10-11: qty 2

## 2021-10-11 MED ORDER — IOHEXOL 300 MG/ML  SOLN
100.0000 mL | Freq: Once | INTRAMUSCULAR | Status: AC | PRN
  Administered 2021-10-11: 100 mL via INTRAVENOUS

## 2021-10-11 MED ORDER — HYDROMORPHONE HCL 2 MG/ML IJ SOLN
2.0000 mg | Freq: Once | INTRAMUSCULAR | Status: AC
  Administered 2021-10-11: 2 mg via INTRAVENOUS
  Filled 2021-10-11: qty 1

## 2021-10-11 MED ORDER — DIPHENHYDRAMINE HCL 50 MG/ML IJ SOLN
25.0000 mg | Freq: Once | INTRAMUSCULAR | Status: AC | PRN
  Administered 2021-10-11: 25 mg via INTRAVENOUS
  Filled 2021-10-11: qty 1

## 2021-10-11 MED ORDER — SODIUM CHLORIDE 0.9 % IV BOLUS
1000.0000 mL | Freq: Once | INTRAVENOUS | Status: AC
Start: 2021-10-11 — End: 2021-10-11
  Administered 2021-10-11: 1000 mL via INTRAVENOUS

## 2021-10-11 MED ORDER — PANTOPRAZOLE SODIUM 40 MG IV SOLR
40.0000 mg | Freq: Once | INTRAVENOUS | Status: AC
  Administered 2021-10-11: 40 mg via INTRAVENOUS
  Filled 2021-10-11: qty 10

## 2021-10-11 MED ORDER — ONDANSETRON HCL 4 MG/2ML IJ SOLN
4.0000 mg | Freq: Once | INTRAMUSCULAR | Status: AC
  Administered 2021-10-11: 4 mg via INTRAVENOUS
  Filled 2021-10-11: qty 2

## 2021-10-11 MED ORDER — ONDANSETRON 8 MG PO TBDP: 8.0000 mg | ORAL_TABLET | Freq: Three times a day (TID) | ORAL | 0 refills | Status: DC | PRN

## 2021-10-11 MED ORDER — SODIUM CHLORIDE (PF) 0.9 % IJ SOLN
INTRAMUSCULAR | Status: AC
  Filled 2021-10-11: qty 50

## 2021-10-11 NOTE — ED Provider Notes (Signed)
[facility]ER[facility] DEPT Provider Note: [name], MD, FACEP  CSN: [id number] MRN: [id number] ARRIVAL: 10/11/21 at 0015 ROOM: WA19/WA19   CHIEF COMPLAINT  Abdominal Pain   HISTORY OF PRESENT ILLNESS  10/11/21 12:29 AM Linda Mercado is a 56 y.o. female with chronic back and neck pain who is on an intrathecal infusion of fentanyl, clonidine and bupivacaine who also takes Dilaudid orally for breakthrough pain.  She has been having episodes of generalized abdominal pain, nausea, vomiting and sometimes diarrhea for about the past 4 months.  She is here with a severe episode that began yesterday.  She rates her pain as a 10 out of 10 and describes it as a burning pain.  It is worse with palpation or movement.  She has not been able to keep anything on her stomach.  She was recently diagnosed with Helicobacter pylori and was started on multiple antibiotics 3 days ago.  She was not able to keep them down yesterday.  She has also not been able to keep down her Dilaudid and her infusion pump is not adequately controlling her pain.   Past Medical History:  Diagnosis Date   Acid reflux    Bradycardia    Chronic back pain    Diabetes mellitus without complication (HCC)    "prediabetes", A1C down to 5 - reported on 10/11/21   Hypertension    Hypokalemia     Past Surgical History:  Procedure Laterality Date   APPENDECTOMY     BACK SURGERY     x6   CESAREAN SECTION     x3   ESOPHAGEAL MANOMETRY N/A 08/25/2019   Procedure: ESOPHAGEAL MANOMETRY (EM);  Surgeon: Jeani Hawking, MD;  Location: [facility];  Service: Endoscopy;  Laterality: N/A;   HERNIA REPAIR      Family History  Problem Relation Age of Onset   Kidney disease Mother    Hypertension Mother    Diabetes Father    Hypertension Father    Bipolar disorder Sister    Schizophrenia Sister    Neuropathy Sister    Hypertension Sister    Bipolar disorder Daughter    Post-traumatic stress disorder Daughter    Asthma Daughter     Depression Daughter    Bipolar disorder Daughter    Post-traumatic stress disorder Daughter    Asthma Daughter    Depression Daughter    Asthma Daughter    Depression Daughter    Asthma Daughter    Breast cancer Paternal Grandmother    Bipolar disorder Brother    Bipolar disorder Brother    Schizophrenia Brother    Stroke Neg Hx    Cancer Neg Hx    CAD Neg Hx     Social History   Tobacco Use   Smoking status: Never   Smokeless tobacco: Never  Vaping Use   Vaping Use: Never used  Substance Use Topics   Alcohol use: Not Currently   Drug use: Never    Prior to Admission medications   Medication Sig Start Date End Date Taking? Authorizing Provider  ondansetron (ZOFRAN-ODT) 8 MG disintegrating tablet Take 1 tablet (8 mg total) by mouth every 8 (eight) hours as needed for nausea or vomiting. 8mg  ODT q4 hours prn nausea 10/11/21  Yes Iyona Pehrson, MD  albuterol (VENTOLIN HFA) 108 (90 Base) MCG/ACT inhaler Inhale 2 puffs into the lungs every 6 (six) hours as needed for wheezing or shortness of breath. 01/30/21   [name], FNP  Alcohol Swabs PADS Use as  directed to check blood sugars 01/30/21   [name], FNP  AMITIZA 24 MCG capsule Take 24 mcg by mouth 2 (two) times daily.  10/23/18   [provider]  amoxicillin (AMOXIL) 875 MG tablet Take 1 tablet (875 mg total) by mouth 2 (two) times daily. 09/27/21   [name], FNP  atenolol (TENORMIN) 100 MG tablet TAKE 1 TABLET BY MOUTH EVERY DAY 09/19/21   [name], FNP  atorvastatin (LIPITOR) 10 MG tablet TAKE 1 TABLET(10 MG) BY MOUTH AT BEDTIME 09/19/21   [name], FNP  BD PEN NEEDLE NANO 2ND GEN 32G X 4 MM MISC USE AS DIRECTED WITH TRESIBA DAILY 05/21/21   [name], FNP  Blood Glucose Monitoring Suppl (ONETOUCH VERIO) w/Device KIT Use as directed to check 1 time per day dx: e11.65 02/07/21   [name], FNP  calcium gluconate 500 MG tablet Take 1 tablet (500 mg total) by mouth 3 (three) times daily. 01/30/21    [name], FNP  CAPLYTA 42 MG CAPS Take 42 mg by mouth at bedtime.  03/19/19   [provider]  Carbamazepine (EQUETRO) 300 MG CP12 Take by mouth. 12/20/20   [provider]  clonazePAM (KLONOPIN) 1 MG tablet Take 1 mg by mouth 3 (three) times daily.    [provider]  desvenlafaxine (PRISTIQ) 50 MG 24 hr tablet Take by mouth.    [provider]  dexlansoprazole (DEXILANT) 60 MG capsule Take 1 capsule (60 mg total) by mouth daily. Patient will need appt for further refills 06/25/21   [name], FNP  diclofenac Sodium (VOLTAREN) 1 % GEL APPLY 4 GRAMS TO THE AFFECTED AREA FOUR TIMES DAILY 05/22/20   [name], FNP  docusate sodium (COLACE) 50 MG capsule Take 50 mg by mouth daily.     [provider]  FLOVENT HFA 110 MCG/ACT inhaler INHALE 2 PUFFS INTO THE LUNGS EVERY 12 HOURS 05/21/21   [name], FNP  fluconazole (DIFLUCAN) 100 MG tablet Take 1 tablet (100 mg total) by mouth daily. Take 1 tablet by mouth now repeat in 5 days 10/10/21   [name], FNP  Glucagon (GVOKE HYPOPEN 2-PACK) 0.5 MG/0.1ML SOAJ Inject 1 each into the skin as needed. 01/30/21   [name], FNP  glucose blood (ACCU-CHEK GUIDE) test strip USE TO TEST TWICE DAILY AS DIRECTED 02/08/21   [name], FNP  hydrochlorothiazide (HYDRODIURIL) 12.5 MG tablet TAKE 1 TABLET(12.5 MG) BY MOUTH DAILY 09/19/21   [name], FNP  HYDROmorphone (DILAUDID) 4 MG tablet Take 4 mg by mouth 4 (four) times daily as needed for severe pain.    [provider]  hydrOXYzine (ATARAX/VISTARIL) 25 MG tablet Take 50 mg by mouth in the morning, at noon, in the evening, and at bedtime.    [provider]  lidocaine (XYLOCAINE) 2 % solution Use as directed 15 mLs in the mouth or throat 3 (three) times a week.  04/19/19   [provider]  Lidocaine 4 % PTCH Place 1 patch onto the skin in the morning and at bedtime. 05/22/20   [name], FNP  LORazepam (ATIVAN) 2 MG tablet  Take 2 mg by mouth 4 (four) times daily as needed for anxiety.     [provider]  losartan (COZAAR) 25 MG tablet Take 1 tablet (25 mg total) by mouth daily. 09/19/21 09/19/22  [name], FNP  lubiprostone (AMITIZA) 24 MCG capsule Take by mouth.    [provider]  meclizine (ANTIVERT) 12.5 MG tablet  Take 1 tablet (12.5 mg total) by mouth 3 (three) times daily as needed for dizziness. 09/19/21   [name], FNP  meloxicam (MOBIC) 15 MG tablet Take 15 mg by mouth daily. 08/08/21   [provider]  Multiple Vitamin (MULTIVITAMIN WITH MINERALS) TABS tablet Take 1 tablet by mouth daily.     [provider]  NARCAN 4 MG/0.1ML LIQD nasal spray kit Place 1 spray into the nose as directed. 01/19/18   [provider]  Norgestimate-Ethinyl Estradiol Triphasic (TRI-SPRINTEC) 0.18/0.215/0.25 MG-35 MCG tablet Take 1 tablet by mouth daily. 04/17/21   [name], FNP  OneTouch Delica Lancets 33G MISC Use as directed to check 1 time per day dx: e11.65 02/07/21   [name], FNP  [facility] VERIO test strip Use as directed to check 1 time per day dx: e11.65 02/07/21   [name], FNP  PAIN MANAGEMENT INTRATHECAL, IT, PUMP 1 each by Intrathecal route continuous. Intrathecal (IT) medication: fentanyl, clonidine, bupivicaine  Fentanyl 250mg /bupivicaine150mg /clonidine2mg   51.97mg  per day, 2mg  per day, 1.27mcg 15.33ml/qmin    [provider]  polyethylene glycol powder (GLYCOLAX/MIRALAX) 17 GM/SCOOP powder MIX 17 GRAMS IN 8 OUNCES OF LIQUID AND DRINK DAILY FOR BOWELS 12/17/19   Rodriguez-Southworth, Nettie Elm, PA-C  QUEtiapine (SEROQUEL XR) 400 MG 24 hr tablet Take 400 mg by mouth 2 (two) times daily.    [provider]  QUEtiapine (SEROQUEL) 300 MG tablet Take 300 mg by mouth at bedtime.    [provider]  sitaGLIPtin-metformin (JANUMET) 50-1000 MG tablet Take 1 tablet by mouth daily. 09/19/21   [name], FNP  sucralfate (CARAFATE) 1 GM/10ML  suspension Take 10 mLs (1 g total) by mouth 2 (two) times daily. 10/10/21 10/10/22  [name], FNP  tiZANidine (ZANAFLEX) 4 MG tablet Take 4 mg by mouth every 4 (four) hours. 08/18/19   [provider]  traZODone (DESYREL) 100 MG tablet 5 qhs prescribed by psychiatry Patient taking differently: Take 100-200 mg by mouth at bedtime. 5 qhs prescribed by psychiatry 02/18/19   Rodriguez-Southworth, Nettie Elm, PA-C  triamcinolone cream (KENALOG) 0.1 % Apply 1 application topically daily as needed (for itching). 03/08/19   Rodriguez-Southworth, Nettie Elm, PA-C  TRINTELLIX 20 MG TABS tablet Take 20 mg by mouth daily. 02/22/20   [provider]    Allergies Lisinopril, Latex, Morphine and related, and Adhesive [tape]   REVIEW OF SYSTEMS  Negative except as noted here or in the History of Present Illness.   PHYSICAL EXAMINATION  Initial Vital Signs Blood pressure (!) 142/111, pulse 73, temperature 98.1 F (36.7 C), resp. rate 19, height 5\' 2"  (1.575 m), weight 59 kg, SpO2 100 %.  Examination General: Well-developed, thin female in no acute distress; appearance consistent with age of record HENT: normocephalic; atraumatic Eyes: pupils equal, round and reactive to light; extraocular muscles intact Neck: supple Heart: regular rate and rhythm Lungs: clear to auscultation bilaterally Abdomen: soft; nondistended; diffusely tender; no masses or hepatosplenomegaly; bowel sounds present Extremities: No deformity; full range of motion; pulses normal Neurologic: Awake, alert and oriented; motor function intact in all extremities and symmetric; no facial droop Skin: Warm and dry Psychiatric: Tearful   RESULTS  Summary of this visit's results, reviewed and interpreted by myself:   EKG Interpretation  Date/Time:    Ventricular Rate:    PR Interval:    QRS Duration:   QT Interval:    QTC Calculation:   R Axis:     Text Interpretation:  Laboratory Studies: Results for  orders placed or performed during the hospital encounter of 10/11/21 (from the past 24 hour(s))  CBC with Differential     Status: Abnormal   Collection Time: 10/11/21 12:30 AM  Result Value Ref Range   WBC 12.8 (H) 4.0 - 10.5 K/uL   RBC 4.90 3.87 - 5.11 MIL/uL   Hemoglobin 13.4 12.0 - 15.0 g/dL   HCT 11.9 14.7 - 82.9 %   MCV 80.6 80.0 - 100.0 fL   MCH 27.3 26.0 - 34.0 pg   MCHC 33.9 30.0 - 36.0 g/dL   RDW 56.2 13.0 - 86.5 %   Platelets 438 (H) 150 - 400 K/uL   nRBC 0.0 0.0 - 0.2 %   Neutrophils Relative % 73 %   Neutro Abs 9.3 (H) 1.7 - 7.7 K/uL   Lymphocytes Relative 22 %   Lymphs Abs 2.8 0.7 - 4.0 K/uL   Monocytes Relative 5 %   Monocytes Absolute 0.6 0.1 - 1.0 K/uL   Eosinophils Relative 0 %   Eosinophils Absolute 0.0 0.0 - 0.5 K/uL   Basophils Relative 0 %   Basophils Absolute 0.0 0.0 - 0.1 K/uL   Immature Granulocytes 0 %   Abs Immature Granulocytes 0.04 0.00 - 0.07 K/uL  Comprehensive metabolic panel     Status: Abnormal   Collection Time: 10/11/21 12:30 AM  Result Value Ref Range   Sodium 136 135 - 145 mmol/L   Potassium 3.3 (L) 3.5 - 5.1 mmol/L   Chloride 103 98 - 111 mmol/L   CO2 22 22 - 32 mmol/L   Glucose, Bld 143 (H) 70 - 99 mg/dL   BUN 15 6 - 20 mg/dL   Creatinine, Ser 7.84 0.44 - 1.00 mg/dL   Calcium 9.8 8.9 - 69.6 mg/dL   Total Protein 7.5 6.5 - 8.1 g/dL   Albumin 4.3 3.5 - 5.0 g/dL   AST 15 15 - 41 U/L   ALT 11 0 - 44 U/L   Alkaline Phosphatase 62 38 - 126 U/L   Total Bilirubin 0.4 0.3 - 1.2 mg/dL   GFR, Estimated >29 >52 mL/min   Anion gap 11 5 - 15  Lipase, blood     Status: Abnormal   Collection Time: 10/11/21 12:30 AM  Result Value Ref Range   Lipase 54 (H) 11 - 51 U/L  Urinalysis, Routine w reflex microscopic Urine, Clean Catch     Status: Abnormal   Collection Time: 10/11/21 12:42 AM  Result Value Ref Range   Color, Urine YELLOW YELLOW   APPearance CLEAR CLEAR   Specific Gravity, Urine 1.011 1.005 - 1.030   pH 6.0 5.0 - 8.0   Glucose, UA  NEGATIVE NEGATIVE mg/dL   Hgb urine dipstick SMALL (A) NEGATIVE   Bilirubin Urine NEGATIVE NEGATIVE   Ketones, ur NEGATIVE NEGATIVE mg/dL   Protein, ur NEGATIVE NEGATIVE mg/dL   Nitrite NEGATIVE NEGATIVE   Leukocytes,Ua MODERATE (A) NEGATIVE   RBC / HPF 0-5 0 - 5 RBC/hpf   WBC, UA 6-10 0 - 5 WBC/hpf   Bacteria, UA NONE SEEN NONE SEEN   Squamous Epithelial / LPF 0-5 0 - 5   Mucus PRESENT   Rapid urine drug screen (hospital performed)     Status: Abnormal   Collection Time: 10/11/21 12:42 AM  Result Value Ref Range   Opiates NONE DETECTED NONE DETECTED   Cocaine NONE DETECTED NONE DETECTED   Benzodiazepines POSITIVE (A) NONE DETECTED   Amphetamines NONE DETECTED NONE DETECTED  Tetrahydrocannabinol NONE DETECTED NONE DETECTED   Barbiturates NONE DETECTED NONE DETECTED  Pregnancy, urine     Status: None   Collection Time: 10/11/21 12:42 AM  Result Value Ref Range   Preg Test, Ur NEGATIVE NEGATIVE   Imaging Studies: CT ABDOMEN PELVIS W CONTRAST  Result Date: 10/11/2021 CLINICAL DATA:  Abdominal pain EXAM: CT ABDOMEN AND PELVIS WITH CONTRAST TECHNIQUE: Multidetector CT imaging of the abdomen and pelvis was performed using the standard protocol following bolus administration of intravenous contrast. RADIATION DOSE REDUCTION: This exam was performed according to the departmental dose-optimization program which includes automated exposure control, adjustment of the mA and/or kV according to patient size and/or use of iterative reconstruction technique. CONTRAST:  OMNIPAQUE IOHEXOL 300 MG/ML  SOLN COMPARISON:  04/30/2021 FINDINGS: Lower chest: Linear scarring/atelectasis in the bilateral lower lobes. Hepatobiliary: Liver is within normal limits. Gallbladder is unremarkable. No intrahepatic or extrahepatic duct dilatation. Pancreas: Within normal limits. Spleen: Rounded spleen with additional splenules in the left upper abdomen. Adrenals/Urinary Tract: 10 mm probable left adrenal adenoma  (series 2/image 19), unchanged. Right adrenal gland is within normal limits. Kidneys are within normal limits.  No hydronephrosis. Bladder is within normal limits. Stomach/Bowel: Stomach is within normal limits. No evidence of bowel obstruction. Stable bowel rotation abnormality with small bowel in the left abdomen and colon in the right abdomen. Appendix is not discretely visualized. Mild to moderate colonic stool burden, suggesting mild constipation. Vascular/Lymphatic: No evidence of abdominal aortic aneurysm. No suspicious abdominopelvic lymphadenopathy. Reproductive: Uterus is within normal limits. Bilateral ovaries are within normal limits. Other: No abdominopelvic ascites. Prior ventral hernia repair. Musculoskeletal: Status post PLIF at L5-S1. IMPRESSION: Unremarkable CT abdomen/pelvis. No acute findings to account for the patient's abdominal pain. Electronically Signed   By: Charline Bills M.D.   On: 10/11/2021 03:15    ED COURSE and MDM  Nursing notes, initial and subsequent vitals signs, including pulse oximetry, reviewed and interpreted by myself.  Vitals:   10/11/21 0033 10/11/21 0100 10/11/21 0200 10/11/21 0309  BP: (!) 136/94 (!) 130/91 132/80 (!) 158/95  Pulse: 76 70 75 80  Resp: (!) Temp:      SpO2: 100% 100% 97% 100%  Weight:      Height:       Medications  sodium chloride (PF) 0.9 % injection (has no administration in time range)  HYDROmorphone (DILAUDID) injection 2 mg (has no administration in time range)  sodium chloride 0.9 % bolus 1,000 mL (0 mLs Intravenous Stopped 10/11/21 0300)  ondansetron (ZOFRAN) injection 4 mg (4 mg Intravenous Given 10/11/21 0055)  metoCLOPramide (REGLAN) injection 10 mg (10 mg Intravenous Given 10/11/21 0058)  HYDROmorphone (DILAUDID) injection 2 mg (2 mg Intravenous Given 10/11/21 0100)  diphenhydrAMINE (BENADRYL) injection 25 mg (25 mg Intravenous Given 10/11/21 0055)  pantoprazole (PROTONIX) injection 40 mg (40 mg Intravenous  Given 10/11/21 0057)  iohexol (OMNIPAQUE) 300 MG/ML solution 100 mL (100 mLs Intravenous Contrast Given 10/11/21 0252)   3:35 AM Patient informed of reassuring CT scan and laboratory studies.  I suspect this represents an exacerbation of her chronic abdominal pain.  She may also be having an adverse reaction to the medications she was given for treatment of H. pylori.  She understands she cannot get any additional narcotic prescriptions but we will write for Zofran for nausea and she can contact her pain management doctor regarding pain control and her PCP regarding her H. pylori treatment   PROCEDURES  Procedures  ED DIAGNOSES     ICD-10-CM   1. Chronic abdominal pain  R10.9    G89.29     2. Nausea and vomiting in adult  R11.2          Paula Libra, MD 10/11/21 [phone]

## 2021-10-11 NOTE — ED Triage Notes (Addendum)
Pt from home via EMS c/o generalized abdominal pain, acute on chronic back and neck pain, nausea, emesis x4 today and diarrhea x6. Pt states that these symptoms have been persistent most days for about 4 months, but have been severe since last night. Pt was diagnosed with H. Pylori on Monday by her PCP. Pt has fentanyl medication pump implanted but it is not helping with her pain. Pt is tearful during triage. Pt A&Ox4, VSS. ?

## 2021-10-11 NOTE — Telephone Encounter (Signed)
Transition Care Management Follow-up Telephone Call ?Date of discharge and from where: 10/11/2021 [facility] [place] ?How have you been since you were released from the hospital? Pt states she is still pain, still hurts.  ?Any questions or concerns? No ? ?Items Reviewed: ?Did the pt receive and understand the discharge instructions provided? Yes  ?Medications obtained and verified? Yes  ?Other? Yes  ?Any new allergies since your discharge? No  ?Dietary orders reviewed? Yes ?Do you have support at home? Yes  ? ?Home Care and Equipment/Supplies: ?Were home health services ordered? no ?If so, what is the name of the agency? No   ?Has the agency set up a time to come to the patient's home? no ?Were any new equipment or medical supplies ordered?  No ?What is the name of the medical supply agency? no ?Were you able to get the supplies/equipment? no ?Do you have any questions related to the use of the equipment or supplies? No ? ?Functional Questionnaire: (I = Independent and D = Dependent) ?ADLs: i ? ?Bathing/Dressing- i ? ?Meal Prep- i ? ?Eating- i ? ?Maintaining continence- i ? ?Transferring/Ambulation- i ? ?Managing Meds- i ? ?Follow up appointments reviewed: ? ?PCP Hospital f/u appt confirmed? No  Scheduled to see n/a on n/a @ n/a. ?[facility] f/u appt confirmed? No  Scheduled to see n/a on n/a @ n/a. ?Are transportation arrangements needed? No  ?If their condition worsens, is the pt aware to call PCP or go to the Emergency Dept.? Yes ?Was the patient provided with contact information for the PCP's office or ED? Yes ?Was to pt encouraged to call back with questions or concerns? Yes  ?

## 2021-10-11 NOTE — ED Notes (Signed)
Lab called for pregnancy test to be added to specimen in lab ?

## 2021-10-15 ENCOUNTER — Ambulatory Visit: Payer: Self-pay

## 2021-10-15 ENCOUNTER — Telehealth: Payer: MEDICARE

## 2021-10-15 DIAGNOSIS — M5416 Radiculopathy, lumbar region: Secondary | ICD-10-CM

## 2021-10-15 DIAGNOSIS — K219 Gastro-esophageal reflux disease without esophagitis: Secondary | ICD-10-CM

## 2021-10-15 DIAGNOSIS — E063 Autoimmune thyroiditis: Secondary | ICD-10-CM

## 2021-10-15 DIAGNOSIS — G894 Chronic pain syndrome: Secondary | ICD-10-CM

## 2021-10-15 DIAGNOSIS — I10 Essential (primary) hypertension: Secondary | ICD-10-CM

## 2021-10-15 DIAGNOSIS — E119 Type 2 diabetes mellitus without complications: Secondary | ICD-10-CM

## 2021-10-16 ENCOUNTER — Other Ambulatory Visit: Payer: Self-pay

## 2021-10-16 MED ORDER — FLUTICASONE PROPIONATE HFA 110 MCG/ACT IN AERO: 2.0000 | INHALATION_SPRAY | Freq: Every day | RESPIRATORY_TRACT | 2 refills | Status: DC

## 2021-10-16 NOTE — Chronic Care Management (AMB) (Signed)
Chronic Care Management   CCM RN Visit Note  10/15/2021 Name: Linda Mercado MRN: [id number] DOB: 1966-06-11  Subjective: Linda Mercado is a 56 y.o. year old female who is a primary care patient of [name], FNP. The care management team was consulted for assistance with disease management and care coordination needs.    Engaged with patient by telephone for follow up visit in response to provider referral for case management and/or care coordination services.   Consent to Services:  The patient was given information about Chronic Care Management services, agreed to services, and gave verbal consent prior to initiation of services.  Please see initial visit note for detailed documentation.   Patient agreed to services and verbal consent obtained.   Assessment: Review of patient past medical history, allergies, medications, health status, including review of consultants reports, laboratory and other test data, was performed as part of comprehensive evaluation and provision of chronic care management services.   SDOH (Social Determinants of Health) assessments and interventions performed:  Yes, no acute challenges  CCM Care Plan  Allergies  Allergen Reactions   Lisinopril Hives    Other reaction(s): anaphylaxis/angioedema   Latex Hives   Morphine And Related     Itching, skin get hot.    Adhesive [Tape] Rash    Outpatient Encounter Medications as of 10/15/2021  Medication Sig   albuterol (VENTOLIN HFA) 108 (90 Base) MCG/ACT inhaler Inhale 2 puffs into the lungs every 6 (six) hours as needed for wheezing or shortness of breath.   Alcohol Swabs PADS Use as directed to check blood sugars   AMITIZA 24 MCG capsule Take 24 mcg by mouth 2 (two) times daily.    amoxicillin (AMOXIL) 875 MG tablet Take 1 tablet (875 mg total) by mouth 2 (two) times daily.   atenolol (TENORMIN) 100 MG tablet TAKE 1 TABLET BY MOUTH EVERY DAY   atorvastatin (LIPITOR) 10 MG tablet TAKE 1 TABLET(10 MG) BY  MOUTH AT BEDTIME   BD PEN NEEDLE NANO 2ND GEN 32G X 4 MM MISC USE AS DIRECTED WITH TRESIBA DAILY   Blood Glucose Monitoring Suppl (ONETOUCH VERIO) w/Device KIT Use as directed to check 1 time per day dx: e11.65   calcium gluconate 500 MG tablet Take 1 tablet (500 mg total) by mouth 3 (three) times daily.   CAPLYTA 42 MG CAPS Take 42 mg by mouth at bedtime.    Carbamazepine (EQUETRO) 300 MG CP12 Take by mouth.   clonazePAM (KLONOPIN) 1 MG tablet Take 1 mg by mouth 3 (three) times daily.   desvenlafaxine (PRISTIQ) 50 MG 24 hr tablet Take by mouth.   dexlansoprazole (DEXILANT) 60 MG capsule Take 1 capsule (60 mg total) by mouth daily. Patient will need appt for further refills   diclofenac Sodium (VOLTAREN) 1 % GEL APPLY 4 GRAMS TO THE AFFECTED AREA FOUR TIMES DAILY   docusate sodium (COLACE) 50 MG capsule Take 50 mg by mouth daily.    FLOVENT HFA 110 MCG/ACT inhaler INHALE 2 PUFFS INTO THE LUNGS EVERY 12 HOURS   fluconazole (DIFLUCAN) 100 MG tablet Take 1 tablet (100 mg total) by mouth daily. Take 1 tablet by mouth now repeat in 5 days   Glucagon (GVOKE HYPOPEN 2-PACK) 0.5 MG/0.1ML SOAJ Inject 1 each into the skin as needed.   glucose blood (ACCU-CHEK GUIDE) test strip USE TO TEST TWICE DAILY AS DIRECTED   hydrochlorothiazide (HYDRODIURIL) 12.5 MG tablet TAKE 1 TABLET(12.5 MG) BY MOUTH DAILY   HYDROmorphone (DILAUDID) 4 MG  tablet Take 4 mg by mouth 4 (four) times daily as needed for severe pain.   hydrOXYzine (ATARAX/VISTARIL) 25 MG tablet Take 50 mg by mouth in the morning, at noon, in the evening, and at bedtime.   lidocaine (XYLOCAINE) 2 % solution Use as directed 15 mLs in the mouth or throat 3 (three) times a week.    Lidocaine 4 % PTCH Place 1 patch onto the skin in the morning and at bedtime.   LORazepam (ATIVAN) 2 MG tablet Take 2 mg by mouth 4 (four) times daily as needed for anxiety.    losartan (COZAAR) 25 MG tablet Take 1 tablet (25 mg total) by mouth daily.   lubiprostone (AMITIZA)  24 MCG capsule Take by mouth.   meclizine (ANTIVERT) 12.5 MG tablet Take 1 tablet (12.5 mg total) by mouth 3 (three) times daily as needed for dizziness.   meloxicam (MOBIC) 15 MG tablet Take 15 mg by mouth daily. (Patient not taking: Reported on 10/15/2021)   Multiple Vitamin (MULTIVITAMIN WITH MINERALS) TABS tablet Take 1 tablet by mouth daily.    NARCAN 4 MG/0.1ML LIQD nasal spray kit Place 1 spray into the nose as directed.   Norgestimate-Ethinyl Estradiol Triphasic (TRI-SPRINTEC) 0.18/0.215/0.25 MG-35 MCG tablet Take 1 tablet by mouth daily.   ondansetron (ZOFRAN-ODT) 8 MG disintegrating tablet Take 1 tablet (8 mg total) by mouth every 8 (eight) hours as needed for nausea or vomiting.  ODT q4 hours prn nausea   OneTouch Delica Lancets 33G MISC Use as directed to check 1 time per day dx: e11.65   ONETOUCH VERIO test strip Use as directed to check 1 time per day dx: e11.65   PAIN MANAGEMENT INTRATHECAL, IT, PUMP 1 each by Intrathecal route continuous. Intrathecal (IT) medication: fentanyl, clonidine, bupivicaine  Fentanyl /bupivicaine150mg /clonidine2mg   51.97mg  per day,  per day, 1.41mcg 15.78ml/qmin   polyethylene glycol powder (GLYCOLAX/MIRALAX) 17 GM/SCOOP powder MIX 17 GRAMS IN 8 OUNCES OF LIQUID AND DRINK DAILY FOR BOWELS   QUEtiapine (SEROQUEL XR) 400 MG 24 hr tablet Take 400 mg by mouth 2 (two) times daily.   QUEtiapine (SEROQUEL) 300 MG tablet Take 300 mg by mouth at bedtime.   sitaGLIPtin-metformin (JANUMET) 50-1000 MG tablet Take 1 tablet by mouth daily.   sucralfate (CARAFATE) 1 GM/10ML suspension Take 10 mLs (1 g total) by mouth 2 (two) times daily.   tiZANidine (ZANAFLEX) 4 MG tablet Take 4 mg by mouth every 4 (four) hours.   traZODone (DESYREL) 100 MG tablet 5 qhs prescribed by psychiatry (Patient taking differently: Take 100-200 mg by mouth at bedtime. 5 qhs prescribed by psychiatry)   triamcinolone cream (KENALOG) 0.1 % Apply 1 application topically daily as needed  (for itching).   TRINTELLIX 20 MG TABS tablet Take 20 mg by mouth daily.   No facility-administered encounter medications on file as of 10/15/2021.    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   Hypokalemia 03/18/2018   Intractable back pain 01/03/2018   HTN (hypertension) 01/03/2018  Type 2 diabetes mellitus without complication (HCC) 01/03/2018   Vitamin D deficiency 07/11/2017   Postmenopausal 04/24/2017   Microalbuminuria 01/14/2017   Dyspareunia in female 12/10/2016   Endometriosis determined by laparoscopy 12/10/2016   History of exploratory laparotomy 12/10/2016   Pelvic pain 12/10/2016   Bipolar 1 disorder (HCC) 09/19/2016   Cyst of right ovary 09/19/2016   Severe episode of recurrent major depressive disorder, with psychotic features (HCC) 09/19/2016   Schizoaffective disorder, bipolar type (HCC) 09/19/2016   Chronic pain 03/05/2016   Non morbid obesity due to excess calories 03/05/2016   S/P lumbar fusion 03/05/2016    Conditions to be addressed/monitored: Type 2 diabetes Mellitus, Essential hypertension, Chronic pain syndrome, Hashimoto's disease, Lumbar radiculopathy, GERD   Care Plan : RN Care Manager Plan of Care  Updates made by Riley Churches, RN since 10/15/2021 12:00 AM     Problem: No plan established for care established for management of chronic disease states (Type 2 diabetes Mellitus, Essential hypertension, Chronic pain syndrome, Hashimoto's disease, Lumbar  radiculopathy, GERD)   Priority: High     Long-Range Goal: Development of plan of care for chronic disease management for (Type 2 diabetes Mellitus, Essential hypertension, Chronic pain syndrome, Hashimoto's disease, Lumbar radiculopathy, GERD)   Start Date: 06/28/2021  Expected End Date: 06/28/2022  Recent Progress: On track  Priority: High  Note:   Current Barriers:  Knowledge Deficits related to plan of care for management of Type 2 diabetes Mellitus, Essential hypertension, Chronic pain syndrome, Hashimoto's disease, Lumbar radiculopathy, GERD   Chronic Disease Management support and education needs related to Type 2 diabetes Mellitus, Essential hypertension, Chronic pain syndrome, Hashimoto's disease, Lumbar radiculopathy, GERD   Lacks caregiver support Transportation barriers  RNCM Clinical Goal(s):  Patient will verbalize basic understanding of  Type 2 diabetes Mellitus, Essential hypertension, Chronic pain syndrome, Hashimoto's disease, Lumbar radiculopathy, GERD  disease process and self health management plan as evidenced by patient will report having no disease exacerbations related to her chronic disease states listed above take all medications exactly as prescribed and will call provider for medication related questions as evidenced by patient will report having no missed doses of her prescribed medications  demonstrate Improved health management independence as evidenced by patient will report 100% adherence to following her prescribed treatment plan continue to work with RN Care Manager to address care management and care coordination needs related to  Type 2 diabetes Mellitus, Essential hypertension, Chronic pain syndrome, Hashimoto's disease, Lumbar radiculopathy, GERD  as evidenced by adherence to CM Team Scheduled appointments through collaboration with RN Care manager, provider, and care team.   Interventions: 1:1 collaboration with primary care provider regarding development  and update of comprehensive plan of care as evidenced by provider attestation and co-signature Inter-disciplinary care team collaboration (see longitudinal plan of care) Evaluation of current treatment plan related to  self management and patient's adherence to plan as established by provider  Diabetes Interventions:  (Status:  Goal on track:  Yes.) Long Term Goal Assessed patient's understanding of A1c goal:  <5.7 Reviewed medications with patient and discussed importance of medication adherence Counseled on importance of regular laboratory monitoring as prescribed Review of patient status, including review of consultants reports, relevant laboratory and other test results, and medications completed Reiterated the importance of ongoing adherence to following a diabetic friendly diet  Lab Results  Component Value Date   HGBA1C 5.2 09/19/2021  Pain Interventions:  (Status:  Condition stable.  Not addressed this visit.) Long Term Goal  Pain assessment performed Medications reviewed Reviewed provider established plan for pain management Discussed importance of adherence to all scheduled medical appointments Counseled on the importance of reporting any/all new or changed pain symptoms or management strategies to pain management provider Advised patient to report to care team affect of pain on daily activities Discussed use of relaxation techniques and/or diversional activities to assist with pain reduction (distraction, imagery, relaxation, massage, acupressure, TENS, heat, and cold application Reviewed with patient prescribed pharmacological and nonpharmacological pain relief strategies Screening for signs and symptoms of 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  Assessed social determinant of health barriers Discussed plans with patient for ongoing care management follow up and provided patient with direct contact information for care management team  Falls Interventions:  (Status:  Goal on  track:  Yes.) Long Term Goal Reviewed medications and discussed potential side effects of medications such as dizziness and frequent urination Advised patient of importance of notifying provider of falls Assessed for falls since last encounter Assessed working status of life alert bracelet and patient adherence Discussed plans with patient for ongoing care management follow up and provided patient with direct contact information for care management team  Nausea and Vomiting:  (Status:  Goal on track:  Yes.)  Long Term Goal Evaluation of current treatment plan related to GERD, self-management and patient's adherence to plan as established by provider Review of patient status, including review of consultant's reports, relevant laboratory and other test results, and medications completed Determined patient tested positive for H. Pylori, Reviewed medications with patient and discussed importance of medication adherence and instructed patient to complete full course of antibiotics, patient reports adherence with taking Dexilant as directed Educated patient on basic disease process of H. Pylori, treatment options and potential complications  Determined patient's symptoms are resolving, she reports no vomiting today, she continues to have intermittent "burning" Reinforced the importance of patient drinking plenty of water to stay hydrated, avoiding caffeine, chocolate, or spicy foods  Reviewed scheduled/upcoming provider appointment including: follow up with Dr. Luciano Cutter Gastroenterologist, scheduled for 10/25/21, discussed asking Dr. Luciano Cutter to recheck for H. Pylori during this visit is he approves Discussed plans with patient for ongoing care management follow up and provided patient with direct contact information for care management team   Patient Goals/Self-Care Activities: Take all medications as prescribed Attend all scheduled provider appointments Call pharmacy for medication refills 3-7 days in advance  of running out of medications Call provider office for new concerns or questions  drink 6 to 8 glasses of water each day fill half of plate with vegetables manage portion size Complete full course of antibiotics for H. Pylori Follow up with Dr. Luciano Cutter, GI as directed   Follow Up Plan:  Telephone follow up appointment with care management team member scheduled for:  11/06/21      Delsa Sale, RN, BSN, CCM Care Management Coordinator [facility]  Direct Phone: [phone]

## 2021-10-16 NOTE — Patient Instructions (Signed)
Visit Information ? ?Thank you for taking time to visit with me today. Please don't hesitate to contact me if I can be of assistance to you before our next scheduled telephone appointment. ? ?Following are the goals we discussed today:  ?(Copy and paste patient goals from clinical care plan here) ? ?Our next appointment is by telephone on 11/06/21 at 12 PM ? ?Please call the care guide team at [phone] if you need to cancel or reschedule your appointment.  ? ?If you are experiencing a Mental Health or [place] or need someone to talk to, please call 1-800-273-TALK (toll free, 24 hour hotline)  ? ?Patient verbalizes understanding of instructions and care plan provided today and agrees to view in [place]. Active MyChart status confirmed with patient.   ? ?Barb Merino, RN, BSN, CCM ?Care Management Coordinator ?[facility]  ?Direct Phone: [phone] ? ? ?

## 2021-10-19 DIAGNOSIS — E119 Type 2 diabetes mellitus without complications: Secondary | ICD-10-CM

## 2021-10-19 DIAGNOSIS — I10 Essential (primary) hypertension: Secondary | ICD-10-CM

## 2021-10-23 ENCOUNTER — Other Ambulatory Visit: Payer: Self-pay | Admitting: Nurse Practitioner

## 2021-10-23 DIAGNOSIS — K219 Gastro-esophageal reflux disease without esophagitis: Secondary | ICD-10-CM

## 2021-10-24 ENCOUNTER — Telehealth: Payer: MEDICARE

## 2021-10-30 ENCOUNTER — Telehealth: Payer: Self-pay

## 2021-10-30 NOTE — Telephone Encounter (Signed)
? ?  Telephone encounter was:  Successful.  ?10/30/2021 ?Name: Linda Mercado MRN: [id number] DOB: 1966-03-22 ? ?Linda Mercado is a 56 y.o. year old female who is a primary care patient of [name], [place] . The community resource team was consulted for assistance with Transportation Needs  ? ?Care guide performed the following interventions: Follow up call placed to the patient to discuss status of referral.follow up for mailed resources, patient did receive the resources ? ?Larena Sox ?Care Guide, Embedded Care Coordination ?[place], Care Management  ?[phone] ?[address], [place], [place] [address] ?Phone: [phone] ?Email: [email or web address] ? ?  ?

## 2021-11-04 ENCOUNTER — Other Ambulatory Visit: Payer: Self-pay | Admitting: Nurse Practitioner

## 2021-11-06 ENCOUNTER — Other Ambulatory Visit: Payer: Self-pay | Admitting: Nurse Practitioner

## 2021-11-06 ENCOUNTER — Telehealth: Payer: MEDICARE

## 2021-11-06 ENCOUNTER — Ambulatory Visit (INDEPENDENT_AMBULATORY_CARE_PROVIDER_SITE_OTHER): Payer: Medicaid Other

## 2021-11-06 DIAGNOSIS — K219 Gastro-esophageal reflux disease without esophagitis: Secondary | ICD-10-CM

## 2021-11-06 DIAGNOSIS — E119 Type 2 diabetes mellitus without complications: Secondary | ICD-10-CM

## 2021-11-06 DIAGNOSIS — M5416 Radiculopathy, lumbar region: Secondary | ICD-10-CM

## 2021-11-06 DIAGNOSIS — I10 Essential (primary) hypertension: Secondary | ICD-10-CM

## 2021-11-06 DIAGNOSIS — F250 Schizoaffective disorder, bipolar type: Secondary | ICD-10-CM

## 2021-11-06 DIAGNOSIS — G894 Chronic pain syndrome: Secondary | ICD-10-CM

## 2021-11-06 DIAGNOSIS — E063 Autoimmune thyroiditis: Secondary | ICD-10-CM

## 2021-11-07 ENCOUNTER — Other Ambulatory Visit: Payer: Self-pay

## 2021-11-07 ENCOUNTER — Telehealth: Payer: Self-pay

## 2021-11-07 DIAGNOSIS — E119 Type 2 diabetes mellitus without complications: Secondary | ICD-10-CM

## 2021-11-07 MED ORDER — JANUMET 50-1000 MG PO TABS: 1.0000 | ORAL_TABLET | Freq: Every day | ORAL | 1 refills | Status: DC

## 2021-11-07 NOTE — Chronic Care Management (AMB) (Addendum)
Chronic Care Management   CCM RN Visit Note  11/06/2021 Name: Linda Mercado MRN: [id number] DOB: Jan 01, 1966  Subjective: Linda Mercado is a 56 y.o. year old female who is a primary care patient of [name], FNP. The care management team was consulted for assistance with disease management and care coordination needs.    Engaged with patient by telephone for follow up visit in response to provider referral for case management and/or care coordination services.   Consent to Services:  The patient was given information about Chronic Care Management services, agreed to services, and gave verbal consent prior to initiation of services.  Please see initial visit note for detailed documentation.   Patient agreed to services and verbal consent obtained.   Assessment: Review of patient past medical history, allergies, medications, health status, including review of consultants reports, laboratory and other test data, was performed as part of comprehensive evaluation and provision of chronic care management services.   SDOH (Social Determinants of Health) assessments and interventions performed:  Yes, no acute needs  CCM Care Plan  Allergies  Allergen Reactions   Lisinopril Hives    Other reaction(s): anaphylaxis/angioedema   Latex Hives   Morphine And Related     Itching, skin get hot.    Adhesive [Tape] Rash    Outpatient Encounter Medications as of 11/06/2021  Medication Sig   albuterol (VENTOLIN HFA) 108 (90 Base) MCG/ACT inhaler TAKE 2 PUFFS BY MOUTH EVERY 6 HOURS AS NEEDED FOR WHEEZE OR SHORTNESS OF BREATH   Alcohol Swabs PADS Use as directed to check blood sugars   AMITIZA 24 MCG capsule Take 24 mcg by mouth 2 (two) times daily.    amoxicillin (AMOXIL) 875 MG tablet Take 1 tablet (875 mg total) by mouth 2 (two) times daily.   atenolol (TENORMIN) 100 MG tablet TAKE 1 TABLET BY MOUTH EVERY DAY   atorvastatin (LIPITOR) 10 MG tablet TAKE 1 TABLET(10 MG) BY MOUTH AT BEDTIME   BD  PEN NEEDLE NANO 2ND GEN 32G X 4 MM MISC USE AS DIRECTED WITH TRESIBA DAILY   Blood Glucose Monitoring Suppl (ONETOUCH VERIO) w/Device KIT Use as directed to check 1 time per day dx: e11.65   calcium gluconate 500 MG tablet Take 1 tablet (500 mg total) by mouth 3 (three) times daily.   CAPLYTA 42 MG CAPS Take 42 mg by mouth at bedtime.    Carbamazepine (EQUETRO) 300 MG CP12 Take by mouth.   clonazePAM (KLONOPIN) 1 MG tablet Take 1 mg by mouth 3 (three) times daily.   desvenlafaxine (PRISTIQ) 50 MG 24 hr tablet Take by mouth.   dexlansoprazole (DEXILANT) 60 MG capsule TAKE 1 CAPSULE (60 MG TOTAL) BY MOUTH DAILY. PATIENT WILL NEED APPT FOR FURTHER REFILLS   diclofenac Sodium (VOLTAREN) 1 % GEL APPLY 4 GRAMS TO THE AFFECTED AREA FOUR TIMES DAILY   docusate sodium (COLACE) 50 MG capsule Take 50 mg by mouth daily.    fluconazole (DIFLUCAN) 100 MG tablet Take 1 tablet (100 mg total) by mouth daily. Take 1 tablet by mouth now repeat in 5 days   fluticasone (FLOVENT HFA) 110 MCG/ACT inhaler Inhale 2 puffs into the lungs daily.   Glucagon (GVOKE HYPOPEN 2-PACK) 0.5 MG/0.1ML SOAJ Inject 1 each into the skin as needed.   glucose blood (ACCU-CHEK GUIDE) test strip USE TO TEST TWICE DAILY AS DIRECTED   hydrochlorothiazide (HYDRODIURIL) 12.5 MG tablet TAKE 1 TABLET (12.5 MG) BY MOUTH DAILY   HYDROmorphone (DILAUDID) 4 MG tablet Take  4 mg by mouth 4 (four) times daily as needed for severe pain.   hydrOXYzine (ATARAX/VISTARIL) 25 MG tablet Take 50 mg by mouth in the morning, at noon, in the evening, and at bedtime.   lidocaine (XYLOCAINE) 2 % solution Use as directed 15 mLs in the mouth or throat 3 (three) times a week.    Lidocaine 4 % PTCH Place 1 patch onto the skin in the morning and at bedtime.   LORazepam (ATIVAN) 2 MG tablet Take 2 mg by mouth 4 (four) times daily as needed for anxiety.    losartan (COZAAR) 25 MG tablet Take 1 tablet (25 mg total) by mouth daily.   lubiprostone (AMITIZA) 24 MCG capsule  Take by mouth.   meclizine (ANTIVERT) 12.5 MG tablet Take 1 tablet (12.5 mg total) by mouth 3 (three) times daily as needed for dizziness.   meloxicam (MOBIC) 15 MG tablet Take 15 mg by mouth daily. (Patient not taking: Reported on 10/15/2021)   Multiple Vitamin (MULTIVITAMIN WITH MINERALS) TABS tablet Take 1 tablet by mouth daily.    NARCAN 4 MG/0.1ML LIQD nasal spray kit Place 1 spray into the nose as directed.   Norgestimate-Ethinyl Estradiol Triphasic (TRI-SPRINTEC) 0.18/0.215/0.25 MG-35 MCG tablet Take 1 tablet by mouth daily.   ondansetron (ZOFRAN-ODT) 8 MG disintegrating tablet Take 1 tablet (8 mg total) by mouth every 8 (eight) hours as needed for nausea or vomiting.  ODT q4 hours prn nausea   OneTouch Delica Lancets 33G MISC Use as directed to check 1 time per day dx: e11.65   ONETOUCH VERIO test strip Use as directed to check 1 time per day dx: e11.65   PAIN MANAGEMENT INTRATHECAL, IT, PUMP 1 each by Intrathecal route continuous. Intrathecal (IT) medication: fentanyl, clonidine, bupivicaine  Fentanyl /bupivicaine150mg /clonidine2mg   51.97mg  per day,  per day, 1.76mcg 15.41ml/qmin   polyethylene glycol powder (GLYCOLAX/MIRALAX) 17 GM/SCOOP powder MIX 17 GRAMS IN 8 OUNCES OF LIQUID AND DRINK DAILY FOR BOWELS   QUEtiapine (SEROQUEL XR) 400 MG 24 hr tablet Take 400 mg by mouth 2 (two) times daily.   QUEtiapine (SEROQUEL) 300 MG tablet Take 300 mg by mouth at bedtime.   sucralfate (CARAFATE) 1 GM/10ML suspension Take 10 mLs (1 g total) by mouth 2 (two) times daily.   tiZANidine (ZANAFLEX) 4 MG tablet Take 4 mg by mouth every 4 (four) hours.   traZODone (DESYREL) 100 MG tablet 5 qhs prescribed by psychiatry (Patient taking differently: Take 100-200 mg by mouth at bedtime. 5 qhs prescribed by psychiatry)   triamcinolone cream (KENALOG) 0.1 % Apply 1 application topically daily as needed (for itching).   TRINTELLIX 20 MG TABS tablet Take 20 mg by mouth daily.   [DISCONTINUED]  sitaGLIPtin-metformin (JANUMET) 50-1000 MG tablet Take 1 tablet by mouth daily.   No facility-administered encounter medications on file as of 11/06/2021.    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   Hypokalemia 03/18/2018   Intractable back pain 01/03/2018   HTN (hypertension) 01/03/2018  Type 2 diabetes mellitus without complication (HCC) 01/03/2018   Vitamin D deficiency 07/11/2017   Postmenopausal 04/24/2017   Microalbuminuria 01/14/2017   Dyspareunia in female 12/10/2016   Endometriosis determined by laparoscopy 12/10/2016   History of exploratory laparotomy 12/10/2016   Pelvic pain 12/10/2016   Bipolar 1 disorder (HCC) 09/19/2016   Cyst of right ovary 09/19/2016   Severe episode of recurrent major depressive disorder, with psychotic features (HCC) 09/19/2016   Schizoaffective disorder, bipolar type (HCC) 09/19/2016   Chronic pain 03/05/2016   Non morbid obesity due to excess calories 03/05/2016   S/P lumbar fusion 03/05/2016    Conditions to be addressed/monitored: Type 2 diabetes Mellitus, Essential hypertension, Chronic pain syndrome, Hashimoto's disease, Lumbar radiculopathy, GERD   Care Plan : RN Care Manager Plan of Care  Updates made by Riley Churches, RN since 11/06/2021 12:00 AM     Problem: No plan established for care established for management of chronic disease states (Type 2 diabetes Mellitus, Essential hypertension, Chronic pain syndrome, Hashimoto's disease, Lumbar  radiculopathy, GERD)   Priority: High     Long-Range Goal: Development of plan of care for chronic disease management for (Type 2 diabetes Mellitus, Essential hypertension, Chronic pain syndrome, Hashimoto's disease, Lumbar radiculopathy, GERD)   Start Date: 06/28/2021  Expected End Date: 06/28/2022  Recent Progress: On track  Priority: High  Note:   Current Barriers:  Knowledge Deficits related to plan of care for management of Type 2 diabetes Mellitus, Essential hypertension, Chronic pain syndrome, Hashimoto's disease, Lumbar radiculopathy, GERD   Chronic Disease Management support and education needs related to Type 2 diabetes Mellitus, Essential hypertension, Chronic pain syndrome, Hashimoto's disease, Lumbar radiculopathy, GERD   Lacks caregiver support Transportation barriers  RNCM Clinical Goal(s):  Patient will verbalize basic understanding of  Type 2 diabetes Mellitus, Essential hypertension, Chronic pain syndrome, Hashimoto's disease, Lumbar radiculopathy, GERD  disease process and self health management plan as evidenced by patient will report having no disease exacerbations related to her chronic disease states listed above take all medications exactly as prescribed and will call provider for medication related questions as evidenced by patient will report having no missed doses of her prescribed medications  demonstrate Improved health management independence as evidenced by patient will report 100% adherence to following her prescribed treatment plan continue to work with RN Care Manager to address care management and care coordination needs related to  Type 2 diabetes Mellitus, Essential hypertension, Chronic pain syndrome, Hashimoto's disease, Lumbar radiculopathy, GERD  as evidenced by adherence to CM Team Scheduled appointments through collaboration with RN Care manager, provider, and care team.   Interventions: 1:1 collaboration with primary care provider regarding development  and update of comprehensive plan of care as evidenced by provider attestation and co-signature Inter-disciplinary care team collaboration (see longitudinal plan of care) Evaluation of current treatment plan related to  self management and patient's adherence to plan as established by provider  Diabetes Interventions:  (Status:  Condition stable.  Not addressed this visit.) Long Term Goal Assessed patient's understanding of A1c goal:  <5.7 Reviewed medications with patient and discussed importance of medication adherence Counseled on importance of regular laboratory monitoring as prescribed Review of patient status, including review of consultants reports, relevant laboratory and other test results, and medications completed Reiterated the importance of ongoing adherence to following a diabetic friendly diet  Lab Results  Component Value Date   HGBA1C 5.2 09/19/2021  Pain Interventions:  (Status:  Goal on track:  Yes.) Long Term Goal  Evaluation of current treatment plan related to Chronic Pain self-management and patient's adherence to plan as established by provider  Pain assessment performed Medications reviewed, educated patient on potential SE related to chronic narcotic use including nausea, confirmed patient has Zofran on hand to use as directed  Reviewed provider established plan for pain management Discussed importance of adherence to all scheduled medical appointments Counseled on the importance of reporting any/all new or changed pain symptoms or management strategies to pain management provider Advised patient to report to care team affect of pain on daily activities Discussed use of relaxation techniques and/or diversional activities to assist with pain reduction (distraction, imagery, relaxation, massage, acupressure, TENS, heat, and cold application Reviewed with patient prescribed pharmacological and nonpharmacological pain relief strategies Screening for signs and symptoms of  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  Assessed social determinant of health barriers Discussed plans with patient for ongoing care management follow up and provided patient with direct contact information for care management team  Falls Interventions:  (Status:  Goal on track:  Yes.) Long Term Goal Reviewed medications and discussed potential side effects of medications such as dizziness and frequent urination Advised patient of importance of notifying provider of falls Assessed for falls since last encounter, patient denies  Reinforced using DME at all times to help avoid falls, instructed patient to notify PCP promptly of any/all falls and or seek medical attention if needed  Discussed plans with patient for ongoing care management follow up and provided patient with direct contact information for care management team  Nausea and Vomiting:  (Status:  Goal on track:  Yes.)  Long Term Goal Evaluation of current treatment plan related to GERD, self-management and patient's adherence to plan as established by provider Review of patient status, including review of consultant's reports, relevant laboratory and other test results, and medications completed Determined patient completed her full course of antibiotics for H. Pylori, her symptoms have resolved Determined patient completed follow up with Dr. Luciano Cutter for esophageal dilation, per patient a tissue biopsy was obtained and and was negative for H. Pylori Discussed current symptoms include intermittent diarrhea, educated patient to increase electrolyte hydration such as Pedialyte and or Gatorade zero Instructed patient to eat a bland diet until symptoms resolve, educated on hypokalemia and instructed patient to eat a Potassium rich diet once symptoms resolve  Instructed patient to keep PCP informed of persistent diarrhea for evaluation and treatment  Discussed plans with patient for ongoing care management follow up and provided patient with  direct contact information for care management team   Patient Goals/Self-Care Activities: Take all medications as prescribed Attend all scheduled provider appointments Call pharmacy for medication refills 3-7 days in advance of running out of medications Call provider office for new concerns or questions  drink 6 to 8 glasses of water each day fill half of plate with vegetables manage portion size  Follow Up Plan:  Telephone follow up appointment with care management team member scheduled for:  12/27/21     Delsa Sale, RN, BSN, CCM Care Management Coordinator [facility] Management/[facility]  Direct Phone: [phone]

## 2021-11-07 NOTE — Chronic Care Management (AMB) (Signed)
11-07-2021: Patient stated she has [place]. Patient no longer has [employer]. Patient will drop off [facility] card for a copy. Was told to follow up with patient on needing assistance with [place]. Patient stated she wasn't sure if medication has a copay. [facility] and patient doesn't have a copay. Patient also informed me that she needs refills for medication. Cleophus Molt CMA refill request. ? ? ?Malecca Hicks CMA ?Clinical Pharmacist Assistant ?[phone] ? ?

## 2021-11-07 NOTE — Patient Instructions (Signed)
Visit Information ? ?Thank you for taking time to visit with me today. Please don't hesitate to contact me if I can be of assistance to you before our next scheduled telephone appointment. ? ?Following are the goals we discussed today:  ?(Copy and paste patient goals from clinical care plan here) ? ?Our next appointment is by telephone on 12/27/21 at 12 PM ? ?Please call the care guide team at [phone] if you need to cancel or reschedule your appointment.  ? ?If you are experiencing a Mental Health or [place] or need someone to talk to, please call [phone]-TALK (toll free, 24 hour hotline)  ? ?Patient verbalizes understanding of instructions and care plan provided today and agrees to view in [place]. Active MyChart status confirmed with patient.   ? ?Barb Merino, RN, BSN, CCM ?Care Management Coordinator ?[place] Management/[facility]  ?Direct Phone: [phone] ? ? ?

## 2021-11-18 DIAGNOSIS — I10 Essential (primary) hypertension: Secondary | ICD-10-CM

## 2021-11-18 DIAGNOSIS — E119 Type 2 diabetes mellitus without complications: Secondary | ICD-10-CM

## 2021-11-27 ENCOUNTER — Other Ambulatory Visit: Payer: Self-pay | Admitting: Nurse Practitioner

## 2021-12-07 ENCOUNTER — Telehealth: Payer: Self-pay

## 2021-12-07 NOTE — Chronic Care Management (AMB) (Signed)
Chronic Care Management Pharmacy Assistant   Name: Linda Mercado  MRN: [id number] DOB: 04-25-1966  Reason for Encounter: Disease State/ Hypertension   Recent office visits:  11-06-2021 Lynne Subia, RN (CCM)  10-15-2021 Lynne Tamez, RN (CCM)  09-21-2021 Lynne Walla, RN (CCM)  09-19-2021 [name], [place]. MCH= 26.3, MCHC= 31.4. H. pylori, IgA Abs= 15.9. CHANGE janumet 50-1000 mg twice daily TO once daily.  09-19-2021 Kellie Simmering, LPN. Medicare annual wellness visit.  08-22-2021 Little, Claudette Stapler, RN (CCM)  06-28-2021 Daneen Schick (CCM)  06-28-2021 Little, Claudette Stapler, RN (CCM)  Recent consult visits:  10-22-2021 Birdie Hopes, Remo Lipps, ANP (Pain clinic). XR Spine Cervical 2-3 Views completed.  08-08-2021  Priddy Dondra Prader, ANP (Pain clinic). Bilateral SI joint injection (#2 of 2)- Fluoro- sedation- KPSC completed.  Hospital visits:  Medication Reconciliation was completed by comparing discharge summary, patient's EMR and Pharmacy list, and upon discussion with patient.  Admitted to the hospital on 10-11-2021 due to Chronic abdominal pain. Discharge date was 10-11-2021. Discharged from [place]?Medications Started at [facility] Discharge:?? Zofran 8 mg every 8 hours as needed  Medication Changes at Hospital Discharge: None  Medications Discontinued at Hospital Discharge: None  Medications that remain the same after Hospital Discharge:??  -All other medications will remain the same.    Medications: Outpatient Encounter Medications as of 12/07/2021  Medication Sig   albuterol (VENTOLIN HFA) 108 (90 Base) MCG/ACT inhaler TAKE 2 PUFFS BY MOUTH EVERY 6 HOURS AS NEEDED FOR WHEEZE OR SHORTNESS OF BREATH   Alcohol Swabs PADS Use as directed to check blood sugars   AMITIZA 24 MCG capsule Take 24 mcg by mouth 2 (two) times daily.    amoxicillin (AMOXIL) 875 MG tablet Take 1 tablet (875 mg total) by mouth 2 (two) times daily.   atenolol  (TENORMIN) 100 MG tablet TAKE 1 TABLET BY MOUTH EVERY DAY   atorvastatin (LIPITOR) 10 MG tablet TAKE 1 TABLET(10 MG) BY MOUTH AT BEDTIME   BD PEN NEEDLE NANO 2ND GEN 32G X 4 MM MISC USE AS DIRECTED WITH TRESIBA DAILY   Blood Glucose Monitoring Suppl (ONETOUCH VERIO) w/Device KIT Use as directed to check 1 time per day dx: e11.65   calcium gluconate 500 MG tablet Take 1 tablet (500 mg total) by mouth 3 (three) times daily.   CAPLYTA 42 MG CAPS Take 42 mg by mouth at bedtime.    Carbamazepine (EQUETRO) 300 MG CP12 Take by mouth.   clonazePAM (KLONOPIN) 1 MG tablet Take 1 mg by mouth 3 (three) times daily.   desvenlafaxine (PRISTIQ) 50 MG 24 hr tablet Take by mouth.   dexlansoprazole (DEXILANT) 60 MG capsule TAKE 1 CAPSULE (60 MG TOTAL) BY MOUTH DAILY. PATIENT WILL NEED APPT FOR FURTHER REFILLS   diclofenac Sodium (VOLTAREN) 1 % GEL APPLY 4 GRAMS TO THE AFFECTED AREA FOUR TIMES DAILY   docusate sodium (COLACE) 50 MG capsule Take 50 mg by mouth daily.    fluconazole (DIFLUCAN) 100 MG tablet Take 1 tablet (100 mg total) by mouth daily. Take 1 tablet by mouth now repeat in 5 days   fluticasone (FLOVENT HFA) 110 MCG/ACT inhaler Inhale 2 puffs into the lungs daily.   Glucagon (GVOKE HYPOPEN 2-PACK) 0.5 MG/0.1ML SOAJ Inject 1 each into the skin as needed.   glucose blood (ACCU-CHEK GUIDE) test strip USE TO TEST TWICE DAILY AS DIRECTED   hydrochlorothiazide (HYDRODIURIL) 12.5 MG tablet TAKE 1 TABLET (12.5 MG) BY  MOUTH DAILY   HYDROmorphone (DILAUDID) 4 MG tablet Take 4 mg by mouth 4 (four) times daily as needed for severe pain.   hydrOXYzine (ATARAX/VISTARIL) 25 MG tablet Take 50 mg by mouth in the morning, at noon, in the evening, and at bedtime.   lidocaine (XYLOCAINE) 2 % solution Use as directed 15 mLs in the mouth or throat 3 (three) times a week.    Lidocaine 4 % PTCH Place 1 patch onto the skin in the morning and at bedtime.   LORazepam (ATIVAN) 2 MG tablet Take 2 mg by mouth 4 (four) times daily  as needed for anxiety.    losartan (COZAAR) 25 MG tablet Take 1 tablet (25 mg total) by mouth daily.   lubiprostone (AMITIZA) 24 MCG capsule Take by mouth.   meclizine (ANTIVERT) 12.5 MG tablet Take 1 tablet (12.5 mg total) by mouth 3 (three) times daily as needed for dizziness.   meloxicam (MOBIC) 15 MG tablet Take 15 mg by mouth daily. (Patient not taking: Reported on 10/15/2021)   Multiple Vitamin (MULTIVITAMIN WITH MINERALS) TABS tablet Take 1 tablet by mouth daily.    NARCAN 4 MG/0.1ML LIQD nasal spray kit Place 1 spray into the nose as directed.   Norgestimate-Ethinyl Estradiol Triphasic (TRI-SPRINTEC) 0.18/0.215/0.25 MG-35 MCG tablet Take 1 tablet by mouth daily.   ondansetron (ZOFRAN-ODT) 8 MG disintegrating tablet Take 1 tablet (8 mg total) by mouth every 8 (eight) hours as needed for nausea or vomiting. 86m ODT q4 hours prn nausea   OneTouch Delica Lancets 357DMISC Use as directed to check 1 time per day dx: e11.65   ONETOUCH VERIO test strip Use as directed to check 1 time per day dx: e11.65   PAIN MANAGEMENT INTRATHECAL, IT, PUMP 1 each by Intrathecal route continuous. Intrathecal (IT) medication: fentanyl, clonidine, bupivicaine  Fentanyl 2522mbupivicaine150mg/clonidine2mg  51.9735mer day, 2mg62mr day, 1.44mc14m.4ml/q74m   polyethylene glycol powder (GLYCOLAX/MIRALAX) 17 GM/SCOOP powder MIX 17 GRAMS IN 8 OUNCES OF LIQUID AND DRINK DAILY FOR BOWELS   QUEtiapine (SEROQUEL XR) 400 MG 24 hr tablet Take 400 mg by mouth 2 (two) times daily.   QUEtiapine (SEROQUEL) 300 MG tablet Take 300 mg by mouth at bedtime.   sitaGLIPtin-metformin (JANUMET) 50-1000 MG tablet Take 1 tablet by mouth daily.   sucralfate (CARAFATE) 1 GM/10ML suspension TAKE 10 MLS (1 G TOTAL) BY MOUTH 2 (TWO) TIMES DAILY.   tiZANidine (ZANAFLEX) 4 MG tablet Take 4 mg by mouth every 4 (four) hours.   traZODone (DESYREL) 100 MG tablet 5 qhs prescribed by psychiatry (Patient taking differently: Take 100-200 mg by mouth at  bedtime. 5 qhs prescribed by psychiatry)   triamcinolone cream (KENALOG) 0.1 % Apply 1 application topically daily as needed (for itching).   TRINTELLIX 20 MG TABS tablet Take 20 mg by mouth daily.   No facility-administered encounter medications on file as of 12/07/2021.  Reviewed chart prior to disease state call. Spoke with patient regarding BP  Recent Office Vitals: BP Readings from Last 3 Encounters:  10/11/21 (!) 158/95  09/19/21 (!) 110/58  09/19/21 (!) 110/58   Pulse Readings from Last 3 Encounters:  10/11/21 80  09/19/21 81  09/19/21 81    Wt Readings from Last 3 Encounters:  10/11/21 130 lb (59 kg)  09/19/21 135 lb (61.2 kg)  09/19/21 135 lb 6.4 oz (61.4 kg)     Kidney Function Lab Results  Component Value Date/Time   CREATININE 0.81 10/11/2021 12:30 AM   CREATININE 0.97 09/19/2021  03:38 PM   GFRNONAA >60 10/11/2021 12:30 AM   GFRAA 60 08/24/2020 03:51 PM       Latest Ref Rng & Units 10/11/2021   12:30 AM 09/19/2021    3:38 PM 04/30/2021    4:00 PM  BMP  Glucose 70 - 99 mg/dL 143   74   84    BUN 6 - 20 mg/dL _0 Creatinine 0.44 - 1.00 mg/dL 0.81   0.97   1.08    BUN/Creat Ratio 9 - 23  11     Sodium 135 - 145 mmol/L 136   140   137    Potassium 3.5 - 5.1 mmol/L 3.3   4.4   3.4    Chloride 98 - 111 mmol/L 103   100   103    CO2 22 - 32 mmol/L _1 Calcium 8.9 - 10.3 mg/dL 9.8   9.8   9.2      12-07-2021: 1st attempt left VM 12-10-2021: 2nd attempt left VM 12-11-2021: 3rd attempt left VM  Care Gaps: Covid booster overdue Yearly ophthalmology exam overdue AWV 10-03-2022  Star Rating Drugs: Atorvastatin 10 mg- Last filled 10-21-2021 [facility] Janumet 50-1000 mg- Last filled 11-04-2021 90 [facility] Losartan 25 mg- Last filled 10-08-2021 90 DS  [place] Clinical Pharmacist Assistant [phone]

## 2021-12-08 ENCOUNTER — Other Ambulatory Visit: Payer: Self-pay | Admitting: Nurse Practitioner

## 2021-12-25 ENCOUNTER — Other Ambulatory Visit: Payer: Self-pay | Admitting: Nurse Practitioner

## 2021-12-25 ENCOUNTER — Ambulatory Visit: Payer: MEDICARE | Admitting: Nurse Practitioner

## 2021-12-25 DIAGNOSIS — E119 Type 2 diabetes mellitus without complications: Secondary | ICD-10-CM

## 2021-12-25 DIAGNOSIS — K219 Gastro-esophageal reflux disease without esophagitis: Secondary | ICD-10-CM

## 2021-12-27 ENCOUNTER — Telehealth: Payer: Medicaid Other

## 2021-12-31 ENCOUNTER — Other Ambulatory Visit: Payer: Self-pay | Admitting: Nurse Practitioner

## 2021-12-31 ENCOUNTER — Other Ambulatory Visit: Payer: Self-pay

## 2021-12-31 DIAGNOSIS — I10 Essential (primary) hypertension: Secondary | ICD-10-CM

## 2021-12-31 DIAGNOSIS — E119 Type 2 diabetes mellitus without complications: Secondary | ICD-10-CM

## 2021-12-31 DIAGNOSIS — K219 Gastro-esophageal reflux disease without esophagitis: Secondary | ICD-10-CM

## 2021-12-31 MED ORDER — ATORVASTATIN CALCIUM 10 MG PO TABS: ORAL_TABLET | ORAL | 1 refills | Status: DC

## 2021-12-31 MED ORDER — ALBUTEROL SULFATE HFA 108 (90 BASE) MCG/ACT IN AERS: INHALATION_SPRAY | RESPIRATORY_TRACT | 1 refills | Status: DC

## 2021-12-31 MED ORDER — BD PEN NEEDLE NANO 2ND GEN 32G X 4 MM MISC: 1 refills | Status: DC

## 2021-12-31 MED ORDER — LOSARTAN POTASSIUM 25 MG PO TABS: 25.0000 mg | ORAL_TABLET | Freq: Every day | ORAL | 1 refills | Status: DC

## 2021-12-31 MED ORDER — HYDROCHLOROTHIAZIDE 12.5 MG PO TABS: ORAL_TABLET | ORAL | 1 refills | Status: DC

## 2021-12-31 MED ORDER — ATENOLOL 100 MG PO TABS: ORAL_TABLET | ORAL | 1 refills | Status: DC

## 2021-12-31 MED ORDER — GVOKE HYPOPEN 2-PACK 0.5 MG/0.1ML ~~LOC~~ SOAJ: 1.0000 | SUBCUTANEOUS | 2 refills | Status: DC | PRN

## 2021-12-31 MED ORDER — JANUMET 50-1000 MG PO TABS: ORAL_TABLET | ORAL | 1 refills | Status: DC

## 2021-12-31 MED ORDER — DEXLANSOPRAZOLE 60 MG PO CPDR: 60.0000 mg | DELAYED_RELEASE_CAPSULE | Freq: Every day | ORAL | 1 refills | Status: DC

## 2021-12-31 MED ORDER — MECLIZINE HCL 12.5 MG PO TABS
ORAL_TABLET | ORAL | 3 refills | Status: DC
Start: 2021-12-31 — End: 2022-05-21

## 2021-12-31 MED ORDER — SUCRALFATE 1 GM/10ML PO SUSP: 1.0000 g | Freq: Two times a day (BID) | ORAL | 1 refills | Status: DC

## 2022-01-06 ENCOUNTER — Other Ambulatory Visit: Payer: Self-pay | Admitting: Nurse Practitioner

## 2022-01-15 ENCOUNTER — Other Ambulatory Visit: Payer: Self-pay | Admitting: Nurse Practitioner

## 2022-01-18 ENCOUNTER — Telehealth: Payer: Self-pay

## 2022-01-18 ENCOUNTER — Other Ambulatory Visit: Payer: Self-pay

## 2022-01-18 DIAGNOSIS — J4530 Mild persistent asthma, uncomplicated: Secondary | ICD-10-CM

## 2022-01-18 NOTE — Telephone Encounter (Signed)
Patient called and she stated she does not need anymore in inhalers she has to many and cant get through them all. Patient states she wants a referral to an Pulmonary Provider.

## 2022-01-19 HISTORY — PX: COLONOSCOPY: SHX5424

## 2022-01-24 ENCOUNTER — Other Ambulatory Visit: Payer: Self-pay | Admitting: Nurse Practitioner

## 2022-01-30 ENCOUNTER — Other Ambulatory Visit: Payer: Self-pay | Admitting: Nurse Practitioner

## 2022-01-30 ENCOUNTER — Other Ambulatory Visit: Payer: Self-pay

## 2022-02-04 ENCOUNTER — Ambulatory Visit (INDEPENDENT_AMBULATORY_CARE_PROVIDER_SITE_OTHER): Payer: Medicare Other | Admitting: Nurse Practitioner

## 2022-02-04 ENCOUNTER — Encounter: Payer: Self-pay | Admitting: Nurse Practitioner

## 2022-02-04 VITALS — BP 118/64 | HR 87 | Temp 98.4°F | Ht 62.0 in | Wt 125.2 lb

## 2022-02-04 DIAGNOSIS — E559 Vitamin D deficiency, unspecified: Secondary | ICD-10-CM

## 2022-02-04 DIAGNOSIS — F250 Schizoaffective disorder, bipolar type: Secondary | ICD-10-CM

## 2022-02-04 DIAGNOSIS — E1141 Type 2 diabetes mellitus with diabetic mononeuropathy: Secondary | ICD-10-CM

## 2022-02-04 DIAGNOSIS — I10 Essential (primary) hypertension: Secondary | ICD-10-CM

## 2022-02-04 DIAGNOSIS — E782 Mixed hyperlipidemia: Secondary | ICD-10-CM

## 2022-02-04 NOTE — Progress Notes (Signed)
Barnet [place] Martin,acting as a Education administrator for [name], FNP.,have documented all relevant documentation on the behalf of [name], FNP,as directed by  [name], FNP while in the presence of [name], [place].    Subjective:     Patient ID: Linda Mercado , female    DOB: 24-Feb-1966 , 56 y.o.   MRN: [id number]   No chief complaint on file.   HPI  Patient is here today for a dm check. Patient has no other concerns. Patient also wants her ears flushed, she feels like   BP Readings from Last 3 Encounters: 02/04/22 : 118/64 10/11/21 : (!) 158/95 09/19/21 : (!) 110/58    Diabetes She presents for her follow-up diabetic visit. She has type 2 diabetes mellitus. There are no hypoglycemic associated symptoms. There are no diabetic associated symptoms. Risk factors for coronary artery disease include diabetes mellitus and sedentary lifestyle. Current diabetic treatment includes oral agent (dual therapy). Diabetic current diet: she is mostly eating fruit. When asked about meal planning, she reported none. She has not had a previous visit with a dietitian. She rarely participates in exercise. (Blood sugar ranging 64-145. )     Past Medical History:  Diagnosis Date   Acid reflux    Bradycardia    Chronic back pain    Diabetes mellitus without complication ([place])    "prediabetes", A1C down to 5 - reported on 10/11/21   Hypertension    Hypokalemia      Family History  Problem Relation Age of Onset   Kidney disease Mother    Hypertension Mother    Diabetes Father    Hypertension Father    Bipolar disorder Sister    Schizophrenia Sister    Neuropathy Sister    Hypertension Sister    Bipolar disorder Daughter    Post-traumatic stress disorder Daughter    Asthma Daughter    Depression Daughter    Bipolar disorder Daughter    Post-traumatic stress disorder Daughter    Asthma Daughter    Depression Daughter    Asthma Daughter    Depression Daughter    Asthma Daughter    Breast  cancer Paternal Grandmother    Bipolar disorder Brother    Bipolar disorder Brother    Schizophrenia Brother    Stroke Neg Hx    Cancer Neg Hx    CAD Neg Hx      Current Outpatient Medications:    albuterol (VENTOLIN HFA) 108 (90 Base) MCG/ACT inhaler, TAKE 2 PUFFS BY MOUTH EVERY 6 HOURS AS NEEDED FOR WHEEZE OR SHORTNESS OF BREATH, Disp: 20.1 each, Rfl: 1   Alcohol Swabs PADS, Use as directed to check blood sugars, Disp: 100 each, Rfl: 2   AMITIZA 24 MCG capsule, Take 24 mcg by mouth 2 (two) times daily. , Disp: , Rfl:    atenolol (TENORMIN) 100 MG tablet, TAKE 1 TABLET BY MOUTH EVERY DAY, Disp: 90 tablet, Rfl: 1   atorvastatin (LIPITOR) 10 MG tablet, TAKE 1 TABLET(10 MG) BY MOUTH AT BEDTIME, Disp: 90 tablet, Rfl: 1   Blood Glucose Monitoring Suppl (ONETOUCH VERIO) w/Device KIT, Use as directed to check 1 time per day dx: e11.65, Disp: 1 kit, Rfl: 1   calcium gluconate 500 MG tablet, Take 1 tablet (500 mg total) by mouth 3 (three) times daily., Disp: 270 tablet, Rfl: 1   CAPLYTA 42 MG CAPS, Take 42 mg by mouth at bedtime. , Disp: , Rfl:    Carbamazepine (EQUETRO) 300 MG CP12, Take  by mouth., Disp: , Rfl:    clonazePAM (KLONOPIN) 1 MG tablet, Take 1 mg by mouth 3 (three) times daily., Disp: , Rfl:    desvenlafaxine (PRISTIQ) 50 MG 24 hr tablet, Take by mouth., Disp: , Rfl:    dexlansoprazole (DEXILANT) 60 MG capsule, Take 1 capsule (60 mg total) by mouth daily. Patient will need appt for further refills, Disp: 90 capsule, Rfl: 1   diclofenac Sodium (VOLTAREN) 1 % GEL, APPLY 4 GRAMS TO THE AFFECTED AREA FOUR TIMES DAILY, Disp: 400 g, Rfl: 1   FLOVENT HFA 110 MCG/ACT inhaler, INHALE TWO PUFFS BY MOUTH INTO LUNGS DAILY (BULK), Disp: 12 g, Rfl: 1   fluconazole (DIFLUCAN) 100 MG tablet, Take 1 tablet (100 mg total) by mouth daily. Take 1 tablet by mouth now repeat in 5 days, Disp: 2 tablet, Rfl: 0   Glucagon (GVOKE HYPOPEN 2-PACK) 0.5 MG/0.1ML SOAJ, Inject 1 each into the skin as needed., Disp:  0.2 mL, Rfl: 2   glucose blood (ACCU-CHEK GUIDE) test strip, USE TO TEST TWICE DAILY AS DIRECTED, Disp: 100 strip, Rfl: 5   hydrochlorothiazide (HYDRODIURIL) 12.5 MG tablet, TAKE 1 TABLET (12.5 MG) BY MOUTH DAILY, Disp: 90 tablet, Rfl: 1   HYDROmorphone (DILAUDID) 4 MG tablet, Take 4 mg by mouth 4 (four) times daily as needed for severe pain., Disp: , Rfl:    hydrOXYzine (ATARAX/VISTARIL) 25 MG tablet, Take 50 mg by mouth in the morning, at noon, in the evening, and at bedtime., Disp: , Rfl:    Insulin Pen Needle (BD PEN NEEDLE NANO 2ND GEN) 32G X 4 MM MISC, USE AS DIRECTED WITH TRESIBA DAILY, Disp: 100 each, Rfl: 1   lidocaine (XYLOCAINE) 2 % solution, Use as directed 15 mLs in the mouth or throat 3 (three) times a week. , Disp: , Rfl:    Lidocaine 4 % PTCH, Place 1 patch onto the skin in the morning and at bedtime., Disp: 60 patch, Rfl: 5   LORazepam (ATIVAN) 2 MG tablet, Take 2 mg by mouth 4 (four) times daily as needed for anxiety. , Disp: , Rfl:    losartan (COZAAR) 25 MG tablet, Take 1 tablet (25 mg total) by mouth daily., Disp: 90 tablet, Rfl: 1   lubiprostone (AMITIZA) 24 MCG capsule, Take by mouth., Disp: , Rfl:    meclizine (ANTIVERT) 12.5 MG tablet, TAKE 1 TABLET BY MOUTH 3 TIMES DAILY AS NEEDED FOR DIZZINESS., Disp: 90 tablet, Rfl: 3   meloxicam (MOBIC) 15 MG tablet, Take 15 mg by mouth daily., Disp: , Rfl:    Multiple Vitamin (MULTIVITAMIN WITH MINERALS) TABS tablet, Take 1 tablet by mouth daily. , Disp: , Rfl:    NARCAN 4 MG/0.1ML LIQD nasal spray kit, Place 1 spray into the nose as directed., Disp: , Rfl: 0   ondansetron (ZOFRAN-ODT) 8 MG disintegrating tablet, Take 1 tablet (8 mg total) by mouth every 8 (eight) hours as needed for nausea or vomiting. 39m ODT q4 hours prn nausea, Disp: 20 tablet, Rfl: 0   OneTouch Delica Lancets 344RMISC, Use as directed to check 1 time per day dx: e11.65, Disp: 100 each, Rfl: 3   ONETOUCH VERIO test strip, Use as directed to check 1 time per day dx:  e11.65, Disp: 100 each, Rfl: 3   PAIN MANAGEMENT INTRATHECAL, IT, PUMP, 1 each by Intrathecal route continuous. Intrathecal (IT) medication: fentanyl, clonidine, bupivicaine  Fentanyl 2546mbupivicaine150mg/clonidine2mg  51.9768mer day, 2mg28mr day, 1.44mc24m.4ml/q26m, Disp: , Rfl:    polyethylene  glycol powder (GLYCOLAX/MIRALAX) 17 GM/SCOOP powder, MIX 17 GRAMS IN 8 OUNCES OF LIQUID AND DRINK DAILY FOR BOWELS, Disp: 238 g, Rfl: 1   QUEtiapine (SEROQUEL XR) 400 MG 24 hr tablet, Take 400 mg by mouth 2 (two) times daily., Disp: , Rfl:    QUEtiapine (SEROQUEL) 300 MG tablet, Take 300 mg by mouth at bedtime., Disp: , Rfl:    sucralfate (CARAFATE) 1 GM/10ML suspension, TAKE 10 ML BY MOUTH TWICE DAILY (BULK), Disp: 420 mL, Rfl: 1   tiZANidine (ZANAFLEX) 4 MG tablet, Take 4 mg by mouth every 4 (four) hours., Disp: , Rfl:    traZODone (DESYREL) 100 MG tablet, 5 qhs prescribed by psychiatry (Patient taking differently: Take 100-200 mg by mouth at bedtime. 5 qhs prescribed by psychiatry), Disp: 30 tablet, Rfl: 0   TRI-ESTARYLLA 0.18/0.215/0.25 MG-35 MCG tablet, TAKE 1 TABLET BY MOUTH EVERY DAY, Disp: 84 tablet, Rfl: 1   triamcinolone cream (KENALOG) 0.1 %, Apply 1 application topically daily as needed (for itching)., Disp: 30 g, Rfl: 2   TRINTELLIX 20 MG TABS tablet, Take 20 mg by mouth daily., Disp: , Rfl:    docusate sodium (COLACE) 50 MG capsule, Take 50 mg by mouth daily. , Disp: , Rfl:    Allergies  Allergen Reactions   Lisinopril Hives    Other reaction(s): anaphylaxis/angioedema   Latex Hives   Morphine And Related     Itching, skin get hot.    Adhesive [Tape] Rash     Review of Systems  Constitutional: Negative.   HENT: Negative.    Eyes: Negative.   Respiratory: Negative.    Cardiovascular: Negative.   Gastrointestinal: Negative.   Endocrine: Negative.   Genitourinary: Negative.   Musculoskeletal: Negative.   Skin: Negative.   Allergic/Immunologic: Negative.   Neurological:  Negative.   Hematological: Negative.   Psychiatric/Behavioral: Negative.       Today's Vitals   02/04/22 1643  BP: 118/64  Pulse: 87  Temp: 98.4 F (36.9 C)  TempSrc: Oral  Weight: 125 lb 3.2 oz (56.8 kg)  Height: 5' 2"  (1.575 m)  PainSc: 7   PainLoc: Back   Body mass index is 22.9 kg/m.  Wt Readings from Last 3 Encounters:  02/04/22 125 lb 3.2 oz (56.8 kg)  10/11/21 130 lb (59 kg)  09/19/21 135 lb (61.2 kg)     Objective:  Physical Exam Vitals reviewed.  Constitutional:      General: She is not in acute distress.    Appearance: Normal appearance. She is normal weight.  Cardiovascular:     Rate and Rhythm: Normal rate and regular rhythm.     Pulses: Normal pulses.     Heart sounds: Normal heart sounds. No murmur heard. Pulmonary:     Effort: Pulmonary effort is normal. No respiratory distress.     Breath sounds: Normal breath sounds. No wheezing.  Abdominal:     General: Abdomen is flat. Bowel sounds are normal. There is no distension.     Palpations: Abdomen is soft. There is no mass.     Tenderness: There is no abdominal tenderness.  Musculoskeletal:        General: Normal range of motion.     Comments: Using cane  Skin:    General: Skin is warm and dry.     Capillary Refill: Capillary refill takes less than 2 seconds.  Neurological:     General: No focal deficit present.     Mental Status: She is alert and oriented to person,  place, and time.     Cranial Nerves: No cranial nerve deficit.     Motor: No weakness.  Psychiatric:        Mood and Affect: Mood normal.        Behavior: Behavior normal.        Thought Content: Thought content normal.        Judgment: Judgment normal.         Assessment And Plan:     1. Type 2 diabetes mellitus with diabetic mononeuropathy, without long-term current use of insulin ([place]) Comments: Improving, continue Janumet. Reassured side effects include lactic acidosis if has diarrhea/vomiting or dehydrated.  - Renal  function panel with eGFR - Hemoglobin A1c - BMP8+eGFR  2. Essential hypertension Comments: Blood pressure is well controlled. Continue current medications - Urine microalbumin-creatinine with uACR - Comprehensive metabolic panel with eGFR ([facility])  3. Mixed hyperlipidemia Comments: Cholesterol levels are stable, continue current medications.   4. Vitamin D deficiency Will check vitamin D level and supplement as needed.    Also encouraged to spend 15 minutes in the sun daily.    Patient was given opportunity to ask questions. Patient verbalized understanding of the plan and was able to repeat key elements of the plan. All questions were answered to their satisfaction.  [name], FNP   I, [name], FNP, have reviewed all documentation for this visit. The documentation on 02/04/22 for the exam, diagnosis, procedures, and orders are all accurate and complete.   IF YOU HAVE BEEN REFERRED TO A SPECIALIST, IT MAY TAKE 1-2 WEEKS TO SCHEDULE/PROCESS THE REFERRAL. IF YOU HAVE NOT HEARD FROM US/SPECIALIST IN TWO WEEKS, PLEASE GIVE [place] A CALL AT [phone].   THE PATIENT IS ENCOURAGED TO PRACTICE SOCIAL DISTANCING DUE TO THE COVID-19 PANDEMIC.

## 2022-02-04 NOTE — Patient Instructions (Signed)

## 2022-02-04 NOTE — Addendum Note (Signed)
Addended by: [name] F on: 02/04/2022 05:49 PM   Modules accepted: Orders

## 2022-02-05 LAB — COMPREHENSIVE METABOLIC PANEL
ALT: 8 IU/L (ref 0–32)
AST: 11 IU/L (ref 0–40)
Albumin/Globulin Ratio: 1.5 (ref 1.2–2.2)
Albumin: 4.3 g/dL (ref 3.8–4.9)
Alkaline Phosphatase: 85 IU/L (ref 44–121)
BUN/Creatinine Ratio: 11 (ref 9–23)
BUN: 10 mg/dL (ref 6–24)
Bilirubin Total: 0.2 mg/dL (ref 0.0–1.2)
CO2: 21 mmol/L (ref 20–29)
Calcium: 9.1 mg/dL (ref 8.7–10.2)
Chloride: 97 mmol/L (ref 96–106)
Creatinine, Ser: 0.87 mg/dL (ref 0.57–1.00)
Globulin, Total: 2.8 g/dL (ref 1.5–4.5)
Glucose: 86 mg/dL (ref 70–99)
Potassium: 3.6 mmol/L (ref 3.5–5.2)
Sodium: 137 mmol/L (ref 134–144)
Total Protein: 7.1 g/dL (ref 6.0–8.5)
eGFR: 78 mL/min/{1.73_m2} (ref >59)

## 2022-02-05 LAB — MICROALBUMIN / CREATININE URINE RATIO
Creatinine, Urine: 182.2 mg/dL
Microalb/Creat Ratio: 17 mg/g creat (ref 0–29)
Microalbumin, Urine: 31.6 ug/mL

## 2022-02-05 LAB — BMP8+EGFR
BUN/Creatinine Ratio: 13 (ref 9–23)
BUN: 11 mg/dL (ref 6–24)
CO2: 21 mmol/L (ref 20–29)
Calcium: 9.1 mg/dL (ref 8.7–10.2)
Chloride: 100 mmol/L (ref 96–106)
Creatinine, Ser: 0.88 mg/dL (ref 0.57–1.00)
Glucose: 86 mg/dL (ref 70–99)
Potassium: 3.7 mmol/L (ref 3.5–5.2)
Sodium: 141 mmol/L (ref 134–144)
eGFR: 77 mL/min/{1.73_m2} (ref >59)

## 2022-02-05 LAB — HEMOGLOBIN A1C
Est. average glucose Bld gHb Est-mCnc: 111 mg/dL
Hgb A1c MFr Bld: 5.5 % (ref 4.8–5.6)

## 2022-02-05 LAB — RENAL FUNCTION PANEL
Albumin: 4.4 g/dL (ref 3.8–4.9)
Phosphorus: 4.1 mg/dL (ref 3.0–4.3)

## 2022-02-06 ENCOUNTER — Telehealth: Payer: Medicaid Other

## 2022-02-06 ENCOUNTER — Ambulatory Visit: Payer: Self-pay

## 2022-02-06 DIAGNOSIS — G894 Chronic pain syndrome: Secondary | ICD-10-CM

## 2022-02-06 DIAGNOSIS — K219 Gastro-esophageal reflux disease without esophagitis: Secondary | ICD-10-CM

## 2022-02-06 DIAGNOSIS — E063 Autoimmune thyroiditis: Secondary | ICD-10-CM

## 2022-02-06 DIAGNOSIS — E1141 Type 2 diabetes mellitus with diabetic mononeuropathy: Secondary | ICD-10-CM

## 2022-02-06 DIAGNOSIS — I10 Essential (primary) hypertension: Secondary | ICD-10-CM

## 2022-02-06 DIAGNOSIS — M5416 Radiculopathy, lumbar region: Secondary | ICD-10-CM

## 2022-02-06 NOTE — Chronic Care Management (AMB) (Signed)
Care Management    RN Visit Note  02/06/2022 Name: Linda Mercado MRN: [id number] DOB: 09-14-1965  Subjective: Linda Mercado is a 56 y.o. year old female who is a primary care patient of Linda Mercado, Linda Mercado. The care management team was consulted for assistance with disease management and care coordination needs.    Engaged with patient by telephone for follow up visit in response to provider referral for case management and/or care coordination services.   Consent to Services:   Linda Mercado was given information about Care Management services today including:  Care Management services includes personalized support from designated clinical staff supervised by her physician, including individualized plan of care and coordination with other care providers 24/7 contact phone numbers for assistance for urgent and routine care needs. The patient may stop case management services at any time by phone call to the office staff.  Patient agreed to services and consent obtained.   Assessment: Review of patient past medical history, allergies, medications, health status, including review of consultants reports, laboratory and other test data, was performed as part of comprehensive evaluation and provision of chronic care management services.   SDOH (Social Determinants of Health) assessments and interventions performed:  Yes, patient needs assistance with transportation, referral sent to Care Guide Resource team   Care Plan  Allergies  Allergen Reactions   Lisinopril Hives    Other reaction(s): anaphylaxis/angioedema   Latex Hives   Morphine And Related     Itching, skin get hot.    Adhesive [Tape] Rash    Outpatient Encounter Medications as of 02/06/2022  Medication Sig   albuterol (VENTOLIN HFA) 108 (90 Base) MCG/ACT inhaler TAKE 2 PUFFS BY MOUTH EVERY 6 HOURS AS NEEDED FOR WHEEZE OR SHORTNESS OF BREATH   Alcohol Swabs PADS Use as directed to check blood sugars   AMITIZA 24 MCG capsule  Take 24 mcg by mouth 2 (two) times daily.    atenolol (TENORMIN) 100 MG tablet TAKE 1 TABLET BY MOUTH EVERY DAY   atorvastatin (LIPITOR) 10 MG tablet TAKE 1 TABLET(10 MG) BY MOUTH AT BEDTIME   Blood Glucose Monitoring Suppl (ONETOUCH VERIO) w/Device KIT Use as directed to check 1 time per day dx: e11.65   calcium gluconate 500 MG tablet Take 1 tablet (500 mg total) by mouth 3 (three) times daily.   CAPLYTA 42 MG CAPS Take 42 mg by mouth at bedtime.    Carbamazepine (EQUETRO) 300 MG CP12 Take by mouth.   clonazePAM (KLONOPIN) 1 MG tablet Take 1 mg by mouth 3 (three) times daily.   desvenlafaxine (PRISTIQ) 50 MG 24 hr tablet Take by mouth.   dexlansoprazole (DEXILANT) 60 MG capsule Take 1 capsule (60 mg total) by mouth daily. Patient will need appt for further refills   diclofenac Sodium (VOLTAREN) 1 % GEL APPLY 4 GRAMS TO THE AFFECTED AREA FOUR TIMES DAILY   docusate sodium (COLACE) 50 MG capsule Take 50 mg by mouth daily.    FLOVENT HFA 110 MCG/ACT inhaler INHALE TWO PUFFS BY MOUTH INTO LUNGS DAILY (BULK)   fluconazole (DIFLUCAN) 100 MG tablet Take 1 tablet (100 mg total) by mouth daily. Take 1 tablet by mouth now repeat in 5 days   Glucagon (GVOKE HYPOPEN 2-PACK) 0.5 MG/0.1ML SOAJ Inject 1 each into the skin as needed.   glucose blood (ACCU-CHEK GUIDE) test strip USE TO TEST TWICE DAILY AS DIRECTED   hydrochlorothiazide (HYDRODIURIL) 12.5 MG tablet TAKE 1 TABLET (12.5 MG) BY MOUTH DAILY  HYDROmorphone (DILAUDID) 4 MG tablet Take 4 mg by mouth 4 (four) times daily as needed for severe pain.   hydrOXYzine (ATARAX/VISTARIL) 25 MG tablet Take 50 mg by mouth in the morning, at noon, in the evening, and at bedtime.   Insulin Pen Needle (BD PEN NEEDLE NANO 2ND GEN) 32G X 4 MM MISC USE AS DIRECTED WITH TRESIBA DAILY   lidocaine (XYLOCAINE) 2 % solution Use as directed 15 mLs in the mouth or throat 3 (three) times a week.    Lidocaine 4 % PTCH Place 1 patch onto the skin in the morning and at bedtime.    LORazepam (ATIVAN) 2 MG tablet Take 2 mg by mouth 4 (four) times daily as needed for anxiety.    losartan (COZAAR) 25 MG tablet Take 1 tablet (25 mg total) by mouth daily.   lubiprostone (AMITIZA) 24 MCG capsule Take by mouth.   meclizine (ANTIVERT) 12.5 MG tablet TAKE 1 TABLET BY MOUTH 3 TIMES DAILY AS NEEDED FOR DIZZINESS.   meloxicam (MOBIC) 15 MG tablet Take 15 mg by mouth daily.   Multiple Vitamin (MULTIVITAMIN WITH MINERALS) TABS tablet Take 1 tablet by mouth daily.    NARCAN 4 MG/0.1ML LIQD nasal spray kit Place 1 spray into the nose as directed.   ondansetron (ZOFRAN-ODT) 8 MG disintegrating tablet Take 1 tablet (8 mg total) by mouth every 8 (eight) hours as needed for nausea or vomiting. 64m ODT q4 hours prn nausea   OneTouch Delica Lancets 385UMISC Use as directed to check 1 time per day dx: e11.65   ONETOUCH VERIO test strip Use as directed to check 1 time per day dx: e11.65   PAIN MANAGEMENT INTRATHECAL, IT, PUMP 1 each by Intrathecal route continuous. Intrathecal (IT) medication: fentanyl, clonidine, bupivicaine  Fentanyl 2540mbupivicaine150mg/clonidine2mg  51.9749mer day, 2mg45mr day, 1.44mc71m.4ml/q65m   polyethylene glycol powder (GLYCOLAX/MIRALAX) 17 GM/SCOOP powder MIX 17 GRAMS IN 8 OUNCES OF LIQUID AND DRINK DAILY FOR BOWELS   QUEtiapine (SEROQUEL XR) 400 MG 24 hr tablet Take 400 mg by mouth 2 (two) times daily.   QUEtiapine (SEROQUEL) 300 MG tablet Take 300 mg by mouth at bedtime.   sucralfate (CARAFATE) 1 GM/10ML suspension TAKE 10 ML BY MOUTH TWICE DAILY (BULK)   tiZANidine (ZANAFLEX) 4 MG tablet Take 4 mg by mouth every 4 (four) hours.   traZODone (DESYREL) 100 MG tablet 5 qhs prescribed by psychiatry (Patient taking differently: Take 100-200 mg by mouth at bedtime. 5 qhs prescribed by psychiatry)   TRI-ESTARYLLA 0.18/0.215/0.25 MG-35 MCG tablet TAKE 1 TABLET BY MOUTH EVERY DAY   triamcinolone cream (KENALOG) 0.1 % Apply 1 application topically daily as needed (for  itching).   TRINTELLIX 20 MG TABS tablet Take 20 mg by mouth daily.   No facility-administered encounter medications on file as of 02/06/2022.    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   Hypokalemia 03/18/2018   Intractable back pain 01/03/2018   HTN (hypertension) 01/03/2018   Vitamin D  deficiency 07/11/2017   Postmenopausal 04/24/2017   Microalbuminuria 01/14/2017   Dyspareunia in female 12/10/2016   Endometriosis determined by laparoscopy 12/10/2016   History of exploratory laparotomy 12/10/2016   Pelvic pain 12/10/2016   Bipolar 1 disorder ([place]) 09/19/2016   Cyst of right ovary 09/19/2016   Severe episode of recurrent major depressive disorder, with psychotic features ([place]) 09/19/2016   Schizoaffective disorder, bipolar type ([place]) 09/19/2016   Chronic pain 03/05/2016   Non morbid obesity due to excess calories 03/05/2016   S/P lumbar fusion 03/05/2016    Conditions to be addressed/monitored:  Type 2 diabetes Mellitus, Essential hypertension, Chronic pain syndrome, Hashimoto's disease, Lumbar radiculopathy, GERD   Care Plan : RN Care Manager Plan of Care  Updates made by Lynne Macneal, RN since 02/06/2022 12:00 AM     Problem: No plan established for care established for management of chronic disease states (Type 2 diabetes Mellitus, Essential hypertension, Chronic pain syndrome, Hashimoto's disease, Lumbar radiculopathy, GERD)   Priority: High     Long-Range Goal: Development of  plan of care for chronic disease management for (Type 2 diabetes Mellitus, Essential hypertension, Chronic pain syndrome, Hashimoto's disease, Lumbar radiculopathy, GERD)   Start Date: 06/28/2021  Expected End Date: 06/28/2022  Recent Progress: On track  Priority: High  Note:   Current Barriers:  Knowledge Deficits related to plan of care for management of Type 2 diabetes Mellitus, Essential hypertension, Chronic pain syndrome, Hashimoto's disease, Lumbar radiculopathy, GERD   Chronic Disease Management support and education needs related to Type 2 diabetes Mellitus, Essential hypertension, Chronic pain syndrome, Hashimoto's disease, Lumbar radiculopathy, GERD   Lacks caregiver support Transportation barriers  RNCM Clinical Goal(s):  Patient will verbalize basic understanding of  Type 2 diabetes Mellitus, Essential hypertension, Chronic pain syndrome, Hashimoto's disease, Lumbar radiculopathy, GERD  disease process and self health management plan as evidenced by patient will report having no disease exacerbations related to her chronic disease states listed above take all medications exactly as prescribed and will call provider for medication related questions as evidenced by patient will report having no missed doses of her prescribed medications  demonstrate Improved health management independence as evidenced by patient will report 100% adherence to following her prescribed treatment plan continue to work with RN Care Manager to address care management and care coordination needs related to  Type 2 diabetes Mellitus, Essential hypertension, Chronic pain syndrome, Hashimoto's disease, Lumbar radiculopathy, GERD  as evidenced by adherence to CM Team Scheduled appointments through collaboration with RN Care manager, provider, and care team.   Interventions: 1:1 collaboration with primary care provider regarding development and update of comprehensive plan of care as evidenced by provider attestation  and co-signature Inter-disciplinary care team collaboration (see longitudinal plan of care) Evaluation of current treatment plan related to  self management and patient's adherence to plan as established by provider  Diabetes Interventions:  (Status:  Goal Met.) Long Term Goal Assessed patient's understanding of A1c goal: <6.5% Provided education to patient about basic DM disease process Review of patient status, including review of consultants reports, relevant laboratory and other test results, and medications completed Assessed social determinant of health barriers Encouraged patient to continue to adhere to a diabetic friendly diet  Lab Results  Component Value Date   HGBA1C 5.5 02/04/2022  Pain Interventions:  (Status:  Goal Met.) Long Term Goal Evaluation of current treatment plan related to Chronic Pain self-management and patient's adherence to plan as established by provider  Pain assessment performed Medications reviewed,  educated patient on potential SE related to chronic narcotic use including nausea, confirmed patient has Zofran on hand to use as directed  Reviewed provider established plan for pain management Discussed importance of adherence to all scheduled medical appointments Counseled on the importance of reporting any/all new or changed pain symptoms or management strategies to pain management provider Advised patient to report to care team affect of pain on daily activities Discussed use of relaxation techniques and/or diversional activities to assist with pain reduction (distraction, imagery, relaxation, massage, acupressure, TENS, heat, and cold application Reviewed with patient prescribed pharmacological and nonpharmacological pain relief strategies Screening for signs and symptoms of 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  Assessed social determinant of health barriers Discussed plans with patient for ongoing care management follow up and provided patient with  direct contact information for care management team  Falls Interventions:  (Status:  Goal on track:  NO.) Long Term Goal Reviewed medications and discussed potential side effects of medications such as dizziness, nausea and chronic fatigue Assessed for falls since last encounter, patient denies  Determined patient continues to have intermittent falls, last fall reported last evening in her home closet, she reports injuring her left leg but declines to seek medical attention Educated patient on fall prevention, determined patient often feels dizzy and this causes her to fall  Educated patient on precautionary measures due to taking narcotics for pain and the potential to have dizziness while taking these medications  Determined patient continues to have her young adult daughters home with her who help care for her and are available as needed Assessed for emergency alert system, confirmed patient has a first alert emergency alert necklace and attachment to her walker to access if needed   Nausea and Vomiting:  (Status:  Goal Met.)  Long Term Goal Evaluation of current treatment plan related to GERD, self-management and patient's adherence to plan as established by provider Review of patient status, including review of consultant's reports, relevant laboratory and other test results, and medications completed Determined patient's symptoms have resolved, she is maintaining her weight and voices having new new symptoms or concerns at this time  Reviewed medications with patient and discussed importance of medication adherence  Patient Goals/Self-Care Activities: Take all medications as prescribed Attend all scheduled provider appointments Call pharmacy for medication refills 3-7 days in advance of running out of medications Call provider office for new concerns or questions  drink 6 to 8 glasses of water each day fill half of plate with vegetables manage portion size Use the precautionary  measures to help avoid falls as discussed Report any/all falls to your PCP an or seek medical attention if needed You should receive a call from the Care Guide Resource to provide you with resources for transportation   Follow Up Plan:  patient transitioned to [place], RN, BSN, CCM Care Management Coordinator [place] [facility]  Direct Phone: [phone]

## 2022-02-06 NOTE — Patient Instructions (Signed)
Visit Information  Thank you for taking time to visit with me today. Please don't hesitate to contact me if I can be of assistance to you before our next scheduled telephone appointment.  Following are the goals we discussed today:  Take all medications as prescribed Attend all scheduled provider appointments Call pharmacy for medication refills 3-7 days in advance of running out of medications Call provider office for new concerns or questions  drink 6 to 8 glasses of water each day fill half of plate with vegetables manage portion size Use the precautionary measures to help avoid falls as discussed Report any/all falls to your PCP an or seek medical attention if needed You should receive a call from the Care Guide Resource to provide you with resources for transportation   Our next appointment is by telephone on 06/19/22 at 1:00 PM   Please call the care guide team at [phone] if you need to cancel or reschedule your appointment.   If you are experiencing a Mental Health or [place] or need someone to talk to, please call 1-800-273-TALK (toll free, 24 hour hotline)   Patient verbalizes understanding of instructions and care plan provided today and agrees to view in [place]. Active MyChart status and patient understanding of how to access instructions and care plan via MyChart confirmed with patient.     Barb Merino, RN, BSN, CCM Care Management Coordinator [place] [facility]  Direct Phone: [phone]

## 2022-02-06 NOTE — Addendum Note (Signed)
Addended by: Lynne Rodwell on: 02/06/2022 04:56 PM   Modules accepted: Orders

## 2022-02-07 ENCOUNTER — Telehealth: Payer: Self-pay

## 2022-02-07 NOTE — Telephone Encounter (Signed)
   Telephone encounter was:  Successful.  02/07/2022 Name: Linda Mercado MRN: [id number] DOB: Feb 27, 1966  Linda Mercado is a 56 y.o. year old female who is a primary care patient of [name], [place] . The community resource team was consulted for assistance with Transportation Needs   Care guide performed the following interventions: Spoke with patient, informed her that a [facility] caseworker would be contacting her to perform a medical assessment before her transportation can be scheduled.  Let patient know that she would be called in the next 24-48 hours no later than the end of day Monday 7/24 and to look for a [phone] number.  Follow Up Plan:  Care guide will follow up with patient by phone over the next 3 days.  Mical Kicklighter, AAS Paralegal, [place] [facility]  [address],  [address] ??[email or web address]  ?? [phone]   [email or web address]

## 2022-02-07 NOTE — Telephone Encounter (Signed)
   Telephone encounter was:  Successful.  02/07/2022 Name: Linda Mercado MRN: [id number] DOB: 10-08-1965  Linda Mercado is a 56 y.o. year old female who is a primary care patient of [name], [place] . The community resource team was consulted for assistance with Transportation Needs   Care guide performed the following interventions: Spoke with Anderson Malta at [facility] transportation. Patient will be contacted by a caseworker to perform a medical assessment in 24-48 hours no later than the end of day 02/11/22. After the assessment is completed patient can schedule transportation.  Follow Up Plan:   Patient will be contacted by a caseworker to perform a medical assessment in the next 24-48 hours no later than Monday 7/24.  Arbie Blankley, AAS Paralegal, [place] [facility]  [address], [place] [address] ??[email or web address]  ?? [phone]   [email or web address]

## 2022-02-11 ENCOUNTER — Other Ambulatory Visit: Payer: Self-pay

## 2022-02-11 ENCOUNTER — Telehealth: Payer: Self-pay

## 2022-02-11 DIAGNOSIS — K219 Gastro-esophageal reflux disease without esophagitis: Secondary | ICD-10-CM

## 2022-02-11 DIAGNOSIS — I10 Essential (primary) hypertension: Secondary | ICD-10-CM

## 2022-02-11 MED ORDER — DEXLANSOPRAZOLE 60 MG PO CPDR: 60.0000 mg | DELAYED_RELEASE_CAPSULE | Freq: Every day | ORAL | 1 refills | Status: DC

## 2022-02-11 MED ORDER — JANUMET 50-1000 MG PO TABS: 1.0000 | ORAL_TABLET | Freq: Two times a day (BID) | ORAL | 1 refills | Status: DC

## 2022-02-11 MED ORDER — LOSARTAN POTASSIUM 25 MG PO TABS: 25.0000 mg | ORAL_TABLET | Freq: Every day | ORAL | 1 refills | Status: DC

## 2022-02-11 MED ORDER — ATENOLOL 100 MG PO TABS: ORAL_TABLET | ORAL | 1 refills | Status: DC

## 2022-02-11 MED ORDER — HYDROCHLOROTHIAZIDE 12.5 MG PO TABS: ORAL_TABLET | ORAL | 1 refills | Status: DC

## 2022-02-11 NOTE — Telephone Encounter (Signed)
   Telephone encounter was:  Successful.  02/11/2022 Name: Linda Mercado MRN: [id number] DOB: 1966-02-17  Linda Mercado is a 56 y.o. year old female who is a primary care patient of [name], [place] . The community resource team was consulted for assistance with Transportation Needs   Care guide performed the following interventions: Follow up call placed to community resources to determine status of patients referral.  Follow Up Plan:  Spoke with Jimmie Molly at [facility], he will send a request to have patient called ASAP due to medical appointment scheduled for 02/18/22. I will call the patient to inform her.  Giovanni Bath, AAS Paralegal, [place] [facility]  [address], [place] [address] ??[email or web address]  ?? [phone]   [email or web address]

## 2022-02-11 NOTE — Telephone Encounter (Signed)
   Telephone encounter was:  Successful.  02/11/2022 Name: KURSTIN DIMARZO MRN: [id number] DOB: [birth year]  KRISTYANNA BARCELO is a 56 y.o. year old female who is a primary care patient of [name], [place] . The community resource team was consulted for assistance with Transportation Needs   Care guide performed the following interventions: Follow up call placed to the patient to discuss status of referral.  Follow Up Plan:  Spoke with patient to inform her I spoke with Jimmie Molly at [facility] and they should contact her no later that tomorrow end of day.   Rim Thatch, AAS Paralegal, [place] [facility]  [address], [place] [address] ??[email or web address]  ?? [phone]   [email or web address]

## 2022-02-11 NOTE — Telephone Encounter (Signed)
   Telephone encounter was:  Successful.  02/11/2022 Name: MADYLIN FAIRBANK MRN: [id number] DOB: [birth year]  KATHRINE RIEVES is a 56 y.o. year old female who is a primary care patient of [name], [place] . The community resource team was consulted for assistance with Transportation Needs   Care guide performed the following interventions: Spoke with patient she has not been contacted by [employer]. I let her know I would follow up with Medicaid Transportation today.  Follow Up Plan:   I will reach out Medicaid Transportation.  Coden Franchi, AAS Paralegal, [place] [facility]  [address], [place] [address] ??[email or web address]  ?? [phone]   [email or web address]

## 2022-02-13 ENCOUNTER — Telehealth: Payer: Self-pay

## 2022-02-13 NOTE — Telephone Encounter (Signed)
   Telephone encounter was:  Successful.  02/13/2022 Name: [name] MRN: [id number] DOB: 1966/02/11  Linda Mercado is a 56 y.o. year old female who is a primary care patient of [name], [place] . The community resource team was consulted for assistance with Transportation Needs   Care guide performed the following interventions: Follow up call placed to community resources to determine status of patients referral Follow up call placed to the patient to discuss status of referral.  Follow Up Plan:  Spoke with Linda Mercado at [facility].  Patient was contacted on 02/11/22 but there was no answer.  I requested that the patient be contacted again, Linda Mercado stated she would place patient into the workqueue and she would be contacted within 24-48 hours. Called patient to let her know to expect a call from [facility] Transportation the number prefix will be [phone].   Dominica Kent, AAS Paralegal, [place] [facility]  [address], [place] [address] ??[email or web address]  ?? [phone]   [email or web address]

## 2022-02-13 NOTE — Telephone Encounter (Signed)
   Telephone encounter was:  Successful.  02/13/2022 Name: Linda Mercado MRN: [id number] DOB: 11/27/1965  Linda Mercado is a 56 y.o. year old female who is a primary care patient of [name], [place] . The community resource team was consulted for assistance with Transportation Needs   Care guide performed the following interventions: Follow up call placed to the patient to discuss status of referral.  Follow Up Plan:  Spoke with patient about Medicaid Transportation, she stated that she has not received a call from a caseworker yet.   I will follow up with Medicaid today.   Emine Lopata, AAS Paralegal, [place] [facility]  [address], [place] [address] ??[email or web address]  ?? [phone]   [email or web address]

## 2022-02-14 ENCOUNTER — Telehealth: Payer: Self-pay

## 2022-02-14 NOTE — Telephone Encounter (Signed)
   Telephone encounter was:  Successful.  02/14/2022 Name: Linda Mercado MRN: [id number] DOB: 01-31-1966  Linda Mercado is a 56 y.o. year old female who is a primary care patient of [name], [place] . The community resource team was consulted for assistance with Transportation Needs   Care guide performed the following interventions: Spoke with patient, she has been contacted by a Medicaid transportation caseworker and her medical assessment has been completed.  Patient's transportation has been scheduled for her 02/18/22 appointment.   Follow Up Plan:  No further follow up planned at this time. The patient has been provided with needed resources.  Sujay Grundman, AAS Paralegal, [place] [facility]  [address], [place] [address] ??[email or web address]  ?? [phone]   [email or web address]

## 2022-02-18 ENCOUNTER — Institutional Professional Consult (permissible substitution): Payer: Medicare HMO | Admitting: Pulmonary Disease

## 2022-02-19 ENCOUNTER — Ambulatory Visit: Payer: Self-pay

## 2022-02-19 ENCOUNTER — Telehealth: Payer: Self-pay

## 2022-02-19 ENCOUNTER — Other Ambulatory Visit: Payer: Self-pay | Admitting: Nurse Practitioner

## 2022-02-19 MED ORDER — ONDANSETRON 8 MG PO TBDP: 8.0000 mg | ORAL_TABLET | Freq: Three times a day (TID) | ORAL | 0 refills | Status: AC | PRN

## 2022-02-19 NOTE — Patient Instructions (Signed)
Visit Information  Thank you for taking time to visit with me today. Please don't hesitate to contact me if I can be of assistance to you.   Following are the goals we discussed today:   Goals Addressed       Patient Stated     "I need medication for nausea and vomiting" (pt-stated)        Care Coordination Interventions: Determined patient is experiencing intermittent nausea and vomiting x 4 days Determined patient has recent history of H-pylori that was treated by PCP and evaluated by GI Discussed patient has a follow up scheduled with Dr. Benson Norway, gastroenterologist tomorrow am, 02/20/22 Educated patient regarding Peptic ulcer disease and encouraged patient to discuss with GI advised to eat small frequent meals advised to stay well hydrated to replace lost fluid to help prevent hypokalemia and or dehydration Advised to avoid foods that may trigger symptoms  Reviewed medications with patient, determined patient reports adherence to taking  her Dexilant as prescribed  Discussed patient has no refills remaining for her Ondansetron Sent in basket message to PCP provider [name] FNP requesting a refill for Ondansetron     Our next appointment is by telephone on 02/19/22 at 1:45 PM   Please call the care guide team at [phone] if you need to cancel or reschedule your appointment.   If you are experiencing a Mental Health or [place] or need someone to talk to, please call [phone]-TALK (toll free, 24 hour hotline)  Patient verbalizes understanding of instructions and care plan provided today and agrees to view in [place]. Active MyChart status and patient understanding of how to access instructions and care plan via MyChart confirmed with patient.     Barb Merino, RN, BSN, CCM Care Management Coordinator [facility] Care Management Direct Phone: [phone]

## 2022-02-19 NOTE — Chronic Care Management (AMB) (Signed)
Chronic Care Management Pharmacy Assistant   Name: Linda Mercado  MRN: [id number] DOB: 1965/07/28  Reason for Encounter: Disease State/ Hypertension   Recent office visits:  02-06-2022 Lynne Whiteman, RN (CCM)  02-04-2022 [name], [place]. Follow up visit no changes.  11-06-2021 Little, Claudette Stapler, RN (CCM)  10-15-2021 Lynne Palermo, RN (CCM)  09-21-2021 Rex Kras Claudette Stapler, RN (CCM)  09-19-2021 [name], [place]. MCH= 26.3, MCHC= 31.4. H. pylori, IgA Abs= 15.9. CHANGE janumet 50-1000 mg twice daily TO once daily.  09-19-2021 Kellie Simmering, LPN. Medicare wellness visit.  Recent consult visits:  01-14-2022 Birdie Hopes, Remo Lipps, ANP (Pain management). Intrathecal Pump analysis, refill and Reprogramming completed.  10-22-2021 Priddy Dondra Prader, ANP (Pain management). XR Spine Cervical 2-3 Views completed.   Hospital visits:  Medication Reconciliation was completed by comparing discharge summary, patient's EMR and Pharmacy list, and upon discussion with patient.   Admitted to the hospital on 10-11-2021 due to Chronic abdominal pain. Discharge date was 10-11-2021. Discharged from [place]?Medications Started at [facility] Discharge:?? Zofran 8 mg every 8 hours as needed   Medication Changes at Hospital Discharge: None   Medications Discontinued at Hospital Discharge: None   Medications that remain the same after Hospital Discharge:??  -All other medications will remain the same.    Medications: Outpatient Encounter Medications as of 02/19/2022  Medication Sig   albuterol (VENTOLIN HFA) 108 (90 Base) MCG/ACT inhaler TAKE 2 PUFFS BY MOUTH EVERY 6 HOURS AS NEEDED FOR WHEEZE OR SHORTNESS OF BREATH   Alcohol Swabs PADS Use as directed to check blood sugars   AMITIZA 24 MCG capsule Take 24 mcg by mouth 2 (two) times daily.    atenolol (TENORMIN) 100 MG tablet TAKE 1 TABLET BY MOUTH EVERY DAY   atorvastatin (LIPITOR) 10 MG tablet TAKE 1 TABLET(10  MG) BY MOUTH AT BEDTIME   Blood Glucose Monitoring Suppl (ONETOUCH VERIO) w/Device KIT Use as directed to check 1 time per day dx: e11.65   calcium gluconate 500 MG tablet Take 1 tablet (500 mg total) by mouth 3 (three) times daily.   CAPLYTA 42 MG CAPS Take 42 mg by mouth at bedtime.    Carbamazepine (EQUETRO) 300 MG CP12 Take by mouth.   clonazePAM (KLONOPIN) 1 MG tablet Take 1 mg by mouth 3 (three) times daily.   desvenlafaxine (PRISTIQ) 50 MG 24 hr tablet Take by mouth.   dexlansoprazole (DEXILANT) 60 MG capsule Take 1 capsule (60 mg total) by mouth daily. Patient will need appt for further refills   diclofenac Sodium (VOLTAREN) 1 % GEL APPLY 4 GRAMS TO THE AFFECTED AREA FOUR TIMES DAILY   docusate sodium (COLACE) 50 MG capsule Take 50 mg by mouth daily.    FLOVENT HFA 110 MCG/ACT inhaler INHALE TWO PUFFS BY MOUTH INTO LUNGS DAILY (BULK)   fluconazole (DIFLUCAN) 100 MG tablet Take 1 tablet (100 mg total) by mouth daily. Take 1 tablet by mouth now repeat in 5 days   Glucagon (GVOKE HYPOPEN 2-PACK) 0.5 MG/0.1ML SOAJ Inject 1 each into the skin as needed.   glucose blood (ACCU-CHEK GUIDE) test strip USE TO TEST TWICE DAILY AS DIRECTED   hydrochlorothiazide (HYDRODIURIL) 12.5 MG tablet TAKE 1 TABLET (12.5 MG) BY MOUTH DAILY   HYDROmorphone (DILAUDID) 4 MG tablet Take 4 mg by mouth 4 (four) times daily as needed for severe pain.   hydrOXYzine (ATARAX/VISTARIL) 25 MG tablet Take 50 mg by mouth in the morning, at  noon, in the evening, and at bedtime.   Insulin Pen Needle (BD PEN NEEDLE NANO 2ND GEN) 32G X 4 MM MISC USE AS DIRECTED WITH TRESIBA DAILY   lidocaine (XYLOCAINE) 2 % solution Use as directed 15 mLs in the mouth or throat 3 (three) times a week.    Lidocaine 4 % PTCH Place 1 patch onto the skin in the morning and at bedtime.   LORazepam (ATIVAN) 2 MG tablet Take 2 mg by mouth 4 (four) times daily as needed for anxiety.    losartan (COZAAR) 25 MG tablet Take 1 tablet (25 mg total) by mouth  daily.   lubiprostone (AMITIZA) 24 MCG capsule Take by mouth.   meclizine (ANTIVERT) 12.5 MG tablet TAKE 1 TABLET BY MOUTH 3 TIMES DAILY AS NEEDED FOR DIZZINESS.   meloxicam (MOBIC) 15 MG tablet Take 15 mg by mouth daily.   Multiple Vitamin (MULTIVITAMIN WITH MINERALS) TABS tablet Take 1 tablet by mouth daily.    NARCAN 4 MG/0.1ML LIQD nasal spray kit Place 1 spray into the nose as directed.   ondansetron (ZOFRAN-ODT) 8 MG disintegrating tablet Take 1 tablet (8 mg total) by mouth every 8 (eight) hours as needed for nausea or vomiting. 71m ODT q4 hours prn nausea   OneTouch Delica Lancets 341DMISC Use as directed to check 1 time per day dx: e11.65   ONETOUCH VERIO test strip Use as directed to check 1 time per day dx: e11.65   PAIN MANAGEMENT INTRATHECAL, IT, PUMP 1 each by Intrathecal route continuous. Intrathecal (IT) medication: fentanyl, clonidine, bupivicaine  Fentanyl 2555mbupivicaine150mg/clonidine2mg  51.9751mer day, 2mg52mr day, 1.44mc19m.4ml/q39m   polyethylene glycol powder (GLYCOLAX/MIRALAX) 17 GM/SCOOP powder MIX 17 GRAMS IN 8 OUNCES OF LIQUID AND DRINK DAILY FOR BOWELS   QUEtiapine (SEROQUEL XR) 400 MG 24 hr tablet Take 400 mg by mouth 2 (two) times daily.   QUEtiapine (SEROQUEL) 300 MG tablet Take 300 mg by mouth at bedtime.   sitaGLIPtin-metformin (JANUMET) 50-1000 MG tablet Take 1 tablet by mouth 2 (two) times daily with a meal.   sucralfate (CARAFATE) 1 GM/10ML suspension TAKE 10 ML BY MOUTH TWICE DAILY (BULK)   tiZANidine (ZANAFLEX) 4 MG tablet Take 4 mg by mouth every 4 (four) hours.   traZODone (DESYREL) 100 MG tablet 5 qhs prescribed by psychiatry (Patient taking differently: Take 100-200 mg by mouth at bedtime. 5 qhs prescribed by psychiatry)   TRI-ESTARYLLA 0.18/0.215/0.25 MG-35 MCG tablet TAKE 1 TABLET BY MOUTH EVERY DAY   triamcinolone cream (KENALOG) 0.1 % Apply 1 application topically daily as needed (for itching).   TRINTELLIX 20 MG TABS tablet Take 20 mg by  mouth daily.   No facility-administered encounter medications on file as of 02/19/2022.  Reviewed chart prior to disease state call. Spoke with patient regarding BP  Recent Office Vitals: BP Readings from Last 3 Encounters:  02/04/22 118/64  10/11/21 (!) 158/95  09/19/21 (!) 110/58   Pulse Readings from Last 3 Encounters:  02/04/22 87  10/11/21 80  09/19/21 81    Wt Readings from Last 3 Encounters:  02/04/22 125 lb 3.2 oz (56.8 kg)  10/11/21 130 lb (59 kg)  09/19/21 135 lb (61.2 kg)     Kidney Function Lab Results  Component Value Date/Time   CREATININE 0.88 02/04/2022 05:23 PM   CREATININE 0.87 02/04/2022 05:21 PM   GFRNONAA >60 10/11/2021 12:30 AM   GFRAA 60 08/24/2020 03:51 PM       Latest Ref Rng & Units 02/04/2022  5:23 PM 02/04/2022    5:21 PM 10/11/2021   12:30 AM  BMP  Glucose 70 - 99 mg/dL 86  86  143   BUN 6 - 24 mg/dL _0 Creatinine 0.57 - 1.00 mg/dL 0.88  0.87  0.81   BUN/Creat Ratio 9 - _1 Sodium 134 - 144 mmol/L 141  137  136   Potassium 3.5 - 5.2 mmol/L 3.7  3.6  3.3   Chloride 96 - 106 mmol/L 100  97  103   CO2 20 - 29 mmol/L _2 Calcium 8.7 - 10.2 mg/dL 9.1  9.1  9.8     Current antihypertensive regimen:  Losartan 50 mg daily Atenolol 100 mg daily HCTZ 12.5 mg daily  How often are you checking your Blood Pressure? 1-2x per week  Current home BP readings: 90/76 86, 127/70 81  What recent interventions/DTPs have been made by any provider to improve Blood Pressure control since last CPP Visit:  Educated on BP goals and benefits of medications for prevention of heart attack, stroke and kidney damage; Daily salt intake goal < 2300 mg; Exercise goal of 150 minutes per week; Importance of home blood pressure monitoring; Proper BP monitoring technique; -Counseled to monitor BP at home once per week, document, and provide log at future appointments -Recommended to continue current medication  Any recent  hospitalizations or ED visits since last visit with CPP? Yes  What diet changes have been made to improve Blood Pressure Control?  Patient stated she limits salt intake and drinks plenty of water.  What exercise is being done to improve your Blood Pressure Control?  Patient stated she cleans around the house daily.  Adherence Review: Is the patient currently on ACE/ARB medication? Yes Does the patient have >5 day gap between last estimated fill dates? No  Care Gaps: Covid booster overdue Yearly ophthalmology overdue Yearly foot exam overdue Flu vaccine overdue AWV 10-03-2022  Star Rating Drugs: Losartan 50 mg- Last filled 02-11-2022 90 DS [facility] Atorvastatin 10 mg- Last filled 12-31-2021 90 DS [facility] Janumet 50-1000 mg- Last filled 02-11-2022 90 DS [facility] Clinical Pharmacist Assistant [phone]

## 2022-02-19 NOTE — Patient Outreach (Signed)
  Care Coordination   Follow Up Visit Note   02/19/2022 Name: Linda Mercado MRN: [id number] DOB: June 27, 1966  Linda Mercado is a 56 y.o. year old female who sees [name], [place] for primary care. I spoke with  Linda Mercado by phone today  What matters to the patients health and wellness today?  Patient would like to report having symptoms of nausea and vomiting. She would like to get a refill for her Ondansetron.    Goals Addressed       Patient Stated     "I need medication for nausea and vomiting" (pt-stated)        Care Coordination Interventions: Determined patient is experiencing intermittent nausea and vomiting x 4 days Determined patient has recent history of H-pylori that was treated by PCP and evaluated by GI Discussed patient has a follow up scheduled with Dr. Benson Norway, gastroenterologist tomorrow am, 02/20/22 Educated patient regarding Peptic ulcer disease and encouraged patient to discuss with GI advised to eat small frequent meals advised to stay well hydrated to replace lost fluid to help prevent hypokalemia and or dehydration Advised to avoid foods that may trigger symptoms  Reviewed medications with patient, determined patient reports adherence to taking  her Dexilant as prescribed  Discussed patient has no refills remaining for her Ondansetron Sent in basket message to PCP provider [name] FNP requesting a refill for Ondansetron     SDOH assessments and interventions completed:   Yes  Care Coordination Interventions Activated:  Yes Care Coordination Interventions:  Yes, provided  Follow up plan: Follow up call scheduled for 02/19/22 '@1'$ :45 PM   Encounter Outcome:  Pt. Visit Completed

## 2022-02-27 NOTE — Progress Notes (Signed)
This encounter was created in error - please disregard.

## 2022-03-01 ENCOUNTER — Ambulatory Visit: Payer: Self-pay

## 2022-03-01 DIAGNOSIS — I10 Essential (primary) hypertension: Secondary | ICD-10-CM

## 2022-03-01 DIAGNOSIS — E1141 Type 2 diabetes mellitus with diabetic mononeuropathy: Secondary | ICD-10-CM

## 2022-03-01 NOTE — Chronic Care Management (AMB) (Signed)
Social Work Discipline Closure Note  03/01/2022 Name: Linda Mercado MRN: [id number] DOB: 13-Jun-1966  Linda Mercado is a 56 y.o. year old female who is a primary care patient of [name], [place].  The Care Management team was consulted for assistance with chronic disease management and care coordination needs.  Ms. Mode was given information about Care Management services today including:  Care Management services include personalized support from designated clinical staff supervised by her physician, including individualized plan of care and coordination with other care providers 24/7 contact phone numbers for assistance for urgent and routine care needs. The patient may stop care management services at any time (effective at the end of the month) by phone call to the office staff.  Patient agreed to services and consent obtained.   SW performed discipline closure.  Care Plan  Allergies  Allergen Reactions   Lisinopril Hives    Other reaction(s): anaphylaxis/angioedema   Latex Hives   Morphine And Related     Itching, skin get hot.    Adhesive [Tape] Rash    Outpatient Encounter Medications as of 03/01/2022  Medication Sig   albuterol (VENTOLIN HFA) 108 (90 Base) MCG/ACT inhaler TAKE 2 PUFFS BY MOUTH EVERY 6 HOURS AS NEEDED FOR WHEEZE OR SHORTNESS OF BREATH   Alcohol Swabs PADS Use as directed to check blood sugars   AMITIZA 24 MCG capsule Take 24 mcg by mouth 2 (two) times daily.    atenolol (TENORMIN) 100 MG tablet TAKE 1 TABLET BY MOUTH EVERY DAY   atorvastatin (LIPITOR) 10 MG tablet TAKE 1 TABLET(10 MG) BY MOUTH AT BEDTIME   Blood Glucose Monitoring Suppl (ONETOUCH VERIO) w/Device KIT Use as directed to check 1 time per day dx: e11.65   calcium gluconate 500 MG tablet Take 1 tablet (500 mg total) by mouth 3 (three) times daily.   CAPLYTA 42 MG CAPS Take 42 mg by mouth at bedtime.    Carbamazepine (EQUETRO) 300 MG CP12 Take by mouth.   clonazePAM (KLONOPIN) 1 MG tablet  Take 1 mg by mouth 3 (three) times daily.   desvenlafaxine (PRISTIQ) 50 MG 24 hr tablet Take by mouth.   dexlansoprazole (DEXILANT) 60 MG capsule Take 1 capsule (60 mg total) by mouth daily. Patient will need appt for further refills   diclofenac Sodium (VOLTAREN) 1 % GEL APPLY 4 GRAMS TO THE AFFECTED AREA FOUR TIMES DAILY   docusate sodium (COLACE) 50 MG capsule Take 50 mg by mouth daily.    FLOVENT HFA 110 MCG/ACT inhaler INHALE TWO PUFFS BY MOUTH INTO LUNGS DAILY (BULK)   fluconazole (DIFLUCAN) 100 MG tablet Take 1 tablet (100 mg total) by mouth daily. Take 1 tablet by mouth now repeat in 5 days   Glucagon (GVOKE HYPOPEN 2-PACK) 0.5 MG/0.1ML SOAJ Inject 1 each into the skin as needed.   glucose blood (ACCU-CHEK GUIDE) test strip USE TO TEST TWICE DAILY AS DIRECTED   hydrochlorothiazide (HYDRODIURIL) 12.5 MG tablet TAKE 1 TABLET (12.5 MG) BY MOUTH DAILY   HYDROmorphone (DILAUDID) 4 MG tablet Take 4 mg by mouth 4 (four) times daily as needed for severe pain.   hydrOXYzine (ATARAX/VISTARIL) 25 MG tablet Take 50 mg by mouth in the morning, at noon, in the evening, and at bedtime.   Insulin Pen Needle (BD PEN NEEDLE NANO 2ND GEN) 32G X 4 MM MISC USE AS DIRECTED WITH TRESIBA DAILY   lidocaine (XYLOCAINE) 2 % solution Use as directed 15 mLs in the mouth or throat 3 (  three) times a week.    Lidocaine 4 % PTCH Place 1 patch onto the skin in the morning and at bedtime.   LORazepam (ATIVAN) 2 MG tablet Take 2 mg by mouth 4 (four) times daily as needed for anxiety.    losartan (COZAAR) 25 MG tablet Take 1 tablet (25 mg total) by mouth daily.   lubiprostone (AMITIZA) 24 MCG capsule Take by mouth.   meclizine (ANTIVERT) 12.5 MG tablet TAKE 1 TABLET BY MOUTH 3 TIMES DAILY AS NEEDED FOR DIZZINESS.   meloxicam (MOBIC) 15 MG tablet Take 15 mg by mouth daily.   Multiple Vitamin (MULTIVITAMIN WITH MINERALS) TABS tablet Take 1 tablet by mouth daily.    NARCAN 4 MG/0.1ML LIQD nasal spray kit Place 1 spray into the  nose as directed.   ondansetron (ZOFRAN-ODT) 8 MG disintegrating tablet Take 1 tablet (8 mg total) by mouth every 8 (eight) hours as needed for nausea or vomiting. 30m ODT q4 hours prn nausea   OneTouch Delica Lancets 366AMISC Use as directed to check 1 time per day dx: e11.65   ONETOUCH VERIO test strip Use as directed to check 1 time per day dx: e11.65   PAIN MANAGEMENT INTRATHECAL, IT, PUMP 1 each by Intrathecal route continuous. Intrathecal (IT) medication: fentanyl, clonidine, bupivicaine  Fentanyl 2547mbupivicaine150mg/clonidine2mg  51.97101mer day, 2mg27mr day, 1.44mc7m.4ml/q84m   polyethylene glycol powder (GLYCOLAX/MIRALAX) 17 GM/SCOOP powder MIX 17 GRAMS IN 8 OUNCES OF LIQUID AND DRINK DAILY FOR BOWELS   QUEtiapine (SEROQUEL XR) 400 MG 24 hr tablet Take 400 mg by mouth 2 (two) times daily.   QUEtiapine (SEROQUEL) 300 MG tablet Take 300 mg by mouth at bedtime.   sitaGLIPtin-metformin (JANUMET) 50-1000 MG tablet Take 1 tablet by mouth 2 (two) times daily with a meal.   sucralfate (CARAFATE) 1 GM/10ML suspension TAKE 10 ML BY MOUTH TWICE DAILY (BULK)   tiZANidine (ZANAFLEX) 4 MG tablet Take 4 mg by mouth every 4 (four) hours.   traZODone (DESYREL) 100 MG tablet 5 qhs prescribed by psychiatry (Patient taking differently: Take 100-200 mg by mouth at bedtime. 5 qhs prescribed by psychiatry)   TRI-ESTARYLLA 0.18/0.215/0.25 MG-35 MCG tablet TAKE 1 TABLET BY MOUTH EVERY DAY   triamcinolone cream (KENALOG) 0.1 % Apply 1 application topically daily as needed (for itching).   TRINTELLIX 20 MG TABS tablet Take 20 mg by mouth daily.   No facility-administered encounter medications on file as of 03/01/2022.    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   Hypokalemia 03/18/2018   Intractable back pain 01/03/2018   HTN (hypertension) 01/03/2018   Vitamin D deficiency 07/11/2017   Postmenopausal 04/24/2017   Microalbuminuria 01/14/2017   Dyspareunia in female 12/10/2016   Endometriosis determined by laparoscopy 12/10/2016   History of exploratory laparotomy 12/10/2016   Pelvic pain 12/10/2016   Bipolar 1 disorder (HCC) 0[place]1/2018   Cyst of right ovary 09/19/2016   Severe episode of recurrent major depressive disorder, with psychotic features (HCC) 0[place]1/2018  Schizoaffective disorder, bipolar type ([place]) 09/19/2016   Chronic pain 03/05/2016   Non morbid obesity due to excess calories 03/05/2016   S/P lumbar fusion 03/05/2016    Conditions to be addressed/monitored: HTN and DMII; Transportation  Care Plan : Social Work Plan of Care  Updates made by Daneen Schick since 03/01/2022 12:00 AM  Completed 03/01/2022   Problem: Mobility and Independence Resolved 03/01/2022     Goal: Identify Transportation Resources Completed 03/01/2022  Start Date: 06/28/2021  Priority: High  Note:   Current Barriers:  Chronic disease management support and education needs related to  DM II, HTN, Hashimoto's Disease   Systems analyst Clinical Goal(s):  patient will work with SW to identify and address any acute and/or chronic care coordination needs related to the self health management of HTN, DM, and Hashimoto's Disease   explore community resource options for unmet needs related to:  Transportation SW Interventions:  Inter-disciplinary care team collaboration (see longitudinal plan of care) Collaboration with [name], FNP regarding development and update of comprehensive plan of care  as evidenced by provider attestation and co-signature Discipline closure performed due to inability to keep patient engaged with SW        Follow Up Plan:  No SW follow up planned. The patient will remain engaged with RN Care Manager.      Daneen Schick, BSW, CDP Social Worker, Certified Dementia Practitioner Care Coordination [phone]

## 2022-03-14 ENCOUNTER — Other Ambulatory Visit: Payer: Self-pay | Admitting: Nurse Practitioner

## 2022-04-01 ENCOUNTER — Institutional Professional Consult (permissible substitution): Payer: Medicare HMO | Admitting: Pulmonary Disease

## 2022-04-22 ENCOUNTER — Telehealth: Payer: Self-pay

## 2022-04-22 NOTE — Chronic Care Management (AMB) (Signed)
    Called Linda Mercado, No answer, left message of appointment on 04-24-2022 at 3:00 via telephone visit with Orlando Penner, Pharm D. Notified to have all medications, supplements, blood pressure and/or blood sugar logs available during appointment and to return call if need to reschedule.   Care Gaps: Covid booster overdue Yearly ophthalmology overdue Yearly foot exam overdue Flu vaccine overdue AWV 10-03-2022  Star Rating Drug: Losartan 50 mg- Last filled 04-09-2022 30 DS [facility] Atorvastatin 10 mg- Last filled 04-09-2022 30 DS [facility] Janumet 50-1000 mg- Last filled 04-09-2022 30 DS [facility]    Any gaps in medications fill history? No   [facility] Pharmacist Assistant [phone]

## 2022-04-24 ENCOUNTER — Telehealth: Payer: Self-pay

## 2022-04-24 ENCOUNTER — Telehealth: Payer: Medicaid Other

## 2022-04-24 NOTE — Telephone Encounter (Signed)
  Care Management   Follow Up Note   04/24/2022 Name: Linda Mercado MRN: [id number] DOB: 1966-03-22   Referred by: [name], FNP Reason for referral : No chief complaint on file.   An unsuccessful telephone outreach was attempted today. The patient was referred to the case management team for assistance with care management and care coordination.  Patient reports that she fell a couple days ago. Collaborate with PCP team for patient to be seen.    Follow Up Plan: The patient has been provided with contact information for the care management team and has been advised to call with any health related questions or concerns.   Orlando Penner, CPP, PharmD Clinical Pharmacist Practitioner [facility] [phone]

## 2022-04-24 NOTE — Telephone Encounter (Signed)
Called patient to check on her about her fall and hospital visit, also called about stomach issue she wanted a second opinion on.

## 2022-05-20 ENCOUNTER — Other Ambulatory Visit: Payer: Self-pay | Admitting: Nurse Practitioner

## 2022-05-22 ENCOUNTER — Other Ambulatory Visit: Payer: Self-pay | Admitting: Nurse Practitioner

## 2022-05-23 ENCOUNTER — Other Ambulatory Visit: Payer: Self-pay | Admitting: Nurse Practitioner

## 2022-06-17 ENCOUNTER — Ambulatory Visit: Payer: Medicaid Other | Admitting: Nurse Practitioner

## 2022-06-17 ENCOUNTER — Other Ambulatory Visit: Payer: Self-pay

## 2022-06-17 MED ORDER — ALBUTEROL SULFATE HFA 108 (90 BASE) MCG/ACT IN AERS: INHALATION_SPRAY | RESPIRATORY_TRACT | 1 refills | Status: DC

## 2022-06-19 ENCOUNTER — Other Ambulatory Visit: Payer: Self-pay | Admitting: Nurse Practitioner

## 2022-06-19 ENCOUNTER — Ambulatory Visit: Payer: Self-pay

## 2022-06-19 ENCOUNTER — Ambulatory Visit: Payer: Medicaid Other | Admitting: Nurse Practitioner

## 2022-06-19 NOTE — Patient Outreach (Signed)
  Care Coordination   06/19/2022 Name: CHARMAN BLASCO MRN: [id number] DOB: 1966-02-28   Care Coordination Outreach Attempts:  An unsuccessful telephone outreach was attempted for a scheduled appointment today.  Follow Up Plan:  Additional outreach attempts will be made to offer the patient care coordination information and services.   Encounter Outcome:  Pt. Request to Call Back   Care Coordination Interventions:  No, not indicated    Barb Merino, RN, BSN, CCM Care Management Coordinator [facility]  Direct Phone: [phone]

## 2022-06-21 ENCOUNTER — Ambulatory Visit: Payer: Self-pay

## 2022-06-21 NOTE — Patient Instructions (Signed)
Visit Information  Thank you for taking time to visit with me today. Please don't hesitate to contact me if I can be of assistance to you.   Following are the goals we discussed today:   Goals Addressed               This Visit's Progress     Patient Stated     "I need medication for nausea and vomiting" (pt-stated)        Care Coordination Interventions: Evaluation of current treatment plan related to H. Pylori  and patient's adherence to plan as established by provider Determined patient completed a follow up with Dr. Benson Norway for evaluation of nausea/vomiting Discussed patient was referred to a new specialist by Dr. Benson Norway for further evaluation of these symptom, H. Pylori was negative at last follow up Discussed patient is scheduled to f/u with the new Specialist in March 2024 Reinforced to patient the importance of keeping all scheduled medical appointments and to inform her doctor of any new or worsening symptoms          I had another fall (pt-stated)        Care Coordination Interventions: Provided written and verbal education re: potential causes of falls and Fall prevention strategies Assessed for falls since last encounter Assessed patients knowledge of fall risk prevention secondary to previously provided education Screening for signs and symptoms of 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  Referral sent to Christa See LCSW for assistance with counseling for 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         Other     "I would like for my neck to feel better"        Care Coordination Interventions: Reviewed provider established plan for pain management Discussed patient underwent a LEFT C3-5 Medial Branch Nerve Blocks (#1/2)- Diagnostic Block  Determined patient will undergo a RIGHT C3-5 Medial Branch Nerve Block in 2 weeks Discussed patient will undergo a new pain pump replacement in the early part of next year around April 2024 Discussed patient verbalizes  understanding of her prescribed treatment plan         Our next appointment is by telephone on 08/23/22 at 1030 AM  Please call the care guide team at [phone] if you need to cancel or reschedule your appointment.   If you are experiencing a Mental Health or [place] or need someone to talk to, please call 1-800-273-TALK (toll free, 24 hour hotline)  Patient verbalizes understanding of instructions and care plan provided today and agrees to view in [place]. Active MyChart status and patient understanding of how to access instructions and care plan via MyChart confirmed with patient.     Barb Merino, RN, BSN, CCM Care Management Coordinator [facility] Care Management Direct Phone: [phone]

## 2022-06-21 NOTE — Patient Outreach (Signed)
  Care Coordination   Follow Up Visit Note   06/21/2022 Name: Linda Mercado MRN: [id number] DOB: 1965/09/30  Linda Mercado is a 56 y.o. year old female who sees [name], [place] for primary care. I spoke with  [name] by phone today.  What matters to the patients health and wellness today?  Patient would like to get her pain under better control. She would like to f/u with a GI specialist to evaluate GI chronic symptoms.     Goals Addressed               This Visit's Progress     Patient Stated     "I need medication for nausea and vomiting" (pt-stated)        Care Coordination Interventions: Evaluation of current treatment plan related to H. Pylori  and patient's adherence to plan as established by provider Determined patient completed a follow up with Dr. Benson Norway for evaluation of nausea/vomiting Discussed patient was referred to a new specialist by Dr. Benson Norway for further evaluation of these symptom, H. Pylori was negative at last follow up Discussed patient is scheduled to f/u with the new Specialist in March 2024 Reinforced to patient the importance of keeping all scheduled medical appointments and to inform her doctor of any new or worsening symptoms          I had another fall (pt-stated)        Care Coordination Interventions: Provided written and verbal education re: potential causes of falls and Fall prevention strategies Assessed for falls since last encounter Assessed patients knowledge of fall risk prevention secondary to previously provided education Screening for signs and symptoms of 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  Referral sent to Christa See LCSW for assistance with counseling for 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         Other     "I would like for my neck to feel better"        Care Coordination Interventions: Reviewed provider established plan for pain management Discussed patient underwent a LEFT C3-5 Medial Branch  Nerve Blocks (#1/2)- Diagnostic Block  Determined patient will undergo a RIGHT C3-5 Medial Branch Nerve Block in 2 weeks Discussed patient will undergo a new pain pump replacement in the early part of next year around April 2024 Discussed patient verbalizes understanding of her prescribed treatment plan         SDOH assessments and interventions completed:  Yes     Care Coordination Interventions:  Yes, provided   Follow up plan: Referral made to [place] for counseling for 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 Follow up call scheduled for 08/23/22 '@1030'$  AM    Encounter Outcome:  Pt. Visit Completed

## 2022-06-25 ENCOUNTER — Telehealth: Payer: Self-pay | Admitting: Licensed Clinical Social Worker

## 2022-06-25 NOTE — Patient Outreach (Signed)
  Care Coordination   06/25/2022 Name: Linda Mercado MRN: [id number] DOB: 03/17/1966   Care Coordination Outreach Attempts: LCSW received internal referral from [facility]. An unsuccessful telephone outreach was attempted today to offer the patient information about available care coordination services as a benefit of their health plan.   Follow Up Plan:  Additional outreach attempts will be made to offer the patient care coordination information and services.   Encounter Outcome:  No Answer   Care Coordination Interventions:  No, not indicated    Christa See, MSW, [email or web address] Phone [phone] 4:16 PM

## 2022-07-09 ENCOUNTER — Telehealth: Payer: Self-pay | Admitting: Licensed Clinical Social Worker

## 2022-07-09 NOTE — Patient Outreach (Signed)
  Care Coordination   07/09/2022 Name: Linda Mercado MRN: [id number] DOB: [birth year]   Care Coordination Outreach Attempts:  A second unsuccessful outreach was attempted today to offer the patient with information about available care coordination services as a benefit of their health plan.     Follow Up Plan:  Additional outreach attempts will be made to offer the patient care coordination information and services.   Encounter Outcome:  No Answer   Care Coordination Interventions:  No, not indicated    Christa See, MSW, [email or web address] Phone [phone] 3:29 PM

## 2022-07-30 NOTE — Telephone Encounter (Signed)
Chmg-error.  

## 2022-08-13 ENCOUNTER — Telehealth: Payer: Self-pay | Admitting: *Deleted

## 2022-08-13 ENCOUNTER — Other Ambulatory Visit: Payer: Self-pay | Admitting: Nurse Practitioner

## 2022-08-13 DIAGNOSIS — K219 Gastro-esophageal reflux disease without esophagitis: Secondary | ICD-10-CM

## 2022-08-13 NOTE — Progress Notes (Signed)
  Care Coordination Note  08/13/2022 Name: Linda Mercado MRN: [id number] DOB: 1965/12/04  Linda Mercado is a 57 y.o. year old female who is a primary care patient of [name], [place] and is actively engaged with the care management team. I reached out to [name] by phone today to assist with re-scheduling a follow up visit with the RN Case Manager  Follow up plan: Telephone appointment with care management team member scheduled for:08/30/22  Patient ask for information for GI specialist in [place]. Gave information to patient.   Plan of Treatment - Upcoming Encounters Upcoming Encounters Date Type Department Care Team Description 10/11/2022 8:00 AM EDT Office Visit [facility] GI MEDICINE [place]  [address]  [facility] [address] through [address] [place] [address]  [phone] Rosana Hoes, [place], [place] [address]  [phone] (Work)  [phone] (Fax)    [place]  Direct Dial: [phone]

## 2022-08-15 ENCOUNTER — Telehealth: Payer: Self-pay | Admitting: Licensed Clinical Social Worker

## 2022-08-15 ENCOUNTER — Other Ambulatory Visit: Payer: Self-pay | Admitting: Nurse Practitioner

## 2022-08-15 DIAGNOSIS — I10 Essential (primary) hypertension: Secondary | ICD-10-CM

## 2022-08-15 NOTE — Patient Outreach (Signed)
  Care Coordination   Initial Visit Note   08/15/2022 Name: Linda Mercado MRN: [id number] DOB: 12-15-1965  Linda Mercado is a 57 y.o. year old female who sees Linda Mercado, Linda Mercado for primary care. I spoke with  Linda Mercado by phone today.  What matters to the patients health and wellness today?  Care Coordination    Goals Addressed             This Visit's Progress    Obtain Supportive Resources/Management of MH Conditions   On track    Care Coordination Interventions: Solution-Focused Strategies employed:  Active listening / Reflection utilized  Emotional Support Provided Verbalization of feelings encouraged  LCSW informed patient of care coordination services. Pt is interested at this time and agreed to schedule initial appt with LCSW         SDOH assessments and interventions completed:  No     Care Coordination Interventions:  Yes, provided   Follow up plan: Follow up call scheduled for 1 week    Encounter Outcome:  Pt. Visit Completed   Linda Mercado, MSW, [email or web address] Phone [phone] 6:16 PM

## 2022-08-15 NOTE — Patient Instructions (Signed)
Visit Information  Thank you for taking time to visit with me today. Please don't hesitate to contact me if I can be of assistance to you.   Following are the goals we discussed today:   Goals Addressed             This Visit's Progress    Obtain Supportive Resources/Management of MH Conditions   On track    Care Coordination Interventions: Solution-Focused Strategies employed:  Active listening / Reflection utilized  Emotional Support Provided Verbalization of feelings encouraged  LCSW informed patient of care coordination services. Pt is interested at this time and agreed to schedule initial appt with LCSW         Our next appointment is by telephone on 08/20/22 at 3:30 PM  Please call the care guide team at [phone] if you need to cancel or reschedule your appointment.   If you are experiencing a Mental Health or [place] or need someone to talk to, please call the Suicide and Crisis Lifeline: 988 call 911   Patient verbalizes understanding of instructions and care plan provided today and agrees to view in [place]. Active MyChart status and patient understanding of how to access instructions and care plan via MyChart confirmed with patient.     Christa See, MSW, [email or web address] Phone [phone] 6:18 PM

## 2022-08-20 ENCOUNTER — Encounter: Payer: Self-pay | Admitting: Nurse Practitioner

## 2022-08-20 ENCOUNTER — Encounter: Payer: Self-pay | Admitting: Licensed Clinical Social Worker

## 2022-08-20 ENCOUNTER — Ambulatory Visit (INDEPENDENT_AMBULATORY_CARE_PROVIDER_SITE_OTHER): Payer: 59 | Admitting: Nurse Practitioner

## 2022-08-20 VITALS — BP 132/88 | HR 82 | Temp 98.1°F | Ht 62.0 in | Wt 120.4 lb

## 2022-08-20 DIAGNOSIS — Z79899 Other long term (current) drug therapy: Secondary | ICD-10-CM

## 2022-08-20 DIAGNOSIS — I10 Essential (primary) hypertension: Secondary | ICD-10-CM

## 2022-08-20 DIAGNOSIS — F250 Schizoaffective disorder, bipolar type: Secondary | ICD-10-CM | POA: Diagnosis not present

## 2022-08-20 DIAGNOSIS — E1141 Type 2 diabetes mellitus with diabetic mononeuropathy: Secondary | ICD-10-CM | POA: Diagnosis not present

## 2022-08-20 DIAGNOSIS — R112 Nausea with vomiting, unspecified: Secondary | ICD-10-CM

## 2022-08-20 DIAGNOSIS — R6889 Other general symptoms and signs: Secondary | ICD-10-CM

## 2022-08-20 MED ORDER — NORGESTIM-ETH ESTRAD TRIPHASIC 0.18/0.215/0.25 MG-35 MCG PO TABS: 1.0000 | ORAL_TABLET | Freq: Every day | ORAL | 1 refills | Status: DC

## 2022-08-20 MED ORDER — POLYETHYLENE GLYCOL 3350 17 GM/SCOOP PO POWD: ORAL | 1 refills | Status: DC

## 2022-08-20 NOTE — Patient Instructions (Signed)
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
## 2022-08-21 LAB — CBC
Hematocrit: 41.6 % (ref 34.0–46.6)
Hemoglobin: 13.1 g/dL (ref 11.1–15.9)
MCH: 26.7 pg (ref 26.6–33.0)
MCHC: 31.5 g/dL (ref 31.5–35.7)
MCV: 85 fL (ref 79–97)
Platelets: 390 10*3/uL (ref 150–450)
RBC: 4.91 x10E6/uL (ref 3.77–5.28)
RDW: 13.9 % (ref 11.7–15.4)
WBC: 6 10*3/uL (ref 3.4–10.8)

## 2022-08-21 LAB — BMP8+EGFR
BUN/Creatinine Ratio: 11 (ref 9–23)
BUN: 11 mg/dL (ref 6–24)
CO2: 25 mmol/L (ref 20–29)
Calcium: 9.4 mg/dL (ref 8.7–10.2)
Chloride: 99 mmol/L (ref 96–106)
Creatinine, Ser: 0.98 mg/dL (ref 0.57–1.00)
Glucose: 82 mg/dL (ref 70–99)
Potassium: 3.9 mmol/L (ref 3.5–5.2)
Sodium: 140 mmol/L (ref 134–144)
eGFR: 68 mL/min/{1.73_m2} (ref >59)

## 2022-08-21 LAB — HEMOGLOBIN A1C
Est. average glucose Bld gHb Est-mCnc: 111 mg/dL
Hgb A1c MFr Bld: 5.5 % (ref 4.8–5.6)

## 2022-08-23 ENCOUNTER — Telehealth: Payer: Self-pay | Admitting: Licensed Clinical Social Worker

## 2022-08-23 NOTE — Patient Outreach (Signed)
  Care Coordination Late Entry   Outreach completed on 08/20/22 Name: Linda Mercado MRN: [id number] DOB: 1965/10/17   Care Coordination Outreach Attempts:  An unsuccessful telephone outreach was attempted for a scheduled appointment today.  Follow Up Plan:  Additional outreach attempts will be made to offer the patient care coordination information and services.   Encounter Outcome:  No Answer   Care Coordination Interventions:  No, not indicated    Christa See, MSW, [email or web address] Phone [phone] 5:19 AM

## 2022-08-26 ENCOUNTER — Other Ambulatory Visit: Payer: Self-pay

## 2022-08-26 ENCOUNTER — Emergency Department (HOSPITAL_COMMUNITY)
Admission: EM | Admit: 2022-08-26 | Discharge: 2022-08-27 | Disposition: A | Discharge status: Home / Self Care | Payer: 59 | Attending: Emergency Medicine | Admitting: Emergency Medicine

## 2022-08-26 ENCOUNTER — Emergency Department (HOSPITAL_COMMUNITY): Payer: 59

## 2022-08-26 DIAGNOSIS — Y92410 Unspecified street and highway as the place of occurrence of the external cause: Secondary | ICD-10-CM | POA: Insufficient documentation

## 2022-08-26 DIAGNOSIS — E119 Type 2 diabetes mellitus without complications: Secondary | ICD-10-CM | POA: Insufficient documentation

## 2022-08-26 DIAGNOSIS — M545 Low back pain, unspecified: Secondary | ICD-10-CM | POA: Insufficient documentation

## 2022-08-26 DIAGNOSIS — I10 Essential (primary) hypertension: Secondary | ICD-10-CM | POA: Insufficient documentation

## 2022-08-26 DIAGNOSIS — R519 Headache, unspecified: Secondary | ICD-10-CM | POA: Diagnosis not present

## 2022-08-26 DIAGNOSIS — G8929 Other chronic pain: Secondary | ICD-10-CM | POA: Diagnosis not present

## 2022-08-26 DIAGNOSIS — Z79899 Other long term (current) drug therapy: Secondary | ICD-10-CM | POA: Insufficient documentation

## 2022-08-26 DIAGNOSIS — Z794 Long term (current) use of insulin: Secondary | ICD-10-CM | POA: Insufficient documentation

## 2022-08-26 DIAGNOSIS — Z91040 Latex allergy status: Secondary | ICD-10-CM | POA: Insufficient documentation

## 2022-08-26 DIAGNOSIS — Z7984 Long term (current) use of oral hypoglycemic drugs: Secondary | ICD-10-CM | POA: Diagnosis not present

## 2022-08-26 DIAGNOSIS — M542 Cervicalgia: Secondary | ICD-10-CM | POA: Insufficient documentation

## 2022-08-26 LAB — CBC
HCT: 36 % (ref 36.0–46.0)
Hemoglobin: 11.7 g/dL — ABNORMAL LOW (ref 12.0–15.0)
MCH: 27.3 pg (ref 26.0–34.0)
MCHC: 32.5 g/dL (ref 30.0–36.0)
MCV: 83.9 fL (ref 80.0–100.0)
Platelets: 330 10*3/uL (ref 150–400)
RBC: 4.29 MIL/uL (ref 3.87–5.11)
RDW: 15 % (ref 11.5–15.5)
WBC: 7.7 10*3/uL (ref 4.0–10.5)
nRBC: 0 % (ref 0.0–0.2)

## 2022-08-26 LAB — COMPREHENSIVE METABOLIC PANEL
ALT: 11 U/L (ref 0–44)
AST: 26 U/L (ref 15–41)
Albumin: 3.8 g/dL (ref 3.5–5.0)
Alkaline Phosphatase: 71 U/L (ref 38–126)
Anion gap: 12 (ref 5–15)
BUN: 19 mg/dL (ref 6–20)
CO2: 23 mmol/L (ref 22–32)
Calcium: 8.8 mg/dL — ABNORMAL LOW (ref 8.9–10.3)
Chloride: 105 mmol/L (ref 98–111)
Creatinine, Ser: 1.13 mg/dL — ABNORMAL HIGH (ref 0.44–1.00)
GFR, Estimated: 57 mL/min — ABNORMAL LOW (ref >60)
Glucose, Bld: 92 mg/dL (ref 70–99)
Potassium: 4.6 mmol/L (ref 3.5–5.1)
Sodium: 140 mmol/L (ref 135–145)
Total Bilirubin: 0.4 mg/dL (ref 0.3–1.2)
Total Protein: 7.1 g/dL (ref 6.5–8.1)

## 2022-08-26 LAB — ETHANOL: Alcohol, Ethyl (B): <10 mg/dL (ref <10)

## 2022-08-26 LAB — PROTIME-INR
INR: 1 (ref 0.8–1.2)
Prothrombin Time: 13.2 seconds (ref 11.4–15.2)

## 2022-08-26 MED ORDER — HYDROCODONE-ACETAMINOPHEN 5-325 MG PO TABS
2.0000 | ORAL_TABLET | Freq: Once | ORAL | Status: AC
  Administered 2022-08-26: 2 via ORAL
  Filled 2022-08-26: qty 2
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
## 2022-08-27 ENCOUNTER — Telehealth: Payer: Self-pay

## 2022-08-27 DIAGNOSIS — M542 Cervicalgia: Secondary | ICD-10-CM | POA: Diagnosis not present

## 2022-08-27 MED ORDER — CYCLOBENZAPRINE HCL 10 MG PO TABS: 10.0000 mg | ORAL_TABLET | Freq: Two times a day (BID) | ORAL | 0 refills | Status: DC | PRN

## 2022-08-27 MED ORDER — HYDROMORPHONE HCL 1 MG/ML IJ SOLN
1.0000 mg | Freq: Once | INTRAMUSCULAR | Status: AC
  Administered 2022-08-27: 1 mg via INTRAVENOUS
  Filled 2022-08-27: qty 1

## 2022-08-27 MED ORDER — JANUMET 50-1000 MG PO TABS: 1.0000 | ORAL_TABLET | Freq: Every day | ORAL | 1 refills | Status: DC
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
## 2022-08-27 NOTE — Discharge Instructions (Signed)
Imaging exam was reassuring, likely you have muscular pain, have given you muscle relaxer please take as prescribed.  You may use over-the-counter pain medication as needed.  Symptoms improved after a week's time please follow-up with your PCP for reassessment.  Come back to the emergency department if you develop chest pain, shortness of breath, severe abdominal pain, uncontrolled nausea, vomiting, diarrhea.

## 2022-08-29 ENCOUNTER — Telehealth: Payer: Self-pay

## 2022-08-29 ENCOUNTER — Other Ambulatory Visit: Payer: Self-pay | Admitting: *Deleted

## 2022-08-29 DIAGNOSIS — R0989 Other specified symptoms and signs involving the circulatory and respiratory systems: Secondary | ICD-10-CM

## 2022-08-29 NOTE — Telephone Encounter (Signed)
Transition Care Management Unsuccessful Follow-up Telephone Call  Date of discharge and from where:  08/27/2022 [facility]  Attempts:  1st Attempt  Reason for unsuccessful TCM follow-up call:  Left voice message, also sent mychart message.

## 2022-08-30 ENCOUNTER — Ambulatory Visit: Payer: Self-pay

## 2022-08-30 NOTE — Patient Outreach (Signed)
  Care Coordination   08/30/2022 Name: Linda Mercado MRN: [id number] DOB: [birth year]   Care Coordination Outreach Attempts:  An unsuccessful telephone outreach was attempted for a scheduled appointment today.  Follow Up Plan:  Additional outreach attempts will be made to offer the patient care coordination information and services.   Encounter Outcome:  No Answer   Care Coordination Interventions:  No, not indicated    Barb Merino, RN, BSN, CCM Care Management Coordinator [facility] Care Management Direct Phone: [phone]

## 2022-09-13 ENCOUNTER — Other Ambulatory Visit: Payer: Self-pay

## 2022-09-13 DIAGNOSIS — S335XXA Sprain of ligaments of lumbar spine, initial encounter: Secondary | ICD-10-CM | POA: Insufficient documentation

## 2022-09-13 DIAGNOSIS — G4700 Insomnia, unspecified: Secondary | ICD-10-CM | POA: Insufficient documentation

## 2022-09-13 DIAGNOSIS — M542 Cervicalgia: Secondary | ICD-10-CM | POA: Insufficient documentation

## 2022-09-13 DIAGNOSIS — F4321 Adjustment disorder with depressed mood: Secondary | ICD-10-CM | POA: Insufficient documentation

## 2022-09-13 DIAGNOSIS — K219 Gastro-esophageal reflux disease without esophagitis: Secondary | ICD-10-CM | POA: Insufficient documentation

## 2022-09-13 DIAGNOSIS — R209 Unspecified disturbances of skin sensation: Secondary | ICD-10-CM | POA: Insufficient documentation

## 2022-09-13 DIAGNOSIS — R490 Dysphonia: Secondary | ICD-10-CM | POA: Insufficient documentation

## 2022-09-13 DIAGNOSIS — J189 Pneumonia, unspecified organism: Secondary | ICD-10-CM | POA: Insufficient documentation

## 2022-09-13 DIAGNOSIS — F418 Other specified anxiety disorders: Secondary | ICD-10-CM | POA: Insufficient documentation

## 2022-09-13 DIAGNOSIS — J45909 Unspecified asthma, uncomplicated: Secondary | ICD-10-CM | POA: Insufficient documentation

## 2022-09-13 DIAGNOSIS — Z302 Encounter for sterilization: Secondary | ICD-10-CM | POA: Insufficient documentation

## 2022-09-13 DIAGNOSIS — B359 Dermatophytosis, unspecified: Secondary | ICD-10-CM | POA: Insufficient documentation

## 2022-09-13 DIAGNOSIS — M5412 Radiculopathy, cervical region: Secondary | ICD-10-CM | POA: Insufficient documentation

## 2022-09-13 DIAGNOSIS — G5600 Carpal tunnel syndrome, unspecified upper limb: Secondary | ICD-10-CM | POA: Insufficient documentation

## 2022-09-13 DIAGNOSIS — M47816 Spondylosis without myelopathy or radiculopathy, lumbar region: Secondary | ICD-10-CM | POA: Insufficient documentation

## 2022-09-13 DIAGNOSIS — E041 Nontoxic single thyroid nodule: Secondary | ICD-10-CM | POA: Insufficient documentation

## 2022-09-13 DIAGNOSIS — R7301 Impaired fasting glucose: Secondary | ICD-10-CM | POA: Insufficient documentation

## 2022-09-16 ENCOUNTER — Ambulatory Visit (HOSPITAL_COMMUNITY): Payer: 59 | Attending: Surgery

## 2022-09-17 ENCOUNTER — Other Ambulatory Visit: Payer: Self-pay | Admitting: Nurse Practitioner

## 2022-09-17 DIAGNOSIS — I10 Essential (primary) hypertension: Secondary | ICD-10-CM

## 2022-09-23 ENCOUNTER — Telehealth: Payer: Self-pay | Admitting: *Deleted

## 2022-09-23 NOTE — Progress Notes (Signed)
  Care Coordination Note  09/23/2022 Name: PREETI HONIG MRN: [id number] DOB: [birth year]  Linda Mercado is a 57 y.o. year old female who is a primary care patient of [name], Stephen and is actively engaged with the care management team. I reached out to [name] by phone today to assist with re-scheduling a follow up visit with the RN Case Manager  Follow up plan: Unsuccessful telephone outreach attempt made.    [place]  Direct Dial: [phone]

## 2022-09-24 ENCOUNTER — Other Ambulatory Visit: Payer: Self-pay | Admitting: Nurse Practitioner

## 2022-09-27 NOTE — Progress Notes (Signed)
  Care Coordination Note  09/27/2022 Name: Linda Mercado MRN: [id number] DOB: 03-11-1966  Linda Mercado is a 57 y.o. year old female who is a primary care patient of [name], [place] and is actively engaged with the care management team. I reached out to [name] by phone today to assist with re-scheduling a follow up visit with the RN Case Manager  Follow up plan: Telephone appointment with care management team member scheduled for:10/10/22  [place]: [phone]

## 2022-10-03 ENCOUNTER — Ambulatory Visit: Payer: MEDICARE

## 2022-10-03 ENCOUNTER — Ambulatory Visit: Payer: MEDICARE | Admitting: Nurse Practitioner

## 2022-10-07 ENCOUNTER — Other Ambulatory Visit: Payer: Self-pay | Admitting: Nurse Practitioner

## 2022-10-09 ENCOUNTER — Ambulatory Visit (INDEPENDENT_AMBULATORY_CARE_PROVIDER_SITE_OTHER): Payer: MEDICARE

## 2022-10-09 VITALS — Ht 62.0 in | Wt 122.0 lb

## 2022-10-09 DIAGNOSIS — Z0000 Encounter for general adult medical examination without abnormal findings: Secondary | ICD-10-CM

## 2022-10-09 NOTE — Progress Notes (Signed)
I connected with  [name] on 10/09/22 by a audio enabled telemedicine application and verified that I am speaking with the correct person using two identifiers.  Patient Location: Home  Provider Location: Office/Clinic  I discussed the limitations of evaluation and management by telemedicine. The patient expressed understanding and agreed to proceed.  Subjective:   Linda Mercado is a 57 y.o. female who presents for Medicare Annual (Subsequent) preventive examination.  Review of Systems     Cardiac Risk Factors include: diabetes mellitus;hypertension     Objective:    Today's Vitals   10/09/22 1626  Weight: 122 lb (55.3 kg)  Height: 5\' 2"  (1.575 m)  PainSc: 10-Worst pain ever   Body mass index is 22.31 kg/m.     10/09/2022    4:39 PM 08/26/2022    8:12 PM 10/11/2021   12:30 AM 09/19/2021    2:50 PM 09/13/2020    9:28 AM 10/07/2019    7:19 AM 02/23/2019    2:16 PM  Advanced Directives  Does Patient Have a Medical Advance Directive? No No No No No No No  Would patient like information on creating a medical advance directive?  No - Patient declined  Yes (MAU/Ambulatory/Procedural Areas - Information given) Yes (MAU/Ambulatory/Procedural Areas - Information given) No - Patient declined Yes (MAU/Ambulatory/Procedural Areas - Information given)    Current Medications (verified) Outpatient Encounter Medications as of 10/09/2022  Medication Sig   albuterol (VENTOLIN HFA) 108 (90 Base) MCG/ACT inhaler INHALE TWO PUFFS BY MOUTH EVERY 6 HOURS AS NEEDED FOR WHEEZING OR FOR SHORTNESS OF BREATH   Alcohol Swabs PADS Use as directed to check blood sugars   atenolol (TENORMIN) 100 MG tablet TAKE ONE TABLET BY MOUTH DAILY AT 9AM   atorvastatin (LIPITOR) 10 MG tablet TAKE ONE TABLET BY MOUTH DAILY AT 9PM AT BEDTIME   Blood Glucose Monitoring Suppl (ONETOUCH VERIO) w/Device KIT Use as directed to check 1 time per day dx: e11.65   calcium gluconate 500 MG tablet Take 1 tablet (500 mg total)  by mouth 3 (three) times daily.   CAPLYTA 42 MG CAPS Take 42 mg by mouth at bedtime.    cyclobenzaprine (FLEXERIL) 10 MG tablet Take 1 tablet (10 mg total) by mouth 2 (two) times daily as needed for muscle spasms.   desvenlafaxine (PRISTIQ) 100 MG 24 hr tablet Take 100 mg by mouth every morning.   dexlansoprazole (DEXILANT) 60 MG capsule Take 1 capsule (60 mg total) by mouth daily. Patient will need appt for further refills   diclofenac Sodium (VOLTAREN) 1 % GEL APPLY 4 GRAMS TO THE AFFECTED AREA FOUR TIMES DAILY   docusate sodium (COLACE) 50 MG capsule Take 50 mg by mouth daily.    FLOVENT HFA 110 MCG/ACT inhaler INHALE TWO PUFFS BY MOUTH INTO LUNGS DAILY (BULK)   Glucagon (GVOKE HYPOPEN 2-PACK) 0.5 MG/0.1ML SOAJ Inject 1 each into the skin as needed.   hydrochlorothiazide (HYDRODIURIL) 12.5 MG tablet TAKE ONE TABLET BY MOUTH DAILY AT 9AM   HYDROmorphone (DILAUDID) 4 MG tablet Take 4 mg by mouth 4 (four) times daily as needed for severe pain.   hydrOXYzine (ATARAX/VISTARIL) 25 MG tablet Take 50 mg by mouth in the morning, at noon, in the evening, and at bedtime.   lidocaine (XYLOCAINE) 2 % solution Use as directed 15 mLs in the mouth or throat 3 (three) times a week.    Lidocaine 4 % PTCH Place 1 patch onto the skin in the morning and at bedtime.  LORazepam (ATIVAN) 2 MG tablet Take 2 mg by mouth 4 (four) times daily as needed for anxiety.    losartan (COZAAR) 25 MG tablet TAKE ONE TABLET BY MOUTH DAILY AT 9AM   lubiprostone (AMITIZA) 24 MCG capsule Take by mouth.   meclizine (ANTIVERT) 12.5 MG tablet TAKE ONE TABLET BY MOUTH THREE TIMES DAILY AS NEEDED FOR DIZZINESS (VIAL)   meloxicam (MOBIC) 15 MG tablet Take 15 mg by mouth daily.   Multiple Vitamin (MULTIVITAMIN WITH MINERALS) TABS tablet Take 1 tablet by mouth daily.    NARCAN 4 MG/0.1ML LIQD nasal spray kit Place 1 spray into the nose as directed.   Norgestimate-Ethinyl Estradiol Triphasic (TRI-ESTARYLLA) 0.18/0.215/0.25 MG-35 MCG tablet  Take 1 tablet by mouth daily.   ondansetron (ZOFRAN-ODT) 8 MG disintegrating tablet Take 1 tablet (8 mg total) by mouth every 8 (eight) hours as needed for nausea or vomiting. 8mg  ODT q4 hours prn nausea   OneTouch Delica Lancets 99991111 MISC Use as directed to check 1 time per day dx: e11.65   ONETOUCH VERIO test strip Use as directed to check 1 time per day dx: e11.65   PAIN MANAGEMENT INTRATHECAL, IT, PUMP 1 each by Intrathecal route continuous. Intrathecal (IT) medication: fentanyl, clonidine, bupivicaine  Fentanyl 250mg /bupivicaine150mg /clonidine2mg   51.97mg  per day, 2mg  per day, 1.16mcg 15.21ml/qmin   polyethylene glycol powder (GLYCOLAX/MIRALAX) 17 GM/SCOOP powder MIX 17 GRAMS IN 8 OUNCES OF LIQUID AND DRINK DAILY FOR BOWELS   QUEtiapine (SEROQUEL XR) 400 MG 24 hr tablet Take 400 mg by mouth 2 (two) times daily.   QUEtiapine (SEROQUEL) 300 MG tablet Take 300 mg by mouth at bedtime.   sitaGLIPtin-metformin (JANUMET) 50-1000 MG tablet Take 1 tablet by mouth daily with breakfast.   tiZANidine (ZANAFLEX) 4 MG tablet Take 4 mg by mouth every 4 (four) hours.   traZODone (DESYREL) 100 MG tablet 5 qhs prescribed by psychiatry (Patient taking differently: Take 100-200 mg by mouth at bedtime. 5 qhs prescribed by psychiatry)   triamcinolone cream (KENALOG) 0.1 % Apply 1 application topically daily as needed (for itching).   Carbamazepine (EQUETRO) 300 MG CP12 Take by mouth. (Patient not taking: Reported on 10/09/2022)   clonazePAM (KLONOPIN) 1 MG tablet Take 1 mg by mouth 3 (three) times daily. (Patient not taking: Reported on 08/20/2022)   fluconazole (DIFLUCAN) 100 MG tablet Take 1 tablet (100 mg total) by mouth daily. Take 1 tablet by mouth now repeat in 5 days (Patient not taking: Reported on 10/09/2022)   GNP ULTICARE PEN NEEDLES 32G X 4 MM MISC USE AS DIRECTED WITH TRESIBA DAILY (BULK)   sucralfate (CARAFATE) 1 GM/10ML suspension TAKE 10 ML BY MOUTH TWICE DAILY (BULK) (Patient not taking: Reported on  10/09/2022)   TRINTELLIX 20 MG TABS tablet Take 20 mg by mouth daily. (Patient not taking: Reported on 08/20/2022)   No facility-administered encounter medications on file as of 10/09/2022.    Allergies (verified) Lisinopril, Gabapentin, Latex, Morphine and related, and Adhesive [tape]   History: Past Medical History:  Diagnosis Date   Acid reflux    Bradycardia    Chronic back pain    Diabetes mellitus without complication ([place])    "prediabetes", A1C down to 5 - reported on 10/11/21   Hypertension    Hypokalemia    Past Surgical History:  Procedure Laterality Date   APPENDECTOMY     BACK SURGERY     x6   CESAREAN SECTION     x3   ESOPHAGEAL MANOMETRY N/A 08/25/2019   Procedure:  ESOPHAGEAL MANOMETRY (EM);  Surgeon: Carol Ada, MD;  Location: [facility];  Service: Endoscopy;  Laterality: N/A;   HERNIA REPAIR     Family History  Problem Relation Age of Onset   Kidney disease Mother    Hypertension Mother    Diabetes Father    Hypertension Father    Bipolar disorder Sister    Schizophrenia Sister    Neuropathy Sister    Hypertension Sister    Bipolar disorder Daughter    Post-traumatic stress disorder Daughter    Asthma Daughter    Depression Daughter    Bipolar disorder Daughter    Post-traumatic stress disorder Daughter    Asthma Daughter    Depression Daughter    Asthma Daughter    Depression Daughter    Asthma Daughter    Breast cancer Paternal Grandmother    Bipolar disorder Brother    Bipolar disorder Brother    Schizophrenia Brother    Stroke Neg Hx    Cancer Neg Hx    CAD Neg Hx    Social History   Socioeconomic History   Marital status: Divorced    Spouse name: Not on file   Number of children: Not on file   Years of education: Not on file   Highest education level: Not on file  Occupational History   Occupation: disability  Tobacco Use   Smoking status: Never   Smokeless tobacco: Never  Vaping Use   Vaping Use: Never used  Substance and  Sexual Activity   Alcohol use: Not Currently   Drug use: Never   Sexual activity: Yes  Other Topics Concern   Not on file  Social History Narrative   Caffeine- none   Social Determinants of Health   Financial Resource Strain: Low Risk  (10/09/2022)   Overall Financial Resource Strain (CARDIA)    Difficulty of Paying Living Expenses: Not hard at all  Food Insecurity: Food Insecurity Present (10/09/2022)   Hunger Vital Sign    Worried About Running Out of Food in the Last Year: Sometimes true    Ran Out of Food in the Last Year: Sometimes true  Transportation Needs: Unmet Transportation Needs (10/09/2022)   PRAPARE - Transportation    Lack of Transportation (Medical): Yes    Lack of Transportation (Non-Medical): Yes  Physical Activity: Inactive (10/09/2022)   Exercise Vital Sign    Days of Exercise per Week: 0 days    Minutes of Exercise per Session: 0 min  Stress: Stress Concern Present (10/09/2022)   [place] of Stress : To some extent  Social Connections: Not on file    Tobacco Counseling Counseling given: Not Answered   Clinical Intake:  Pre-visit preparation completed: Yes  Pain : 0-10 Pain Score: 10-Worst pain ever Pain Type: Chronic pain Pain Location: Generalized Pain Descriptors / Indicators: Aching Pain Onset: More than a month ago Pain Frequency: Constant     Nutritional Status: BMI of 19-24  Normal Nutritional Risks: Nausea/ vomitting/ diarrhea (vomits every other day, been going on for years) Diabetes: Yes  How often do you need to have someone help you when you read instructions, pamphlets, or other written materials from your doctor or pharmacy?: 1 - Never  Diabetic?yes Nutrition Risk Assessment:  Has the patient had any N/V/D within the last 2 months?  Yes  Does the patient have any non-healing wounds?  No  Has the patient had any unintentional weight loss or  weight  gain?  Yes   Diabetes:  Is the patient diabetic?  Yes  If diabetic, was a CBG obtained today?  No  Did the patient bring in their glucometer from home?  No  How often do you monitor your CBG's? Once wweekly.   Financial Strains and Diabetes Management:  Are you having any financial strains with the device, your supplies or your medication? No .  Does the patient want to be seen by Chronic Care Management for management of their diabetes?  No  Would the patient like to be referred to a Nutritionist or for Diabetic Management?  No   Diabetic Exams:  Diabetic Eye Exam: Overdue for diabetic eye exam. Pt has been advised about the importance in completing this exam. Patient advised to call and schedule an eye exam. Diabetic Foot Exam: Overdue, Pt has been advised about the importance in completing this exam. Pt is scheduled for diabetic foot exam on next appointment.   Interpreter Needed?: No  Information entered by :: NAllen LPN   Activities of Daily Living    10/09/2022    4:44 PM  In your present state of health, do you have any difficulty performing the following activities:  Hearing? 0  Vision? 0  Difficulty concentrating or making decisions? 0  Walking or climbing stairs? 1  Dressing or bathing? 1  Doing errands, shopping? 1  Preparing Food and eating ? N  Using the Toilet? N  In the past six months, have you accidently leaked urine? N  Do you have problems with loss of bowel control? N  Managing your Medications? N  Managing your Finances? N  Housekeeping or managing your Housekeeping? Y    Patient Care Team: [name], FNP as PCP - General ([place]) Rex Kras, Claudette Stapler, RN as Case Manager Mayford Knife, [facility] (Pharmacist)  Indicate any recent Medical Services you may have received from other than [facility] providers in the past year (date may be approximate).     Assessment:   This is a routine wellness examination for Linda Mercado.  Hearing/Vision  screen Vision Screening - Comments:: Regular eye exams, [facility]  Dietary issues and exercise activities discussed: Current Exercise Habits: The patient does not participate in regular exercise at present   Goals Addressed             This Visit's Progress    Patient Stated       10/09/2022, no goals       Depression Screen    10/09/2022    4:42 PM 08/20/2022    2:53 PM 02/04/2022    4:37 PM 09/19/2021    2:53 PM 09/13/2020    9:30 AM 03/25/2019   11:26 AM 02/18/2019   10:46 AM  PHQ 2/9 Scores  PHQ - 2 Score 6 0 6 6 4 4 6   PHQ- 9 Score 12  24 24 12 18 19     Fall Risk    10/09/2022    4:42 PM 08/20/2022    2:53 PM 02/04/2022    4:37 PM 09/19/2021    2:52 PM 09/13/2020    9:29 AM  Fall Risk   Falls in the past year? 1 1 1 1 1   Comment legs give out   legs give out lost balance  Number falls in past yr: 1 0 1 1 1   Injury with Fall? 1 0 0 1 0  Risk for fall due to : Impaired balance/gait;Impaired mobility;History of fall(s);Medication side effect History of  fall(s)  Impaired mobility;Impaired balance/gait;Medication side effect History of fall(s);Impaired balance/gait;Impaired mobility;Medication side effect  Follow up Falls prevention discussed;Education provided;Falls evaluation completed Falls evaluation completed Falls evaluation completed Falls evaluation completed;Education provided;Falls prevention discussed Falls evaluation completed;Education provided;Falls prevention discussed    FALL RISK PREVENTION PERTAINING TO THE HOME:  Any stairs in or around the home? Yes  If so, are there any without handrails? No  Home free of loose throw rugs in walkways, pet beds, electrical cords, etc? Yes  Adequate lighting in your home to reduce risk of falls? Yes   ASSISTIVE DEVICES UTILIZED TO PREVENT FALLS:  Life alert? No  Use of a cane, walker or w/c? Yes  Grab bars in the bathroom? No  Shower chair or bench in shower? No  Elevated toilet seat or a handicapped toilet? Yes    TIMED UP AND GO:  Was the test performed? No .      Cognitive Function:        10/09/2022    4:46 PM 09/13/2020    9:34 AM 09/17/2018   11:28 AM 09/17/2018   10:47 AM  6CIT Screen  What Year? 0 points 0 points 0 points 0 points  What month? 0 points 0 points 0 points 0 points  What time? 3 points 0 points 0 points 0 points  Count back from 20 0 points 0 points 0 points 0 points  Months in reverse 0 points 0 points 0 points 2 points  Repeat phrase 2 points 4 points 10 points 2 points  Total Score 5 points 4 points 10 points 4 points    Immunizations Immunization History  Administered Date(s) Administered   Influenza Inj Mdck Quad With Preservative 05/11/2018   Influenza Nasal 04/10/2011   Influenza Split 05/18/2012   Influenza, Seasonal, Injecte, Preservative Fre 05/18/2014   Influenza,Quad,Nasal, Live 06/07/2013   Influenza,inj,Quad PF,6+ Mos 09/19/2016, 04/24/2017   PFIZER Comirnaty(Gray Top)Covid-19 Tri-Sucrose Vaccine 12/31/2020   PFIZER(Purple Top)SARS-COV-2 Vaccination 06/05/2020, 06/26/2020, 12/31/2020   Tdap 05/18/2014    TDAP status: Up to date  Flu Vaccine status: Declined, Education has been provided regarding the importance of this vaccine but patient still declined. Advised may receive this vaccine at local pharmacy or Health Dept. Aware to provide a copy of the vaccination record if obtained from local pharmacy or Health Dept. Verbalized acceptance and understanding.  Pneumococcal vaccine status: Up to date  Covid-19 vaccine status: Completed vaccines  Qualifies for Shingles Vaccine? Yes   Zostavax completed No   Shingrix Completed?: No.    Education has been provided regarding the importance of this vaccine. Patient has been advised to call insurance company to determine out of pocket expense if they have not yet received this vaccine. Advised may also receive vaccine at local pharmacy or Health Dept. Verbalized acceptance and  understanding.  Screening Tests Health Maintenance  Topic Date Due   OPHTHALMOLOGY EXAM  09/08/2021   FOOT EXAM  02/07/2022   COVID-19 Vaccine (5 - 2023-24 season) 03/22/2022   PAP SMEAR-Modifier  04/28/2022   INFLUENZA VACCINE  10/20/2022 (Originally 02/19/2022)   Zoster Vaccines- Shingrix (1 of 2) 11/19/2022 (Originally [birth year]1985)   Diabetic kidney evaluation - Urine ACR  02/05/2023   MAMMOGRAM  02/10/2023   HEMOGLOBIN A1C  02/18/2023   Diabetic kidney evaluation - eGFR measurement  08/27/2023   DTaP/Tdap/Td (2 - Td or Tdap) 05/18/2024   COLONOSCOPY (Pts 45-12yrs Insurance coverage will need to be confirmed)  01/10/2031   Hepatitis C Screening  Completed  HIV Screening  Completed   HPV VACCINES  Aged Out    Health Maintenance  Health Maintenance Due  Topic Date Due   OPHTHALMOLOGY EXAM  09/08/2021   FOOT EXAM  02/07/2022   COVID-19 Vaccine (5 - 2023-24 season) 03/22/2022   PAP SMEAR-Modifier  04/28/2022    Colorectal cancer screening: Type of screening: Colonoscopy. Completed 01/09/2021. Repeat every 10 years  Mammogram status: Completed 02/09/2021. Repeat every 2 years  Bone Density status: n/a  Lung Cancer Screening: (Low Dose CT Chest recommended if Age 66-80 years, 30 pack-year currently smoking OR have quit w/in 15years.) does not qualify.   Lung Cancer Screening Referral: no  Additional Screening:  Hepatitis C Screening: does qualify; Completed 08/24/2020  Vision Screening: Recommended annual ophthalmology exams for early detection of glaucoma and other disorders of the eye. Is the patient up to date with their annual eye exam?  No  Who is the provider or what is the name of the office in which the patient attends annual eye exams? [facility] If pt is not established with a provider, would they like to be referred to a provider to establish care? No .   Dental Screening: Recommended annual dental exams for proper oral hygiene  Community Resource Referral /  Chronic Care Management: CRR required this visit?  No   CCM required this visit?  No      Plan:     I have personally reviewed and noted the following in the patient's chart:   Medical and social history Use of alcohol, tobacco or illicit drugs  Current medications and supplements including opioid prescriptions. Patient is currently taking opioid prescriptions. Information provided to patient regarding non-opioid alternatives. Patient advised to discuss non-opioid treatment plan with their provider. Functional ability and status Nutritional status Physical activity Advanced directives List of other physicians Hospitalizations, surgeries, and ER visits in previous 12 months Vitals Screenings to include cognitive, depression, and falls Referrals and appointments  In addition, I have reviewed and discussed with patient certain preventive protocols, quality metrics, and best practice recommendations. A written personalized care plan for preventive services as well as general preventive health recommendations were provided to patient.     Kellie Simmering, LPN   [id number]   Nurse Notes: none  Due to this being a virtual visit, the after visit summary with patients personalized plan was offered to patient via mail or my-chart.  Patient would like to access on my-chart

## 2022-10-09 NOTE — Patient Instructions (Signed)
Linda Mercado , Thank you for taking time to come for your Medicare Wellness Visit. I appreciate your ongoing commitment to your health goals. Please review the following plan we discussed and let me know if I can assist you in the future.   These are the goals we discussed:  Goals       "I need medication for nausea and vomiting" (pt-stated)      Care Coordination Interventions: Evaluation of current treatment plan related to H. Pylori  and patient's adherence to plan as established by provider Determined patient completed a follow up with Dr. Benson Norway for evaluation of nausea/vomiting Discussed patient was referred to a new specialist by Dr. Benson Norway for further evaluation of these symptom, H. Pylori was negative at last follow up Discussed patient is scheduled to f/u with the new Specialist in March 2024 Reinforced to patient the importance of keeping all scheduled medical appointments and to inform her doctor of any new or worsening symptoms          "I would like for my neck to feel better"      Care Coordination Interventions: Reviewed provider established plan for pain management Discussed patient underwent a LEFT C3-5 Medial Branch Nerve Blocks (#1/2)- Diagnostic Block  Determined patient will undergo a RIGHT C3-5 Medial Branch Nerve Block in 2 weeks Discussed patient will undergo a new pain pump replacement in the early part of next year around April 2024 Discussed patient verbalizes understanding of her prescribed treatment plan       I had another fall (pt-stated)      Care Coordination Interventions: Provided written and verbal education re: potential causes of falls and Fall prevention strategies Assessed for falls since last encounter Assessed patients knowledge of fall risk prevention secondary to previously provided education Screening for signs and symptoms of 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  Referral sent to Christa See LCSW for assistance with counseling for  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         Identify Transportation Resources      Timeframe:  Short-Term Goal Priority:  High Start Date:  12.8.22                              Patient Goals/Self-Care Activities patient will:   - Review mailed resource information -Engage with SW to address transportation resource needs                         Manage My Medicine      Timeframe:  Long-Range Goal Priority:  High Start Date:                             Expected End Date:                       Follow Up Date 12/26/2020   - call for medicine refill 2 or 3 days before it runs out - call if I am sick and can't take my medicine - use a pillbox to sort medicine - use an alarm clock or phone to remind me to take my medicine    Why is this important?   These steps will help you keep on track with your medicines.         Obtain Supportive Resources/Management of [facility] Conditions      Care Coordination  Interventions: Solution-Focused Strategies employed:  Active listening / Reflection utilized  Emotional Support Provided Verbalization of feelings encouraged  LCSW informed patient of care coordination services. Pt is interested at this time and agreed to schedule initial appt with LCSW       Patient Stated      09/13/2020, no goals      Patient Stated      09/19/2021, everything clear up      Patient Stated      10/09/2022, no goals      Pharmacy Care Plan (pt-stated)      CARE PLAN ENTRY (see longitudinal plan of care for additional care plan information)  Current Barriers:  Chronic Disease Management support, education, and care coordination needs related to Hypertension, Hyperlipidemia, and Diabetes   Hypertension BP Readings from Last 3 Encounters:  05/22/20 120/76  10/28/19 118/80  10/12/19 118/80  Pharmacist Clinical Goal(s): Over the next 90 days, patient will work with PharmD and providers to maintain BP goal <130/80 Current regimen:  Losartan 25 MG tablet  take daily  Hydrochlorothiazide 12.5 MG take daily Atenolol 100 MG tablet take daily Clonidine 0.1 MG  take 1 tablet three times a day as needed  Interventions: Maintain heart healthy, balanced diet Patient receiving a BP cuff through her insurance program Patient self care activities - Over the next 90 days, patient will: Check BP 2-3 times per week, document, and provide at future appointments Ensure daily salt intake < 2300 mg/day  Hyperlipidemia Lab Results  Component Value Date/Time   LDLCALC 48 04/29/2019 03:53 PM  Pharmacist Clinical Goal(s): Over the next 90 days, patient will work with PharmD and providers to maintain LDL goal < 70 Current regimen:  Atorvastatin 10 MG -Take daily at bedtime Interventions: Using her BP cuff at least 2-3 times per week  Patient self care activities - Over the next 90 days, patient will: Continue to limit fried foods Take medications daily as directed Try to exercise for 30 minutes daily 5 times per week (150 minutes per week total)  Diabetes Lab Results  Component Value Date/Time   HGBA1C 6.7 (H) 10/12/2019 05:03 PM   HGBA1C 6.5 (H) 04/29/2019 03:53 PM  Pharmacist Clinical Goal(s): Over the next 90 days, patient will work with PharmD and providers to maintain A1c goal <7% Current regimen:  Metformin 500 MG take twice daily with a meal Interventions: Encouraged continuation of diabetic-friendly, balanced diet Patient self care activities - Over the next 90 days, patient will: Check blood sugar twice daily, document, and provide at future appointments Contact provider with any episodes of hypoglycemia  Vitamin D Deficiency Pharmacist Clinical Goal(s) Over the next 90 days, patient will work with PharmD to increase Vitamin D levels in patient to greater than  Current regimen:  Vitamin D Ergocalciferol 1.25 MG -Take one capsule by mouth two times a week on Sunday and Thursday  Interventions: Collaborate with provider team to  determine if patient should still be taking Vitamin D.  Patient self care activities - Over the next 90 days, patient will: Continue to go outside on sunny days   Medication management Pharmacist Clinical Goal(s): Over the next 90 days, patient will work with PharmD and providers to achieve optimal medication adherence Current pharmacy: [place]  Interventions Comprehensive medication review performed. Continue current medication management strategy Patient self care activities - Over the next 90 days, patient will: Focus on medication adherence by using a medication reminder system Take medications as prescribed Report any questions or  concerns to PharmD and/or provider(s)  Initial goal documentation   Orlando Penner, PharmD Clinical Pharmacist [facility] [phone]          This is a list of the screening recommended for you and due dates:  Health Maintenance  Topic Date Due   Eye exam for diabetics  09/08/2021   Complete foot exam   02/07/2022   COVID-19 Vaccine (5 - 2023-24 season) 03/22/2022   Pap Smear  04/28/2022   Flu Shot  10/20/2022*   Zoster (Shingles) Vaccine (1 of 2) 11/19/2022*   Yearly kidney health urinalysis for diabetes  02/05/2023   Mammogram  02/10/2023   Hemoglobin A1C  02/18/2023   Yearly kidney function blood test for diabetes  08/27/2023   DTaP/Tdap/Td vaccine (2 - Td or Tdap) 05/18/2024   Colon Cancer Screening  01/10/2031   Hepatitis C Screening: USPSTF Recommendation to screen - Ages 18-79 yo.  Completed   HIV Screening  Completed   HPV Vaccine  Aged Out  *Topic was postponed. The date shown is not the original due date.    Advanced directives: Advance directive discussed with you today. .  Conditions/risks identified: none  Next appointment: Follow up in one year for your annual wellness visit.   Preventive Care 40-64 Years, Female Preventive care refers to lifestyle choices and visits with your  health care provider that can promote health and wellness. What does preventive care include? A yearly physical exam. This is also called an annual well check. Dental exams once or twice a year. Routine eye exams. Ask your health care provider how often you should have your eyes checked. Personal lifestyle choices, including: Daily care of your teeth and gums. Regular physical activity. Eating a healthy diet. Avoiding tobacco and drug use. Limiting alcohol use. Practicing safe sex. Taking low-dose aspirin daily starting at age 61. Taking vitamin and mineral supplements as recommended by your health care provider. What happens during an annual well check? The services and screenings done by your health care provider during your annual well check will depend on your age, overall health, lifestyle risk factors, and family history of disease. Counseling  Your health care provider may ask you questions about your: Alcohol use. Tobacco use. Drug use. Emotional well-being. Home and relationship well-being. Sexual activity. Eating habits. Work and work Statistician. Method of birth control. Menstrual cycle. Pregnancy history. Screening  You may have the following tests or measurements: Height, weight, and BMI. Blood pressure. Lipid and cholesterol levels. These may be checked every 5 years, or more frequently if you are over 82 years old. Skin check. Lung cancer screening. You may have this screening every year starting at age 74 if you have a 30-pack-year history of smoking and currently smoke or have quit within the past 15 years. Fecal occult blood test (FOBT) of the stool. You may have this test every year starting at age 39. Flexible sigmoidoscopy or colonoscopy. You may have a sigmoidoscopy every 5 years or a colonoscopy every 10 years starting at age 65. Hepatitis C blood test. Hepatitis B blood test. Sexually transmitted disease (STD) testing. Diabetes screening. This is done  by checking your blood sugar (glucose) after you have not eaten for a while (fasting). You may have this done every 1-3 years. Mammogram. This may be done every 1-2 years. Talk to your health care provider about when you should start having regular mammograms. This may depend on whether you have a family history of breast cancer. BRCA-related cancer  screening. This may be done if you have a family history of breast, ovarian, tubal, or peritoneal cancers. Pelvic exam and Pap test. This may be done every 3 years starting at age 26. Starting at age 73, this may be done every 5 years if you have a Pap test in combination with an HPV test. Bone density scan. This is done to screen for osteoporosis. You may have this scan if you are at high risk for osteoporosis. Discuss your test results, treatment options, and if necessary, the need for more tests with your health care provider. Vaccines  Your health care provider may recommend certain vaccines, such as: Influenza vaccine. This is recommended every year. Tetanus, diphtheria, and acellular pertussis (Tdap, Td) vaccine. You may need a Td booster every 10 years. Zoster vaccine. You may need this after age 39. Pneumococcal 13-valent conjugate (PCV13) vaccine. You may need this if you have certain conditions and were not previously vaccinated. Pneumococcal polysaccharide (PPSV23) vaccine. You may need one or two doses if you smoke cigarettes or if you have certain conditions. Talk to your health care provider about which screenings and vaccines you need and how often you need them. This information is not intended to replace advice given to you by your health care provider. Make sure you discuss any questions you have with your health care provider. Document Released: 08/04/2015 Document Revised: 03/27/2016 Document Reviewed: 05/09/2015 Elsevier Interactive Patient Education  2017 [employer] Prevention in the Home Falls can cause injuries.  They can happen to people of all ages. There are many things you can do to make your home safe and to help prevent falls. What can I do on the outside of my home? Regularly fix the edges of walkways and driveways and fix any cracks. Remove anything that might make you trip as you walk through a door, such as a raised step or threshold. Trim any bushes or trees on the path to your home. Use bright outdoor lighting. Clear any walking paths of anything that might make someone trip, such as rocks or tools. Regularly check to see if handrails are loose or broken. Make sure that both sides of any steps have handrails. Any raised decks and porches should have guardrails on the edges. Have any leaves, snow, or ice cleared regularly. Use sand or salt on walking paths during winter. Clean up any spills in your garage right away. This includes oil or grease spills. What can I do in the bathroom? Use night lights. Install grab bars by the toilet and in the tub and shower. Do not use towel bars as grab bars. Use non-skid mats or decals in the tub or shower. If you need to sit down in the shower, use a plastic, non-slip stool. Keep the floor dry. Clean up any water that spills on the floor as soon as it happens. Remove soap buildup in the tub or shower regularly. Attach bath mats securely with double-sided non-slip rug tape. Do not have throw rugs and other things on the floor that can make you trip. What can I do in the bedroom? Use night lights. Make sure that you have a light by your bed that is easy to reach. Do not use any sheets or blankets that are too big for your bed. They should not hang down onto the floor. Have a firm chair that has side arms. You can use this for support while you get dressed. Do not have throw rugs and  other things on the floor that can make you trip. What can I do in the kitchen? Clean up any spills right away. Avoid walking on wet floors. Keep items that you use a lot  in easy-to-reach places. If you need to reach something above you, use a strong step stool that has a grab bar. Keep electrical cords out of the way. Do not use floor polish or wax that makes floors slippery. If you must use wax, use non-skid floor wax. Do not have throw rugs and other things on the floor that can make you trip. What can I do with my stairs? Do not leave any items on the stairs. Make sure that there are handrails on both sides of the stairs and use them. Fix handrails that are broken or loose. Make sure that handrails are as long as the stairways. Check any carpeting to make sure that it is firmly attached to the stairs. Fix any carpet that is loose or worn. Avoid having throw rugs at the top or bottom of the stairs. If you do have throw rugs, attach them to the floor with carpet tape. Make sure that you have a light switch at the top of the stairs and the bottom of the stairs. If you do not have them, ask someone to add them for you. What else can I do to help prevent falls? Wear shoes that: Do not have high heels. Have rubber bottoms. Are comfortable and fit you well. Are closed at the toe. Do not wear sandals. If you use a stepladder: Make sure that it is fully opened. Do not climb a closed stepladder. Make sure that both sides of the stepladder are locked into place. Ask someone to hold it for you, if possible. Clearly mark and make sure that you can see: Any grab bars or handrails. First and last steps. Where the edge of each step is. Use tools that help you move around (mobility aids) if they are needed. These include: Canes. Walkers. Scooters. Crutches. Turn on the lights when you go into a dark area. Replace any light bulbs as soon as they burn out. Set up your furniture so you have a clear path. Avoid moving your furniture around. If any of your floors are uneven, fix them. If there are any pets around you, be aware of where they are. Review your medicines  with your doctor. Some medicines can make you feel dizzy. This can increase your chance of falling. Ask your doctor what other things that you can do to help prevent falls. This information is not intended to replace advice given to you by your health care provider. Make sure you discuss any questions you have with your health care provider. Document Released: 05/04/2009 Document Revised: 12/14/2015 Document Reviewed: 08/12/2014 Elsevier Interactive Patient Education  2017 [employer].

## 2022-10-10 ENCOUNTER — Ambulatory Visit: Payer: Self-pay

## 2022-10-10 NOTE — Patient Outreach (Signed)
  Care Coordination   10/10/2022 Name: Linda Mercado MRN: [id number] DOB: 18-Jun-1966   Care Coordination Outreach Attempts:  An unsuccessful telephone outreach was attempted for a scheduled appointment today.  Follow Up Plan:  Additional outreach attempts will be made to offer the patient care coordination information and services.   Encounter Outcome:  No Answer   Care Coordination Interventions:  No, not indicated    Barb Merino, RN, BSN, CCM Care Management Coordinator [facility] Care Management Direct Phone: [phone]

## 2022-10-13 ENCOUNTER — Emergency Department (HOSPITAL_COMMUNITY): Payer: Medicare HMO

## 2022-10-13 ENCOUNTER — Encounter (HOSPITAL_COMMUNITY): Payer: Self-pay

## 2022-10-13 ENCOUNTER — Other Ambulatory Visit: Payer: Self-pay

## 2022-10-13 ENCOUNTER — Emergency Department (HOSPITAL_COMMUNITY)
Admission: EM | Admit: 2022-10-13 | Discharge: 2022-10-13 | Disposition: A | Discharge status: Home / Self Care | Payer: Medicare HMO | Attending: Emergency Medicine | Admitting: Emergency Medicine

## 2022-10-13 DIAGNOSIS — Z91040 Latex allergy status: Secondary | ICD-10-CM | POA: Insufficient documentation

## 2022-10-13 DIAGNOSIS — K5900 Constipation, unspecified: Secondary | ICD-10-CM | POA: Insufficient documentation

## 2022-10-13 DIAGNOSIS — R1013 Epigastric pain: Secondary | ICD-10-CM | POA: Diagnosis present

## 2022-10-13 LAB — URINALYSIS, ROUTINE W REFLEX MICROSCOPIC
Bacteria, UA: NONE SEEN
Bilirubin Urine: NEGATIVE
Glucose, UA: NEGATIVE mg/dL
Hgb urine dipstick: NEGATIVE
Ketones, ur: 5 mg/dL — AB
Nitrite: NEGATIVE
Protein, ur: NEGATIVE mg/dL
Specific Gravity, Urine: 1.035 — ABNORMAL HIGH (ref 1.005–1.030)
pH: 6 (ref 5.0–8.0)

## 2022-10-13 LAB — CBC WITH DIFFERENTIAL/PLATELET
Abs Immature Granulocytes: 0.03 10*3/uL (ref 0.00–0.07)
Basophils Absolute: 0 10*3/uL (ref 0.0–0.1)
Basophils Relative: 0 %
Eosinophils Absolute: 0.1 10*3/uL (ref 0.0–0.5)
Eosinophils Relative: 1 %
HCT: 43.9 % (ref 36.0–46.0)
Hemoglobin: 14.4 g/dL (ref 12.0–15.0)
Immature Granulocytes: 0 %
Lymphocytes Relative: 25 %
Lymphs Abs: 1.8 10*3/uL (ref 0.7–4.0)
MCH: 27.1 pg (ref 26.0–34.0)
MCHC: 32.8 g/dL (ref 30.0–36.0)
MCV: 82.7 fL (ref 80.0–100.0)
Monocytes Absolute: 0.3 10*3/uL (ref 0.1–1.0)
Monocytes Relative: 5 %
Neutro Abs: 4.9 10*3/uL (ref 1.7–7.7)
Neutrophils Relative %: 69 %
Platelets: 270 10*3/uL (ref 150–400)
RBC: 5.31 MIL/uL — ABNORMAL HIGH (ref 3.87–5.11)
RDW: 14 % (ref 11.5–15.5)
WBC: 7.2 10*3/uL (ref 4.0–10.5)
nRBC: 0 % (ref 0.0–0.2)

## 2022-10-13 LAB — COMPREHENSIVE METABOLIC PANEL
ALT: 12 U/L (ref 0–44)
AST: 18 U/L (ref 15–41)
Albumin: 4.5 g/dL (ref 3.5–5.0)
Alkaline Phosphatase: 71 U/L (ref 38–126)
Anion gap: 14 (ref 5–15)
BUN: 16 mg/dL (ref 6–20)
CO2: 23 mmol/L (ref 22–32)
Calcium: 8.7 mg/dL — ABNORMAL LOW (ref 8.9–10.3)
Chloride: 102 mmol/L (ref 98–111)
Creatinine, Ser: 0.98 mg/dL (ref 0.44–1.00)
GFR, Estimated: >60 mL/min (ref >60)
Glucose, Bld: 83 mg/dL (ref 70–99)
Potassium: 3.7 mmol/L (ref 3.5–5.1)
Sodium: 139 mmol/L (ref 135–145)
Total Bilirubin: 0.6 mg/dL (ref 0.3–1.2)
Total Protein: 7.9 g/dL (ref 6.5–8.1)

## 2022-10-13 LAB — LIPASE, BLOOD: Lipase: 43 U/L (ref 11–51)

## 2022-10-13 MED ORDER — FENTANYL CITRATE PF 50 MCG/ML IJ SOSY
100.0000 ug | PREFILLED_SYRINGE | Freq: Once | INTRAMUSCULAR | Status: AC
  Administered 2022-10-13: 100 ug via INTRAVENOUS
  Filled 2022-10-13: qty 2

## 2022-10-13 MED ORDER — METOCLOPRAMIDE HCL 5 MG/ML IJ SOLN
10.0000 mg | Freq: Once | INTRAMUSCULAR | Status: AC
  Administered 2022-10-13: 10 mg via INTRAVENOUS
  Filled 2022-10-13: qty 2

## 2022-10-13 MED ORDER — LACTATED RINGERS IV BOLUS
1000.0000 mL | Freq: Once | INTRAVENOUS | Status: AC
  Administered 2022-10-13: 1000 mL via INTRAVENOUS

## 2022-10-13 MED ORDER — IOHEXOL 300 MG/ML  SOLN
100.0000 mL | Freq: Once | INTRAMUSCULAR | Status: AC | PRN
  Administered 2022-10-13: 100 mL via INTRAVENOUS

## 2022-10-13 MED ORDER — LACTATED RINGERS IV SOLN: INTRAVENOUS | Status: DC

## 2022-10-13 MED ORDER — SODIUM CHLORIDE (PF) 0.9 % IJ SOLN
INTRAMUSCULAR | Status: AC
  Filled 2022-10-13: qty 50

## 2022-10-13 NOTE — ED Triage Notes (Signed)
Pt BIB EMS with c/o ABD pain that has been ongoing for the last 2 years. Pt endorses n/v for the past 4 days.

## 2022-10-13 NOTE — ED Notes (Signed)
RN attempted IV insertion x2.  

## 2022-10-13 NOTE — ED Notes (Signed)
Pt said she can't use the bathroom right now

## 2022-10-13 NOTE — ED Provider Notes (Signed)
[place] EMERGENCY DEPARTMENT AT [facility] Provider Note   CSN: [id number] Arrival date & time: 10/13/22  1716     History  Chief Complaint  Patient presents with   Abdominal Pain    Linda Mercado is a 57 y.o. female.  57 year old female presents with worsening chronic abdominal pain.  States that for months of weeks she is not able to keep anything down.  Endorses colicky mid epigastric pain.  Her emesis has been orange.  Denies any fever.  Does have history of chronic pain and is on opiate pump.  Denies any constipation.  No urinary symptoms.  Called EMS and was transported here       Home Medications Prior to Admission medications   Medication Sig Start Date End Date Taking? Authorizing Provider  albuterol (VENTOLIN HFA) 108 (90 Base) MCG/ACT inhaler INHALE TWO PUFFS BY MOUTH EVERY 6 HOURS AS NEEDED FOR WHEEZING OR FOR SHORTNESS OF BREATH 10/07/22   [name], FNP  Alcohol Swabs PADS Use as directed to check blood sugars 01/30/21   [name], FNP  atenolol (TENORMIN) 100 MG tablet TAKE ONE TABLET BY MOUTH DAILY AT 9AM 09/17/22   [name], FNP  atorvastatin (LIPITOR) 10 MG tablet TAKE ONE TABLET BY MOUTH DAILY AT 9PM AT BEDTIME 09/17/22   [name], FNP  Blood Glucose Monitoring Suppl (ONETOUCH VERIO) w/Device KIT Use as directed to check 1 time per day dx: e11.65 02/07/21   [name], FNP  calcium gluconate 500 MG tablet Take 1 tablet (500 mg total) by mouth 3 (three) times daily. 01/30/21   [name], FNP  CAPLYTA 42 MG CAPS Take 42 mg by mouth at bedtime.  03/19/19   [provider]  Carbamazepine (EQUETRO) 300 MG CP12 Take by mouth. Patient not taking: Reported on 10/09/2022 12/20/20   [provider]  clonazePAM (KLONOPIN) 1 MG tablet Take 1 mg by mouth 3 (three) times daily. Patient not taking: Reported on 08/20/2022    [provider]  cyclobenzaprine (FLEXERIL) 10 MG tablet Take 1 tablet (10 mg total) by mouth 2  (two) times daily as needed for muscle spasms. 08/27/22   Marcello Fennel, PA-C  desvenlafaxine (PRISTIQ) 100 MG 24 hr tablet Take 100 mg by mouth every morning. 08/07/22   [provider]  dexlansoprazole (DEXILANT) 60 MG capsule Take 1 capsule (60 mg total) by mouth daily. Patient will need appt for further refills 02/11/22   [name], FNP  diclofenac Sodium (VOLTAREN) 1 % GEL APPLY 4 GRAMS TO THE AFFECTED AREA FOUR TIMES DAILY 05/22/20   [name], FNP  docusate sodium (COLACE) 50 MG capsule Take 50 mg by mouth daily.     [provider]  FLOVENT HFA 110 MCG/ACT inhaler INHALE TWO PUFFS BY MOUTH INTO LUNGS DAILY (BULK) 05/22/22   [name], FNP  fluconazole (DIFLUCAN) 100 MG tablet Take 1 tablet (100 mg total) by mouth daily. Take 1 tablet by mouth now repeat in 5 days Patient not taking: Reported on 10/09/2022 10/10/21   [name], FNP  Glucagon (GVOKE HYPOPEN 2-PACK) 0.5 MG/0.1ML SOAJ Inject 1 each into the skin as needed. 12/31/21   [name], FNP  GNP ULTICARE PEN NEEDLES 32G X 4 MM MISC USE AS DIRECTED WITH TRESIBA DAILY (BULK) 05/23/22   [name], FNP  hydrochlorothiazide (HYDRODIURIL) 12.5 MG tablet TAKE ONE TABLET BY MOUTH DAILY AT 9AM 09/17/22   [name], FNP  HYDROmorphone (DILAUDID) 4 MG tablet Take 4  mg by mouth 4 (four) times daily as needed for severe pain.    [provider]  hydrOXYzine (ATARAX/VISTARIL) 25 MG tablet Take 50 mg by mouth in the morning, at noon, in the evening, and at bedtime.    [provider]  lidocaine (XYLOCAINE) 2 % solution Use as directed 15 mLs in the mouth or throat 3 (three) times a week.  04/19/19   [provider]  Lidocaine 4 % PTCH Place 1 patch onto the skin in the morning and at bedtime. 05/22/20   [name], FNP  LORazepam (ATIVAN) 2 MG tablet Take 2 mg by mouth 4 (four) times daily as needed for anxiety.     [provider]  losartan (COZAAR) 25 MG tablet TAKE ONE  TABLET BY MOUTH DAILY AT 9AM 09/17/22   [name], FNP  lubiprostone (AMITIZA) 24 MCG capsule Take by mouth.    [provider]  meclizine (ANTIVERT) 12.5 MG tablet TAKE ONE TABLET BY MOUTH THREE TIMES DAILY AS NEEDED FOR DIZZINESS (VIAL) 05/21/22   [name], FNP  meloxicam (MOBIC) 15 MG tablet Take 15 mg by mouth daily. 08/08/21   [provider]  Multiple Vitamin (MULTIVITAMIN WITH MINERALS) TABS tablet Take 1 tablet by mouth daily.     [provider]  NARCAN 4 MG/0.1ML LIQD nasal spray kit Place 1 spray into the nose as directed. 01/19/18   [provider]  Norgestimate-Ethinyl Estradiol Triphasic (TRI-ESTARYLLA) 0.18/0.215/0.25 MG-35 MCG tablet Take 1 tablet by mouth daily. 08/20/22   [name], FNP  ondansetron (ZOFRAN-ODT) 8 MG disintegrating tablet Take 1 tablet (8 mg total) by mouth every 8 (eight) hours as needed for nausea or vomiting. 8mg  ODT q4 hours prn nausea 02/19/22   [name], FNP  OneTouch Delica Lancets 99991111 MISC Use as directed to check 1 time per day dx: e11.65 02/07/21   [name], FNP  [facility] VERIO test strip Use as directed to check 1 time per day dx: e11.65 02/07/21   [name], FNP  PAIN MANAGEMENT INTRATHECAL, IT, PUMP 1 each by Intrathecal route continuous. Intrathecal (IT) medication: fentanyl, clonidine, bupivicaine  Fentanyl 250mg /bupivicaine150mg /clonidine2mg   51.97mg  per day, 2mg  per day, 1.72mcg 15.1ml/qmin    [provider]  polyethylene glycol powder (GLYCOLAX/MIRALAX) 17 GM/SCOOP powder MIX 17 GRAMS IN 8 OUNCES OF LIQUID AND DRINK DAILY FOR BOWELS 09/24/22   [name], FNP  QUEtiapine (SEROQUEL XR) 400 MG 24 hr tablet Take 400 mg by mouth 2 (two) times daily.    [provider]  QUEtiapine (SEROQUEL) 300 MG tablet Take 300 mg by mouth at bedtime.    [provider]  sitaGLIPtin-metformin (JANUMET) 50-1000 MG tablet Take 1 tablet by mouth daily with breakfast. 08/27/22   [name], FNP  sucralfate (CARAFATE) 1 GM/10ML suspension TAKE 10 ML BY MOUTH TWICE DAILY (BULK) Patient not taking: Reported on 10/09/2022 01/24/22   [name], FNP  tiZANidine (ZANAFLEX) 4 MG tablet Take 4 mg by mouth every 4 (four) hours. 08/18/19   [provider]  traZODone (DESYREL) 100 MG tablet 5 qhs prescribed by psychiatry Patient taking differently: Take 100-200 mg by mouth at bedtime. 5 qhs prescribed by psychiatry 02/18/19   Rodriguez-Southworth, [name], PA-C  triamcinolone cream (KENALOG) 0.1 % Apply 1 application topically daily as needed (for itching). 03/08/19   Rodriguez-Southworth, [name], PA-C  TRINTELLIX 20 MG TABS tablet Take 20 mg by mouth daily. Patient not taking: Reported on 08/20/2022 02/22/20   [provider]  Allergies    Lisinopril, Gabapentin, Latex, Morphine and related, and Adhesive [tape]    Review of Systems   Review of Systems  All other systems reviewed and are negative.   Physical Exam Updated Vital Signs BP (!) 147/88   Pulse 76   Temp (!) 97.5 F (36.4 C) (Oral)   Resp 18   Ht 1.575 m (5\' 2" )   Wt 55.3 kg   SpO2 100%   BMI 22.31 kg/m  Physical Exam Vitals and nursing note reviewed.  Constitutional:      General: She is not in acute distress.    Appearance: Normal appearance. She is well-developed. She is not toxic-appearing.  HENT:     Head: Normocephalic and atraumatic.  Eyes:     General: Lids are normal.     Conjunctiva/sclera: Conjunctivae normal.     Pupils: Pupils are equal, round, and reactive to light.  Neck:     Thyroid: No thyroid mass.     Trachea: No tracheal deviation.  Cardiovascular:     Rate and Rhythm: Normal rate and regular rhythm.     Heart sounds: Normal heart sounds. No murmur heard.    No gallop.  Pulmonary:     Effort: Pulmonary effort is normal. No respiratory distress.     Breath sounds: Normal breath sounds. No stridor. No decreased breath sounds, wheezing, rhonchi or rales.   Abdominal:     General: There is no distension.     Palpations: Abdomen is soft.     Tenderness: There is generalized abdominal tenderness. There is no rebound.  Musculoskeletal:        General: No tenderness. Normal range of motion.     Cervical back: Normal range of motion and neck supple.  Skin:    General: Skin is warm and dry.     Findings: No abrasion or rash.  Neurological:     Mental Status: She is alert and oriented to person, place, and time. Mental status is at baseline.     GCS: GCS eye subscore is 4. GCS verbal subscore is 5. GCS motor subscore is 6.     Cranial Nerves: No cranial nerve deficit.     Sensory: No sensory deficit.     Motor: Motor function is intact.  Psychiatric:        Attention and Perception: Attention normal.        Speech: Speech normal.        Behavior: Behavior normal.     ED Results / Procedures / Treatments   Labs (all labs ordered are listed, but only abnormal results are displayed) Labs Reviewed  CBC WITH DIFFERENTIAL/PLATELET  COMPREHENSIVE METABOLIC PANEL  LIPASE, BLOOD  URINALYSIS, ROUTINE W REFLEX MICROSCOPIC    EKG None  Radiology No results found.  Procedures Procedures    Medications Ordered in ED Medications  lactated ringers bolus 1,000 mL (has no administration in time range)  lactated ringers infusion (has no administration in time range)  metoCLOPramide (REGLAN) injection 10 mg (has no administration in time range)  fentaNYL (SUBLIMAZE) injection 100 mcg (has no administration in time range)    ED Course/ Medical Decision Making/ A&P                             Medical Decision Making Amount and/or Complexity of Data Reviewed Labs: ordered. Radiology: ordered.  Risk Prescription drug management.   Patient medicated for pain at this time.  Concern  for possible intra-abdominal process and abdominal CT performed which shows constipation for interpretation.  Here are reassuring.  No leukocytosis on CBC.   Urinalysis negative.  Patient reexamined is comfortable at this time.  Will be discharged home at this time        Final Clinical Impression(s) / ED Diagnoses Final diagnoses:  None    Rx / DC Orders ED Discharge Orders     None         Lacretia Leigh, MD 10/13/22 2137

## 2022-10-14 ENCOUNTER — Telehealth: Payer: Self-pay

## 2022-10-14 NOTE — Transitions of Care (Post Inpatient/ED Visit) (Signed)
   10/14/2022  Name: Linda Mercado MRN: [id number] DOB: 05-Sep-1965  Today's TOC FU Call Status: Today's TOC FU Call Status:: Successful TOC FU Call Competed TOC FU Call Complete Date: 10/14/22  Transition Care Management Follow-up Telephone Call Date of Discharge: 10/13/22 Discharge Facility: [facility]) Type of Discharge: Emergency Department Reason for ED Visit: Other: How have you been since you were released from the hospital?: Same Any questions or concerns?: No  Items Reviewed: Did you receive and understand the discharge instructions provided?: Yes Medications obtained and verified?: Yes (Medications Reviewed) Any new allergies since your discharge?: No Dietary orders reviewed?: No Do you have support at home?: Yes People in Home: child(ren), dependent  Home Care and Equipment/Supplies: [place] Ordered?: No Any new equipment or medical supplies ordered?: No  Functional Questionnaire: Do you need assistance with bathing/showering or dressing?: No Do you need assistance with meal preparation?: No Do you need assistance with eating?: No Do you have difficulty maintaining continence: No Do you need assistance with getting out of bed/getting out of a chair/moving?: No Do you have difficulty managing or taking your medications?: No  Follow up appointments reviewed: PCP Follow-up appointment confirmed?: Yes Date of PCP follow-up appointment?: 10/16/22 Follow-up Provider: [facility] Follow-up appointment confirmed?: No Reason Specialist Follow-Up Not Confirmed: Patient has Specialist Provider Number and will Call for Appointment Do you need transportation to your follow-up appointment?: No Do you understand care options if your condition(s) worsen?: Yes-patient verbalized understanding    SIGNATURE Tami Lin

## 2022-10-17 ENCOUNTER — Telehealth: Payer: Self-pay

## 2022-10-17 ENCOUNTER — Inpatient Hospital Stay: Payer: Medicaid Other | Admitting: Nurse Practitioner

## 2022-10-19 ENCOUNTER — Other Ambulatory Visit: Payer: Self-pay | Admitting: Nurse Practitioner

## 2022-10-22 ENCOUNTER — Telehealth: Payer: Self-pay

## 2022-10-22 MED ORDER — FLUTICASONE FUROATE-VILANTEROL 100-25 MCG/ACT IN AEPB: 1.0000 | INHALATION_SPRAY | Freq: Every day | RESPIRATORY_TRACT | 5 refills | Status: DC

## 2022-10-22 NOTE — Telephone Encounter (Signed)
Received fax from [facility] that patient's Flovent is no longer a covered option. They sent a list of preferred alternatives. Per Doreene Burke patient can start Breo.  Spoke with patient and advised. Rx sent to pharmacy.

## 2022-10-24 ENCOUNTER — Telehealth: Payer: Self-pay | Admitting: Gastroenterology

## 2022-10-24 NOTE — Telephone Encounter (Addendum)
Hi Dr. Tomasa Randunningham,  Supervising Provider: 10/24/22-PM   We received a referral for patent to be evaluated for vomiting without nausea. She does have GI history with Dr. Elnoria HowardHung seeking to transfer her care to [facility]. Her records are available in media for you to review and advise on scheduling.   Thanks

## 2022-10-29 NOTE — Telephone Encounter (Signed)
Called patient to advise and schedule mailbox is full.

## 2022-11-08 ENCOUNTER — Other Ambulatory Visit: Payer: Self-pay | Admitting: Nurse Practitioner

## 2022-11-13 ENCOUNTER — Ambulatory Visit: Payer: Medicaid Other | Admitting: Nurse Practitioner

## 2022-11-14 ENCOUNTER — Encounter (HOSPITAL_COMMUNITY): Payer: Self-pay

## 2022-11-14 ENCOUNTER — Other Ambulatory Visit: Payer: Self-pay | Admitting: Nurse Practitioner

## 2022-11-18 ENCOUNTER — Telehealth: Payer: Self-pay

## 2022-11-18 NOTE — Telephone Encounter (Signed)
Call for patient to send updated picture of insurance card via mychart for her PA. Patient stated she would send a picture.

## 2022-11-25 ENCOUNTER — Encounter: Payer: Self-pay | Admitting: Gastroenterology

## 2022-12-03 ENCOUNTER — Emergency Department (HOSPITAL_BASED_OUTPATIENT_CLINIC_OR_DEPARTMENT_OTHER)
Admission: EM | Admit: 2022-12-03 | Discharge: 2022-12-03 | Disposition: A | Discharge status: Home / Self Care | Payer: 59 | Attending: Emergency Medicine | Admitting: Emergency Medicine

## 2022-12-03 ENCOUNTER — Encounter (HOSPITAL_BASED_OUTPATIENT_CLINIC_OR_DEPARTMENT_OTHER): Payer: Self-pay

## 2022-12-03 ENCOUNTER — Other Ambulatory Visit: Payer: Self-pay

## 2022-12-03 ENCOUNTER — Emergency Department (HOSPITAL_BASED_OUTPATIENT_CLINIC_OR_DEPARTMENT_OTHER): Payer: 59

## 2022-12-03 ENCOUNTER — Emergency Department (HOSPITAL_BASED_OUTPATIENT_CLINIC_OR_DEPARTMENT_OTHER): Payer: 59 | Admitting: Radiology

## 2022-12-03 DIAGNOSIS — W0110XA Fall on same level from slipping, tripping and stumbling with subsequent striking against unspecified object, initial encounter: Secondary | ICD-10-CM | POA: Diagnosis not present

## 2022-12-03 DIAGNOSIS — R0781 Pleurodynia: Secondary | ICD-10-CM | POA: Diagnosis not present

## 2022-12-03 DIAGNOSIS — W19XXXA Unspecified fall, initial encounter: Secondary | ICD-10-CM

## 2022-12-03 DIAGNOSIS — R55 Syncope and collapse: Secondary | ICD-10-CM | POA: Diagnosis present

## 2022-12-03 DIAGNOSIS — Z91040 Latex allergy status: Secondary | ICD-10-CM | POA: Insufficient documentation

## 2022-12-03 DIAGNOSIS — S0990XA Unspecified injury of head, initial encounter: Secondary | ICD-10-CM | POA: Insufficient documentation

## 2022-12-03 LAB — CBC
HCT: 39.3 % (ref 36.0–46.0)
Hemoglobin: 12.6 g/dL (ref 12.0–15.0)
MCH: 26.6 pg (ref 26.0–34.0)
MCHC: 32.1 g/dL (ref 30.0–36.0)
MCV: 82.9 fL (ref 80.0–100.0)
Platelets: 401 10*3/uL — ABNORMAL HIGH (ref 150–400)
RBC: 4.74 MIL/uL (ref 3.87–5.11)
RDW: 13.6 % (ref 11.5–15.5)
WBC: 7.4 10*3/uL (ref 4.0–10.5)
nRBC: 0 % (ref 0.0–0.2)

## 2022-12-03 LAB — BASIC METABOLIC PANEL
Anion gap: 10 (ref 5–15)
BUN: 20 mg/dL (ref 6–20)
CO2: 30 mmol/L (ref 22–32)
Calcium: 9.7 mg/dL (ref 8.9–10.3)
Chloride: 99 mmol/L (ref 98–111)
Creatinine, Ser: 1.21 mg/dL — ABNORMAL HIGH (ref 0.44–1.00)
GFR, Estimated: 53 mL/min — ABNORMAL LOW (ref >60)
Glucose, Bld: 106 mg/dL — ABNORMAL HIGH (ref 70–99)
Potassium: 3.3 mmol/L — ABNORMAL LOW (ref 3.5–5.1)
Sodium: 139 mmol/L (ref 135–145)

## 2022-12-03 LAB — CBG MONITORING, ED: Glucose-Capillary: 132 mg/dL — ABNORMAL HIGH (ref 70–99)

## 2022-12-03 MED ORDER — HYDROMORPHONE HCL 1 MG/ML IJ SOLN
2.0000 mg | Freq: Once | INTRAMUSCULAR | Status: AC
  Administered 2022-12-03: 2 mg via INTRAMUSCULAR
  Filled 2022-12-03: qty 2

## 2022-12-03 NOTE — Discharge Instructions (Addendum)
Please follow-up with your primary care doctor for this episode of passing out. It is possible that you became dizzy from too much pain medicine.  Too many narcotic pain medications can drop your blood pressure and make you dizzy.  You will need to use incentive spirometer at home for the next 10 days.  You should use this every hour while you are awake.  You will take 10 slow breaths at a time.  This is an exercise to ensure that you do not develop pneumonia.  If you lose consciousness again, have difficulty breathing, begin having fevers, please return to the ER.

## 2022-12-03 NOTE — ED Provider Notes (Signed)
[place] EMERGENCY DEPARTMENT AT [facility] Provider Note   CSN: [id number] Arrival date & time: 12/03/22  1731     History {Add pertinent medical, surgical, social history, OB history to HPI:1} Chief Complaint  Patient presents with   Loss of Consciousness    Linda Mercado is a 57 y.o. female with history of chronic pain syndrome, implanted IT pump of fentanyl, clonidine, and bupivacaine, presenting to the emergency department with complaint of syncope and injury.  The patient reports that she was getting out of her car yesterday when she was standing up from the cars became very lightheaded and fell to the ground.  She reports she struck her left ribs on the ground as well as her forehead.  She is having pain with movement of the left side of her chest and her forehead.  She reports that she has been in touch with her pain management doctor and was told that the dosing on her IT pump was too high, set for 180 lb weight when she is 120 lbs now.  They have since adjusted her medication.  She does report that she felt that she was getting "an extra boost of pain medicine" at random times, including yesterday before she got out of the car.  She denies prior history of syncope to me.  Per my review of her pain management clinic [facility] procedural visit yesterday pt had pump reduced by 2%.  Dilaudid script also sent to [facility]  HPI     Home Medications Prior to Admission medications   Medication Sig Start Date End Date Taking? Authorizing Provider  albuterol (VENTOLIN HFA) 108 (90 Base) MCG/ACT inhaler INHALE TWO PUFFS BY MOUTH EVERY 6 HOURS AS NEEDED FOR WHEEZING OR FOR SHORTNESS OF BREATH 10/07/22   [name], FNP  Alcohol Swabs PADS Use as directed to check blood sugars 01/30/21   [name], FNP  atenolol (TENORMIN) 100 MG tablet TAKE ONE TABLET BY MOUTH DAILY AT 9AM 09/17/22   [name], FNP  atorvastatin (LIPITOR) 10 MG tablet TAKE ONE TABLET BY MOUTH  DAILY AT 9PM AT BEDTIME 09/17/22   [name], FNP  Blood Glucose Monitoring Suppl (ONETOUCH VERIO) w/Device KIT Use as directed to check 1 time per day dx: e11.65 02/07/21   [name], FNP  calcium gluconate 500 MG tablet Take 1 tablet (500 mg total) by mouth 3 (three) times daily. 01/30/21   [name], FNP  CAPLYTA 42 MG CAPS Take 42 mg by mouth at bedtime.  03/19/19   [provider]  Carbamazepine (EQUETRO) 300 MG CP12 Take by mouth. Patient not taking: Reported on 10/09/2022 12/20/20   [provider]  clonazePAM (KLONOPIN) 1 MG tablet Take 1 mg by mouth 3 (three) times daily. Patient not taking: Reported on 08/20/2022    [provider]  cyclobenzaprine (FLEXERIL) 10 MG tablet Take 1 tablet (10 mg total) by mouth 2 (two) times daily as needed for muscle spasms. 08/27/22   Carroll Sage, PA-C  desvenlafaxine (PRISTIQ) 100 MG 24 hr tablet Take 100 mg by mouth every morning. 08/07/22   [provider]  dexlansoprazole (DEXILANT) 60 MG capsule Take 1 capsule (60 mg total) by mouth daily. Patient will need appt for further refills 02/11/22   [name], FNP  diclofenac Sodium (VOLTAREN) 1 % GEL APPLY 4 GRAMS TO THE AFFECTED AREA FOUR TIMES DAILY 05/22/20   [name], FNP  docusate sodium (COLACE) 50 MG capsule Take 50 mg  by mouth daily.     [provider]  fluconazole (DIFLUCAN) 100 MG tablet Take 1 tablet (100 mg total) by mouth daily. Take 1 tablet by mouth now repeat in 5 days Patient not taking: Reported on 10/09/2022 10/10/21   [name], FNP  fluticasone furoate-vilanterol (BREO ELLIPTA) 100-25 MCG/ACT AEPB Inhale 1 puff into the lungs daily. 10/22/22   [name], FNP  Glucagon (GVOKE HYPOPEN 2-PACK) 0.5 MG/0.1ML SOAJ Inject 1 each into the skin as needed. 12/31/21   [name], FNP  GNP ULTICARE PEN NEEDLES 32G X 4 MM MISC USE AS DIRECTED WITH TRESIBA DAILY (BULK) 11/08/22   [name], FNP  hydrochlorothiazide (HYDRODIURIL)  12.5 MG tablet TAKE ONE TABLET BY MOUTH DAILY AT 9AM 09/17/22   [name], FNP  HYDROmorphone (DILAUDID) 4 MG tablet Take 4 mg by mouth 4 (four) times daily as needed for severe pain.    [provider]  hydrOXYzine (ATARAX/VISTARIL) 25 MG tablet Take 50 mg by mouth in the morning, at noon, in the evening, and at bedtime.    [provider]  lidocaine (XYLOCAINE) 2 % solution Use as directed 15 mLs in the mouth or throat 3 (three) times a week.  04/19/19   [provider]  Lidocaine 4 % PTCH Place 1 patch onto the skin in the morning and at bedtime. 05/22/20   [name], FNP  LORazepam (ATIVAN) 2 MG tablet Take 2 mg by mouth 4 (four) times daily as needed for anxiety.     [provider]  losartan (COZAAR) 25 MG tablet TAKE ONE TABLET BY MOUTH DAILY AT 9AM 09/17/22   [name], FNP  lubiprostone (AMITIZA) 24 MCG capsule Take by mouth.    [provider]  meclizine (ANTIVERT) 12.5 MG tablet TAKE ONE TABLET BY MOUTH THREE TIMES DAILY AS NEEDED FOR DIZZINESS (VIAL) 05/21/22   [name], FNP  meloxicam (MOBIC) 15 MG tablet Take 15 mg by mouth daily. 08/08/21   [provider]  Multiple Vitamin (MULTIVITAMIN WITH MINERALS) TABS tablet Take 1 tablet by mouth daily.     [provider]  NARCAN 4 MG/0.1ML LIQD nasal spray kit Place 1 spray into the nose as directed. 01/19/18   [provider]  Norgestimate-Ethinyl Estradiol Triphasic (TRI-ESTARYLLA) 0.18/0.215/0.25 MG-35 MCG tablet Take 1 tablet by mouth daily. 08/20/22   [name], FNP  ondansetron (ZOFRAN-ODT) 8 MG disintegrating tablet Take 1 tablet (8 mg total) by mouth every 8 (eight) hours as needed for nausea or vomiting. 8mg  ODT q4 hours prn nausea 02/19/22   [name], FNP  OneTouch Delica Lancets 33G MISC Use as directed to check 1 time per day dx: e11.65 02/07/21   [name], FNP  [facility] VERIO test strip Use as directed to check 1 time per day dx: e11.65  02/07/21   [name], FNP  PAIN MANAGEMENT INTRATHECAL, IT, PUMP 1 each by Intrathecal route continuous. Intrathecal (IT) medication: fentanyl, clonidine, bupivicaine  Fentanyl 250mg /bupivicaine150mg /clonidine2mg   51.97mg  per day, 2mg  per day, 1.74mcg 15.28ml/qmin    [provider]  polyethylene glycol powder (GLYCOLAX/MIRALAX) 17 GM/SCOOP powder MIX 17 GRAMS IN 8 OUNCES OF LIQUID AND DRINK DAILY FOR BOWELS 11/14/22   [name], FNP  QUEtiapine (SEROQUEL XR) 400 MG 24 hr tablet Take 400 mg by mouth 2 (two) times daily.    [provider]  QUEtiapine (SEROQUEL) 300 MG tablet Take 300 mg by mouth at bedtime.    [provider]  sitaGLIPtin-metformin (JANUMET) 50-1000  MG tablet Take 1 tablet by mouth daily with breakfast. 08/27/22   [name], FNP  sucralfate (CARAFATE) 1 GM/10ML suspension TAKE 10 ML BY MOUTH TWICE DAILY (BULK) Patient not taking: Reported on 10/09/2022 01/24/22   [name], FNP  tiZANidine (ZANAFLEX) 4 MG tablet Take 4 mg by mouth every 4 (four) hours. 08/18/19   [provider]  traZODone (DESYREL) 100 MG tablet 5 qhs prescribed by psychiatry Patient taking differently: Take 100-200 mg by mouth at bedtime. 5 qhs prescribed by psychiatry 02/18/19   Rodriguez-Southworth, Nettie Elm, PA-C  triamcinolone cream (KENALOG) 0.1 % Apply 1 application topically daily as needed (for itching). 03/08/19   Rodriguez-Southworth, Nettie Elm, PA-C  TRINTELLIX 20 MG TABS tablet Take 20 mg by mouth daily. Patient not taking: Reported on 08/20/2022 02/22/20   [provider]      Allergies    Lisinopril, Gabapentin, Latex, Morphine and related, and Adhesive [tape]    Review of Systems   Review of Systems  Physical Exam Updated Vital Signs BP (!) 161/93   Pulse 67   Temp 98.6 F (37 C)   Resp (!) 23   Ht 5\' 2"  (1.575 m)   Wt 53.5 kg   SpO2 100%   BMI 21.58 kg/m  Physical Exam Constitutional:      General: She is not in acute distress. HENT:      Head: Normocephalic and atraumatic.  Eyes:     Conjunctiva/sclera: Conjunctivae normal.     Pupils: Pupils are equal, round, and reactive to light.  Cardiovascular:     Rate and Rhythm: Normal rate and regular rhythm.  Pulmonary:     Effort: Pulmonary effort is normal. No respiratory distress.  Abdominal:     General: There is no distension.     Tenderness: There is no abdominal tenderness.  Musculoskeletal:     Comments: Diffuse left lower rib line tenderness  Skin:    General: Skin is warm and dry.  Neurological:     General: No focal deficit present.     Mental Status: She is alert. Mental status is at baseline.  Psychiatric:        Mood and Affect: Mood normal.        Behavior: Behavior normal.     ED Results / Procedures / Treatments   Labs (all labs ordered are listed, but only abnormal results are displayed) Labs Reviewed  BASIC METABOLIC PANEL - Abnormal; Notable for the following components:      Result Value   Potassium 3.3 (*)    Glucose, Bld 106 (*)    Creatinine, Ser 1.21 (*)    GFR, Estimated 53 (*)    All other components within normal limits  CBC - Abnormal; Notable for the following components:   Platelets 401 (*)    All other components within normal limits  CBG MONITORING, ED - Abnormal; Notable for the following components:   Glucose-Capillary 132 (*)    All other components within normal limits    EKG EKG Interpretation  Date/Time:  Tuesday Dec 03 2022 17:41:43 EDT Ventricular Rate:  67 PR Interval:  128 QRS Duration: 82 QT Interval:  414 QTC Calculation: 437 R Axis:   73 Text Interpretation: Normal sinus rhythm No sig changes from prior tracing Oct 06 2019 Confirmed by Alvester Chou [phone]) on 12/03/2022 7:31:25 PM  Radiology DG Ribs Unilateral W/Chest Left  Result Date: 12/03/2022 CLINICAL DATA:  Fall.  Syncope episode.  Left rib pain EXAM: LEFT RIBS AND CHEST -  3+ VIEW COMPARISON:  None Available. FINDINGS: No fracture or other  bone lesions are seen involving the ribs. There is no evidence of pneumothorax or pleural effusion. Both lungs are clear. Heart size and mediastinal contours are within normal limits. IMPRESSION: Negative. Electronically Signed   By: Larose Hires D.O.   On: 12/03/2022 18:27    Procedures Procedures  {Document cardiac monitor, telemetry assessment procedure when appropriate:1}  Medications Ordered in ED Medications  HYDROmorphone (DILAUDID) injection 2 mg (has no administration in time range)    ED Course/ Medical Decision Making/ A&P   {   Click here for ABCD2, HEART and other calculatorsREFRESH Note before signing :1}                          Medical Decision Making Amount and/or Complexity of Data Reviewed Labs: ordered. Radiology: ordered.  Risk Prescription drug management.   This patient presents to the ED with concern for syncope versus near syncope episode and injury to the head and left ribs. This involves an extensive number of treatment options, and is a complaint that carries with it a high risk of complications and morbidity.  The differential diagnosis includes contusion versus concussion versus rib fracture versus ICH versus other.  I strongly suspect that her episode of lightheadedness or syncope versus near syncope, which she reports lasted only few seconds, was likely related to overmedication from her extensive pain regimen and intrathecal pump.  The dose was reduced yesterday per my review of external records.  I have much lower suspicion for PE, ACS, or other life-threatening cause of syncope.  Doubt seizure.  Co-morbidities that complicate the patient evaluation: Chronic pain syndrome, difficulty manage pain.  External records from outside source obtained and reviewed including pain mgmt clinic note yesterday  I ordered and personally interpreted labs.  The pertinent results include: Basic labs are unremarkable.  No acute anemia.  No evidence of significant  dehydration  I ordered imaging studies including CT of the head and chest, x-ray of the chest I independently visualized and interpreted imaging which showed *** I agree with the radiologist interpretation  The patient was maintained on a cardiac monitor.  I personally viewed and interpreted the cardiac monitored which showed an underlying rhythm of: Sinus rhythm with no evidence of significant arhythmia  Per my interpretation the patient's ECG shows sinus rhythm without acute ischemic findings  I ordered medication including IM Dilaudid for pain  I have reviewed the patients home medicines and have made adjustments as needed  After the interventions noted above, I reevaluated the patient and found that they have: {resolved/improved/worsened:23923::"improved"}  Social Determinants of Health:***  Dispostion:  After consideration of the diagnostic results and the patients response to treatment, I feel that the patent would benefit from ***.   {Document critical care time when appropriate:1} {Document review of labs and clinical decision tools ie heart score, Chads2Vasc2 etc:1}  {Document your independent review of radiology images, and any outside records:1} {Document your discussion with family members, caretakers, and with consultants:1} {Document social determinants of health affecting pt's care:1} {Document your decision making why or why not admission, treatments were needed:1} Final Clinical Impression(s) / ED Diagnoses Final diagnoses:  None    Rx / DC Orders ED Discharge Orders     None

## 2022-12-03 NOTE — ED Triage Notes (Addendum)
Patient here POV from Home.  Endorses Syncopal Episode yesterday when she raised herself out of the car and had LOC for a few seconds. Head Injury and left Sided Rib Pain from Fall. No Anticoagulants.  Sent by UC for Evaluation.   NAD Noted during Triage. A&Ox4. GCS 15. Ambulatory.

## 2022-12-03 NOTE — ED Notes (Signed)
RN reviewed discharge instructions with pt. Pt educated on the use of the incentive spirometer to prevent pneumonia. Pt verbalized understanding and had no further questions. VSS upon discharge.

## 2022-12-05 ENCOUNTER — Other Ambulatory Visit: Payer: Self-pay | Admitting: Nurse Practitioner

## 2022-12-06 ENCOUNTER — Telehealth: Payer: Self-pay

## 2022-12-11 ENCOUNTER — Ambulatory Visit (INDEPENDENT_AMBULATORY_CARE_PROVIDER_SITE_OTHER): Payer: 59 | Admitting: Family Medicine

## 2022-12-11 ENCOUNTER — Encounter: Payer: Self-pay | Admitting: Family Medicine

## 2022-12-11 VITALS — BP 100/60 | HR 68 | Temp 98.4°F | Ht 62.0 in | Wt 117.0 lb

## 2022-12-11 DIAGNOSIS — R0781 Pleurodynia: Secondary | ICD-10-CM | POA: Diagnosis not present

## 2022-12-11 DIAGNOSIS — Z5320 Procedure and treatment not carried out because of patient's decision for unspecified reasons: Secondary | ICD-10-CM | POA: Diagnosis not present

## 2022-12-11 DIAGNOSIS — Z79899 Other long term (current) drug therapy: Secondary | ICD-10-CM

## 2022-12-11 DIAGNOSIS — Z308 Encounter for other contraceptive management: Secondary | ICD-10-CM

## 2022-12-11 DIAGNOSIS — W19XXXD Unspecified fall, subsequent encounter: Secondary | ICD-10-CM

## 2022-12-11 DIAGNOSIS — W19XXXA Unspecified fall, initial encounter: Secondary | ICD-10-CM | POA: Insufficient documentation

## 2022-12-11 MED ORDER — NORGESTIM-ETH ESTRAD TRIPHASIC 0.18/0.215/0.25 MG-35 MCG PO TABS: 1.0000 | ORAL_TABLET | Freq: Every day | ORAL | 1 refills | Status: DC

## 2022-12-11 NOTE — Progress Notes (Signed)
I,Jameka J Llittleton,acting as a Neurosurgeon for [employer], NP.,have documented all relevant documentation on the behalf of Surya Schroeter, NP,as directed by  Scottie Metayer Moshe Salisbury, NP while in the presence of Johnedward Brodrick, NP.    Subjective:     Patient ID: Linda Mercado , female    DOB: 09/28/1965 , 57 y.o.   MRN: [id number]   Chief Complaint  Patient presents with   Fall    HPI  Patient presents today for follow from ED visit for a fall that she had on 11/29/2021 due to an episode of lightheadedness.In  the ED on 12/03/2022 she had X-ray of her chest, CT chest , CT Head and they all showed no abnormality and nothing acute. She reports that she fell and hit her left side on concrete when she was getting out of her car . Patient has an intrathecal pain for cervical spondolysis,failed surgical back syndrome, her pain is managed at [facility] in [place]. Patient  states she went to the pain clinic on 12/02/2022, to complain of lightheadedness  , she was told that the reason that she feels that way at home because she had "too much"pain medication.They stated her pump was set to deliver medication strength for someone who weighs 180 lbs but she weighs less than 120 lbs so  her pump readjusted   Fall The accident occurred 5 to 7 days ago. Fall occurred: when get out from the car. Point of impact: left side. The pain is present in the neck. The pain is at a severity of 7/10. The pain is moderate. The symptoms are aggravated by ambulation, pressure on injury and sitting. She has tried rest for the symptoms. The treatment provided mild relief.     Past Medical History:  Diagnosis Date   Acid reflux    Bradycardia    Chronic back pain    Diabetes mellitus without complication (HCC)    "prediabetes", A1C down to 5 - reported on 10/11/21   Hypertension    Hypokalemia      Family History  Problem Relation Age of Onset   Kidney disease Mother    Hypertension Mother    Diabetes Father     Hypertension Father    Bipolar disorder Sister    Schizophrenia Sister    Neuropathy Sister    Hypertension Sister    Bipolar disorder Daughter    Post-traumatic stress disorder Daughter    Asthma Daughter    Depression Daughter    Bipolar disorder Daughter    Post-traumatic stress disorder Daughter    Asthma Daughter    Depression Daughter    Asthma Daughter    Depression Daughter    Asthma Daughter    Breast cancer Paternal Grandmother    Bipolar disorder Brother    Bipolar disorder Brother    Schizophrenia Brother    Stroke Neg Hx    Cancer Neg Hx    CAD Neg Hx      Current Outpatient Medications:    albuterol (VENTOLIN HFA) 108 (90 Base) MCG/ACT inhaler, INHALE TWO PUFFS BY MOUTH EVERY 6 HOURS AS NEEDED FOR WHEEZING or shortness of breath, Disp: 20.1 g, Rfl: 0   Alcohol Swabs PADS, Use as directed to check blood sugars, Disp: 100 each, Rfl: 2   atenolol (TENORMIN) 100 MG tablet, TAKE ONE TABLET BY MOUTH DAILY AT 9AM, Disp: 30 tablet, Rfl: 11   atorvastatin (LIPITOR) 10 MG tablet, TAKE ONE TABLET BY MOUTH DAILY AT 9PM AT BEDTIME,  Disp: 90 tablet, Rfl: 11   Blood Glucose Monitoring Suppl (ONETOUCH VERIO) w/Device KIT, Use as directed to check 1 time per day dx: e11.65, Disp: 1 kit, Rfl: 1   calcium gluconate 500 MG tablet, Take 1 tablet (500 mg total) by mouth 3 (three) times daily., Disp: 270 tablet, Rfl: 1   CAPLYTA 42 MG CAPS, Take 42 mg by mouth at bedtime. , Disp: , Rfl:    Carbamazepine (EQUETRO) 300 MG CP12, Take by mouth., Disp: , Rfl:    clonazePAM (KLONOPIN) 1 MG tablet, Take 1 mg by mouth 3 (three) times daily., Disp: , Rfl:    cyclobenzaprine (FLEXERIL) 10 MG tablet, Take 1 tablet (10 mg total) by mouth 2 (two) times daily as needed for muscle spasms., Disp: 20 tablet, Rfl: 0   desvenlafaxine (PRISTIQ) 100 MG 24 hr tablet, Take 100 mg by mouth every morning., Disp: , Rfl:    dexlansoprazole (DEXILANT) 60 MG capsule, Take 1 capsule (60 mg total) by mouth daily.  Patient will need appt for further refills, Disp: 90 capsule, Rfl: 1   diclofenac Sodium (VOLTAREN) 1 % GEL, APPLY 4 GRAMS TO THE AFFECTED AREA FOUR TIMES DAILY, Disp: 400 g, Rfl: 1   docusate sodium (COLACE) 50 MG capsule, Take 50 mg by mouth daily. , Disp: , Rfl:    fluconazole (DIFLUCAN) 100 MG tablet, Take 1 tablet (100 mg total) by mouth daily. Take 1 tablet by mouth now repeat in 5 days, Disp: 2 tablet, Rfl: 0   Glucagon (GVOKE HYPOPEN 2-PACK) 0.5 MG/0.1ML SOAJ, Inject 1 each into the skin as needed., Disp: 0.2 mL, Rfl: 2   GNP ULTICARE PEN NEEDLES 32G X 4 MM MISC, USE AS DIRECTED WITH TRESIBA DAILY (BULK), Disp: 100 each, Rfl: 1   hydrochlorothiazide (HYDRODIURIL) 12.5 MG tablet, TAKE ONE TABLET BY MOUTH DAILY AT 9AM, Disp: 90 tablet, Rfl: 11   HYDROmorphone (DILAUDID) 4 MG tablet, Take 4 mg by mouth 4 (four) times daily as needed for severe pain., Disp: , Rfl:    hydrOXYzine (ATARAX/VISTARIL) 25 MG tablet, Take 50 mg by mouth in the morning, at noon, in the evening, and at bedtime., Disp: , Rfl:    lidocaine (XYLOCAINE) 2 % solution, Use as directed 15 mLs in the mouth or throat 3 (three) times a week. , Disp: , Rfl:    Lidocaine 4 % PTCH, Place 1 patch onto the skin in the morning and at bedtime., Disp: 60 patch, Rfl: 5   LORazepam (ATIVAN) 2 MG tablet, Take 2 mg by mouth 4 (four) times daily as needed for anxiety. , Disp: , Rfl:    losartan (COZAAR) 25 MG tablet, TAKE ONE TABLET BY MOUTH DAILY AT 9AM, Disp: 30 tablet, Rfl: 11   lubiprostone (AMITIZA) 24 MCG capsule, Take by mouth., Disp: , Rfl:    meclizine (ANTIVERT) 12.5 MG tablet, TAKE ONE TABLET BY MOUTH THREE TIMES DAILY AS NEEDED FOR DIZZINESS (VIAL), Disp: 90 tablet, Rfl: 11   meloxicam (MOBIC) 15 MG tablet, Take 15 mg by mouth daily., Disp: , Rfl:    Multiple Vitamin (MULTIVITAMIN WITH MINERALS) TABS tablet, Take 1 tablet by mouth daily. , Disp: , Rfl:    NARCAN 4 MG/0.1ML LIQD nasal spray kit, Place 1 spray into the nose as  directed., Disp: , Rfl: 0   ondansetron (ZOFRAN-ODT) 8 MG disintegrating tablet, Take 1 tablet (8 mg total) by mouth every 8 (eight) hours as needed for nausea or vomiting. 8mg  ODT q4 hours prn nausea,  Disp: 20 tablet, Rfl: 0   OneTouch Delica Lancets 33G MISC, Use as directed to check 1 time per day dx: e11.65, Disp: 100 each, Rfl: 3   ONETOUCH VERIO test strip, Use as directed to check 1 time per day dx: e11.65, Disp: 100 each, Rfl: 3   PAIN MANAGEMENT INTRATHECAL, IT, PUMP, 1 each by Intrathecal route continuous. Intrathecal (IT) medication: fentanyl, clonidine, bupivicaine  Fentanyl 250mg /bupivicaine150mg /clonidine2mg   51.97mg  per day, 2mg  per day, 1.58mcg 15.30ml/qmin, Disp: , Rfl:    QUEtiapine (SEROQUEL XR) 400 MG 24 hr tablet, Take 400 mg by mouth 2 (two) times daily., Disp: , Rfl:    QUEtiapine (SEROQUEL) 300 MG tablet, Take 300 mg by mouth at bedtime., Disp: , Rfl:    sitaGLIPtin-metformin (JANUMET) 50-1000 MG tablet, Take 1 tablet by mouth daily with breakfast., Disp: 90 tablet, Rfl: 1   sucralfate (CARAFATE) 1 GM/10ML suspension, TAKE 10 ML BY MOUTH TWICE DAILY (BULK), Disp: 420 mL, Rfl: 1   tiZANidine (ZANAFLEX) 4 MG tablet, Take 4 mg by mouth every 4 (four) hours., Disp: , Rfl:    traZODone (DESYREL) 100 MG tablet, 5 qhs prescribed by psychiatry (Patient taking differently: Take 100-200 mg by mouth at bedtime. 5 qhs prescribed by psychiatry), Disp: 30 tablet, Rfl: 0   triamcinolone cream (KENALOG) 0.1 %, Apply 1 application topically daily as needed (for itching)., Disp: 30 g, Rfl: 2   TRINTELLIX 20 MG TABS tablet, Take 20 mg by mouth daily., Disp: , Rfl:    budesonide-formoterol (SYMBICORT) 160-4.5 MCG/ACT inhaler, Inhale 2 puffs into the lungs as directed., Disp: 1 each, Rfl: 2   Norgestimate-Ethinyl Estradiol Triphasic (TRI-ESTARYLLA) 0.18/0.215/0.25 MG-35 MCG tablet, Take 1 tablet by mouth daily., Disp: 84 tablet, Rfl: 1   polyethylene glycol powder (GLYCOLAX/MIRALAX) 17 GM/SCOOP  powder, MIX 17 GRAMS IN 8 OUNCES OF LIQUID AND DRINK DAILY FOR BOWELS, Disp: 238 g, Rfl: 1   Allergies  Allergen Reactions   Lisinopril Hives    Other reaction(s): anaphylaxis/angioedema   Gabapentin     Other Reaction(s): Nausea and Vomiting, Rash, Nausea and Vomiting, Rash   Latex Hives   Morphine And Codeine     Itching, skin get hot.    Adhesive [Tape] Rash     Review of Systems  Constitutional: Negative.   Eyes: Negative.   Respiratory: Negative.    Musculoskeletal:  Positive for back pain.  Skin: Negative.   Psychiatric/Behavioral: Negative.       Today's Vitals   12/11/22 1608  BP: 100/60  Pulse: 68  Temp: 98.4 F (36.9 C)  Weight: 117 lb (53.1 kg)  Height: 5\' 2"  (1.575 m)  PainSc: 10-Worst pain ever   Body mass index is 21.4 kg/m.  The ASCVD Risk score (Arnett DK, et al., 2019) failed to calculate for the following reasons:   The valid total cholesterol range is 130 to 320 mg/dL ++ Objective:  Physical Exam Cardiovascular:     Rate and Rhythm: Normal rate and regular rhythm.  Pulmonary:     Breath sounds: Normal breath sounds.  Musculoskeletal:        General: Tenderness present.     Cervical back: Tenderness present.  Skin:    General: Skin is warm and dry.         Assessment And Plan:     1. Fall, subsequent encounter Comments: Larey Seat on 5/11: Had Chest Xray,CT Chest 12/03/2022;no acute findings  2. Rib pain on left side Comments: Apply Warm compress to affected side;rest advised  3.  Encounter for other contraceptive management Comments: Continue current oral contracentive pills  4. Treatment declined by patient Comments: Offered referral to Physical due to frequent falls by patient but she refused  5. Polypharmacy Comments: Referral for medication Management    Return if symptoms worsen or fail to improve, for keep appointment.  Patient was given opportunity to ask questions. Patient verbalized understanding of the plan and was able to  repeat key elements of the plan. All questions were answered to their satisfaction.  Jesika Men Moshe Salisbury, NP   I, Keyshia Orwick Moshe Salisbury, NP, have reviewed all documentation for this visit. The documentation on 12/17/22 for the exam, diagnosis, procedures, and orders are all accurate and complete.   IF YOU HAVE BEEN REFERRED TO A SPECIALIST, IT MAY TAKE 1-2 WEEKS TO SCHEDULE/PROCESS THE REFERRAL. IF YOU HAVE NOT HEARD FROM US/SPECIALIST IN TWO WEEKS, PLEASE GIVE [place] A CALL AT [phone].   THE PATIENT IS ENCOURAGED TO PRACTICE SOCIAL DISTANCING DUE TO THE COVID-19 PANDEMIC.

## 2022-12-13 ENCOUNTER — Other Ambulatory Visit: Payer: Self-pay | Admitting: Nurse Practitioner

## 2022-12-13 MED ORDER — BUDESONIDE-FORMOTEROL FUMARATE 160-4.5 MCG/ACT IN AERO: 2.0000 | INHALATION_SPRAY | RESPIRATORY_TRACT | 2 refills | Status: DC

## 2022-12-13 NOTE — Telephone Encounter (Signed)
error 

## 2022-12-17 ENCOUNTER — Telehealth: Payer: Self-pay

## 2022-12-17 NOTE — Progress Notes (Signed)
12-17-2022: Scheduled patient with Cherylin Mylar  in June.  Huey Romans [facility] Clinical Pharmacist Assistant [phone]

## 2022-12-19 ENCOUNTER — Other Ambulatory Visit: Payer: Self-pay

## 2022-12-19 MED ORDER — BUDESONIDE-FORMOTEROL FUMARATE 160-4.5 MCG/ACT IN AERO: INHALATION_SPRAY | RESPIRATORY_TRACT | 2 refills | Status: DC

## 2022-12-27 ENCOUNTER — Other Ambulatory Visit: Payer: Self-pay

## 2022-12-27 DIAGNOSIS — K219 Gastro-esophageal reflux disease without esophagitis: Secondary | ICD-10-CM

## 2022-12-27 DIAGNOSIS — I10 Essential (primary) hypertension: Secondary | ICD-10-CM

## 2022-12-27 MED ORDER — POLYETHYLENE GLYCOL 3350 17 GM/SCOOP PO POWD: ORAL | 1 refills | Status: DC

## 2022-12-27 MED ORDER — MECLIZINE HCL 12.5 MG PO TABS: ORAL_TABLET | ORAL | 11 refills | Status: AC

## 2022-12-27 MED ORDER — ALBUTEROL SULFATE HFA 108 (90 BASE) MCG/ACT IN AERS: INHALATION_SPRAY | RESPIRATORY_TRACT | 0 refills | Status: DC

## 2022-12-27 MED ORDER — NORGESTIM-ETH ESTRAD TRIPHASIC 0.18/0.215/0.25 MG-35 MCG PO TABS: 1.0000 | ORAL_TABLET | Freq: Every day | ORAL | 1 refills | Status: AC

## 2022-12-27 MED ORDER — LOSARTAN POTASSIUM 25 MG PO TABS
ORAL_TABLET | ORAL | 11 refills | Status: AC
Start: 2022-12-27 — End: ?

## 2022-12-27 MED ORDER — HYDROCHLOROTHIAZIDE 12.5 MG PO TABS: ORAL_TABLET | ORAL | 11 refills | Status: AC

## 2022-12-27 MED ORDER — ATENOLOL 100 MG PO TABS
ORAL_TABLET | ORAL | 11 refills | Status: AC
Start: 2022-12-27 — End: ?

## 2022-12-27 MED ORDER — JANUMET 50-1000 MG PO TABS: 1.0000 | ORAL_TABLET | Freq: Every day | ORAL | 1 refills | Status: AC

## 2022-12-27 MED ORDER — DEXLANSOPRAZOLE 60 MG PO CPDR
60.0000 mg | DELAYED_RELEASE_CAPSULE | Freq: Every day | ORAL | 1 refills | Status: DC
Start: 2022-12-27 — End: 2023-08-18

## 2022-12-27 MED ORDER — ATORVASTATIN CALCIUM 10 MG PO TABS: ORAL_TABLET | ORAL | 11 refills | Status: AC

## 2023-01-13 ENCOUNTER — Other Ambulatory Visit: Payer: Self-pay

## 2023-01-28 ENCOUNTER — Ambulatory Visit: Payer: Self-pay

## 2023-01-28 NOTE — Patient Instructions (Signed)
Visit Information  Thank you for taking time to visit with me today. Please don't hesitate to contact me if I can be of assistance to you.   Following are the goals we discussed today:   Goals Addressed               This Visit's Progress     Patient Stated     "I need medication for nausea and vomiting" (pt-stated)        Care Coordination Interventions: Evaluation of current treatment plan related to H. Pylori  and patient's adherence to plan as established by provider Reviewed and discussed patient's persistent symptoms of nausea/vomiting and weight loss  Review of patient status, including review of consultant's reports, relevant laboratory and other test results, and medications completed Reviewed patient's upcoming scheduled follow up with [facility], Dr. Tomasa Rand set for 02/24/23 @3 :00 PM         I had another fall (pt-stated)        Care Coordination Interventions: Provided written and verbal education re: potential causes of falls and Fall prevention strategies Advised patient of importance of notifying provider of falls Assessed for falls since last encounter Assessed patients knowledge of fall risk prevention secondary to previously provided education Screening for signs and symptoms of 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  Determined patient experienced a recent fall resulting in an ED visit, it was determined her intrathecal pain pump was administering a higher dose of narcotics than needed due to patient having lost more than 60 lbs since having the pump installed Determined patient completed a visit with her Pain management doctor who adjusted her dosage           Our next appointment is by telephone on 02/26/23 at 1:00 PM  Please call the care guide team at [phone] if you need to cancel or reschedule your appointment.   If you are experiencing a Mental Health or Behavioral Health Crisis or need someone to talk to, please call 1-800-273-TALK (toll  free, 24 hour hotline)  Patient verbalizes understanding of instructions and care plan provided today and agrees to view in MyChart. Active MyChart status and patient understanding of how to access instructions and care plan via MyChart confirmed with patient.     Delsa Sale, RN, BSN, CCM Care Management Coordinator [facility] Care Management Direct Phone: [phone]

## 2023-01-28 NOTE — Patient Outreach (Signed)
  Care Coordination   Follow Up Visit Note   01/28/2023 Name: Linda Mercado MRN: [id number] DOB: 28-Jan-1966  Linda Mercado is a 57 y.o. year old female who sees [name], FNP for primary care. I spoke with  [name] by phone today.  What matters to the patients health and wellness today?  Patient would like to avoid from having additional falls. She would like to f/u with GI for evaluation of her GI symptoms and weight loss.     Goals Addressed               This Visit's Progress     Patient Stated     "I need medication for nausea and vomiting" (pt-stated)        Care Coordination Interventions: Evaluation of current treatment plan related to H. Pylori  and patient's adherence to plan as established by provider Reviewed and discussed patient's persistent symptoms of nausea/vomiting and weight loss  Review of patient status, including review of consultant's reports, relevant laboratory and other test results, and medications completed Reviewed patient's upcoming scheduled follow up with [facility], Dr. Tomasa Rand set for 02/24/23 @3 :00 PM         I had another fall (pt-stated)        Care Coordination Interventions: Provided written and verbal education re: potential causes of falls and Fall prevention strategies Advised patient of importance of notifying provider of falls Assessed for falls since last encounter Assessed patients knowledge of fall risk prevention secondary to previously provided education Screening for signs and symptoms of 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  Determined patient experienced a recent fall resulting in an ED visit, it was determined her intrathecal pain pump was administering a higher dose of narcotics than needed due to patient having lost more than 60 lbs since having the pump installed Determined patient completed a visit with her Pain management doctor who adjusted her dosage       Interventions Today    Flowsheet Row  Most Recent Value  Chronic Disease   Chronic disease during today's visit Other  [vomiting,  s/p falls]  General Interventions   General Interventions Discussed/Reviewed General Interventions Discussed, General Interventions Reviewed, Doctor Visits  Doctor Visits Discussed/Reviewed Doctor Visits Discussed, Doctor Visits Reviewed, Specialist, PCP  Education Interventions   Education Provided Provided Education  Safety Interventions   Safety Discussed/Reviewed Safety Discussed, Safety Reviewed, Fall Risk, Home Safety  Home Safety Assistive Devices          SDOH assessments and interventions completed:  No     Care Coordination Interventions:  Yes, provided   Follow up plan: Follow up call scheduled for 02/26/23 @1 :00 PM    Encounter Outcome:  Pt. Visit Completed

## 2023-02-19 ENCOUNTER — Encounter: Payer: 59 | Admitting: Nurse Practitioner

## 2023-02-19 NOTE — Progress Notes (Deleted)
Linda Mercado, CMA,acting as a Neurosurgeon for Linda Felts, FNP.,have documented all relevant documentation on the behalf of Linda Felts, FNP,as directed by  Linda Felts, FNP while in the presence of Linda Felts, FNP.  Subjective:    Patient ID: Linda Mercado , female    DOB: 1966-05-20 , 57 y.o.   MRN: [id number]  No chief complaint on file.   HPI  Patient presents today for HM. Patient reports compliance with medication. Patient denies any chest pain, SOB, and headaches. Patient has no concerns today. Patient previously EKG.     Past Medical History:  Diagnosis Date  . Acid reflux   . Bradycardia   . Chronic back pain   . Diabetes mellitus without complication (HCC)    "prediabetes", A1C down to 5 - reported on 10/11/21  . Hypertension   . Hypokalemia      Family History  Problem Relation Age of Onset  . Kidney disease Mother   . Hypertension Mother   . Diabetes Father   . Hypertension Father   . Bipolar disorder Sister   . Schizophrenia Sister   . Neuropathy Sister   . Hypertension Sister   . Bipolar disorder Daughter   . Post-traumatic stress disorder Daughter   . Asthma Daughter   . Depression Daughter   . Bipolar disorder Daughter   . Post-traumatic stress disorder Daughter   . Asthma Daughter   . Depression Daughter   . Asthma Daughter   . Depression Daughter   . Asthma Daughter   . Breast cancer Paternal Grandmother   . Bipolar disorder Brother   . Bipolar disorder Brother   . Schizophrenia Brother   . Stroke Neg Hx   . Cancer Neg Hx   . CAD Neg Hx      Current Outpatient Medications:  .  albuterol (VENTOLIN HFA) 108 (90 Base) MCG/ACT inhaler, INHALE TWO PUFFS BY MOUTH EVERY 6 HOURS AS NEEDED FOR WHEEZING or shortness of breath, Disp: 20.1 g, Rfl: 0 .  Alcohol Swabs PADS, Use as directed to check blood sugars, Disp: 100 each, Rfl: 2 .  atenolol (TENORMIN) 100 MG tablet, Take one tablet by mouth daily., Disp: 30 tablet, Rfl: 11 .  atorvastatin  (LIPITOR) 10 MG tablet, Take one tablet by mouth daily at bedtime., Disp: 90 tablet, Rfl: 11 .  Blood Glucose Monitoring Suppl (ONETOUCH VERIO) w/Device KIT, Use as directed to check 1 time per day dx: e11.65, Disp: 1 kit, Rfl: 1 .  budesonide-formoterol (SYMBICORT) 160-4.5 MCG/ACT inhaler, Two puff into lungs 2 times daily., Disp: 1 each, Rfl: 2 .  calcium gluconate 500 MG tablet, Take 1 tablet (500 mg total) by mouth 3 (three) times daily., Disp: 270 tablet, Rfl: 1 .  CAPLYTA 42 MG CAPS, Take 42 mg by mouth at bedtime. , Disp: , Rfl:  .  Carbamazepine (EQUETRO) 300 MG CP12, Take by mouth., Disp: , Rfl:  .  clonazePAM (KLONOPIN) 1 MG tablet, Take 1 mg by mouth 3 (three) times daily., Disp: , Rfl:  .  cyclobenzaprine (FLEXERIL) 10 MG tablet, Take 1 tablet (10 mg total) by mouth 2 (two) times daily as needed for muscle spasms., Disp: 20 tablet, Rfl: 0 .  desvenlafaxine (PRISTIQ) 100 MG 24 hr tablet, Take 100 mg by mouth every morning., Disp: , Rfl:  .  dexlansoprazole (DEXILANT) 60 MG capsule, Take 1 capsule (60 mg total) by mouth daily. Patient will need appt for further refills, Disp: 90 capsule, Rfl: 1 .  diclofenac Sodium (VOLTAREN) 1 % GEL, APPLY 4 GRAMS TO THE AFFECTED AREA FOUR TIMES DAILY, Disp: 400 g, Rfl: 1 .  docusate sodium (COLACE) 50 MG capsule, Take 50 mg by mouth daily. , Disp: , Rfl:  .  fluconazole (DIFLUCAN) 100 MG tablet, Take 1 tablet (100 mg total) by mouth daily. Take 1 tablet by mouth now repeat in 5 days, Disp: 2 tablet, Rfl: 0 .  Glucagon (GVOKE HYPOPEN 2-PACK) 0.5 MG/0.1ML SOAJ, Inject 1 each into the skin as needed., Disp: 0.2 mL, Rfl: 2 .  GNP ULTICARE PEN NEEDLES 32G X 4 MM MISC, USE AS DIRECTED WITH TRESIBA DAILY (BULK), Disp: 100 each, Rfl: 1 .  hydrochlorothiazide (HYDRODIURIL) 12.5 MG tablet, Take one tablet by mouth daily in the morning., Disp: 90 tablet, Rfl: 11 .  HYDROmorphone (DILAUDID) 4 MG tablet, Take 4 mg by mouth 4 (four) times daily as needed for severe  pain., Disp: , Rfl:  .  hydrOXYzine (ATARAX/VISTARIL) 25 MG tablet, Take 50 mg by mouth in the morning, at noon, in the evening, and at bedtime., Disp: , Rfl:  .  lidocaine (XYLOCAINE) 2 % solution, Use as directed 15 mLs in the mouth or throat 3 (three) times a week. , Disp: , Rfl:  .  Lidocaine 4 % PTCH, Place 1 patch onto the skin in the morning and at bedtime., Disp: 60 patch, Rfl: 5 .  LORazepam (ATIVAN) 2 MG tablet, Take 2 mg by mouth 4 (four) times daily as needed for anxiety. , Disp: , Rfl:  .  losartan (COZAAR) 25 MG tablet, TAKE ONE TABLET BY MOUTH DAILY AT 9AM, Disp: 30 tablet, Rfl: 11 .  lubiprostone (AMITIZA) 24 MCG capsule, Take by mouth., Disp: , Rfl:  .  meclizine (ANTIVERT) 12.5 MG tablet, TAKE ONE TABLET BY MOUTH THREE TIMES DAILY AS NEEDED FOR DIZZINESS (VIAL), Disp: 90 tablet, Rfl: 11 .  meloxicam (MOBIC) 15 MG tablet, Take 15 mg by mouth daily., Disp: , Rfl:  .  Multiple Vitamin (MULTIVITAMIN WITH MINERALS) TABS tablet, Take 1 tablet by mouth daily. , Disp: , Rfl:  .  NARCAN 4 MG/0.1ML LIQD nasal spray kit, Place 1 spray into the nose as directed., Disp: , Rfl: 0 .  Norgestimate-Ethinyl Estradiol Triphasic (TRI-ESTARYLLA) 0.18/0.215/0.25 MG-35 MCG tablet, Take 1 tablet by mouth daily., Disp: 84 tablet, Rfl: 1 .  ondansetron (ZOFRAN-ODT) 8 MG disintegrating tablet, Take 1 tablet (8 mg total) by mouth every 8 (eight) hours as needed for nausea or vomiting. 8mg  ODT q4 hours prn nausea, Disp: 20 tablet, Rfl: 0 .  OneTouch Delica Lancets 33G MISC, Use as directed to check 1 time per day dx: e11.65, Disp: 100 each, Rfl: 3 .  ONETOUCH VERIO test strip, Use as directed to check 1 time per day dx: e11.65, Disp: 100 each, Rfl: 3 .  PAIN MANAGEMENT INTRATHECAL, IT, PUMP, 1 each by Intrathecal route continuous. Intrathecal (IT) medication: fentanyl, clonidine, bupivicaine  Fentanyl 250mg /bupivicaine150mg /clonidine2mg   51.97mg  per day, 2mg  per day, 1.49mcg 15.43ml/qmin, Disp: , Rfl:  .   polyethylene glycol powder (GLYCOLAX/MIRALAX) 17 GM/SCOOP powder, MIX 17 GRAMS IN 8 OUNCES OF LIQUID AND DRINK DAILY FOR BOWELS, Disp: 238 g, Rfl: 1 .  QUEtiapine (SEROQUEL XR) 400 MG 24 hr tablet, Take 400 mg by mouth 2 (two) times daily., Disp: , Rfl:  .  QUEtiapine (SEROQUEL) 300 MG tablet, Take 300 mg by mouth at bedtime., Disp: , Rfl:  .  sitaGLIPtin-metformin (JANUMET) 50-1000 MG tablet, Take 1 tablet by  mouth daily with breakfast., Disp: 90 tablet, Rfl: 1 .  sucralfate (CARAFATE) 1 GM/10ML suspension, TAKE 10 ML BY MOUTH TWICE DAILY (BULK), Disp: 420 mL, Rfl: 1 .  tiZANidine (ZANAFLEX) 4 MG tablet, Take 4 mg by mouth every 4 (four) hours., Disp: , Rfl:  .  traZODone (DESYREL) 100 MG tablet, 5 qhs prescribed by psychiatry (Patient taking differently: Take 100-200 mg by mouth at bedtime. 5 qhs prescribed by psychiatry), Disp: 30 tablet, Rfl: 0 .  triamcinolone cream (KENALOG) 0.1 %, Apply 1 application topically daily as needed (for itching)., Disp: 30 g, Rfl: 2 .  TRINTELLIX 20 MG TABS tablet, Take 20 mg by mouth daily., Disp: , Rfl:    Allergies  Allergen Reactions  . Lisinopril Hives    Other reaction(s): anaphylaxis/angioedema  . Gabapentin     Other Reaction(s): Nausea and Vomiting, Rash, Nausea and Vomiting, Rash  . Latex Hives  . Morphine And Codeine     Itching, skin get hot.   . Adhesive [Tape] Rash      The patient states she uses {contraceptive methods:5051} for birth control. No LMP recorded.. {Dysmenorrhea-menorrhagia:21918}. Negative for: breast discharge, breast lump(s), breast pain and breast self exam. Associated symptoms include abnormal vaginal bleeding. Pertinent negatives include abnormal bleeding (hematology), anxiety, decreased libido, depression, difficulty falling sleep, dyspareunia, history of infertility, nocturia, sexual dysfunction, sleep disturbances, urinary incontinence, urinary urgency, vaginal discharge and vaginal itching. Diet regular.The patient states  her exercise level is    . The patient's tobacco use is:  Social History   Tobacco Use  Smoking Status Never  Smokeless Tobacco Never  . She has been exposed to passive smoke. The patient's alcohol use is:  Social History   Substance and Sexual Activity  Alcohol Use Not Currently  . Additional information: Last pap ***, next one scheduled for ***.    Review of Systems   There were no vitals filed for this visit. There is no height or weight on file to calculate BMI.  Wt Readings from Last 3 Encounters:  12/11/22 117 lb (53.1 kg)  12/03/22 118 lb (53.5 kg)  10/13/22 122 lb (55.3 kg)     Objective:  Physical Exam      Assessment And Plan:     Encounter for annual health examination  Type 2 diabetes mellitus with diabetic mononeuropathy, without long-term current use of insulin (HCC)  Schizoaffective disorder, bipolar type (HCC)  Primary hypertension     No follow-ups on file. Patient was given opportunity to ask questions. Patient verbalized understanding of the plan and was able to repeat key elements of the plan. All questions were answered to their satisfaction.   Linda Felts, FNP  I, Linda Felts, FNP, have reviewed all documentation for this visit. The documentation on 02/19/23 for the exam, diagnosis, procedures, and orders are all accurate and complete.

## 2023-02-21 ENCOUNTER — Other Ambulatory Visit: Payer: Self-pay | Admitting: Nurse Practitioner

## 2023-02-24 ENCOUNTER — Ambulatory Visit: Payer: 59 | Admitting: Gastroenterology

## 2023-02-24 ENCOUNTER — Encounter: Payer: Self-pay | Admitting: Nurse Practitioner

## 2023-02-26 ENCOUNTER — Ambulatory Visit: Payer: Self-pay

## 2023-02-26 NOTE — Patient Outreach (Signed)
  Care Coordination   Follow Up Visit Note   02/26/2023 Name: Linda Mercado MRN: [id number] DOB: 1966-02-11  Linda Mercado is a 57 y.o. year old female who sees [name], FNP for primary care. I spoke with  Linda Mercado by phone today.  What matters to the patients health and wellness today?  Patient is concerned about her neck pain and numbness with neck movement. She would like to reschedule her PCP follow up appointment.     Goals Addressed               This Visit's Progress     Patient Stated     "I need medication for nausea and vomiting" (pt-stated)   Not on track     Care Coordination Interventions: Evaluation of current treatment plan related to H. Pylori  and patient's adherence to plan as established by provider Determined patient rescheduled her GI appointment due to having increased pain and a fall on the day she was scheduled Reviewed and discussed rescheduled follow up with Dr. Tomasa Rand set for 05/21/23 @10 :10 AM, she is also on the cancellation list  Assessed for abnormal weight loss since last nurse contact, patient reports losing 2 lbs      I had another fall (pt-stated)   Not on track     Care Coordination Interventions: Advised patient of importance of notifying provider of falls Assessed for falls since last encounter Assessed patients knowledge of fall risk prevention secondary to previously provided education      Other     "I would like for my neck to feel better"   Not on track     Care Coordination Interventions: Reviewed provider established plan for pain management Reviewed and discussed with patient she underwent a recent procedure for her neck pain, this was ineffective for her pain Discussed patient placed a call to her pain management MD this am to advise her neck pain has not subsided and patient is experiencing intermittent numbness to certain body parts with neck movement  Counseled on the importance of reporting any/all new or  changed pain symptoms or management strategies to pain management provider    Interventions Today    Flowsheet Row Most Recent Value  Chronic Disease   Chronic disease during today's visit Other  [chronic pain, nausea/vomiting,  falls]  General Interventions   General Interventions Discussed/Reviewed General Interventions Discussed, General Interventions Reviewed, Labs, Doctor Visits  Doctor Visits Discussed/Reviewed PCP, Doctor Visits Reviewed, Doctor Visits Discussed, Specialist  Exercise Interventions   Exercise Discussed/Reviewed Physical Activity  Physical Activity Discussed/Reviewed Physical Activity Discussed  Education Interventions   Education Provided Provided Education  Provided Verbal Education On When to see the doctor  Pharmacy Interventions   Pharmacy Dicussed/Reviewed Pharmacy Topics Reviewed  Safety Interventions   Safety Discussed/Reviewed Safety Discussed, Safety Reviewed, Fall Risk, Home Safety  Home Safety Assistive Devices          SDOH assessments and interventions completed:  No     Care Coordination Interventions:  Yes, provided   Follow up plan: Follow up call scheduled for 05/23/23 @1 :00 PM    Encounter Outcome:  Pt. Visit Completed

## 2023-02-26 NOTE — Patient Instructions (Signed)
Visit Information  Thank you for taking time to visit with me today. Please don't hesitate to contact me if I can be of assistance to you.   Following are the goals we discussed today:   Goals Addressed               This Visit's Progress     Patient Stated     "I need medication for nausea and vomiting" (pt-stated)   Not on track     Care Coordination Interventions: Evaluation of current treatment plan related to H. Pylori  and patient's adherence to plan as established by provider Determined patient rescheduled her GI appointment due to having increased pain and a fall on the day she was scheduled Reviewed and discussed rescheduled follow up with Dr. Tomasa Rand set for 05/21/23 @10 :10 AM, she is also on the cancellation list  Assessed for abnormal weight loss since last nurse contact, patient reports losing 2 lbs      I had another fall (pt-stated)   Not on track     Care Coordination Interventions: Advised patient of importance of notifying provider of falls Assessed for falls since last encounter Assessed patients knowledge of fall risk prevention secondary to previously provided education      Other     "I would like for my neck to feel better"   Not on track     Care Coordination Interventions: Reviewed provider established plan for pain management Reviewed and discussed with patient she underwent a recent procedure for her neck pain, this was ineffective for her pain Discussed patient placed a call to her pain management MD this am to advise her neck pain has not subsided and patient is experiencing intermittent numbness to certain body parts with neck movement  Counseled on the importance of reporting any/all new or changed pain symptoms or management strategies to pain management provider        Our next appointment is by telephone on 05/23/23 at 1:00 PM  Please call the care guide team at [phone] if you need to cancel or reschedule your appointment.   If you  are experiencing a Mental Health or Behavioral Health Crisis or need someone to talk to, please call 1-800-273-TALK (toll free, 24 hour hotline)  Patient verbalizes understanding of instructions and care plan provided today and agrees to view in MyChart. Active MyChart status and patient understanding of how to access instructions and care plan via MyChart confirmed with patient.     Delsa Sale, RN, BSN, CCM Care Management Coordinator [facility] Care Management  Direct Phone: [phone]

## 2023-03-13 ENCOUNTER — Telehealth: Payer: Self-pay

## 2023-03-13 NOTE — Telephone Encounter (Signed)
Patient was called to see if she could come in for a visit since a letter was sent about a high BP(170/100)- patient declined any appointments patient was encouraged to go to urgent care patient stated "I am not going to urgent care my blood pressure is fine".

## 2023-03-20 ENCOUNTER — Ambulatory Visit (HOSPITAL_COMMUNITY)
Admission: EM | Admit: 2023-03-20 | Discharge: 2023-03-20 | Disposition: A | Discharge status: Home / Self Care | Payer: 59 | Attending: Family Medicine | Admitting: Family Medicine

## 2023-03-20 ENCOUNTER — Encounter (HOSPITAL_COMMUNITY): Payer: Self-pay

## 2023-03-20 DIAGNOSIS — N898 Other specified noninflammatory disorders of vagina: Secondary | ICD-10-CM | POA: Diagnosis not present

## 2023-03-20 DIAGNOSIS — R109 Unspecified abdominal pain: Secondary | ICD-10-CM | POA: Diagnosis not present

## 2023-03-20 DIAGNOSIS — R8290 Unspecified abnormal findings in urine: Secondary | ICD-10-CM | POA: Insufficient documentation

## 2023-03-20 LAB — POCT URINALYSIS DIP (MANUAL ENTRY)
Bilirubin, UA: NEGATIVE
Blood, UA: NEGATIVE
Glucose, UA: NEGATIVE mg/dL
Nitrite, UA: NEGATIVE
Spec Grav, UA: 1.025 (ref 1.010–1.025)
Urobilinogen, UA: 0.2 E.U./dL
pH, UA: 6.5 (ref 5.0–8.0)

## 2023-03-20 LAB — POCT URINE PREGNANCY: Preg Test, Ur: NEGATIVE

## 2023-03-20 MED ORDER — FLUCONAZOLE 150 MG PO TABS: 150.0000 mg | ORAL_TABLET | Freq: Once | ORAL | 0 refills | Status: DC | PRN

## 2023-03-20 NOTE — ED Provider Notes (Signed)
[facility]    CSN: [id number] Arrival date & time: 03/20/23  1850      History   Chief Complaint Chief Complaint  Patient presents with   Abdominal Pain   Vaginal Discharge    HPI Linda Mercado is a 57 y.o. female.  3-day history of left low back pain. Comes and goes. Feels with movement. Denies urinary symptoms.  No dysuria, hematuria, urgency or frequency.  No fevers  She is having vaginal discharge with slight odor.  Reports using some soap inside the vagina last week.  She is sexually active but no known exposures to STDs; monogamous relationship   LMP 8/16  Past Medical History:  Diagnosis Date   Acid reflux    Bradycardia    Chronic back pain    Diabetes mellitus without complication (HCC)    "prediabetes", A1C down to 5 - reported on 10/11/21   Hypertension    Hypokalemia     Patient Active Problem List   Diagnosis Date Noted   Fall 12/11/2022   Rib pain on left side 12/11/2022   Treatment declined by patient 12/11/2022   Polypharmacy 12/11/2022   Adjustment disorder with depressed mood 09/13/2022   Asthma 09/13/2022   Carpal tunnel syndrome 09/13/2022   Cervical radiculopathy 09/13/2022   Depression with anxiety 09/13/2022   Dermatophytosis 09/13/2022   Disturbance of skin sensation 09/13/2022   Dysphonia 09/13/2022   Encounter for sterilization 09/13/2022   Impaired fasting glucose 09/13/2022   Insomnia 09/13/2022   Laryngopharyngeal reflux 09/13/2022   Lumbar spondylosis 09/13/2022   Lumbar sprain 09/13/2022   Neck pain 09/13/2022   Nontoxic uninodular goiter 09/13/2022   Pneumonia 09/13/2022   Thyroid cyst 09/13/2022   Mixed hyperlipidemia 09/19/2021   Generalized abdominal pain 09/19/2021   Pain in right knee 11/17/2019   Pain in left knee 05/06/2019   Hashimoto's disease 04/05/2019   Nausea and vomiting 11/23/2018   Frequent falls 09/17/2018   Skin lesion 09/17/2018   GERD (gastroesophageal reflux disease)    Chronic  midline low back pain with sciatica 05/27/2018   Intractable pain 03/20/2018   Bradycardia    Chronic pain syndrome 03/18/2018   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   Hypokalemia 03/18/2018   Intractable back pain 01/03/2018   HTN (hypertension) 01/03/2018   Vitamin D deficiency 07/11/2017   Postmenopausal 04/24/2017   Microalbuminuria 01/14/2017   Dyspareunia in female 12/10/2016   Endometriosis determined by laparoscopy 12/10/2016   History of exploratory laparotomy 12/10/2016   Pelvic pain 12/10/2016   Bipolar 1 disorder (HCC) 09/19/2016   Cyst of right ovary 09/19/2016   Severe episode of recurrent major depressive disorder, 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      Home Medications    Prior to Admission medications   Medication Sig Start Date End Date Taking? Authorizing Provider  albuterol (VENTOLIN HFA) 108 (90 Base)  MCG/ACT inhaler INHALE 2 PUFFS BY MOUTH EVERY 6 HOURS AS NEEDED FOR SHORTNESS OF BREATH AND WHEEZING 02/24/23  Yes [name], FNP  Alcohol Swabs PADS Use as directed to check blood sugars 01/30/21  Yes [name], FNP  atenolol (TENORMIN) 100 MG tablet Take one tablet by mouth daily. 12/27/22  Yes [name], FNP  atorvastatin (LIPITOR) 10 MG tablet Take one tablet by mouth daily at bedtime. 12/27/22  Yes [name], FNP  Blood Glucose Monitoring Suppl (ONETOUCH VERIO) w/Device KIT Use as directed to check 1 time per day  dx: e11.65 02/07/21  Yes [name], FNP  budesonide-formoterol [facility]) 160-4.5 MCG/ACT inhaler Two puff into lungs 2 times daily. 12/19/22  Yes [name], FNP  calcium gluconate 500 MG tablet Take 1 tablet (500 mg total) by mouth 3 (three) times daily. 01/30/21  Yes [name], FNP  clonazePAM (KLONOPIN) 1 MG tablet Take 1 mg by mouth 3 (three) times daily.   Yes [provider]  desvenlafaxine (PRISTIQ) 100 MG 24 hr tablet Take 100 mg by mouth every morning. 08/07/22  Yes [provider]  dexlansoprazole (DEXILANT) 60 MG capsule Take 1 capsule (60 mg total) by mouth daily. Patient will need appt for further refills 12/27/22  Yes [name], FNP  diclofenac Sodium (VOLTAREN) 1 % GEL APPLY 4 GRAMS TO THE AFFECTED AREA FOUR TIMES DAILY 05/22/20  Yes [name], FNP  docusate sodium (COLACE) 50 MG capsule Take 50 mg by mouth daily.    Yes [provider]  fluconazole (DIFLUCAN) 150 MG tablet Take 1 tablet (150 mg total) by mouth once as needed for up to 2 doses (take one pill on day 1, and the second pill 3 days later). 03/20/23  Yes Lam Mccubbins, Lurena Joiner, PA-C  hydrochlorothiazide (HYDRODIURIL) 12.5 MG tablet Take one tablet by mouth daily in the morning. 12/27/22  Yes [name], FNP  HYDROmorphone (DILAUDID) 4 MG tablet Take 4 mg by mouth 4 (four) times daily as needed for severe pain.   Yes [provider]  hydrOXYzine (ATARAX/VISTARIL) 25 MG tablet Take 50 mg by mouth in the morning, at noon, in the evening, and at bedtime.   Yes [provider]  lidocaine (XYLOCAINE) 2 % solution Use as directed 15 mLs in the mouth or throat 3 (three) times a week.  04/19/19  Yes [provider]  Lidocaine 4 % PTCH Place 1 patch onto the skin in the morning and at bedtime. 05/22/20  Yes [name], FNP  LORazepam (ATIVAN) 2 MG tablet Take 2 mg by mouth 4 (four) times daily as needed for anxiety.    Yes [provider]  losartan (COZAAR) 25 MG  tablet TAKE ONE TABLET BY MOUTH DAILY AT 9AM 12/27/22  Yes [name], FNP  meclizine (ANTIVERT) 12.5 MG tablet TAKE ONE TABLET BY MOUTH THREE TIMES DAILY AS NEEDED FOR DIZZINESS (VIAL) 12/27/22  Yes [name], FNP  meloxicam (MOBIC) 15 MG tablet Take 15 mg by mouth daily. 08/08/21  Yes [provider]  Multiple Vitamin (MULTIVITAMIN WITH MINERALS) TABS tablet Take 1 tablet by mouth daily.    Yes [provider]  NARCAN 4 MG/0.1ML LIQD nasal spray kit Place 1 spray into the nose as directed. 01/19/18  Yes [provider]  Norgestimate-Ethinyl Estradiol Triphasic (TRI-ESTARYLLA) 0.18/0.215/0.25 MG-35 MCG tablet Take 1 tablet by mouth daily. 12/27/22  Yes [name], FNP  ondansetron (ZOFRAN-ODT) 8 MG disintegrating tablet Take 1 tablet (8 mg total) by mouth every 8 (  eight) hours as needed for nausea or vomiting. 8mg  ODT q4 hours prn nausea 02/19/22  Yes [name], FNP  OneTouch Delica Lancets 33G MISC Use as directed to check 1 time per day dx: e11.65 02/07/21  Yes [name], FNP  [facility] VERIO test strip Use as directed to check 1 time per day dx: e11.65 02/07/21  Yes [name], FNP  PAIN MANAGEMENT INTRATHECAL, IT, PUMP 1 each by Intrathecal route continuous. Intrathecal (IT) medication: fentanyl, clonidine, bupivicaine  Fentanyl 250mg /bupivicaine150mg /clonidine2mg   51.97mg  per day, 2mg  per day, 1.71mcg 15.43ml/qmin   Yes [provider]  polyethylene glycol powder (GLYCOLAX/MIRALAX) 17 GM/SCOOP powder MIX 17 GRAMS IN 8 OUNCES OF LIQUID AND DRINK DAILY FOR BOWELS 12/27/22  Yes [name], FNP  QUEtiapine (SEROQUEL XR) 400 MG 24 hr tablet Take 400 mg by mouth 2 (two) times daily.   Yes [provider]  QUEtiapine (SEROQUEL) 300 MG tablet Take 300 mg by mouth at bedtime.   Yes [provider]  sitaGLIPtin-metformin (JANUMET) 50-1000 MG tablet Take 1 tablet by mouth daily with breakfast. 12/27/22  Yes [name], FNP  tiZANidine  (ZANAFLEX) 4 MG tablet Take 4 mg by mouth every 4 (four) hours. 08/18/19  Yes [provider]  triamcinolone cream (KENALOG) 0.1 % Apply 1 application topically daily as needed (for itching). 03/08/19  Yes Rodriguez-Southworth, Nettie Elm, PA-C  TRINTELLIX 20 MG TABS tablet Take 20 mg by mouth daily. 02/22/20  Yes [provider]  CAPLYTA 42 MG CAPS Take 42 mg by mouth at bedtime.  03/19/19   [provider]  Carbamazepine (EQUETRO) 300 MG CP12 Take by mouth. 12/20/20   [provider]  cyclobenzaprine (FLEXERIL) 10 MG tablet Take 1 tablet (10 mg total) by mouth 2 (two) times daily as needed for muscle spasms. 08/27/22   Carroll Sage, PA-C  Glucagon (GVOKE HYPOPEN 2-PACK) 0.5 MG/0.1ML SOAJ Inject 1 each into the skin as needed. 12/31/21   [name], FNP  GNP ULTICARE PEN NEEDLES 32G X 4 MM MISC USE AS DIRECTED WITH TRESIBA DAILY (BULK) 11/08/22   [name], FNP  lubiprostone (AMITIZA) 24 MCG capsule Take by mouth.    [provider]  sucralfate (CARAFATE) 1 GM/10ML suspension TAKE 10 ML BY MOUTH TWICE DAILY (BULK) 01/24/22   [name], FNP  traZODone (DESYREL) 100 MG tablet 5 qhs prescribed by psychiatry Patient taking differently: Take 100-200 mg by mouth at bedtime. 5 qhs prescribed by psychiatry 02/18/19   Rodriguez-Southworth, Nettie Elm, PA-C    Family History Family History  Problem Relation Age of Onset   Kidney disease Mother    Hypertension Mother    Diabetes Father    Hypertension Father    Bipolar disorder Sister    Schizophrenia Sister    Neuropathy Sister    Hypertension Sister    Bipolar disorder Daughter    Post-traumatic stress disorder Daughter    Asthma Daughter    Depression Daughter    Bipolar disorder Daughter    Post-traumatic stress disorder Daughter    Asthma Daughter    Depression Daughter    Asthma Daughter    Depression Daughter    Asthma Daughter    Breast cancer Paternal Grandmother    Bipolar disorder Brother     Bipolar disorder Brother    Schizophrenia Brother    Stroke Neg Hx    Cancer Neg Hx    CAD Neg Hx     Social History Social History   Tobacco Use  Smoking status: Never   Smokeless tobacco: Never  Vaping Use   Vaping status: Never Used  Substance Use Topics   Alcohol use: Not Currently   Drug use: Never     Allergies   Lisinopril, Gabapentin, Latex, Morphine and codeine, and Adhesive [tape]   Review of Systems Review of Systems As per HPI  Physical Exam Triage Vital Signs ED Triage Vitals  Encounter Vitals Group     BP 03/20/23 1936 (!) 188/73     Systolic BP Percentile --      Diastolic BP Percentile --      Pulse Rate 03/20/23 1936 66     Resp 03/20/23 1936 18     Temp 03/20/23 1936 98.5 F (36.9 C)     Temp Source 03/20/23 1936 Oral     SpO2 03/20/23 1936 99 %     Weight 03/20/23 1936 124 lb 6.4 oz (56.4 kg)     Height 03/20/23 1936 5\' 2"  (1.575 m)     Head Circumference --      Peak Flow --      Pain Score 03/20/23 1933 6     Pain Loc --      Pain Education --      Exclude from Growth Chart --    No data found.  Updated Vital Signs BP (!) 188/73 (BP Location: Left Arm)   Pulse 66   Temp 98.5 F (36.9 C) (Oral)   Resp 18   Ht 5\' 2"  (1.575 m)   Wt 124 lb 6.4 oz (56.4 kg)   LMP 03/07/2023 (Approximate)   SpO2 99%   BMI 22.75 kg/m   Physical Exam Vitals and nursing note reviewed.  Constitutional:      General: She is not in acute distress.    Appearance: Normal appearance.  HENT:     Mouth/Throat:     Pharynx: Oropharynx is clear.  Cardiovascular:     Rate and Rhythm: Normal rate and regular rhythm.     Pulses: Normal pulses.     Heart sounds: Normal heart sounds.  Pulmonary:     Effort: Pulmonary effort is normal.     Breath sounds: Normal breath sounds.  Abdominal:     Palpations: Abdomen is soft.     Tenderness: There is no abdominal tenderness. There is no right CVA tenderness, left CVA tenderness, guarding or rebound.   Musculoskeletal:     Comments: Left low back muscular tenderness. No bony/spinal pain. No CVA tenderness  Skin:    General: Skin is warm and dry.  Neurological:     Mental Status: She is alert and oriented to person, place, and time.    UC Treatments / Results  Labs (all labs ordered are listed, but only abnormal results are displayed) Labs Reviewed  POCT URINALYSIS DIP (MANUAL ENTRY) - Abnormal; Notable for the following components:      Result Value   Clarity, UA cloudy (*)    Ketones, POC UA trace (5) (*)    Protein Ur, POC trace (*)    Leukocytes, UA Small (1+) (*)    All other components within normal limits  URINE CULTURE  POCT URINE PREGNANCY  CERVICOVAGINAL ANCILLARY ONLY    EKG  Radiology No results found.  Procedures Procedures   Medications Ordered in UC Medications - No data to display  Initial Impression / Assessment and Plan / UC Course  I have reviewed the triage vital signs and the nursing notes.  Pertinent labs & imaging  results that were available during my care of the patient were reviewed by me and considered in my medical decision making (see chart for details).  UPT negative UA small leuks. Likely vaginal given the discharge. Will culture but low concern for urinary infection. No CVA tenderness. Back pain muscular. Try ibu or tylenol   Cytology swab is pending. For now treat for yeast with fluconazole.  Return precautions Patient agreeable to plan  Final Clinical Impressions(s) / UC Diagnoses   Final diagnoses:  Vaginal discharge     Discharge Instructions      We will call you if anything on your swab returns positive. You can also see these results on MyChart. Please abstain from sexual intercourse until your results return.  In the meantime I am treating you for yeast.  Please take 1 pill today and the next in 3 days.     ED Prescriptions     Medication Sig Dispense Auth. Provider   fluconazole (DIFLUCAN) 150 MG tablet Take  1 tablet (150 mg total) by mouth once as needed for up to 2 doses (take one pill on day 1, and the second pill 3 days later). 2 tablet Roberta Kelly, Lurena Joiner, PA-C      PDMP not reviewed this encounter.   Levy Wellman, Ray Church 03/20/23 2032

## 2023-03-20 NOTE — Discharge Instructions (Addendum)
We will call you if anything on your swab returns positive. You can also see these results on MyChart. Please abstain from sexual intercourse until your results return.  In the meantime I am treating you for yeast.  Please take 1 pill today and the next in 3 days.

## 2023-03-20 NOTE — ED Triage Notes (Signed)
Abdominal Pain; Vaginal Discharge; and Patient have left flank/back pain onset 3 days ago. Patient states she is sexual active. Patient states the vaginal discharge has a slight odor that started today.  Patient used some soap inside the vaginal area last week.

## 2023-03-21 ENCOUNTER — Telehealth: Payer: Self-pay

## 2023-03-21 LAB — URINE CULTURE: Culture: <10000 — AB

## 2023-03-21 MED ORDER — METRONIDAZOLE 500 MG PO TABS: 500.0000 mg | ORAL_TABLET | Freq: Two times a day (BID) | ORAL | 0 refills | Status: AC

## 2023-03-21 NOTE — Telephone Encounter (Signed)
 Per protocol, pt requires tx with metronidazole. Attempted to reach patient x1. LVM. Rx sent to pharmacy on file.

## 2023-03-27 LAB — CERVICOVAGINAL ANCILLARY ONLY
Bacterial Vaginitis (gardnerella): POSITIVE — AB
Candida Glabrata: NEGATIVE
Candida Vaginitis: NEGATIVE
Chlamydia: NEGATIVE
Comment: NEGATIVE
Comment: NEGATIVE
Comment: NEGATIVE
Comment: NEGATIVE
Comment: NEGATIVE
Comment: NORMAL
Neisseria Gonorrhea: NEGATIVE
Trichomonas: NEGATIVE

## 2023-04-08 HISTORY — PX: NECK SURGERY: SHX720

## 2023-04-10 ENCOUNTER — Ambulatory Visit: Payer: MEDICARE | Admitting: Nurse Practitioner

## 2023-04-28 ENCOUNTER — Ambulatory Visit: Payer: 59 | Admitting: Internal Medicine

## 2023-05-21 ENCOUNTER — Ambulatory Visit (INDEPENDENT_AMBULATORY_CARE_PROVIDER_SITE_OTHER): Payer: 59 | Admitting: Gastroenterology

## 2023-05-21 ENCOUNTER — Encounter: Payer: Self-pay | Admitting: Gastroenterology

## 2023-05-21 VITALS — BP 176/98 | Ht 62.5 in | Wt 124.4 lb

## 2023-05-21 DIAGNOSIS — R112 Nausea with vomiting, unspecified: Secondary | ICD-10-CM

## 2023-05-21 DIAGNOSIS — G894 Chronic pain syndrome: Secondary | ICD-10-CM | POA: Diagnosis not present

## 2023-05-21 DIAGNOSIS — R1310 Dysphagia, unspecified: Secondary | ICD-10-CM

## 2023-05-21 DIAGNOSIS — K219 Gastro-esophageal reflux disease without esophagitis: Secondary | ICD-10-CM

## 2023-05-21 DIAGNOSIS — K5903 Drug induced constipation: Secondary | ICD-10-CM

## 2023-05-21 DIAGNOSIS — T402X5A Adverse effect of other opioids, initial encounter: Secondary | ICD-10-CM

## 2023-05-21 MED ORDER — RELISTOR 150 MG PO TABS: 450.0000 mg | ORAL_TABLET | Freq: Every morning | ORAL | 3 refills | Status: AC

## 2023-05-21 NOTE — Progress Notes (Signed)
Discussed the use of AI scribe software for clinical note transcription with the patient, who gave verbal consent to proceed.  History of Present Illness   Linda Mercado, a patient with a history of chronic pain and gastroesophageal reflux disease (GERD), presents with a long-standing issue of dysphagia, dating back to 2016 or 2017. The patient describes a sensation of food getting stuck in the throat, starting at the level of the Adam's apple and sometimes progressing to the chest. This issue has been managed with frequent esophageal dilations, initially monthly and then weekly, due to recurrent esophageal narrowing.  She was previously followed by Dr. Elnoria Howard, who indicated at his last visit that she was being referred to [facility] for a second opinion (his note indicates at least 12 EGD/dilations were performed).  His notes indicate the presence of a proximal stricture/cervical web which was felt to be the source of the dysphasia.  She states that she never was seen by [facility] and that she thought I would be able to further therapies for her swallowing difficulties.  An esophageal manometry in 2021 was normal.  She recalls undergoing a barium swallow several years ago, but I could not find this study.  The patient also reports chronic vomiting, which has been ongoing for approximately three years. The vomiting episodes can last for weeks, during which the patient is unable to retain any food.  In addition to these symptoms, the patient experiences daily heartburn, which becomes more severe without Dexilant, which she has taken for years.  The patient also reports difficulty with bowel movements, occurring only once every two weeks without the use of laxatives. Despite trials of Amitiza and Linzess, the patient's bowel movements have not improved.  The patient's chronic pain is managed with oxycodone and tizanidine, as well as an intrathecal pump and nausea is managed with Phenergan and Zofran.     CT  Abdomen/Pelvis March 2024 Indication:  Abdominal pain, nausea/vomiting IMPRESSION: No acute process demonstrated in the abdomen or pelvis. No evidence of bowel obstruction or inflammation. Diffusely stool-filled colon may indicate constipation in the appropriate clinical setting.    EGD Aug 2023 (Dr. 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 Elnoria Howard) Indication: Screening Melanosis of the colon Redundant, tortuous colon No polyp Repeat 10 years.    Past Medical History:  Diagnosis Date   Acid reflux    Bradycardia    Chronic back pain    Diabetes mellitus without complication (HCC)    "prediabetes", A1C down to 5 - reported on 10/11/21   Hypertension    Hypokalemia      Past Surgical History:  Procedure Laterality Date   APPENDECTOMY     BACK SURGERY     x6   CESAREAN SECTION     x3   COLONOSCOPY  01/2022   Dr Elnoria Howard. was told 10 year repeat   ESOPHAGEAL MANOMETRY N/A 08/25/2019   Procedure: ESOPHAGEAL MANOMETRY (EM);  Surgeon: Jeani Hawking, MD;  Location: [facility];  Service: Endoscopy;  Laterality: N/A;   ESOPHAGOGASTRODUODENOSCOPY     Dr Elnoria Howard   HERNIA REPAIR     NECK SURGERY  04/08/2023   x1 and about to have another   Nerve Block Procedure     C6   Pump surgery     Family History  Problem Relation Age of  Onset   Kidney disease Mother    Hypertension Mother    Diabetes Father    Hypertension Father    Bipolar disorder Sister    Schizophrenia Sister    Neuropathy Sister    Hypertension Sister    Bipolar disorder Brother    Bipolar disorder Brother    Schizophrenia Brother    Breast cancer Paternal Grandmother    Bipolar disorder Daughter    Post-traumatic stress disorder Daughter    Asthma Daughter    Depression Daughter    Bipolar disorder Daughter     Post-traumatic stress disorder Daughter    Asthma Daughter    Depression Daughter    Asthma Daughter    Depression Daughter    Asthma Daughter    Stroke Neg Hx    Cancer Neg Hx    CAD Neg Hx    Colon cancer Neg Hx    Esophageal cancer Neg Hx    Social History   Tobacco Use   Smoking status: Never   Smokeless tobacco: Never  Vaping Use   Vaping status: Never Used  Substance Use Topics   Alcohol use: Not Currently   Drug use: Never   Current Outpatient Medications  Medication Sig Dispense Refill   albuterol (VENTOLIN HFA) 108 (90 Base) MCG/ACT inhaler INHALE 2 PUFFS BY MOUTH EVERY 6 HOURS AS NEEDED FOR SHORTNESS OF BREATH AND WHEEZING 6.7 g 10   atenolol (TENORMIN) 100 MG tablet Take one tablet by mouth daily. 30 tablet 11   atorvastatin (LIPITOR) 10 MG tablet Take one tablet by mouth daily at bedtime. 90 tablet 11   Blood Glucose Monitoring Suppl (ONETOUCH VERIO) w/Device KIT Use as directed to check 1 time per day dx: e11.65 1 kit 1   calcium gluconate 500 MG tablet Take 1 tablet (500 mg total) by mouth 3 (three) times daily. 270 tablet 1   clonazePAM (KLONOPIN) 1 MG tablet Take 1 mg by mouth 3 (three) times daily.     desvenlafaxine (PRISTIQ) 100 MG 24 hr tablet Take 100 mg by mouth every morning.     dexlansoprazole (DEXILANT) 60 MG capsule Take 1 capsule (60 mg total) by mouth daily. Patient will need appt for further refills 90 capsule 1   diclofenac Sodium (VOLTAREN) 1 % GEL APPLY 4 GRAMS TO THE AFFECTED AREA FOUR TIMES DAILY 400 g 1   docusate sodium (COLACE) 50 MG capsule Take 50 mg by mouth daily.      hydrochlorothiazide (HYDRODIURIL) 12.5 MG tablet Take one tablet by mouth daily in the morning. 90 tablet 11   hydrOXYzine (ATARAX/VISTARIL) 25 MG tablet Take 50 mg by mouth in the morning, at noon, in the evening, and at bedtime.     LORazepam (ATIVAN) 2 MG tablet Take 2 mg by mouth 4 (four) times daily as needed for anxiety.      losartan (COZAAR) 25 MG tablet TAKE  ONE TABLET BY MOUTH DAILY AT 9AM 30 tablet 11   meclizine (ANTIVERT) 12.5 MG tablet TAKE ONE TABLET BY MOUTH THREE TIMES DAILY AS NEEDED FOR DIZZINESS (VIAL) 90 tablet 11   Multiple Vitamin (MULTIVITAMIN WITH MINERALS) TABS tablet Take 1 tablet by mouth daily.      NARCAN 4 MG/0.1ML LIQD nasal spray kit Place 1 spray into the nose as directed.  0   Norgestimate-Ethinyl Estradiol Triphasic (TRI-ESTARYLLA) 0.18/0.215/0.25 MG-35 MCG tablet Take 1 tablet by mouth daily. 84 tablet 1   ondansetron (ZOFRAN-ODT) 8 MG disintegrating tablet Take 1 tablet (8 mg  total) by mouth every 8 (eight) hours as needed for nausea or vomiting. 8mg  ODT q4 hours prn nausea 20 tablet 0   OneTouch Delica Lancets 33G MISC Use as directed to check 1 time per day dx: e11.65 100 each 3   ONETOUCH VERIO test strip Use as directed to check 1 time per day dx: e11.65 100 each 3   PAIN MANAGEMENT INTRATHECAL, IT, PUMP 1 each by Intrathecal route continuous. Intrathecal (IT) medication: fentanyl, clonidine, bupivicaine  Fentanyl 250mg /bupivicaine150mg /clonidine2mg   51.97mg  per day, 2mg  per day, 1.69mcg 15.81ml/qmin     QUEtiapine (SEROQUEL XR) 400 MG 24 hr tablet Take 400 mg by mouth 2 (two) times daily.     sitaGLIPtin-metformin (JANUMET) 50-1000 MG tablet Take 1 tablet by mouth daily with breakfast. 90 tablet 1   tiZANidine (ZANAFLEX) 4 MG tablet Take 4 mg by mouth every 4 (four) hours.     triamcinolone cream (KENALOG) 0.1 % Apply 1 application topically daily as needed (for itching). 30 g 2   No current facility-administered medications for this visit.   Allergies  Allergen Reactions   Lisinopril Hives    Other reaction(s): anaphylaxis/angioedema   Gabapentin     Other Reaction(s): Nausea and Vomiting, Rash, Nausea and Vomiting, Rash   Latex Hives   Morphine And Codeine     Itching, skin get hot.    Adhesive [Tape] Rash     Review of Systems: All systems reviewed and negative except where noted in HPI.    No  results found.  Physical Exam: BP (!) 176/98   Ht 5' 2.5" (1.588 m)   Wt 124 lb 6 oz (56.4 kg) Comment: verbal  BMI 22.39 kg/m  Constitutional: Pleasant,well-developed, African American female in no acute distress.  Accompanied by daughters.  She is seated in a wheelchair as she is having a bad pain day HEENT: Normocephalic and atraumatic. Conjunctivae are normal. No scleral icterus. Neck supple.  Cardiovascular: Normal rate, regular rhythm.  Pulmonary/chest: Effort normal and breath sounds normal. No wheezing, rales or rhonchi. Abdominal: Soft, nondistended, nontender. Bowel sounds active throughout. There are no masses palpable. No hepatomegaly. Extremities: no edema Neurological: Alert and oriented to person place and time. Skin: Skin is warm and dry. No rashes noted. Psychiatric: Normal mood and affect. Behavior is normal.  CBC    Component Value Date/Time   WBC 7.4 12/03/2022 1747   RBC 4.74 12/03/2022 1747   HGB 12.6 12/03/2022 1747   HGB 13.1 08/20/2022 1532   HCT 39.3 12/03/2022 1747   HCT 41.6 08/20/2022 1532   PLT 401 (H) 12/03/2022 1747   PLT 390 08/20/2022 1532   MCV 82.9 12/03/2022 1747   MCV 85 08/20/2022 1532   MCH 26.6 12/03/2022 1747   MCHC 32.1 12/03/2022 1747   RDW 13.6 12/03/2022 1747   RDW 13.9 08/20/2022 1532   LYMPHSABS 1.8 10/13/2022 1830   MONOABS 0.3 10/13/2022 1830   EOSABS 0.1 10/13/2022 1830   BASOSABS 0.0 10/13/2022 1830    CMP     Component Value Date/Time   NA 139 12/03/2022 1747   NA 140 08/20/2022 1532   K 3.3 (L) 12/03/2022 1747   CL 99 12/03/2022 1747   CO2 30 12/03/2022 1747   GLUCOSE 106 (H) 12/03/2022 1747   BUN 20 12/03/2022 1747   BUN 11 08/20/2022 1532   CREATININE 1.21 (H) 12/03/2022 1747   CALCIUM 9.7 12/03/2022 1747   PROT 7.9 10/13/2022 1830   PROT 7.1 02/04/2022 1721   ALBUMIN 4.5  10/13/2022 1830   ALBUMIN 4.4 02/04/2022 1723   AST 18 10/13/2022 1830   ALT 12 10/13/2022 1830   ALKPHOS 71 10/13/2022 1830    BILITOT 0.6 10/13/2022 1830   BILITOT 0.2 02/04/2022 1721   GFRNONAA 53 (L) 12/03/2022 1747   GFRAA 60 08/24/2020 1551       Latest Ref Rng & Units 12/03/2022    5:47 PM 10/13/2022    6:30 PM 08/26/2022    9:07 PM  CBC EXTENDED  WBC 4.0 - 10.5 K/uL 7.4  7.2  7.7   RBC 3.87 - 5.11 MIL/uL 4.74  5.31  4.29   Hemoglobin 12.0 - 15.0 g/dL 40.9  81.1  91.4   HCT 36.0 - 46.0 % 39.3  43.9  36.0   Platelets 150 - 400 K/uL 401  270  330   NEUT# 1.7 - 7.7 K/uL  4.9    Lymph# 0.7 - 4.0 K/uL  1.8        ASSESSMENT AND PLAN: Assessment and Plan    Dysphagia Long-standing dysphagia since 2016-2017 with symptoms of food and liquids getting stuck at the level of the Adam's apple and chest.  Last dilation over a year ago provided relief for about two months. Normal esophageal manometry in 2021.  Discussed repeating EGD with dilation and referral to [facility] for further evaluation by motility specialists, who may offer additional treatment options beyond repeat dilations.  The patient would like to be evaluated by [facility] but would also like to schedule another EGD with dilation.  Although narcotics have been known to effect esophageal motility, she did not have any dysmotility noted on her manometry. - Repeat EGD with dilation - Refer to [facility] for further evaluation.  Chronic Nausea and Vomiting Persistent daily vomiting for the past three years, preventing food intake. Multiple upper endoscopies have ruled out ulcers and strictures. Chronic oxycodone use may be affecting GI motility. Discussed that narcotics can significantly impact GI function, potentially contributing to symptoms. Further evaluation at [facility] may provide additional insights and treatment options. - Continue Phenergan and Zofran for nausea   Gastroesophageal Reflux Disease (GERD) Daily heartburn and acid regurgitation despite taking Dexilant, indicating severe GERD. Symptoms worsen without medication. Discussed the possibility of further reflux  assessment (impedance/Bravo), elected to defer for now. - Continue Dexilant daily   Chronic Constipation Difficulty with bowel movements, typically going once every two weeks without laxatives. Previous treatments with Linzess and Amitiza were ineffective. Currently using Miralax. Constipation likely related to chronic opioid use.  Discussed trying Relistor, a peripheral opioid antagonist - Start Relistor 450 mg PO once daily - Continue using Miralax as needed  Chronic Pain Chronic pain managed with oxycodone and tizanidine, affecting back, neck, and left side, often requiring bed rest. - Continue current pain management regimen  The details, risks (including bleeding, perforation, infection, missed lesions, medication reactions and possible hospitalization or surgery if complications occur), benefits, and alternatives to EGD with possible biopsy and possible dilation were discussed with the patient and she consents to proceed.   Linda Mercado E. Tomasa Rand, MD [facility]  I spent a total of 45 minutes reviewing the patient's medical record, interviewing and examining the patient, discussing her diagnosis and management of her condition going forward, and documenting in the medical record  [name], FNP

## 2023-05-21 NOTE — Patient Instructions (Signed)
We have sent the following medications to your pharmacy for you to pick up at your convenience: Relistor 450 mg in the morning.  You have been scheduled for an endoscopy. Please follow written instructions given to you at your visit today.  If you use inhalers (even only as needed), please bring them with you on the day of your procedure.  If you take any of the following medications, they will need to be adjusted prior to your procedure:   DO NOT TAKE 7 DAYS PRIOR TO TEST- Trulicity (dulaglutide) Ozempic, Wegovy (semaglutide) Mounjaro (tirzepatide) Bydureon Bcise (exanatide extended release)  DO NOT TAKE 1 DAY PRIOR TO YOUR TEST Rybelsus (semaglutide) Adlyxin (lixisenatide) Victoza (liraglutide) Byetta (exanatide)  _______________________________________________________  If your blood pressure at your visit was 140/90 or greater, please contact your primary care physician to follow up on this.  _______________________________________________________  If you are age 57 or older, your body mass index should be between 23-30. Your Body mass index is 22.39 kg/m. If this is out of the aforementioned range listed, please consider follow up with your Primary Care Provider.  If you are age 66 or younger, your body mass index should be between 19-25. Your Body mass index is 22.39 kg/m. If this is out of the aformentioned range listed, please consider follow up with your Primary Care Provider.   ________________________________________________________  The [place] [facility] providers would like to encourage you to use [facility] to communicate with providers for non-urgent requests or questions.  Due to long hold times on the telephone, sending your provider a message by [facility] may be a faster and more efficient way to get a response.  Please allow 48 business hours for a response.  Please remember that this is for non-urgent requests.   It was a pleasure to see you today!  Thank you for trusting  me with your gastrointestinal care!    Scott E.Tomasa Rand, MD

## 2023-05-22 ENCOUNTER — Other Ambulatory Visit: Payer: Self-pay | Admitting: Nurse Practitioner

## 2023-05-22 ENCOUNTER — Telehealth: Payer: Self-pay | Admitting: Gastroenterology

## 2023-05-22 DIAGNOSIS — K219 Gastro-esophageal reflux disease without esophagitis: Secondary | ICD-10-CM

## 2023-05-22 NOTE — Telephone Encounter (Signed)
Inbound call from patient, stating RELISTOR needs a prior authorization.

## 2023-05-23 ENCOUNTER — Ambulatory Visit: Payer: Self-pay

## 2023-05-23 NOTE — Patient Outreach (Signed)
  Care Coordination   05/23/2023 Name: Linda Mercado MRN: [id number] DOB: 1966/06/04   Care Coordination Outreach Attempts:  An unsuccessful telephone outreach was attempted for a scheduled appointment today.  Follow Up Plan:  Additional outreach attempts will be made to offer the patient care coordination information and services.   Encounter Outcome:  No Answer   Care Coordination Interventions:  No, not indicated    Delsa Sale RN BSN CCM [place]  [facility] Nurse Care Coordinator  Direct Dial: [phone] Website: [email or web address]

## 2023-05-27 ENCOUNTER — Telehealth: Payer: Self-pay

## 2023-05-27 ENCOUNTER — Other Ambulatory Visit (HOSPITAL_COMMUNITY): Payer: Self-pay

## 2023-05-27 NOTE — Telephone Encounter (Signed)
Pharmacy Patient Advocate Encounter   Received notification from Pt Calls Messages that prior authorization for Relistor 150MG  tablets is required/requested.   Insurance verification completed.   The patient is insured through [facility] .   Per test claim: PA required; PA submitted to above mentioned insurance via CoverMyMeds Key/confirmation #/EOC [id number] Status is pending

## 2023-05-27 NOTE — Telephone Encounter (Signed)
PA request has been Submitted. New Encounter created for follow up. For additional info see Pharmacy Prior Auth telephone encounter from 05-27-2023.

## 2023-05-29 ENCOUNTER — Encounter: Payer: Self-pay | Admitting: Gastroenterology

## 2023-06-06 ENCOUNTER — Telehealth: Payer: Self-pay | Admitting: *Deleted

## 2023-06-06 NOTE — Transitions of Care (Post Inpatient/ED Visit) (Signed)
   06/06/2023  Name: Linda Mercado MRN: [id number] DOB: 1966-05-06  Today's TOC FU Call Status: Today's TOC FU Call Status:: Unsuccessful Call (1st Attempt) Unsuccessful Call (1st Attempt) Date: 06/06/23  Attempted to reach the patient regarding the most recent Inpatient/ED visit.  Follow Up Plan: Additional outreach attempts will be made to reach the patient to complete the Transitions of Care (Post Inpatient/ED visit) call.   Irving Shows [facility], BSN RN Care Manager/ Transition of Care [place] [phone]

## 2023-06-09 ENCOUNTER — Telehealth: Payer: Self-pay

## 2023-06-09 ENCOUNTER — Telehealth: Payer: Self-pay | Admitting: Gastroenterology

## 2023-06-09 NOTE — Transitions of Care (Post Inpatient/ED Visit) (Signed)
   06/09/2023  Name: Linda Mercado MRN: [id number] DOB: 1965/12/28  Today's TOC FU Call Status: Today's TOC FU Call Status:: Unsuccessful Call (2nd Attempt) Unsuccessful Call (2nd Attempt) Date: 06/09/23  Attempted to reach the patient regarding the most recent Inpatient/ED visit.  Follow Up Plan: Additional outreach attempts will be made to reach the patient to complete the Transitions of Care (Post Inpatient/ED visit) call.      Antionette Fairy, RN,BSN,CCM RN Care Manager Transitions of Care  [place]  Direct Phone: [phone] Toll Free: [phone] Fax: [phone]

## 2023-06-09 NOTE — Transitions of Care (Post Inpatient/ED Visit) (Signed)
06/09/2023  Name: Linda Mercado MRN: [id number] DOB: Sep 25, 1965  Today's TOC FU Call Status: Today's TOC FU Call Status:: Successful TOC FU Call Completed TOC FU Call Complete Date: 06/09/23 Patient's Name and Date of Birth confirmed.  Transition Care Management Follow-up Telephone Call Date of Discharge: 06/05/23 Discharge Facility: Other ([facility]) Name of Other ([facility]) Discharge Facility: [facility] Type of Discharge: Inpatient Admission Primary Inpatient Discharge Diagnosis:: "sciatica,left side" How have you been since you were released from the hospital?: Same (pt voices she is "doing okay-still having pain" -taking pain med with some relief-going to see pain mgmt MD tomorrrow to further discuss options, she does not want to have surgery) Any questions or concerns?: No  Items Reviewed: Did you receive and understand the discharge instructions provided?: Yes Medications obtained,verified, and reconciled?: Partial Review Completed Reason for Partial Mediation Review: pt is not near meds at present-reviewed med changes with pt Any new allergies since your discharge?: No Dietary orders reviewed?: Yes Type of Diet Ordered:: low salt/heart healthy Do you have support at home?: Yes People in Home: child(ren), adult Name of Support/Comfort Primary Source: daughter helps her out  Medications Reviewed Today: Medications Reviewed Today     Reviewed by Charlyn Minerva, RN (Registered Nurse) on 06/09/23 at 1507  Med List Status: <None>   Medication Order Taking? Sig Documenting Provider Last Dose Status Informant  albuterol (VENTOLIN HFA) 108 (90 Base) MCG/ACT inhaler [id number]  INHALE 2 PUFFS BY MOUTH EVERY 6 HOURS AS NEEDED FOR SHORTNESS OF BREATH AND WHEEZING [name], FNP  Active   amLODipine (NORVASC) 5 MG tablet [id number] Yes Take 5 mg by mouth daily. [provider] Taking Active Self  atenolol (TENORMIN) 100 MG tablet [id number]  Take one  tablet by mouth daily. [name], FNP  Active   atorvastatin (LIPITOR) 10 MG tablet [id number]  Take one tablet by mouth daily at bedtime. [name], FNP  Active   Blood Glucose Monitoring Suppl [facility] VERIO) w/Device Andria Rhein [id number]  Use as directed to check 1 time per day dx: e11.65 [name], FNP  Active   calcium gluconate 500 MG tablet [id number]  Take 1 tablet (500 mg total) by mouth 3 (three) times daily. [name], FNP  Active   clonazePAM Scarlette Calico) 1 MG tablet [id number]  Take 1 mg by mouth 3 (three) times daily. [provider]  Active Multiple Informants  desvenlafaxine (PRISTIQ) 100 MG 24 hr tablet [id number]  Take 100 mg by mouth every morning. [provider]  Active   dexlansoprazole (DEXILANT) 60 MG capsule [id number]  Take 1 capsule (60 mg total) by mouth daily. Patient will need appt for further refills [name], FNP  Active   diclofenac Sodium (VOLTAREN) 1 % GEL [id number]  APPLY 4 GRAMS TO THE AFFECTED AREA FOUR TIMES DAILY [name], FNP  Active   docusate sodium (COLACE) 50 MG capsule [id number]  Take 50 mg by mouth daily.  [provider]  Active Multiple Informants  hydrochlorothiazide (HYDRODIURIL) 12.5 MG tablet [id number]  Take one tablet by mouth daily in the morning. [name], FNP  Active   HYDROcodone-acetaminophen [facility]) 7.5-325 MG tablet [id number] Yes Take 1 tablet by mouth every 6 (six) hours as needed for moderate pain (pain score 4-6) (. Max Daily Amount: 4 tablets). [provider] Taking Active Self  hydrOXYzine (ATARAX/VISTARIL) 25 MG tablet [id number]  Take 50 mg by mouth in the morning, at noon, in the evening, and at bedtime. [provider]  Active Multiple Informants, Self  LORazepam (ATIVAN) 2 MG tablet [id number]  Take 2 mg by mouth 4 (four) times daily as needed for anxiety.  [provider]  Active   losartan (COZAAR) 25 MG tablet [id number]  TAKE ONE TABLET BY MOUTH DAILY AT Bobbye Riggs, [place], FNP  Active   meclizine (ANTIVERT) 12.5 MG tablet [id number]  TAKE ONE TABLET BY MOUTH THREE TIMES DAILY AS NEEDED FOR DIZZINESS (VIAL) [name], FNP  Active   Methylnaltrexone Bromide (RELISTOR) 150 MG TABS [id number]  Take 3 tablets (450 mg total) by mouth in the morning. Jenel Lucks, MD  Active   Multiple Vitamin (MULTIVITAMIN WITH MINERALS) TABS tablet [id number]  Take 1 tablet by mouth daily.  [provider]  Active Multiple Informants  NARCAN 4 MG/0.1ML LIQD nasal spray kit [id number]  Place 1 spray into the nose as directed. [provider]  Active Multiple Informants  Norgestimate-Ethinyl Estradiol Triphasic (TRI-ESTARYLLA) 0.18/0.215/0.25 MG-35 MCG tablet [id number]  Take 1 tablet by mouth daily. [name], FNP  Active   ondansetron (ZOFRAN-ODT) 8 MG disintegrating tablet [id number]  Take 1 tablet (8 mg total) by mouth every 8 (eight) hours as needed for nausea or vomiting. 8mg  ODT q4 hours prn nausea [name], FNP  Active   OneTouch Delica Lancets 33G MISC [id number]  Use as directed to check 1 time per day dx: e11.65 [name], FNP  Active   [facility] VERIO test strip [id number]  Use as directed to check 1 time per day dx: e11.65 [name], FNP  Active   PAIN MANAGEMENT INTRATHECAL, IT, PUMP [id number]  1 each by Intrathecal route continuous. Intrathecal (IT) medication: fentanyl, clonidine, bupivicaine  Fentanyl 250mg /bupivicaine150mg /clonidine2mg   51.97mg  per day, 2mg  per day, 1.58mcg 15.44ml/qmin [provider]  Active Multiple Informants  QUEtiapine (SEROQUEL XR) 400 MG 24 hr tablet [id number]  Take 400 mg by mouth 2 (two) times daily. [provider]  Active Multiple Informants           Med Note Antony Madura, Arn Medal   Wed Mar 18, 2018  7:28 PM)    sitaGLIPtin-metformin (JANUMET) 50-1000 MG tablet [id number]  Take 1 tablet by mouth daily with breakfast. [name], FNP  Active   tiZANidine (ZANAFLEX) 4 MG tablet  [id number]  Take 4 mg by mouth every 4 (four) hours. [provider]  Active Multiple Informants  triamcinolone cream (KENALOG) 0.1 % [id number]  Apply 1 application topically daily as needed (for itching). Rodriguez-Southworth, Nettie Elm, PA-C  Active Multiple Informants            Home Care and Equipment/Supplies: Were Home Health Services Ordered?: Yes Name of Home Health Agency:: [facility] Has Agency set up a time to come to your home?: No (pt's VM full-she is unsure if agency has tired to call her -has a lot of "missed calls"-per pt request she wanted [facility] agency info sent to her via mychart and she will call agency today) Any new equipment or medical supplies ordered?: No  Functional Questionnaire: Do you need assistance with bathing/showering or dressing?: No Do you need assistance with meal preparation?: No Do you need assistance with eating?: No Do you have difficulty maintaining continence: No Do you need assistance with getting out of bed/getting out of a chair/moving?: No Do you have difficulty managing or taking your medications?: No  Follow up appointments reviewed: PCP Follow-up appointment confirmed?: Yes Date of PCP follow-up appointment?: 06/12/23 Follow-up Provider: Dr. Philip Aspen- pt in the process of changing provider-appt  is to establish care Specialist Hospital Follow-up appointment confirmed?: Yes Date of Specialist follow-up appointment?: 06/10/23 Follow-Up Specialty Provider:: pain mgmt MD, discussed with pt that per d/c instructions neurosurgeron office-Dr. Petra Kuba to call and arrange appt with her-advised to call office if she does not hear from them in a few days, pt has GI appt on 06/24/23 Do you need transportation to your follow-up appointment?: No (pt confirms her daughter will take her to appts) Do you understand care options if your condition(s) worsen?: Yes-patient verbalized understanding  SDOH Interventions Today    Flowsheet Row Most  Recent Value  SDOH Interventions   Food Insecurity Interventions Patient Unable to Answer  Housing Interventions Patient Unable to Answer  Transportation Interventions Intervention Not Indicated  [discussed with pt to check with insurance regsrding any benefits/services available]  Utilities Interventions Patient Unable to Answer       TOC interventions discussed/reviewed: -discussed/reviewed insurance/health plans benefits -Provided with RN CM contact info-advised to call for any questions/concerns -Doctor visit discussed/reviewed -PCP -Doctor visits discussed/reviewed-Specialist -Provided Verbal Education: nutrition, pain mgmt, meds & their function, home safety, HH services   Antionette Fairy, RN,BSN,CCM RN Care Manager Transitions of Care  [place]/Population Health  Direct Phone: [phone] Toll Free: [phone] Fax: [phone]

## 2023-06-09 NOTE — Telephone Encounter (Signed)
Inbound call from patient, would like to reschedule her procedure for tomorrow. Patient states she is hospitalized. Patient rescheduled for 12/3 at 11:30 AM.

## 2023-06-10 ENCOUNTER — Encounter: Payer: 59 | Admitting: Gastroenterology

## 2023-06-10 NOTE — Telephone Encounter (Signed)
This encounter was created in error - please disregard.

## 2023-06-12 ENCOUNTER — Other Ambulatory Visit: Payer: Self-pay | Admitting: Gastroenterology

## 2023-06-12 ENCOUNTER — Ambulatory Visit: Payer: 59 | Admitting: Internal Medicine

## 2023-06-12 DIAGNOSIS — K219 Gastro-esophageal reflux disease without esophagitis: Secondary | ICD-10-CM

## 2023-06-17 ENCOUNTER — Ambulatory Visit: Payer: Self-pay

## 2023-06-17 NOTE — Patient Outreach (Signed)
  Care Coordination   Follow Up Visit Note   06/17/2023 Name: Linda Mercado MRN: [id number] DOB: 05-17-1966  Linda Mercado is a 57 y.o. year old female who sees Linda Mercado, Linda Score, MD for primary care. I spoke with  Linda Mercado by phone today.  What matters to the patients health and wellness today?  Pain management    Goals Addressed               This Visit's Progress     COMPLETED: Care Coordination Activities-No follow up required (pt-stated)        Care Coordination Interventions: Evaluation of current treatment plan related to Chronic pain management and HTN Management and patient's adherence to plan as established by provider  Active listening / Reflection utilized  Emotional Support Provided  Spoke with patient. She reports she deals with pain constantly.  Sees pain management in [facility] Mercado.  She has pain pump.  Pain today 10/10 but she manages.  She also has depression.  Currently on pristiq.  She lives alone with limited support.  Discussed [facility]. She is in agreement.  She sees Linda Mercado with Linda Mercado at [place].  RN CM made referral to [facility], spoke with Linda Mercado to get patient scheduled with [facility].          SDOH assessments and interventions completed:  Yes  SDOH Interventions Today    Flowsheet Row Most Recent Value  SDOH Interventions   Food Insecurity Interventions Intervention Not Indicated  Housing Interventions Intervention Not Indicated  Transportation Interventions Intervention Not Indicated  Utilities Interventions Intervention Not Indicated  Depression Interventions/Treatment  Currently on Treatment        Care Coordination Interventions:  Yes, provided   Follow up plan: No further intervention required.   Encounter Outcome:  Patient Visit Completed   Linda Leriche, RN, MSN RN Care Manager [facility], Population Health Direct Dial: [phone]   Fax: [phone] Website: [email or web address]

## 2023-06-17 NOTE — Patient Instructions (Signed)
Visit Information  Thank you for taking time to visit with me today. Please don't hesitate to contact me if I can be of assistance to you.   Following are the goals we discussed today:   Goals Addressed               This Visit's Progress     COMPLETED: Care Coordination Activities-No follow up required (pt-stated)        Care Coordination Interventions: Evaluation of current treatment plan related to Chronic pain management and HTN Management and patient's adherence to plan as established by provider  Active listening / Reflection utilized  Emotional Support Provided  Spoke with patient. She reports she deals with pain constantly.  Se[facility] in [facility] Delrae Rend.  She has pain pump.  Pain today 10/10 but she manages.  She also has depression.  Currently on pristiq.  She lives alone with limited support.  Discussed [facility]. She is in agreement.  She sees [name] with [facility] at [place].  RN CM made referral to [facility], spoke with Lelon Mast to get patient scheduled with [facility].          If you are experiencing a Mental Health or Behavioral Health Crisis or need someone to talk to, please call the Suicide and Crisis Lifeline: 988   Patient verbalizes understanding of instructions and care plan provided today and agrees to view in MyChart. Active MyChart status and patient understanding of how to access instructions and care plan via MyChart confirmed with patient.     The patient has been provided with contact information for the care management team and has been advised to call with any health related questions or concerns.   Bary Leriche, RN, MSN RN Care Manager [facility], Population Health Direct Dial: [phone]  Fax: [phone] Website: [email or web address]

## 2023-06-23 NOTE — Telephone Encounter (Signed)
Pharmacy Patient Advocate Encounter  Received notification from [facility] that Prior Authorization for Relistor 150MG  tablets has been DENIED.  Full denial letter will be uploaded to the media tab. See denial reason below.  Medication authorization requires the following: You have a trial and failure, contraindication, or intolerance to both Movantik and an osmotic laxative [for example: Constulose (lactulose)]  Please note: Covered drug(s) may require prior authorization  PA #/Case ID/Reference #: [id number]

## 2023-06-24 ENCOUNTER — Encounter: Payer: 59 | Admitting: Gastroenterology

## 2023-06-24 NOTE — Telephone Encounter (Signed)
New instruction send to patient via My Chart and patient informed via phone. She verbalized understanding.

## 2023-06-24 NOTE — Telephone Encounter (Signed)
Patient rescheduled procedure for 12/18. Please update prep instructions

## 2023-06-24 NOTE — Telephone Encounter (Signed)
Hi Dr Tomasa Rand,   I called patient regarding her procedure appointment she stated she completely forgot about the appointment, but will call back to reschedule. I will no show her for today.   Thank you

## 2023-07-09 ENCOUNTER — Encounter: Payer: Self-pay | Admitting: Gastroenterology

## 2023-07-09 ENCOUNTER — Ambulatory Visit: Payer: 59 | Admitting: Gastroenterology

## 2023-07-09 VITALS — BP 131/77 | HR 60 | Temp 96.8°F | Resp 13 | Ht 62.0 in | Wt 124.0 lb

## 2023-07-09 DIAGNOSIS — R1310 Dysphagia, unspecified: Secondary | ICD-10-CM | POA: Diagnosis not present

## 2023-07-09 MED ORDER — SODIUM CHLORIDE 0.9 % IV SOLN: 500.0000 mL | Freq: Once | INTRAVENOUS | Status: AC

## 2023-07-09 MED ORDER — SODIUM CHLORIDE 0.9 % IV SOLN
4.0000 mg | Freq: Once | INTRAVENOUS | Status: AC
  Administered 2023-07-09: 4 mg via INTRAVENOUS

## 2023-07-09 NOTE — Progress Notes (Signed)
Called to room to assist during endoscopic procedure.  Patient ID and intended procedure confirmed with present staff. Received instructions for my participation in the procedure from the performing physician.  

## 2023-07-09 NOTE — Progress Notes (Signed)
Pt's states no medical or surgical changes since previsit or office visit. 

## 2023-07-09 NOTE — Patient Instructions (Addendum)
Recommendation:  - Patient has a contact number available for emergencies. The signs and symptoms of potential delayed complications were discussed with the patient. Return to normal activities tomorrow. Written discharge instructions were provided to the patient.  - Soft diet today. Advance to regular tomorrow if not experiencing pain with swallowing.  - Continue present medications.  - Follow up with [facility] for further evaluation/ treatment of refractory dysphagia symptoms.     YOU HAD AN ENDOSCOPIC PROCEDURE TODAY AT THE [facility]:   Refer to the procedure report that was given to you for any specific questions about what was found during the examination.  If the procedure report does not answer your questions, please call your gastroenterologist to clarify.  If you requested that your care partner not be given the details of your procedure findings, then the procedure report has been included in a sealed envelope for you to review at your convenience later.  YOU SHOULD EXPECT: Some feelings of bloating in the abdomen. Passage of more gas than usual.  Walking can help get rid of the air that was put into your GI tract during the procedure and reduce the bloating. If you had a lower endoscopy (such as a colonoscopy or flexible sigmoidoscopy) you may notice spotting of blood in your stool or on the toilet paper. If you underwent a bowel prep for your procedure, you may not have a normal bowel movement for a few days.  Please Note:  You might notice some irritation and congestion in your nose or some drainage.  This is from the oxygen used during your procedure.  There is no need for concern and it should clear up in a day or so.  SYMPTOMS TO REPORT IMMEDIATELY:   Following upper endoscopy (EGD)  Vomiting of blood or coffee ground material  New chest pain or pain under the shoulder blades  Painful or persistently difficult swallowing  New shortness of breath  Fever of 100F or  higher  Black, tarry-looking stools  For urgent or emergent issues, a gastroenterologist can be reached at any hour by calling [phone]. Do not use MyChart messaging for urgent concerns.    DIET:  Follow a POST DILATION DIET (see handout): Nothing to eat or drink until 11:00am, then CLEAR LIQUIDS ONLY from 11:00am to 12:00pm. Then Follow a SOFT DIET today (see handout), but then you may proceed to your regular diet as tolerated tomorrow morning.  Drink plenty of fluids but you should avoid alcoholic beverages for 24 hours.  MEDICATIONS: Continue present medications. A new order and RF for your Dexilant has been sent to your pharmacy of choice. Also RX for Viscous lidocain 2%, total. Use 10ml every 4 hours AS NEEDED for throat discomfort. RX called to [facility] in [place] per patient and daughter's request, spoke with pharmacist Marily Memos.  Please see handouts given to you by your recovery nurse: esophageal stricture and post-dilation diet.  FOLLOW UP: Follow up with [facility] for further evaluation/treatment of refractory dysphagia symptoms.  Thank you for allowing [place] to provide for your healthcare needs today.  ACTIVITY:  You should plan to take it easy for the rest of today and you should NOT DRIVE or use heavy machinery until tomorrow (because of the sedation medicines used during the test).    FOLLOW UP: Our staff will call the number listed on your records the next business day following your procedure.  We will call around 7:15- 8:00 am to check on you and address  any questions or concerns that you may have regarding the information given to you following your procedure. If we do not reach you, we will leave a message.     If any biopsies were taken you will be contacted by phone or by letter within the next 1-3 weeks.  Please call us at [phone] if you have not heard about the biopsies in 3 weeks.    SIGNATURES/CONFIDENTIALITY: You and/or your care partner have signed paperwork  which will be entered into your electronic medical record.  These signatures attest to the fact that that the information above on your After Visit Summary has been reviewed and is understood.  Full responsibility of the confidentiality of this discharge information lies with you and/or your care-partner.

## 2023-07-09 NOTE — Progress Notes (Signed)
Sedate, gd SR, tolerated procedure well, VSS, report to RN 

## 2023-07-09 NOTE — Progress Notes (Signed)
[employer] Implant date 01/01/2023. For pain.

## 2023-07-09 NOTE — Progress Notes (Signed)
[facility] History and Physical   Primary Care Physician:  Shon Hale, MD   Reason for Procedure:   Dysphagia  Plan:    EGD with dilation     HPI: Linda Mercado is a 57 y.o. female undergoing repeat EGD with dilation.  She has undergone numerous dilations in the past with Dr. Elnoria Howard, most recently in Aug 2023 in which an 18 mm Savary was used without mucosal rent formation.  She also has chronic symptoms of nausea and vomiting and GERD symptoms.  She has chronic pain and narcotic dependency.  Past Medical History:  Diagnosis Date   Acid reflux    Bradycardia    Chronic back pain    Diabetes mellitus without complication (HCC)    "prediabetes", A1C down to 5 - reported on 10/11/21   Hypertension    Hypokalemia     Past Surgical History:  Procedure Laterality Date   APPENDECTOMY     BACK SURGERY     x6   CESAREAN SECTION     x3   COLONOSCOPY  01/2022   Dr Elnoria Howard. was told 10 year repeat   ESOPHAGEAL MANOMETRY N/A 08/25/2019   Procedure: ESOPHAGEAL MANOMETRY (EM);  Surgeon: Jeani Hawking, MD;  Location: [facility];  Service: Endoscopy;  Laterality: N/A;   ESOPHAGOGASTRODUODENOSCOPY     Dr Elnoria Howard   HERNIA REPAIR     NECK SURGERY  04/08/2023   x1 and about to have another   Nerve Block Procedure     C6   Pump surgery      Prior to Admission medications   Medication Sig Start Date End Date Taking? Authorizing Provider  albuterol (VENTOLIN HFA) 108 (90 Base) MCG/ACT inhaler INHALE 2 PUFFS BY MOUTH EVERY 6 HOURS AS NEEDED FOR SHORTNESS OF BREATH AND WHEEZING 02/24/23  Yes [name], FNP  amLODipine (NORVASC) 5 MG tablet Take 5 mg by mouth daily.   Yes [provider]  atenolol (TENORMIN) 100 MG tablet Take one tablet by mouth daily. 12/27/22  Yes [name], FNP  atorvastatin (LIPITOR) 10 MG tablet Take one tablet by mouth daily at bedtime. 12/27/22  Yes [name], FNP  Blood Glucose Monitoring Suppl (ONETOUCH VERIO) w/Device KIT Use as  directed to check 1 time per day dx: e11.65 02/07/21  Yes [name], FNP  BREO ELLIPTA 100-25 MCG/ACT AEPB Inhale 1 puff into the lungs daily. 06/16/23  Yes [provider]  clonazePAM (KLONOPIN) 1 MG tablet Take 1 mg by mouth 3 (three) times daily.   Yes [provider]  desvenlafaxine (PRISTIQ) 100 MG 24 hr tablet Take 100 mg by mouth every morning. 08/07/22  Yes [provider]  diclofenac Sodium (VOLTAREN) 1 % GEL APPLY 4 GRAMS TO THE AFFECTED AREA FOUR TIMES DAILY 05/22/20  Yes [name], FNP  hydrochlorothiazide (HYDRODIURIL) 12.5 MG tablet Take one tablet by mouth daily in the morning. 12/27/22  Yes [name], FNP  HYDROcodone-acetaminophen (NORCO) 7.5-325 MG tablet Take 1 tablet by mouth every 6 (six) hours as needed for moderate pain (pain score 4-6) (. Max Daily Amount: 4 tablets).   Yes [provider]  hydrOXYzine (ATARAX/VISTARIL) 25 MG tablet Take 50 mg by mouth in the morning, at noon, in the evening, and at bedtime.   Yes [provider]  losartan (COZAAR) 25 MG tablet TAKE ONE TABLET BY MOUTH DAILY AT 9AM 12/27/22  Yes [name], FNP  meclizine (ANTIVERT) 12.5 MG tablet TAKE ONE TABLET BY MOUTH THREE TIMES  DAILY AS NEEDED FOR DIZZINESS (VIAL) 12/27/22  Yes [name], FNP  Methylnaltrexone Bromide (RELISTOR) 150 MG TABS Take 3 tablets (450 mg total) by mouth in the morning. 05/21/23  Yes Jenel Lucks, MD  Multiple Vitamin (MULTIVITAMIN WITH MINERALS) TABS tablet Take 1 tablet by mouth daily.    Yes [provider]  Norgestimate-Ethinyl Estradiol Triphasic (TRI-ESTARYLLA) 0.18/0.215/0.25 MG-35 MCG tablet Take 1 tablet by mouth daily. 12/27/22  Yes [name], FNP  ondansetron (ZOFRAN-ODT) 8 MG disintegrating tablet Take 1 tablet (8 mg total) by mouth every 8 (eight) hours as needed for nausea or vomiting. 8mg  ODT q4 hours prn nausea 02/19/22  Yes [name], FNP  OneTouch Delica Lancets 33G MISC Use as directed to  check 1 time per day dx: e11.65 02/07/21  Yes [name], FNP  [facility] VERIO test strip Use as directed to check 1 time per day dx: e11.65 02/07/21  Yes [name], FNP  promethazine (PHENERGAN) 12.5 MG tablet Take 12.5 mg by mouth every 8 (eight) hours as needed. 04/28/23  Yes [provider]  QUEtiapine (SEROQUEL XR) 400 MG 24 hr tablet Take 400 mg by mouth 2 (two) times daily.   Yes [provider]  sitaGLIPtin-metformin (JANUMET) 50-1000 MG tablet Take 1 tablet by mouth daily with breakfast. 12/27/22  Yes [name], FNP  tiZANidine (ZANAFLEX) 4 MG tablet Take 4 mg by mouth every 4 (four) hours. 08/18/19  Yes [provider]  triamcinolone cream (KENALOG) 0.1 % Apply 1 application topically daily as needed (for itching). 03/08/19  Yes Rodriguez-Southworth, Nettie Elm, PA-C  VRAYLAR 6 MG CAPS Take 1 capsule by mouth daily. 04/22/23  Yes [provider]  buPROPion ER (WELLBUTRIN SR) 100 MG 12 hr tablet Take 100 mg by mouth every morning. Patient not taking: Reported on 07/09/2023 01/30/23   [provider]  calcium gluconate 500 MG tablet Take 1 tablet (500 mg total) by mouth 3 (three) times daily. Patient not taking: Reported on 07/09/2023 01/30/21   [name], FNP  dexlansoprazole (DEXILANT) 60 MG capsule Take 1 capsule (60 mg total) by mouth daily. Patient will need appt for further refills 12/27/22   [name], FNP  docusate sodium (COLACE) 50 MG capsule Take 50 mg by mouth daily.  Patient not taking: Reported on 07/09/2023    [provider]  LORazepam (ATIVAN) 2 MG tablet Take 2 mg by mouth 4 (four) times daily as needed for anxiety.  Patient not taking: Reported on 07/09/2023    [provider]  NARCAN 4 MG/0.1ML LIQD nasal spray kit Place 1 spray into the nose as directed. 01/19/18   [provider]  OLANZapine (ZYPREXA) 15 MG tablet Take by mouth. Patient not taking: Reported on 07/09/2023 01/30/23   [provider]  PAIN MANAGEMENT INTRATHECAL, IT, PUMP 1 each by Intrathecal route continuous. Intrathecal (IT) medication: fentanyl, clonidine, bupivicaine  Fentanyl 250mg /bupivicaine150mg /clonidine2mg   51.97mg  per day, 2mg  per day, 1.72mcg 15.41ml/qmin    [provider]  QUEtiapine (SEROQUEL) 400 MG tablet  06/23/23   [provider]    Current Outpatient Medications  Medication Sig Dispense Refill   albuterol (VENTOLIN HFA) 108 (90 Base) MCG/ACT inhaler INHALE 2 PUFFS BY MOUTH EVERY 6 HOURS AS NEEDED FOR SHORTNESS OF BREATH AND WHEEZING 6.7 g 10   amLODipine (NORVASC) 5 MG tablet Take 5 mg by mouth daily.     atenolol (TENORMIN) 100 MG tablet Take one tablet by mouth daily. 30 tablet 11   atorvastatin (  LIPITOR) 10 MG tablet Take one tablet by mouth daily at bedtime. 90 tablet 11   Blood Glucose Monitoring Suppl (ONETOUCH VERIO) w/Device KIT Use as directed to check 1 time per day dx: e11.65 1 kit 1   BREO ELLIPTA 100-25 MCG/ACT AEPB Inhale 1 puff into the lungs daily.     clonazePAM (KLONOPIN) 1 MG tablet Take 1 mg by mouth 3 (three) times daily.     desvenlafaxine (PRISTIQ) 100 MG 24 hr tablet Take 100 mg by mouth every morning.     diclofenac Sodium (VOLTAREN) 1 % GEL APPLY 4 GRAMS TO THE AFFECTED AREA FOUR TIMES DAILY 400 g 1   hydrochlorothiazide (HYDRODIURIL) 12.5 MG tablet Take one tablet by mouth daily in the morning. 90 tablet 11   HYDROcodone-acetaminophen (NORCO) 7.5-325 MG tablet Take 1 tablet by mouth every 6 (six) hours as needed for moderate pain (pain score 4-6) (. Max Daily Amount: 4 tablets).     hydrOXYzine (ATARAX/VISTARIL) 25 MG tablet Take 50 mg by mouth in the morning, at noon, in the evening, and at bedtime.     losartan (COZAAR) 25 MG tablet TAKE ONE TABLET BY MOUTH DAILY AT 9AM 30 tablet 11   meclizine (ANTIVERT) 12.5 MG tablet TAKE ONE TABLET BY MOUTH THREE TIMES DAILY AS NEEDED FOR DIZZINESS (VIAL) 90 tablet 11   Methylnaltrexone Bromide  (RELISTOR) 150 MG TABS Take 3 tablets (450 mg total) by mouth in the morning. 90 tablet 3   Multiple Vitamin (MULTIVITAMIN WITH MINERALS) TABS tablet Take 1 tablet by mouth daily.      Norgestimate-Ethinyl Estradiol Triphasic (TRI-ESTARYLLA) 0.18/0.215/0.25 MG-35 MCG tablet Take 1 tablet by mouth daily. 84 tablet 1   ondansetron (ZOFRAN-ODT) 8 MG disintegrating tablet Take 1 tablet (8 mg total) by mouth every 8 (eight) hours as needed for nausea or vomiting. 8mg  ODT q4 hours prn nausea 20 tablet 0   OneTouch Delica Lancets 33G MISC Use as directed to check 1 time per day dx: e11.65 100 each 3   ONETOUCH VERIO test strip Use as directed to check 1 time per day dx: e11.65 100 each 3   promethazine (PHENERGAN) 12.5 MG tablet Take 12.5 mg by mouth every 8 (eight) hours as needed.     QUEtiapine (SEROQUEL XR) 400 MG 24 hr tablet Take 400 mg by mouth 2 (two) times daily.     sitaGLIPtin-metformin (JANUMET) 50-1000 MG tablet Take 1 tablet by mouth daily with breakfast. 90 tablet 1   tiZANidine (ZANAFLEX) 4 MG tablet Take 4 mg by mouth every 4 (four) hours.     triamcinolone cream (KENALOG) 0.1 % Apply 1 application topically daily as needed (for itching). 30 g 2   VRAYLAR 6 MG CAPS Take 1 capsule by mouth daily.     buPROPion ER (WELLBUTRIN SR) 100 MG 12 hr tablet Take 100 mg by mouth every morning. (Patient not taking: Reported on 07/09/2023)     calcium gluconate 500 MG tablet Take 1 tablet (500 mg total) by mouth 3 (three) times daily. (Patient not taking: Reported on 07/09/2023) 270 tablet 1   dexlansoprazole (DEXILANT) 60 MG capsule Take 1 capsule (60 mg total) by mouth daily. Patient will need appt for further refills 90 capsule 1   docusate sodium (COLACE) 50 MG capsule Take 50 mg by mouth daily.  (Patient not taking: Reported on 07/09/2023)     LORazepam (ATIVAN) 2 MG tablet Take 2 mg by mouth 4 (four) times daily as needed for anxiety.  (Patient  not taking: Reported on 07/09/2023)     NARCAN 4  MG/0.1ML LIQD nasal spray kit Place 1 spray into the nose as directed.  0   OLANZapine (ZYPREXA) 15 MG tablet Take by mouth. (Patient not taking: Reported on 07/09/2023)     PAIN MANAGEMENT INTRATHECAL, IT, PUMP 1 each by Intrathecal route continuous. Intrathecal (IT) medication: fentanyl, clonidine, bupivicaine  Fentanyl 250mg /bupivicaine150mg /clonidine2mg   51.97mg  per day, 2mg  per day, 1.75mcg 15.72ml/qmin     QUEtiapine (SEROQUEL) 400 MG tablet      Current Facility-Administered Medications  Medication Dose Route Frequency Provider Last Rate Last Admin   0.9 %  sodium chloride infusion  500 mL Intravenous Once Jenel Lucks, MD        Allergies as of 07/09/2023 - Review Complete 07/09/2023  Allergen Reaction Noted   Lisinopril Hives 11/02/2012   Gabapentin  07/07/2013   Latex Hives 03/18/2018   Morphine and codeine  10/06/2019   Adhesive [tape] Rash 03/18/2018    Family History  Problem Relation Age of Onset   Kidney disease Mother    Hypertension Mother    Diabetes Father    Hypertension Father    Bipolar disorder Sister    Schizophrenia Sister    Neuropathy Sister    Hypertension Sister    Bipolar disorder Brother    Bipolar disorder Brother    Schizophrenia Brother    Breast cancer Paternal Grandmother    Bipolar disorder Daughter    Post-traumatic stress disorder Daughter    Asthma Daughter    Depression Daughter    Bipolar disorder Daughter    Post-traumatic stress disorder Daughter    Asthma Daughter    Depression Daughter    Asthma Daughter    Depression Daughter    Asthma Daughter    Stroke Neg Hx    Cancer Neg Hx    CAD Neg Hx    Colon cancer Neg Hx    Esophageal cancer Neg Hx     Social History   Socioeconomic History   Marital status: Divorced    Spouse name: Not on file   Number of children: Not on file   Years of education: Not on file   Highest education level: Not on file  Occupational History   Occupation: disability  Tobacco  Use   Smoking status: Never   Smokeless tobacco: Never  Vaping Use   Vaping status: Never Used  Substance and Sexual Activity   Alcohol use: Not Currently   Drug use: Never   Sexual activity: Yes  Other Topics Concern   Not on file  Social History Narrative   ** Merged History Encounter **       Caffeine- none   Social Drivers of Corporate investment banker Strain: Low Risk  (10/09/2022)   Overall Financial Resource Strain (CARDIA)    Difficulty of Paying Living Expenses: Not hard at all  Food Insecurity: No Food Insecurity (06/17/2023)   Hunger Vital Sign    Worried About Running Out of Food in the Last Year: Never true    Ran Out of Food in the Last Year: Never true  Recent Concern: Food Insecurity - Food Insecurity Present (06/01/2023)   Received [facility]   Hunger Vital Sign    Worried About Running Out of Food in the Last Year: Sometimes true    Ran Out of Food in the Last Year: Never true  Transportation Needs: No Transportation Needs (06/17/2023)   PRAPARE - Transportation  Lack of Transportation (Medical): No    Lack of Transportation (Non-Medical): No  Physical Activity: Inactive (10/09/2022)   Exercise Vital Sign    Days of Exercise per Week: 0 days    Minutes of Exercise per Session: 0 min  Stress: Stress Concern Present (06/01/2023)   Received from [facility] - Occupational Stress Questionnaire    Feeling of Stress : To some extent  Social Connections: Unknown (11/22/2021)   Received from [facility]   Social Network    Social Network: Not on file  Intimate Partner Violence: Patient Unable To Answer (06/09/2023)   Humiliation, Afraid, Rape, and Kick questionnaire    Fear of Current or Ex-Partner: Patient unable to answer    Emotionally Abused: Patient unable to answer    Physically Abused: Patient unable to answer    Sexually Abused: Patient unable to answer    Review of  Systems:  All other review of systems negative except as mentioned in the HPI.  Physical Exam: Vital signs BP (!) 199/99   Pulse 62   Temp (!) 96.8 F (36 C)   Ht 5\' 2"  (1.575 m)   Wt 124 lb (56.2 kg)   SpO2 100%   BMI 22.68 kg/m   General:   Alert,  Well-developed, well-nourished, pleasant and cooperative in NAD Airway:  Mallampati 2 Lungs:  Clear throughout to auscultation.   Heart:  Regular rate and rhythm; no murmurs, clicks, rubs,  or gallops. Abdomen:  Soft, nontender and nondistended. Normal bowel sounds.   Neuro/Psych:  Normal mood and affect. A and O x 3   Mylie Mccurley E. Tomasa Rand, MD [facility]

## 2023-07-09 NOTE — Op Note (Signed)
[facility] Patient Name: Linda Mercado Procedure Date: 07/09/2023 9:25 AM MRN: [id number] Endoscopist: Lorin Picket E. Tomasa Rand , MD, [id number] Age: 57 Referring MD:  Date of Birth: [birth year] Gender: Female Account #: [id number] Procedure:                Upper GI endoscopy Indications:              Dysphagia, history of numerous dilations, last in                            Aug 2023 with 18mm Savary Medicines:                Monitored Anesthesia Care Procedure:                Pre-Anesthesia Assessment:                           - Prior to the procedure, a History and Physical                            was performed, and patient medications and                            allergies were reviewed. The patient's tolerance of                            previous anesthesia was also reviewed. The risks                            and benefits of the procedure and the sedation                            options and risks were discussed with the patient.                            All questions were answered, and informed consent                            was obtained. Prior Anticoagulants: The patient has                            taken no anticoagulant or antiplatelet agents. ASA                            Grade Assessment: II - A patient with mild systemic                            disease. After reviewing the risks and benefits,                            the patient was deemed in satisfactory condition to                            undergo the procedure.  After obtaining informed consent, the endoscope was                            passed under direct vision. Throughout the                            procedure, the patient's blood pressure, pulse, and                            oxygen saturations were monitored continuously. The                            GIF [id number] #[id number] was introduced through the                            mouth, and advanced to  the second part of duodenum.                            The upper GI endoscopy was accomplished without                            difficulty. The patient tolerated the procedure                            well. Scope In: Scope Out: Findings:                 The examined portions of the nasopharynx,                            oropharynx and larynx were normal.                           The examined esophagus was normal. A guidewire was                            placed and the scope was withdrawn. Dilation was                            performed with a Savary dilator with mild                            resistance at 18 mm. The dilation site was examined                            following endoscope reinsertion and showed mild                            mucosal disruption at the UES.                           The entire examined stomach was normal.                           The examined duodenum was normal. Complications:  No immediate complications. Estimated Blood Loss:     Estimated blood loss was minimal. Impression:               - The examined portions of the nasopharynx,                            oropharynx and larynx were normal.                           - Normal esophagus. Dilated.                           - Normal stomach.                           - Normal examined duodenum.                           - No specimens collected. Recommendation:           - Patient has a contact number available for                            emergencies. The signs and symptoms of potential                            delayed complications were discussed with the                            patient. Return to normal activities tomorrow.                            Written discharge instructions were provided to the                            patient.                           - Soft diet today. Advance to regular tomorrow if                            not experiencing pain with  swallowing.                           - Continue present medications.                           - Follow up with [facility] for further                            evaluation/treatment of refractory dysphagia                            symptoms. Molly Savarino E. Tomasa Rand, MD 07/09/2023 9:54:58 AM This report has been signed electronically.

## 2023-07-10 ENCOUNTER — Telehealth: Payer: Self-pay

## 2023-07-10 NOTE — Telephone Encounter (Signed)
LMOM

## 2023-07-22 ENCOUNTER — Other Ambulatory Visit: Payer: Self-pay | Admitting: Nurse Practitioner

## 2023-08-05 ENCOUNTER — Ambulatory Visit: Payer: 59 | Admitting: Internal Medicine

## 2023-08-16 ENCOUNTER — Other Ambulatory Visit: Payer: Self-pay | Admitting: Nurse Practitioner

## 2023-08-16 ENCOUNTER — Other Ambulatory Visit: Payer: Self-pay | Admitting: Gastroenterology

## 2023-08-16 DIAGNOSIS — K219 Gastro-esophageal reflux disease without esophagitis: Secondary | ICD-10-CM

## 2023-09-22 ENCOUNTER — Other Ambulatory Visit: Payer: Self-pay | Admitting: Nurse Practitioner

## 2023-11-19 ENCOUNTER — Other Ambulatory Visit: Payer: Self-pay | Admitting: Nurse Practitioner

## 2023-12-24 ENCOUNTER — Ambulatory Visit: Admitting: Gastroenterology

## 2024-03-05 ENCOUNTER — Ambulatory Visit: Admitting: Gastroenterology

## 2024-05-04 NOTE — Progress Notes (Signed)
 Linda Mercado                                          MRN: [id number]   05/04/2024   The [facility] Quality Team Specialist reviewed this patient medical record for the purposes of chart review for care gap closure. The following were reviewed: abstraction for care gap closure-kidney health evaluation for diabetes:eGFR  and uACR.    [facility]BCI Quality Team

## 2024-05-10 ENCOUNTER — Ambulatory Visit: Admitting: Gastroenterology
